# Patient Record
Sex: Female | Born: 1985 | Race: White | Hispanic: No | Marital: Married | Smoking: Never smoker
Health system: Southern US, Community
[De-identification: ages and names within clinical notes are randomized; demographics above are authoritative.]

## PROBLEM LIST (undated history)

## (undated) ENCOUNTER — Inpatient Hospital Stay (HOSPITAL_COMMUNITY): Payer: Self-pay

## (undated) DIAGNOSIS — R569 Unspecified convulsions: Secondary | ICD-10-CM

## (undated) DIAGNOSIS — Z91148 Patient's other noncompliance with medication regimen for other reason: Secondary | ICD-10-CM

## (undated) DIAGNOSIS — Z9114 Patient's other noncompliance with medication regimen: Secondary | ICD-10-CM

## (undated) DIAGNOSIS — I639 Cerebral infarction, unspecified: Secondary | ICD-10-CM

## (undated) DIAGNOSIS — E119 Type 2 diabetes mellitus without complications: Secondary | ICD-10-CM

## (undated) NOTE — *Deleted (*Deleted)
.   Code stroke called at 0925 by OB Anyanwu. Pt was last know well at 0900. She had witnessed change (slurred speech and rt sided weakness) at that time. She is one day post op C section.She is diabetic. SRN and RRRN to bedside at [phone]. Pt drowsy, but following commands. She is weak in the right side and speech is slightly slurred. Total NIHSS 6 see flowsheet for details. Pt taken to CT #1 at 0933 CBG obtained(118) Non contrast CT negative for acute hemorrhage per Dr Derry Lory.Pt is not a candidate for TPA due to surgery yesterday. At [phone] obtaining additional IV access and obtaining BP (145/90). MRI stat ordered instead of CTA due to pt's shellfish allergy. To MRI at 0950. Taken immediately into open scanner. At 1005 MRI was read by Dr Derry Lory to be negative for a stroke, and code stroke was cancelled.

---
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
## 2006-05-15 ENCOUNTER — Ambulatory Visit: Payer: Self-pay | Admitting: Family Medicine

## 2006-05-16 ENCOUNTER — Encounter (INDEPENDENT_AMBULATORY_CARE_PROVIDER_SITE_OTHER): Payer: Self-pay | Admitting: Family Medicine

## 2006-06-05 ENCOUNTER — Other Ambulatory Visit: Admission: RE | Admit: 2006-06-05 | Discharge: 2006-06-05 | Payer: Self-pay | Admitting: Family Medicine

## 2006-06-05 ENCOUNTER — Ambulatory Visit: Payer: Self-pay | Admitting: Family Medicine

## 2006-06-05 ENCOUNTER — Encounter (INDEPENDENT_AMBULATORY_CARE_PROVIDER_SITE_OTHER): Payer: Self-pay | Admitting: Family Medicine

## 2006-06-07 ENCOUNTER — Ambulatory Visit (HOSPITAL_COMMUNITY): Admission: RE | Admit: 2006-06-07 | Discharge: 2006-06-07 | Payer: Self-pay | Admitting: Family Medicine

## 2006-06-26 ENCOUNTER — Ambulatory Visit: Payer: Self-pay | Admitting: Family Medicine

## 2006-10-24 ENCOUNTER — Ambulatory Visit: Payer: Self-pay | Admitting: Family Medicine

## 2007-07-12 ENCOUNTER — Encounter (INDEPENDENT_AMBULATORY_CARE_PROVIDER_SITE_OTHER): Payer: Self-pay | Admitting: Family Medicine

## 2008-11-05 ENCOUNTER — Emergency Department (HOSPITAL_COMMUNITY): Admission: EM | Admit: 2008-11-05 | Discharge: 2008-11-05 | Payer: Self-pay | Admitting: Emergency Medicine

## 2009-03-13 ENCOUNTER — Emergency Department (HOSPITAL_COMMUNITY): Admission: EM | Admit: 2009-03-13 | Discharge: 2009-03-13 | Payer: Self-pay | Admitting: Emergency Medicine

## 2009-04-04 ENCOUNTER — Emergency Department (HOSPITAL_COMMUNITY): Admission: EM | Admit: 2009-04-04 | Discharge: 2009-04-05 | Payer: Self-pay | Admitting: Emergency Medicine

## 2009-04-20 ENCOUNTER — Emergency Department (HOSPITAL_COMMUNITY): Admission: EM | Admit: 2009-04-20 | Discharge: 2009-04-20 | Payer: Self-pay | Admitting: Family Medicine

## 2009-12-09 ENCOUNTER — Emergency Department (HOSPITAL_COMMUNITY): Admission: EM | Admit: 2009-12-09 | Discharge: 2009-12-09 | Payer: Self-pay | Admitting: Emergency Medicine

## 2010-05-30 ENCOUNTER — Emergency Department (HOSPITAL_COMMUNITY): Admission: EM | Admit: 2010-05-30 | Discharge: 2010-05-30 | Payer: Self-pay | Admitting: Emergency Medicine

## 2010-08-28 ENCOUNTER — Emergency Department (HOSPITAL_COMMUNITY): Admission: EM | Admit: 2010-08-28 | Discharge: 2010-08-28 | Payer: Self-pay | Admitting: Emergency Medicine

## 2010-09-05 ENCOUNTER — Emergency Department (HOSPITAL_COMMUNITY): Admission: EM | Admit: 2010-09-05 | Discharge: 2010-09-05 | Payer: Self-pay

## 2010-11-18 ENCOUNTER — Emergency Department (HOSPITAL_COMMUNITY)
Admission: EM | Admit: 2010-11-18 | Discharge: 2010-11-18 | Disposition: A | Discharge status: Home / Self Care | Payer: Medicaid Other | Attending: Emergency Medicine | Admitting: Emergency Medicine

## 2010-11-18 DIAGNOSIS — N898 Other specified noninflammatory disorders of vagina: Secondary | ICD-10-CM | POA: Insufficient documentation

## 2010-11-18 DIAGNOSIS — R109 Unspecified abdominal pain: Secondary | ICD-10-CM | POA: Insufficient documentation

## 2010-11-18 DIAGNOSIS — R300 Dysuria: Secondary | ICD-10-CM | POA: Insufficient documentation

## 2010-11-18 DIAGNOSIS — B373 Candidiasis of vulva and vagina: Secondary | ICD-10-CM | POA: Insufficient documentation

## 2010-11-18 DIAGNOSIS — R350 Frequency of micturition: Secondary | ICD-10-CM | POA: Insufficient documentation

## 2010-11-18 DIAGNOSIS — E119 Type 2 diabetes mellitus without complications: Secondary | ICD-10-CM | POA: Insufficient documentation

## 2010-11-18 DIAGNOSIS — B3731 Acute candidiasis of vulva and vagina: Secondary | ICD-10-CM | POA: Insufficient documentation

## 2010-11-18 LAB — POCT I-STAT, CHEM 8
Creatinine, Ser: 0.6 mg/dL (ref 0.4–1.2)
Glucose, Bld: 264 mg/dL — ABNORMAL HIGH (ref 70–99)
Hemoglobin: 10.9 g/dL — ABNORMAL LOW (ref 12.0–15.0)
TCO2: 25 mmol/L (ref 0–100)

## 2010-11-18 LAB — WET PREP, GENITAL
Trich, Wet Prep: NONE SEEN
Yeast Wet Prep HPF POC: NONE SEEN

## 2010-11-18 LAB — URINE MICROSCOPIC-ADD ON

## 2010-11-18 LAB — URINALYSIS, ROUTINE W REFLEX MICROSCOPIC
Bilirubin Urine: NEGATIVE
Ketones, ur: NEGATIVE mg/dL
Nitrite: NEGATIVE
Specific Gravity, Urine: 1.024 (ref 1.005–1.030)
pH: 5.5 (ref 5.0–8.0)

## 2010-11-19 LAB — GC/CHLAMYDIA PROBE AMP, GENITAL
Chlamydia, DNA Probe: NEGATIVE
GC Probe Amp, Genital: NEGATIVE

## 2010-12-27 LAB — URINE MICROSCOPIC-ADD ON

## 2010-12-27 LAB — DIFFERENTIAL
Basophils Absolute: 0 10*3/uL (ref 0.0–0.1)
Lymphs Abs: 1 10*3/uL (ref 0.7–4.0)
Monocytes Absolute: 0.6 10*3/uL (ref 0.1–1.0)
Monocytes Relative: 8 % (ref 3–12)
Neutrophils Relative %: 78 % — ABNORMAL HIGH (ref 43–77)

## 2010-12-27 LAB — CBC
Hemoglobin: 9.6 g/dL — ABNORMAL LOW (ref 12.0–15.0)
MCH: 26.7 pg (ref 26.0–34.0)
WBC: 8.5 10*3/uL (ref 4.0–10.5)

## 2010-12-27 LAB — URINALYSIS, ROUTINE W REFLEX MICROSCOPIC
Bilirubin Urine: NEGATIVE
Protein, ur: NEGATIVE mg/dL

## 2010-12-27 LAB — POCT I-STAT, CHEM 8
BUN: 4 mg/dL — ABNORMAL LOW (ref 6–23)
Calcium, Ion: 1.16 mmol/L (ref 1.12–1.32)
HCT: 29 % — ABNORMAL LOW (ref 36.0–46.0)
Sodium: 135 mEq/L (ref 135–145)

## 2010-12-27 LAB — POCT PREGNANCY, URINE: Preg Test, Ur: NEGATIVE

## 2010-12-27 LAB — RAPID STREP SCREEN (MED CTR MEBANE ONLY): Streptococcus, Group A Screen (Direct): NEGATIVE

## 2010-12-29 LAB — CBC
HCT: 33.7 % — ABNORMAL LOW (ref 36.0–46.0)
Hemoglobin: 11.6 g/dL — ABNORMAL LOW (ref 12.0–15.0)
MCV: 78.2 fL (ref 78.0–100.0)
Platelets: 198 10*3/uL (ref 150–400)
RBC: 4.31 MIL/uL (ref 3.87–5.11)
RDW: 15.4 % (ref 11.5–15.5)
WBC: 5.1 10*3/uL (ref 4.0–10.5)

## 2010-12-29 LAB — DIFFERENTIAL
Basophils Absolute: 0.1 10*3/uL (ref 0.0–0.1)
Eosinophils Relative: 6 % — ABNORMAL HIGH (ref 0–5)
Lymphs Abs: 1.4 10*3/uL (ref 0.7–4.0)
Neutro Abs: 2.8 10*3/uL (ref 1.7–7.7)
Neutrophils Relative %: 55 % (ref 43–77)

## 2010-12-29 LAB — COMPREHENSIVE METABOLIC PANEL
ALT: 32 U/L (ref 0–35)
Alkaline Phosphatase: 77 U/L (ref 39–117)
Chloride: 104 mEq/L (ref 96–112)
GFR calc Af Amer: >60 mL/min (ref >60)
Glucose, Bld: 235 mg/dL — ABNORMAL HIGH (ref 70–99)
Potassium: 3.8 mEq/L (ref 3.5–5.1)
Sodium: 135 mEq/L (ref 135–145)
Total Protein: 7.4 g/dL (ref 6.0–8.3)

## 2010-12-29 LAB — URINALYSIS, ROUTINE W REFLEX MICROSCOPIC
Hgb urine dipstick: NEGATIVE
Ketones, ur: NEGATIVE mg/dL
Specific Gravity, Urine: 1.029 (ref 1.005–1.030)

## 2010-12-29 LAB — URINE MICROSCOPIC-ADD ON

## 2011-01-04 LAB — URINALYSIS, ROUTINE W REFLEX MICROSCOPIC
Bilirubin Urine: NEGATIVE
Ketones, ur: NEGATIVE mg/dL
Nitrite: NEGATIVE
pH: 6 (ref 5.0–8.0)

## 2011-01-04 LAB — GLUCOSE, CAPILLARY: Glucose-Capillary: 259 mg/dL — ABNORMAL HIGH (ref 70–99)

## 2011-01-24 ENCOUNTER — Emergency Department (HOSPITAL_COMMUNITY)
Admission: EM | Admit: 2011-01-24 | Discharge: 2011-01-25 | Disposition: A | Discharge status: Home / Self Care | Payer: Medicaid Other | Attending: Emergency Medicine | Admitting: Emergency Medicine

## 2011-01-24 ENCOUNTER — Emergency Department (HOSPITAL_COMMUNITY): Payer: Medicaid Other

## 2011-01-24 DIAGNOSIS — E1169 Type 2 diabetes mellitus with other specified complication: Secondary | ICD-10-CM | POA: Insufficient documentation

## 2011-01-24 DIAGNOSIS — R1011 Right upper quadrant pain: Secondary | ICD-10-CM | POA: Insufficient documentation

## 2011-01-24 DIAGNOSIS — E669 Obesity, unspecified: Secondary | ICD-10-CM | POA: Insufficient documentation

## 2011-01-24 DIAGNOSIS — R110 Nausea: Secondary | ICD-10-CM | POA: Insufficient documentation

## 2011-01-24 DIAGNOSIS — R509 Fever, unspecified: Secondary | ICD-10-CM | POA: Insufficient documentation

## 2011-01-24 DIAGNOSIS — R161 Splenomegaly, not elsewhere classified: Secondary | ICD-10-CM | POA: Insufficient documentation

## 2011-01-24 LAB — URINE MICROSCOPIC-ADD ON

## 2011-01-24 LAB — CBC
HCT: 31.6 % — ABNORMAL LOW (ref 36.0–46.0)
Hemoglobin: 11.3 g/dL — ABNORMAL LOW (ref 12.0–15.0)
MCHC: 33.6 g/dL (ref 30.0–36.0)
MCV: 79.3 fL (ref 78.0–100.0)
Platelets: 195 10*3/uL (ref 150–400)
Platelets: 208 10*3/uL (ref 150–400)
RBC: 3.98 MIL/uL (ref 3.87–5.11)
RBC: 4.35 MIL/uL (ref 3.87–5.11)
WBC: 8.9 10*3/uL (ref 4.0–10.5)

## 2011-01-24 LAB — URINALYSIS, ROUTINE W REFLEX MICROSCOPIC
Bilirubin Urine: NEGATIVE
Bilirubin Urine: NEGATIVE
Glucose, UA: NEGATIVE mg/dL
Glucose, UA: >1000 mg/dL — AB
Hgb urine dipstick: NEGATIVE
Hgb urine dipstick: NEGATIVE
Ketones, ur: NEGATIVE mg/dL
Specific Gravity, Urine: 1.03 (ref 1.005–1.030)
pH: 5.5 (ref 5.0–8.0)
pH: 5.5 (ref 5.0–8.0)

## 2011-01-24 LAB — COMPREHENSIVE METABOLIC PANEL
ALT: 21 U/L (ref 0–35)
AST: 25 U/L (ref 0–37)
Albumin: 4.1 g/dL (ref 3.5–5.2)
CO2: 27 mEq/L (ref 19–32)
Calcium: 9.1 mg/dL (ref 8.4–10.5)
Chloride: 101 mEq/L (ref 96–112)
GFR calc Af Amer: >60 mL/min (ref >60)
Sodium: 136 mEq/L (ref 135–145)

## 2011-01-24 LAB — DIFFERENTIAL
Basophils Absolute: 0.1 10*3/uL (ref 0.0–0.1)
Basophils Relative: 1 % (ref 0–1)
Neutro Abs: 4.8 10*3/uL (ref 1.7–7.7)
Neutrophils Relative %: 67 % (ref 43–77)

## 2011-01-24 LAB — LIPASE, BLOOD: Lipase: 20 U/L (ref 11–59)

## 2011-01-24 LAB — BASIC METABOLIC PANEL
BUN: 6 mg/dL (ref 6–23)
Chloride: 106 mEq/L (ref 96–112)
Potassium: 3.7 mEq/L (ref 3.5–5.1)

## 2011-01-24 LAB — GLUCOSE, CAPILLARY: Glucose-Capillary: 202 mg/dL — ABNORMAL HIGH (ref 70–99)

## 2011-01-25 ENCOUNTER — Emergency Department (HOSPITAL_COMMUNITY): Payer: Medicaid Other

## 2011-01-25 LAB — POCT I-STAT 3, VENOUS BLOOD GAS (G3P V): pH, Ven: 7.394 — ABNORMAL HIGH (ref 7.250–7.300)

## 2011-01-25 LAB — GLUCOSE, CAPILLARY
Glucose-Capillary: 154 mg/dL — ABNORMAL HIGH (ref 70–99)
Glucose-Capillary: 159 mg/dL — ABNORMAL HIGH (ref 70–99)

## 2011-01-25 MED ORDER — IOHEXOL 300 MG/ML  SOLN
100.0000 mL | Freq: Once | INTRAMUSCULAR | Status: AC | PRN
  Administered 2011-01-25: 100 mL via INTRAVENOUS

## 2011-01-25 MED ORDER — TECHNETIUM TC 99M MEBROFENIN IV KIT
5.0000 | PACK | Freq: Once | INTRAVENOUS | Status: AC | PRN
Start: 2011-01-25 — End: 2011-01-25
  Administered 2011-01-25: 5.2 via INTRAVENOUS

## 2011-04-10 ENCOUNTER — Inpatient Hospital Stay (INDEPENDENT_AMBULATORY_CARE_PROVIDER_SITE_OTHER)
Admission: RE | Admit: 2011-04-10 | Discharge: 2011-04-10 | Disposition: A | Discharge status: Home / Self Care | Payer: Medicaid Other | Source: Ambulatory Visit | Attending: Family Medicine | Admitting: Family Medicine

## 2011-04-10 DIAGNOSIS — K219 Gastro-esophageal reflux disease without esophagitis: Secondary | ICD-10-CM

## 2011-04-28 ENCOUNTER — Emergency Department (HOSPITAL_COMMUNITY)
Admission: EM | Admit: 2011-04-28 | Discharge: 2011-04-29 | Disposition: A | Discharge status: Home / Self Care | Payer: Medicaid Other | Attending: Emergency Medicine | Admitting: Emergency Medicine

## 2011-04-28 DIAGNOSIS — E119 Type 2 diabetes mellitus without complications: Secondary | ICD-10-CM | POA: Insufficient documentation

## 2011-04-28 DIAGNOSIS — M79609 Pain in unspecified limb: Secondary | ICD-10-CM | POA: Insufficient documentation

## 2011-04-28 DIAGNOSIS — G8929 Other chronic pain: Secondary | ICD-10-CM | POA: Insufficient documentation

## 2011-04-28 DIAGNOSIS — R072 Precordial pain: Secondary | ICD-10-CM | POA: Insufficient documentation

## 2011-04-28 DIAGNOSIS — R10816 Epigastric abdominal tenderness: Secondary | ICD-10-CM | POA: Insufficient documentation

## 2011-04-28 DIAGNOSIS — R112 Nausea with vomiting, unspecified: Secondary | ICD-10-CM | POA: Insufficient documentation

## 2011-04-28 DIAGNOSIS — R1013 Epigastric pain: Secondary | ICD-10-CM | POA: Insufficient documentation

## 2011-04-28 LAB — URINE MICROSCOPIC-ADD ON

## 2011-04-28 LAB — POCT PREGNANCY, URINE: Preg Test, Ur: NEGATIVE

## 2011-04-28 LAB — URINALYSIS, ROUTINE W REFLEX MICROSCOPIC
Bilirubin Urine: NEGATIVE
Glucose, UA: >1000 mg/dL — AB
Leukocytes, UA: NEGATIVE
Nitrite: NEGATIVE
Specific Gravity, Urine: 1.04 — ABNORMAL HIGH (ref 1.005–1.030)
pH: 5.5 (ref 5.0–8.0)

## 2011-04-29 LAB — COMPREHENSIVE METABOLIC PANEL
ALT: 20 U/L (ref 0–35)
AST: 22 U/L (ref 0–37)
Albumin: 4.3 g/dL (ref 3.5–5.2)
Alkaline Phosphatase: 75 U/L (ref 39–117)
Chloride: 98 mEq/L (ref 96–112)
Potassium: 3.6 mEq/L (ref 3.5–5.1)
Total Bilirubin: 0.8 mg/dL (ref 0.3–1.2)

## 2011-04-29 LAB — DIFFERENTIAL
Basophils Absolute: 0.1 10*3/uL (ref 0.0–0.1)
Eosinophils Absolute: 0.3 10*3/uL (ref 0.0–0.7)
Eosinophils Relative: 4 % (ref 0–5)
Lymphocytes Relative: 26 % (ref 12–46)
Neutrophils Relative %: 58 % (ref 43–77)

## 2011-04-29 LAB — CBC
Platelets: 194 10*3/uL (ref 150–400)
RDW: 15 % (ref 11.5–15.5)
WBC: 6 10*3/uL (ref 4.0–10.5)

## 2012-09-03 ENCOUNTER — Emergency Department (INDEPENDENT_AMBULATORY_CARE_PROVIDER_SITE_OTHER)
Admission: EM | Admit: 2012-09-03 | Discharge: 2012-09-03 | Disposition: A | Discharge status: Home / Self Care | Payer: Self-pay | Source: Home / Self Care | Attending: Family Medicine | Admitting: Family Medicine

## 2012-09-03 ENCOUNTER — Encounter (HOSPITAL_COMMUNITY): Payer: Self-pay | Admitting: *Deleted

## 2012-09-03 DIAGNOSIS — K219 Gastro-esophageal reflux disease without esophagitis: Secondary | ICD-10-CM

## 2012-09-03 MED ORDER — GI COCKTAIL ~~LOC~~
30.0000 mL | Freq: Once | ORAL | Status: AC
  Administered 2012-09-03: 30 mL via ORAL

## 2012-09-03 MED ORDER — GI COCKTAIL ~~LOC~~
ORAL | Status: AC
  Filled 2012-09-03: qty 30

## 2012-09-03 MED ORDER — PANTOPRAZOLE SODIUM 20 MG PO TBEC: 20.0000 mg | DELAYED_RELEASE_TABLET | Freq: Every day | ORAL | Status: DC

## 2012-09-03 NOTE — ED Notes (Signed)
C/o headache x 2 mos. all the time.  She went to the ED and was told to take Tylenol and Ibuprofen.  C/o substernal chest pain onset today.  No SOB, or sweating but did have nausea today and vomited 3-4 times bright red blood just flecks of blood.

## 2012-09-03 NOTE — ED Provider Notes (Signed)
History     CSN: [id number]  Arrival date & time 09/03/12  1508   First MD Initiated Contact with Patient 09/03/12 1603      Chief Complaint  Patient presents with  . Headache    (Consider location/radiation/quality/duration/timing/severity/associated sxs/prior treatment) Patient is a 26 y.o. female presenting with vomiting. The history is provided by the patient.  Emesis  This is a new problem. The current episode started 6 to 12 hours ago. The problem occurs 2 to 4 times per day. The problem has been gradually improving. The emesis has an appearance of bright red blood. There has been no fever. Associated symptoms include abdominal pain and headaches. Pertinent negatives include no chills, no diarrhea and no fever.    History reviewed. No pertinent past medical history.  History reviewed. No pertinent past surgical history.  Family History  Problem Relation Age of Onset  . Diabetes Father   . Asthma Father   . COPD Father     History  Substance Use Topics  . Smoking status: Never Smoker   . Smokeless tobacco: Not on file  . Alcohol Use: No    OB History    Grav Para Term Preterm Abortions TAB SAB Ect Mult Living                  Review of Systems  Constitutional: Negative for fever and chills.  Gastrointestinal: Positive for vomiting and abdominal pain. Negative for diarrhea.  Neurological: Positive for headaches.    Allergies  Review of patient's allergies indicates no known allergies.  Home Medications   Current Outpatient Rx  Name  Route  Sig  Dispense  Refill  . ACETAMINOPHEN 325 MG PO TABS   Oral   Take 325 mg by mouth as needed.         . IBUPROFEN 200 MG PO TABS   Oral   Take 400 mg by mouth every 6 (six) hours as needed.         Marland Kitchen PANTOPRAZOLE SODIUM 20 MG PO TBEC   Oral   Take 1 tablet (20 mg total) by mouth daily.   30 tablet   1     BP 124/89  Pulse 79  Temp 98.1 F (36.7 C) (Oral)  Resp 20  SpO2 100%  LMP  08/29/2012  Physical Exam  Nursing note and vitals reviewed. Constitutional: She is oriented to person, place, and time. She appears well-developed and well-nourished.  HENT:  Head: Normocephalic.  Mouth/Throat: Oropharynx is clear and moist.  Neck: Normal range of motion. Neck supple.  Cardiovascular: Normal rate, regular rhythm, normal heart sounds and intact distal pulses.   Pulmonary/Chest: Breath sounds normal.  Abdominal: Soft. Bowel sounds are normal. She exhibits no mass. There is tenderness in the epigastric area. There is no rigidity, no rebound, no guarding and no CVA tenderness.    Lymphadenopathy:    She has no cervical adenopathy.  Neurological: She is alert and oriented to person, place, and time.  Skin: Skin is warm and dry.    ED Course  Procedures (including critical care time)  Labs Reviewed - No data to display No results found.   1. GERD (gastroesophageal reflux disease)       MDM          Linna Hoff, MD 09/03/12 1744

## 2013-01-03 ENCOUNTER — Emergency Department (HOSPITAL_COMMUNITY)
Admission: EM | Admit: 2013-01-03 | Discharge: 2013-01-03 | Disposition: A | Discharge status: Home / Self Care | Payer: Self-pay | Attending: Emergency Medicine | Admitting: Emergency Medicine

## 2013-01-03 ENCOUNTER — Emergency Department (HOSPITAL_COMMUNITY): Payer: Self-pay

## 2013-01-03 ENCOUNTER — Encounter (HOSPITAL_COMMUNITY): Payer: Self-pay | Admitting: Emergency Medicine

## 2013-01-03 ENCOUNTER — Emergency Department (INDEPENDENT_AMBULATORY_CARE_PROVIDER_SITE_OTHER)
Admission: EM | Admit: 2013-01-03 | Discharge: 2013-01-03 | Disposition: A | Discharge status: Home / Self Care | Payer: Self-pay | Source: Home / Self Care | Attending: Emergency Medicine | Admitting: Emergency Medicine

## 2013-01-03 DIAGNOSIS — R109 Unspecified abdominal pain: Secondary | ICD-10-CM

## 2013-01-03 DIAGNOSIS — R1031 Right lower quadrant pain: Secondary | ICD-10-CM | POA: Insufficient documentation

## 2013-01-03 DIAGNOSIS — R509 Fever, unspecified: Secondary | ICD-10-CM | POA: Insufficient documentation

## 2013-01-03 LAB — CBC WITH DIFFERENTIAL/PLATELET
Basophils Relative: 1 % (ref 0–1)
Eosinophils Absolute: 0.2 10*3/uL (ref 0.0–0.7)
Hemoglobin: 11.1 g/dL — ABNORMAL LOW (ref 12.0–15.0)
Lymphs Abs: 1.2 10*3/uL (ref 0.7–4.0)
MCH: 26.2 pg (ref 26.0–34.0)
MCHC: 34 g/dL (ref 30.0–36.0)
Monocytes Relative: 8 % (ref 3–12)
Neutro Abs: 3 10*3/uL (ref 1.7–7.7)
Neutrophils Relative %: 64 % (ref 43–77)
Platelets: 166 10*3/uL (ref 150–400)
RBC: 4.23 MIL/uL (ref 3.87–5.11)

## 2013-01-03 LAB — BASIC METABOLIC PANEL
Chloride: 101 mEq/L (ref 96–112)
GFR calc Af Amer: >90 mL/min (ref >90)
GFR calc non Af Amer: >90 mL/min (ref >90)
Potassium: 3.6 mEq/L (ref 3.5–5.1)
Sodium: 138 mEq/L (ref 135–145)

## 2013-01-03 LAB — POCT URINALYSIS DIP (DEVICE)
Glucose, UA: NEGATIVE mg/dL
Leukocytes, UA: NEGATIVE
Nitrite: NEGATIVE
pH: 5.5 (ref 5.0–8.0)

## 2013-01-03 LAB — POCT PREGNANCY, URINE: Preg Test, Ur: NEGATIVE

## 2013-01-03 MED ORDER — IOHEXOL 300 MG/ML  SOLN
100.0000 mL | Freq: Once | INTRAMUSCULAR | Status: AC | PRN
  Administered 2013-01-03: 100 mL via INTRAVENOUS

## 2013-01-03 MED ORDER — OXYCODONE-ACETAMINOPHEN 5-325 MG PO TABS: 2.0000 | ORAL_TABLET | ORAL | Status: DC | PRN

## 2013-01-03 MED ORDER — SODIUM CHLORIDE 0.9 % IV BOLUS (SEPSIS)
1000.0000 mL | Freq: Once | INTRAVENOUS | Status: AC
  Administered 2013-01-03: 1000 mL via INTRAVENOUS

## 2013-01-03 MED ORDER — IOHEXOL 300 MG/ML  SOLN
50.0000 mL | Freq: Once | INTRAMUSCULAR | Status: AC | PRN
  Administered 2013-01-03: 50 mL via ORAL

## 2013-01-03 MED ORDER — MORPHINE SULFATE 4 MG/ML IJ SOLN
4.0000 mg | Freq: Once | INTRAMUSCULAR | Status: AC
  Administered 2013-01-03: 4 mg via INTRAVENOUS
  Filled 2013-01-03: qty 1

## 2013-01-03 NOTE — ED Provider Notes (Signed)
History     CSN: [id number]  Arrival date & time 01/03/13  1644   First MD Initiated Contact with Patient 01/03/13 1852      Chief Complaint  Patient presents with  . Abdominal Pain    (Consider location/radiation/quality/duration/timing/severity/associated sxs/prior treatment) The history is provided by the patient.  Joann Meyers is a 27 y.o. female otherwise healthy here with ab pain. Lower quadrant pain for the last month that intermittent. It is occasionally sharp and stabbing pain that got worse today. Not worse with food denies any vomiting or diarrhea or urinary symptoms. She does have a fever 101 this morning and went to urgent care. At the urgent care she had a negative pregnancy test a normal UA was sent in for rule out appendicitis. She is not currently sexually active and no vaginal complaints. No history of ovarian cysts.    History reviewed. No pertinent past medical history.  History reviewed. No pertinent past surgical history.  Family History  Problem Relation Age of Onset  . Diabetes Father   . Asthma Father   . COPD Father     History  Substance Use Topics  . Smoking status: Never Smoker   . Smokeless tobacco: Not on file  . Alcohol Use: No    OB History   Grav Para Term Preterm Abortions TAB SAB Ect Mult Living                  Review of Systems  Gastrointestinal: Positive for abdominal pain.  All other systems reviewed and are negative.    Allergies  Review of patient's allergies indicates no known allergies.  Home Medications   Current Outpatient Rx  Name  Route  Sig  Dispense  Refill  . acetaminophen (TYLENOL) 325 MG tablet   Oral   Take 325 mg by mouth every 6 (six) hours as needed for pain.          . Naproxen Sodium (ALEVE PO)   Oral   Take 2 tablets by mouth 2 (two) times daily as needed (for pain).            BP 109/63  Pulse 78  Temp(Src) 98.1 F (36.7 C) (Oral)  Resp 14  Ht 5\' 1"  (1.549 m)  Wt 200 lb (90.719  kg)  BMI 37.81 kg/m2  SpO2 99%  LMP 12/09/2012  Physical Exam  Nursing note and vitals reviewed. Constitutional: She is oriented to person, place, and time. She appears well-developed and well-nourished.  Obese, uncomfortable   HENT:  Head: Normocephalic.  Mouth/Throat: Oropharynx is clear and moist.  Eyes: Conjunctivae are normal. Pupils are equal, round, and reactive to light.  Neck: Normal range of motion. Neck supple.  Cardiovascular: Normal rate, regular rhythm and normal heart sounds.   Pulmonary/Chest: Effort normal and breath sounds normal. No respiratory distress. She has no wheezes. She has no rales.  Abdominal: Soft. Bowel sounds are normal.  + RLQ tenderness. No rebound. No CVAT   Genitourinary:  She deferred since she is not sexually active   Musculoskeletal: Normal range of motion.  Neurological: She is alert and oriented to person, place, and time.  Skin: Skin is warm and dry.  Psychiatric: She has a normal mood and affect. Her behavior is normal. Judgment and thought content normal.    ED Course  Procedures (including critical care time)  Labs Reviewed  CBC WITH DIFFERENTIAL - Abnormal; Notable for the following:    Hemoglobin 11.1 (*)    HCT  32.6 (*)    MCV 77.1 (*)    All other components within normal limits  BASIC METABOLIC PANEL - Abnormal; Notable for the following:    Glucose, Bld 153 (*)    Creatinine, Ser 0.41 (*)    All other components within normal limits   Ct Abdomen Pelvis W Contrast  01/03/2013  *RADIOLOGY REPORT*  Clinical Data: Right-sided abdominal pain  CT ABDOMEN AND PELVIS WITH CONTRAST  Technique:  Multidetector CT imaging of the abdomen and pelvis was performed following the standard protocol during bolus administration of intravenous contrast.  Contrast: OMNIPAQUE IOHEXOL 300 MG/ML  SOLN  Comparison: 01/25/2011  Findings: Lung bases are predominately clear.  Heart size within normal limits.  Low attenuation of the liver suggests  fatty infiltration.  Focal hypoattenuation adjacent to the falciform ligament is favored to reflect focal fat or variant perfusion.  Otherwise, no focal hepatic lesion.  No biliary ductal dilatation.  No radiodense gallstones.  Splenomegaly with heterogeneous attenuation/hypodense lesions.  Unremarkable pancreas, adrenal glands, and kidneys.  No hydronephrosis or hydroureter.  No CT evidence for colitis.  Normal appendix.  Small bowel loops are normal course and caliber.  No free intraperitoneal air or fluid.  No lymphadenopathy.  Normal caliber aorta and branch vessels. Patent splenic vein.  Partially decompressed bladder.  Nonspecific appearance to the uterus.  No adnexal mass.  No acute osseous finding.  IMPRESSION: Splenomegaly and nonspecific ill-defined heterogeneous/hypodense foci within the spleen. The spleen does measure slightly larger than 2012.  The imaging appearance is nonspecific.  Recommend an MRI with contrast to attempt to further characterize.  Hepatic steatosis.   Original Report Authenticated By: Jearld Lesch, M.D.      No diagnosis found.    MDM  Joann Meyers is a 27 y.o. female here with RLQ pain and fever. Will need to r/o appendicitis. Will get CT ab/pel. Will get labs and reassess.     10:44 PM CT ab/pel showed no appendicitis. Some ill defined density in splen that needs outpatient workup. Not concerned for ovarian pathology. Will d/c home with pain meds and outpatient f/u.        Richardean Canal, MD 01/03/13 2245

## 2013-01-03 NOTE — ED Notes (Signed)
Patient instructed to put on gown for physician examination

## 2013-01-03 NOTE — ED Notes (Signed)
Requested urine specimen, patient reports recently urinating, unable to go now.  Stressed importance of the urine specimen.  Not offering po's due to right lower quadrant pain-will defer to physician.

## 2013-01-03 NOTE — ED Notes (Signed)
Low back pain for 3 days.  Fever started today, reports 102 at home.  Denies urinary symptoms.  , denies vaginal discharge.  Stabbing pain in right back, and pain in low right abdomen, this is stabbing pain , too

## 2013-01-03 NOTE — ED Notes (Signed)
Onset one month ago RLQ abdominal pain intermittent stabbing pain and recently pain worsening currently 10/10 sharp. Denies nausea emesis diarrhea or urinary complaints.

## 2013-01-03 NOTE — ED Provider Notes (Signed)
History     CSN: [id number]  Arrival date & time 01/03/13  1406   First MD Initiated Contact with Patient 01/03/13 1431      Chief Complaint  Patient presents with  . Back Pain    (Consider location/radiation/quality/duration/timing/severity/associated sxs/prior treatment) HPI Comments: Shouldn't presents urgent care this afternoon complaining that for the last 2-3 days she's been expressing lower back pain somewhat towards the right side of her back. And she reports that she had a temperature of 102 today. Patient denies any urinary symptoms such as increased frequency or pressure, den isies vaginal discharge. Patient is also being experiencing right lower quadrant pain since yesterday. No nausea vomiting or diarrhea as.   Patient is a 27 y.o. female presenting with back pain. The history is provided by the patient and the spouse.  Back Pain Location:  Lumbar spine Quality:  Stabbing Radiates to:  Does not radiate Pain severity:  Moderate Onset quality:  Gradual Timing:  Constant Progression:  Partially resolved Chronicity:  New Context: not jumping from heights and not occupational injury   Relieved by:  Nothing Worsened by:  Ambulation and bending Ineffective treatments:  None tried Associated symptoms: abdominal pain and fever   Associated symptoms: no headaches and no pelvic pain     History reviewed. No pertinent past medical history.  History reviewed. No pertinent past surgical history.  Family History  Problem Relation Age of Onset  . Diabetes Father   . Asthma Father   . COPD Father     History  Substance Use Topics  . Smoking status: Never Smoker   . Smokeless tobacco: Not on file  . Alcohol Use: No    OB History   Grav Para Term Preterm Abortions TAB SAB Ect Mult Living                  Review of Systems  Constitutional: Positive for fever, chills and activity change.  Gastrointestinal: Positive for abdominal pain. Negative for constipation,  blood in stool, abdominal distention and anal bleeding.  Genitourinary: Negative for frequency, flank pain and pelvic pain.  Musculoskeletal: Positive for back pain.  Skin: Negative for color change, pallor and wound.  Neurological: Negative for headaches.    Allergies  Review of patient's allergies indicates no known allergies.  Home Medications   Current Outpatient Rx  Name  Route  Sig  Dispense  Refill  . Naproxen Sodium (ALEVE PO)   Oral   Take by mouth.         Marland Kitchen acetaminophen (TYLENOL) 325 MG tablet   Oral   Take 325 mg by mouth as needed.         Marland Kitchen ibuprofen (ADVIL,MOTRIN) 200 MG tablet   Oral   Take 400 mg by mouth every 6 (six) hours as needed.         . pantoprazole (PROTONIX) 20 MG tablet   Oral   Take 1 tablet (20 mg total) by mouth daily.   30 tablet   1     BP 131/86  Pulse 85  Temp(Src) 98.1 F (36.7 C) (Oral)  Resp 16  SpO2 100%  LMP 12/09/2012  Physical Exam  Nursing note and vitals reviewed. Constitutional: Vital signs are normal. She appears well-developed.  Non-toxic appearance. She does not have a sickly appearance. She does not appear ill. She appears distressed.  HENT:  Mouth/Throat: No oropharyngeal exudate.  Neck: No JVD present.  Abdominal: Soft. She exhibits no distension and no mass. There  is no hepatosplenomegaly, splenomegaly or hepatomegaly. There is tenderness in the right lower quadrant. There is tenderness at McBurney's point. There is no rigidity, no rebound, no guarding and no CVA tenderness. Hernia confirmed negative in the left inguinal area.    Musculoskeletal: She exhibits no tenderness.  Lymphadenopathy:    She has no cervical adenopathy.  Neurological: She is alert.  Skin: No rash noted. No erythema.    ED Course  Procedures (including critical care time)  Labs Reviewed  POCT URINALYSIS DIP (DEVICE) - Abnormal; Notable for the following:    Bilirubin Urine SMALL (*)    Ketones, ur TRACE (*)    Protein, ur  30 (*)    All other components within normal limits  POCT PREGNANCY, URINE   No results found.   1. Abdominal pain       MDM  27 year old female presents urgent care with concerning abdominal pain- reproducible focal right lower quadrant. Patient reported fevers of 102 at home, urinalysis somewhat unremarkable and inconsistent with a urinary tract infection or ureteral lithiasis. We'll transfer to the emergency department (NPO)to be considered for further evaluation. Differential diagnosis includes- acute appendicitis or ovarian pathology        Jimmie Molly, MD 01/03/13 [phone]

## 2013-09-23 ENCOUNTER — Emergency Department (HOSPITAL_COMMUNITY): Payer: No Typology Code available for payment source

## 2013-09-23 ENCOUNTER — Emergency Department (HOSPITAL_COMMUNITY): Payer: Self-pay

## 2013-09-23 ENCOUNTER — Emergency Department (HOSPITAL_COMMUNITY)
Admission: EM | Admit: 2013-09-23 | Discharge: 2013-09-23 | Disposition: A | Discharge status: Home / Self Care | Payer: No Typology Code available for payment source | Attending: Emergency Medicine | Admitting: Emergency Medicine

## 2013-09-23 DIAGNOSIS — S161XXA Strain of muscle, fascia and tendon at neck level, initial encounter: Secondary | ICD-10-CM

## 2013-09-23 DIAGNOSIS — R4182 Altered mental status, unspecified: Secondary | ICD-10-CM | POA: Insufficient documentation

## 2013-09-23 DIAGNOSIS — S46909A Unspecified injury of unspecified muscle, fascia and tendon at shoulder and upper arm level, unspecified arm, initial encounter: Secondary | ICD-10-CM | POA: Insufficient documentation

## 2013-09-23 DIAGNOSIS — Y92410 Unspecified street and highway as the place of occurrence of the external cause: Secondary | ICD-10-CM | POA: Insufficient documentation

## 2013-09-23 DIAGNOSIS — IMO0002 Reserved for concepts with insufficient information to code with codable children: Secondary | ICD-10-CM | POA: Insufficient documentation

## 2013-09-23 DIAGNOSIS — M5412 Radiculopathy, cervical region: Secondary | ICD-10-CM | POA: Insufficient documentation

## 2013-09-23 DIAGNOSIS — S139XXA Sprain of joints and ligaments of unspecified parts of neck, initial encounter: Secondary | ICD-10-CM | POA: Insufficient documentation

## 2013-09-23 DIAGNOSIS — S4980XA Other specified injuries of shoulder and upper arm, unspecified arm, initial encounter: Secondary | ICD-10-CM | POA: Insufficient documentation

## 2013-09-23 DIAGNOSIS — Y9389 Activity, other specified: Secondary | ICD-10-CM | POA: Insufficient documentation

## 2013-09-23 MED ORDER — HYDROCODONE-ACETAMINOPHEN 5-325 MG PO TABS: 2.0000 | ORAL_TABLET | Freq: Four times a day (QID) | ORAL | Status: DC | PRN

## 2013-09-23 MED ORDER — MORPHINE SULFATE 4 MG/ML IJ SOLN
4.0000 mg | Freq: Once | INTRAMUSCULAR | Status: AC
  Administered 2013-09-23: 4 mg via INTRAMUSCULAR
  Filled 2013-09-23: qty 1

## 2013-09-23 MED ORDER — ONDANSETRON 4 MG PO TBDP
4.0000 mg | ORAL_TABLET | Freq: Once | ORAL | Status: AC
  Administered 2013-09-23: 4 mg via ORAL
  Filled 2013-09-23: qty 1

## 2013-09-23 MED ORDER — HYDROCODONE-ACETAMINOPHEN 5-325 MG PO TABS
2.0000 | ORAL_TABLET | Freq: Once | ORAL | Status: AC
  Administered 2013-09-23: 2 via ORAL
  Filled 2013-09-23: qty 2

## 2013-09-23 NOTE — ED Notes (Signed)
Transported to CT 
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
## 2013-09-23 NOTE — ED Notes (Signed)
MD at bedside. 
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
## 2013-09-23 NOTE — ED Notes (Signed)
Patient transported to CT 

## 2013-09-23 NOTE — ED Notes (Signed)
Patient transported to X-ray 
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
## 2014-02-23 ENCOUNTER — Emergency Department (HOSPITAL_COMMUNITY)
Admission: EM | Admit: 2014-02-23 | Discharge: 2014-02-23 | Disposition: A | Discharge status: Home / Self Care | Payer: No Typology Code available for payment source | Attending: Emergency Medicine | Admitting: Emergency Medicine

## 2014-02-23 ENCOUNTER — Encounter (HOSPITAL_COMMUNITY): Payer: Self-pay | Admitting: Emergency Medicine

## 2014-02-23 DIAGNOSIS — R110 Nausea: Secondary | ICD-10-CM

## 2014-02-23 DIAGNOSIS — R42 Dizziness and giddiness: Secondary | ICD-10-CM | POA: Insufficient documentation

## 2014-02-23 DIAGNOSIS — Z3202 Encounter for pregnancy test, result negative: Secondary | ICD-10-CM | POA: Insufficient documentation

## 2014-02-23 DIAGNOSIS — R1110 Vomiting, unspecified: Secondary | ICD-10-CM

## 2014-02-23 DIAGNOSIS — R112 Nausea with vomiting, unspecified: Secondary | ICD-10-CM | POA: Insufficient documentation

## 2014-02-23 LAB — URINALYSIS, ROUTINE W REFLEX MICROSCOPIC
Bilirubin Urine: NEGATIVE
Glucose, UA: NEGATIVE mg/dL
Hgb urine dipstick: NEGATIVE
Ketones, ur: NEGATIVE mg/dL
Nitrite: NEGATIVE
Protein, ur: NEGATIVE mg/dL
Specific Gravity, Urine: 1.018 (ref 1.005–1.030)
UROBILINOGEN UA: 1 mg/dL (ref 0.0–1.0)
pH: 6 (ref 5.0–8.0)

## 2014-02-23 LAB — COMPREHENSIVE METABOLIC PANEL
ALT: 15 U/L (ref 0–35)
AST: 18 U/L (ref 0–37)
Albumin: 4.3 g/dL (ref 3.5–5.2)
Alkaline Phosphatase: 75 U/L (ref 39–117)
BUN: 9 mg/dL (ref 6–23)
CALCIUM: 9.8 mg/dL (ref 8.4–10.5)
CO2: 27 mEq/L (ref 19–32)
Chloride: 99 mEq/L (ref 96–112)
Creatinine, Ser: 0.46 mg/dL — ABNORMAL LOW (ref 0.50–1.10)
GFR calc non Af Amer: >90 mL/min (ref >90)
GLUCOSE: 258 mg/dL — AB (ref 70–99)
Potassium: 3.7 mEq/L (ref 3.7–5.3)
Sodium: 138 mEq/L (ref 137–147)
Total Bilirubin: 0.7 mg/dL (ref 0.3–1.2)
Total Protein: 7.6 g/dL (ref 6.0–8.3)

## 2014-02-23 LAB — CBC WITH DIFFERENTIAL/PLATELET
Basophils Absolute: 0 10*3/uL (ref 0.0–0.1)
Basophils Relative: 0 % (ref 0–1)
EOS ABS: 0.1 10*3/uL (ref 0.0–0.7)
EOS PCT: 2 % (ref 0–5)
HCT: 32.3 % — ABNORMAL LOW (ref 36.0–46.0)
Hemoglobin: 10.7 g/dL — ABNORMAL LOW (ref 12.0–15.0)
LYMPHS ABS: 1 10*3/uL (ref 0.7–4.0)
Lymphocytes Relative: 16 % (ref 12–46)
MCH: 26.3 pg (ref 26.0–34.0)
MCHC: 33.1 g/dL (ref 30.0–36.0)
MCV: 79.4 fL (ref 78.0–100.0)
Monocytes Absolute: 0.4 10*3/uL (ref 0.1–1.0)
Monocytes Relative: 7 % (ref 3–12)
Neutro Abs: 4.4 10*3/uL (ref 1.7–7.7)
Neutrophils Relative %: 75 % (ref 43–77)
PLATELETS: 186 10*3/uL (ref 150–400)
RBC: 4.07 MIL/uL (ref 3.87–5.11)
RDW: 15 % (ref 11.5–15.5)
WBC: 5.9 10*3/uL (ref 4.0–10.5)

## 2014-02-23 LAB — RAPID URINE DRUG SCREEN, HOSP PERFORMED
Amphetamines: NOT DETECTED
Barbiturates: NOT DETECTED
Benzodiazepines: NOT DETECTED
COCAINE: NOT DETECTED
OPIATES: NOT DETECTED
Tetrahydrocannabinol: NOT DETECTED

## 2014-02-23 LAB — ACETAMINOPHEN LEVEL: Acetaminophen (Tylenol), Serum: <15 ug/mL (ref 10–30)

## 2014-02-23 LAB — POC URINE PREG, ED: Preg Test, Ur: NEGATIVE

## 2014-02-23 LAB — SALICYLATE LEVEL: Salicylate Lvl: <2 mg/dL — ABNORMAL LOW (ref 2.8–20.0)

## 2014-02-23 LAB — URINE MICROSCOPIC-ADD ON

## 2014-02-23 MED ORDER — PROMETHAZINE HCL 25 MG PO TABS: 25.0000 mg | ORAL_TABLET | Freq: Three times a day (TID) | ORAL | Status: DC | PRN

## 2014-02-23 MED ORDER — ONDANSETRON HCL 4 MG/2ML IJ SOLN
4.0000 mg | Freq: Once | INTRAMUSCULAR | Status: AC
  Administered 2014-02-23: 4 mg via INTRAVENOUS
  Filled 2014-02-23: qty 2

## 2014-02-23 MED ORDER — SODIUM CHLORIDE 0.9 % IV BOLUS (SEPSIS)
2000.0000 mL | Freq: Once | INTRAVENOUS | Status: AC
  Administered 2014-02-23: 2000 mL via INTRAVENOUS

## 2014-02-23 NOTE — Discharge Instructions (Signed)
Return here as needed.  Slowly increase your fluid intake, rest as much possible.

## 2014-02-23 NOTE — ED Provider Notes (Signed)
CSN: [id number]     Arrival date & time 02/23/14  1236 History   First MD Initiated Contact with Patient 02/23/14 1704     Chief Complaint  Patient presents with  . Nausea  . Ingestion     (Consider location/radiation/quality/duration/timing/severity/associated sxs/prior Treatment) HPI Patient presents to the emergency department with nausea and dizziness that started at work today.  The patient, states, that she started to feel bad and her friend gave her some medicine, but she was unsure of what this was the patient, states she started feeling worse after that the patient, states, that she's not had any chest pain, shortness of breath, vomiting, weakness, headache, back pain, dysuria, abdominal pain, fever or rash.  The patient, states, that she did not take any other medication.  Arrival.  Patient, states nothing seems make her condition, better or worse   History reviewed. No pertinent past medical history. History reviewed. No pertinent past surgical history. Family History  Problem Relation Age of Onset  . Diabetes Father   . Asthma Father   . COPD Father    History  Substance Use Topics  . Smoking status: Never Smoker   . Smokeless tobacco: Not on file  . Alcohol Use: No   OB History   Grav Para Term Preterm Abortions TAB SAB Ect Mult Living                 Review of Systems All other systems negative except as documented in the HPI. All pertinent positives and negatives as reviewed in the HPI.   Allergies  Review of patient's allergies indicates no known allergies.  Home Medications   Prior to Admission medications   Medication Sig Start Date End Date Taking? Authorizing Provider  acetaminophen (TYLENOL) 325 MG tablet Take 650 mg by mouth every 6 (six) hours as needed for moderate pain (stomach).    Yes Historical Provider, MD  HYDROcodone-acetaminophen (NORCO/VICODIN) 5-325 MG per tablet Take 2 tablets by mouth every 6 (six) hours as needed. 09/23/13  Yes Montine Circle, PA-C   BP 117/69  Pulse 67  Temp(Src) 98.2 F (36.8 C)  Resp 18  SpO2 100% Physical Exam  Nursing note and vitals reviewed. Constitutional: She is oriented to person, place, and time. She appears well-developed and well-nourished.  HENT:  Head: Normocephalic and atraumatic.  Mouth/Throat: Oropharynx is clear and moist.  Eyes: Pupils are equal, round, and reactive to light.  Neck: Normal range of motion. Neck supple.  Cardiovascular: Normal rate, regular rhythm and normal heart sounds.  Exam reveals no gallop and no friction rub.   No murmur heard. Pulmonary/Chest: Effort normal and breath sounds normal. No respiratory distress.  Abdominal: Soft. Bowel sounds are normal. She exhibits no distension. There is no tenderness. There is no rebound.  Neurological: She is alert and oriented to person, place, and time. She exhibits normal muscle tone. Coordination normal.  Skin: Skin is warm and dry.    ED Course  Procedures (including critical care time) Labs Review Labs Reviewed  CBC WITH DIFFERENTIAL - Abnormal; Notable for the following:    Hemoglobin 10.7 (*)    HCT 32.3 (*)    All other components within normal limits  COMPREHENSIVE METABOLIC PANEL - Abnormal; Notable for the following:    Glucose, Bld 258 (*)    Creatinine, Ser 0.46 (*)    All other components within normal limits  URINALYSIS, ROUTINE W REFLEX MICROSCOPIC - Abnormal; Notable for the following:    APPearance HAZY (*)  Leukocytes, UA SMALL (*)    All other components within normal limits  SALICYLATE LEVEL - Abnormal; Notable for the following:    Salicylate Lvl <2.2 (*)    All other components within normal limits  URINE MICROSCOPIC-ADD ON - Abnormal; Notable for the following:    Squamous Epithelial / LPF MANY (*)    Bacteria, UA FEW (*)    All other components within normal limits  URINE RAPID DRUG SCREEN (HOSP PERFORMED)  ACETAMINOPHEN LEVEL  POC URINE PREG, ED      Patient is feeling  improved following IV fluids and antiemetics.  Patient be discharged home with medications for nausea.  Told to return here as needed.  I feel the patient has most likely a mild GI illness: The followup with her primary care Dr.  Brent General, PA-C 02/23/14 2039

## 2014-02-23 NOTE — ED Notes (Signed)
States was at work t this and started to feel bad and a friend gave her some meds  She did not know what it wwas and now she feels worse

## 2014-02-23 NOTE — ED Notes (Signed)
Apologized to pt for delay. Pt made aware of triage process. Pt reminded of need of urine sample.

## 2014-02-23 NOTE — ED Notes (Signed)
Pt given sprite per Gerald Stabs, [place] approval. Will monitor to see how pt tolerates.

## 2014-02-24 NOTE — ED Provider Notes (Signed)
  Medical screening examination/treatment/procedure(s) were performed by non-physician practitioner and as supervising physician I was immediately available for consultation/collaboration.   EKG Interpretation None         Carmin Muskrat, MD 02/24/14 [phone]

## 2014-04-29 ENCOUNTER — Emergency Department (HOSPITAL_COMMUNITY)
Admission: EM | Admit: 2014-04-29 | Discharge: 2014-04-29 | Disposition: A | Discharge status: Home / Self Care | Payer: Self-pay | Attending: Emergency Medicine | Admitting: Emergency Medicine

## 2014-04-29 ENCOUNTER — Emergency Department (HOSPITAL_COMMUNITY): Payer: Self-pay

## 2014-04-29 ENCOUNTER — Encounter (HOSPITAL_COMMUNITY): Payer: Self-pay | Admitting: Emergency Medicine

## 2014-04-29 DIAGNOSIS — R109 Unspecified abdominal pain: Secondary | ICD-10-CM

## 2014-04-29 DIAGNOSIS — Z3202 Encounter for pregnancy test, result negative: Secondary | ICD-10-CM | POA: Insufficient documentation

## 2014-04-29 DIAGNOSIS — R1012 Left upper quadrant pain: Secondary | ICD-10-CM | POA: Insufficient documentation

## 2014-04-29 DIAGNOSIS — N898 Other specified noninflammatory disorders of vagina: Secondary | ICD-10-CM | POA: Insufficient documentation

## 2014-04-29 DIAGNOSIS — L259 Unspecified contact dermatitis, unspecified cause: Secondary | ICD-10-CM | POA: Insufficient documentation

## 2014-04-29 DIAGNOSIS — R1032 Left lower quadrant pain: Secondary | ICD-10-CM | POA: Insufficient documentation

## 2014-04-29 DIAGNOSIS — R1031 Right lower quadrant pain: Secondary | ICD-10-CM | POA: Insufficient documentation

## 2014-04-29 DIAGNOSIS — R112 Nausea with vomiting, unspecified: Secondary | ICD-10-CM | POA: Insufficient documentation

## 2014-04-29 DIAGNOSIS — Z791 Long term (current) use of non-steroidal anti-inflammatories (NSAID): Secondary | ICD-10-CM | POA: Insufficient documentation

## 2014-04-29 LAB — URINE MICROSCOPIC-ADD ON

## 2014-04-29 LAB — URINALYSIS, ROUTINE W REFLEX MICROSCOPIC
Bilirubin Urine: NEGATIVE
Glucose, UA: >1000 mg/dL — AB
KETONES UR: 15 mg/dL — AB
Leukocytes, UA: NEGATIVE
Nitrite: NEGATIVE
PROTEIN: 30 mg/dL — AB
Specific Gravity, Urine: 1.031 — ABNORMAL HIGH (ref 1.005–1.030)
Urobilinogen, UA: 0.2 mg/dL (ref 0.0–1.0)
pH: 5 (ref 5.0–8.0)

## 2014-04-29 LAB — CBC WITH DIFFERENTIAL/PLATELET
BASOS ABS: 0 10*3/uL (ref 0.0–0.1)
BASOS PCT: 0 % (ref 0–1)
EOS ABS: 0 10*3/uL (ref 0.0–0.7)
Eosinophils Relative: 1 % (ref 0–5)
HCT: 30.8 % — ABNORMAL LOW (ref 36.0–46.0)
Hemoglobin: 10.3 g/dL — ABNORMAL LOW (ref 12.0–15.0)
Lymphocytes Relative: 6 % — ABNORMAL LOW (ref 12–46)
Lymphs Abs: 0.5 10*3/uL — ABNORMAL LOW (ref 0.7–4.0)
MCH: 26.1 pg (ref 26.0–34.0)
MCHC: 33.4 g/dL (ref 30.0–36.0)
MCV: 78.2 fL (ref 78.0–100.0)
MONO ABS: 0.3 10*3/uL (ref 0.1–1.0)
Monocytes Relative: 4 % (ref 3–12)
NEUTROS ABS: 7.3 10*3/uL (ref 1.7–7.7)
Neutrophils Relative %: 89 % — ABNORMAL HIGH (ref 43–77)
Platelets: 184 10*3/uL (ref 150–400)
RBC: 3.94 MIL/uL (ref 3.87–5.11)
RDW: 14.9 % (ref 11.5–15.5)
WBC: 8.2 10*3/uL (ref 4.0–10.5)

## 2014-04-29 LAB — COMPREHENSIVE METABOLIC PANEL
ALK PHOS: 69 U/L (ref 39–117)
ALT: 14 U/L (ref 0–35)
ANION GAP: 16 — AB (ref 5–15)
AST: 15 U/L (ref 0–37)
Albumin: 4.2 g/dL (ref 3.5–5.2)
BUN: 11 mg/dL (ref 6–23)
CALCIUM: 9.4 mg/dL (ref 8.4–10.5)
CHLORIDE: 101 meq/L (ref 96–112)
CO2: 24 mEq/L (ref 19–32)
CREATININE: 0.43 mg/dL — AB (ref 0.50–1.10)
Glucose, Bld: 316 mg/dL — ABNORMAL HIGH (ref 70–99)
Potassium: 4.1 mEq/L (ref 3.7–5.3)
Sodium: 141 mEq/L (ref 137–147)
Total Bilirubin: 0.7 mg/dL (ref 0.3–1.2)
Total Protein: 7.3 g/dL (ref 6.0–8.3)

## 2014-04-29 LAB — POC URINE PREG, ED: Preg Test, Ur: NEGATIVE

## 2014-04-29 LAB — LIPASE, BLOOD: Lipase: 23 U/L (ref 11–59)

## 2014-04-29 MED ORDER — HYDROMORPHONE HCL PF 1 MG/ML IJ SOLN
1.0000 mg | Freq: Once | INTRAMUSCULAR | Status: AC
  Administered 2014-04-29: 1 mg via INTRAVENOUS
  Filled 2014-04-29: qty 1

## 2014-04-29 MED ORDER — PROMETHAZINE HCL 25 MG PO TABS: 25.0000 mg | ORAL_TABLET | Freq: Four times a day (QID) | ORAL | Status: DC | PRN

## 2014-04-29 MED ORDER — NAPROXEN 500 MG PO TABS: 500.0000 mg | ORAL_TABLET | Freq: Two times a day (BID) | ORAL | Status: DC

## 2014-04-29 MED ORDER — HYDROCODONE-ACETAMINOPHEN 5-325 MG PO TABS: 1.0000 | ORAL_TABLET | Freq: Four times a day (QID) | ORAL | Status: DC | PRN

## 2014-04-29 MED ORDER — SODIUM CHLORIDE 0.9 % IV SOLN
INTRAVENOUS | Status: DC
  Administered 2014-04-29: 09:00:00 via INTRAVENOUS

## 2014-04-29 MED ORDER — SODIUM CHLORIDE 0.9 % IV BOLUS (SEPSIS)
250.0000 mL | Freq: Once | INTRAVENOUS | Status: AC
  Administered 2014-04-29: 250 mL via INTRAVENOUS

## 2014-04-29 MED ORDER — ONDANSETRON HCL 4 MG/2ML IJ SOLN
4.0000 mg | Freq: Once | INTRAMUSCULAR | Status: AC
  Administered 2014-04-29: 4 mg via INTRAVENOUS
  Filled 2014-04-29: qty 2

## 2014-04-29 MED ORDER — IOHEXOL 300 MG/ML  SOLN
25.0000 mL | Freq: Once | INTRAMUSCULAR | Status: AC | PRN
  Administered 2014-04-29: 25 mL via ORAL

## 2014-04-29 MED ORDER — IOHEXOL 300 MG/ML  SOLN
80.0000 mL | Freq: Once | INTRAMUSCULAR | Status: AC | PRN
  Administered 2014-04-29: 80 mL via INTRAVENOUS

## 2014-04-29 MED ORDER — KETOROLAC TROMETHAMINE 30 MG/ML IJ SOLN
30.0000 mg | Freq: Once | INTRAMUSCULAR | Status: AC
  Administered 2014-04-29: 30 mg via INTRAVENOUS
  Filled 2014-04-29: qty 1

## 2014-04-29 NOTE — ED Notes (Signed)
Pt off unit to CT

## 2014-04-29 NOTE — Discharge Instructions (Signed)
Abdominal Pain, Women °Abdominal (stomach, pelvic, or belly) pain can be caused by many things. It is important to tell your doctor: °· The location of the pain. °· Does it come and go or is it present all the time? °· Are there things that start the pain (eating certain foods, exercise)? °· Are there other symptoms associated with the pain (fever, nausea, vomiting, diarrhea)? °All of this is helpful to know when trying to find the cause of the pain. °CAUSES  °· Stomach: virus or bacteria infection, or ulcer. °· Intestine: appendicitis (inflamed appendix), regional ileitis (Crohn's disease), ulcerative colitis (inflamed colon), irritable bowel syndrome, diverticulitis (inflamed diverticulum of the colon), or cancer of the stomach or intestine. °· Gallbladder disease or stones in the gallbladder. °· Kidney disease, kidney stones, or infection. °· Pancreas infection or cancer. °· Fibromyalgia (pain disorder). °· Diseases of the female organs: °¨ Uterus: fibroid (non-cancerous) tumors or infection. °¨ Fallopian tubes: infection or tubal pregnancy. °¨ Ovary: cysts or tumors. °¨ Pelvic adhesions (scar tissue). °¨ Endometriosis (uterus lining tissue growing in the pelvis and on the pelvic organs). °¨ Pelvic congestion syndrome (female organs filling up with blood just before the menstrual period). °¨ Pain with the menstrual period. °¨ Pain with ovulation (producing an egg). °¨ Pain with an IUD (intrauterine device, birth control) in the uterus. °¨ Cancer of the female organs. °· Functional pain (pain not caused by a disease, may improve without treatment). °· Psychological pain. °· Depression. °DIAGNOSIS  °Your doctor will decide the seriousness of your pain by doing an examination. °· Blood tests. °· X-rays. °· Ultrasound. °· CT scan (computed tomography, special type of X-ray). °· MRI (magnetic resonance imaging). °· Cultures, for infection. °· Barium enema (dye inserted in the large intestine, to better view it with  X-rays). °· Colonoscopy (looking in intestine with a lighted tube). °· Laparoscopy (minor surgery, looking in abdomen with a lighted tube). °· Major abdominal exploratory surgery (looking in abdomen with a large incision). °TREATMENT  °The treatment will depend on the cause of the pain.  °· Many cases can be observed and treated at home. °· Over-the-counter medicines recommended by your caregiver. °· Prescription medicine. °· Antibiotics, for infection. °· Birth control pills, for painful periods or for ovulation pain. °· Hormone treatment, for endometriosis. °· Nerve blocking injections. °· Physical therapy. °· Antidepressants. °· Counseling with a psychologist or psychiatrist. °· Minor or major surgery. °HOME CARE INSTRUCTIONS  °· Do not take laxatives, unless directed by your caregiver. °· Take over-the-counter pain medicine only if ordered by your caregiver. Do not take aspirin because it can cause an upset stomach or bleeding. °· Try a clear liquid diet (broth or water) as ordered by your caregiver. Slowly move to a bland diet, as tolerated, if the pain is related to the stomach or intestine. °· Have a thermometer and take your temperature several times a day, and record it. °· Bed rest and sleep, if it helps the pain. °· Avoid sexual intercourse, if it causes pain. °· Avoid stressful situations. °· Keep your follow-up appointments and tests, as your caregiver orders. °· If the pain does not go away with medicine or surgery, you may try: °¨ Acupuncture. °¨ Relaxation exercises (yoga, meditation). °¨ Group therapy. °¨ Counseling. °SEEK MEDICAL CARE IF:  °· You notice certain foods cause stomach pain. °· Your home care treatment is not helping your pain. °· You need stronger pain medicine. °· You want your IUD removed. °· You feel faint or   lightheaded.  You develop nausea and vomiting.  You develop a rash.  You are having side effects or an allergy to your medicine. SEEK IMMEDIATE MEDICAL CARE IF:   Your  pain does not go away or gets worse.  You have a fever.  Your pain is felt only in portions of the abdomen. The right side could possibly be appendicitis. The left lower portion of the abdomen could be colitis or diverticulitis.  You are passing blood in your stools (bright red or black tarry stools, with or without vomiting).  You have blood in your urine.  You develop chills, with or without a fever.  You pass out. MAKE SURE YOU:   Understand these instructions.  Will watch your condition.  Will get help right away if you are not doing well or get worse. Document Released: 07/30/2007 Document Revised: 12/25/2011 Document Reviewed: 08/19/2009 [facility] Patient Information 2015 [place], [place]. This information is not intended to replace advice given to you by your health care provider. Make sure you discuss any questions you have with your health care provider.  Take medications as directed. Return for any newer worse symptoms. Work note provided.

## 2014-04-29 NOTE — ED Notes (Signed)
Patient here from work with complaint of abdominal pain which began last night with nausea and vomiting. Patient took aleve last night and it relieved the pain slightly. This morning patient was working and the pain became worse and she vomited multiple times. Pain is right lower quadrant. Per EMS patient was complaining of blood being in emesis, but visualization revealed that it was pink tinged emesis rather than streaks or clots.

## 2014-04-29 NOTE — ED Notes (Signed)
Pt instructed to drink contrast

## 2014-04-29 NOTE — ED Notes (Signed)
Pt finished contrast, CT called.

## 2014-04-29 NOTE — ED Provider Notes (Signed)
CSN: [id number]     Arrival date & time 04/29/14  0703 History   First MD Initiated Contact with Patient 04/29/14 [phone]     Chief Complaint  Patient presents with  . Abdominal Pain  . Emesis     (Consider location/radiation/quality/duration/timing/severity/associated sxs/prior Treatment) Patient is a 28 y.o. female presenting with abdominal pain and vomiting. The history is provided by the patient.  Abdominal Pain Pain location:  LLQ, LUQ and RLQ Pain quality: sharp and stabbing   Pain radiates to:  Does not radiate Pain severity:  Severe Timing:  Intermittent Progression:  Worsening Relieved by:  Nothing Associated symptoms: nausea, vaginal bleeding and vomiting   Associated symptoms: no chest pain, no diarrhea, no dysuria, no fever and no shortness of breath   Emesis Associated symptoms: abdominal pain   Associated symptoms: no diarrhea and no headaches    patient brought in by EMS. Complaining of left lower quadrant abdominal pain right lower quadrant, pain left upper corner abdominal pain. Pain started last night started with vomiting this morning around 6. Mentions some red tinged but no vomiting a pool of blood. Patient has no diarrhea. Pain is 10 out of 10 and is intermittent in its stabbing and sharp in nature.  History reviewed. No pertinent past medical history. History reviewed. No pertinent past surgical history. Family History  Problem Relation Age of Onset  . Diabetes Father   . Asthma Father   . COPD Father    History  Substance Use Topics  . Smoking status: Never Smoker   . Smokeless tobacco: Not on file  . Alcohol Use: No   OB History   Grav Para Term Preterm Abortions TAB SAB Ect Mult Living                 Review of Systems  Constitutional: Negative for fever and appetite change.  HENT: Negative for congestion.   Eyes: Negative for visual disturbance.  Respiratory: Negative for shortness of breath.   Cardiovascular: Negative for chest pain.   Gastrointestinal: Positive for nausea, vomiting and abdominal pain. Negative for diarrhea.  Genitourinary: Positive for vaginal bleeding. Negative for dysuria.  Musculoskeletal: Negative for back pain.  Skin: Positive for rash.  Neurological: Negative for headaches.  Hematological: Does not bruise/bleed easily.  Psychiatric/Behavioral: Negative for confusion.      Allergies  Review of patient's allergies indicates no known allergies.  Home Medications   Prior to Admission medications   Medication Sig Start Date End Date Taking? Authorizing Provider  Ibuprofen (ADVIL PO) Take 4 tablets by mouth daily as needed (pain).    Yes Historical Provider, MD  HYDROcodone-acetaminophen (NORCO/VICODIN) 5-325 MG per tablet Take 1-2 tablets by mouth every 6 (six) hours as needed for moderate pain. 04/29/14   Fredia Sorrow, MD  naproxen (NAPROSYN) 500 MG tablet Take 1 tablet (500 mg total) by mouth 2 (two) times daily. 04/29/14   Fredia Sorrow, MD  promethazine (PHENERGAN) 25 MG tablet Take 1 tablet (25 mg total) by mouth every 6 (six) hours as needed for nausea or vomiting. 04/29/14   Fredia Sorrow, MD   BP 91/58  Pulse 52  Temp(Src) 98 F (36.7 C) (Oral)  Resp 20  Ht 5\' 1"  (1.549 m)  Wt 190 lb (86.183 kg)  BMI 35.92 kg/m2  SpO2 99%  LMP 04/28/2014 Physical Exam  Nursing note and vitals reviewed. Constitutional: She is oriented to person, place, and time. She appears well-developed and well-nourished. No distress.  HENT:  Head: Normocephalic and  atraumatic.  Mouth/Throat: Oropharynx is clear and moist.  Eyes: Conjunctivae and EOM are normal. Pupils are equal, round, and reactive to light.  Neck: Normal range of motion.  Cardiovascular: Normal rate, regular rhythm and normal heart sounds.   No murmur heard. Abdominal: Soft. Bowel sounds are normal. There is tenderness. There is no guarding.  Nontender to palpation left lower quadrant right lower quadrant and left upper quadrant. No  guarding.  Musculoskeletal: Normal range of motion. She exhibits no tenderness.  Neurological: She is alert and oriented to person, place, and time. No cranial nerve deficit. She exhibits normal muscle tone. Coordination normal.  Skin: Skin is warm. No rash noted.  Contact type dermatitis to both hands left hand mostly index finger. Right hand around the back of the from an index finger on the dorsum of the hand. All suggestive of a contact type dermatitis patient uses gloves at work probably latex related. Patient will change the type of clothes at work and discuss with her supervisor.    ED Course  Procedures (including critical care time) Labs Review Labs Reviewed  CBC WITH DIFFERENTIAL - Abnormal; Notable for the following:    Hemoglobin 10.3 (*)    HCT 30.8 (*)    Neutrophils Relative % 89 (*)    Lymphocytes Relative 6 (*)    Lymphs Abs 0.5 (*)    All other components within normal limits  COMPREHENSIVE METABOLIC PANEL - Abnormal; Notable for the following:    Glucose, Bld 316 (*)    Creatinine, Ser 0.43 (*)    Anion gap 16 (*)    All other components within normal limits  URINALYSIS, ROUTINE W REFLEX MICROSCOPIC - Abnormal; Notable for the following:    Color, Urine AMBER (*)    APPearance CLOUDY (*)    Specific Gravity, Urine 1.031 (*)    Glucose, UA >1000 (*)    Hgb urine dipstick LARGE (*)    Ketones, ur 15 (*)    Protein, ur 30 (*)    All other components within normal limits  URINE MICROSCOPIC-ADD ON - Abnormal; Notable for the following:    Squamous Epithelial / LPF FEW (*)    All other components within normal limits  URINE CULTURE  LIPASE, BLOOD  POC URINE PREG, ED   Results for orders placed during the hospital encounter of 04/29/14  CBC WITH DIFFERENTIAL      Result Value Ref Range   WBC 8.2  4.0 - 10.5 K/uL   RBC 3.94  3.87 - 5.11 MIL/uL   Hemoglobin 10.3 (*) 12.0 - 15.0 g/dL   HCT 30.8 (*) 36.0 - 46.0 %   MCV 78.2  78.0 - 100.0 fL   MCH 26.1  26.0 -  34.0 pg   MCHC 33.4  30.0 - 36.0 g/dL   RDW 14.9  11.5 - 15.5 %   Platelets 184  150 - 400 K/uL   Neutrophils Relative % 89 (*) 43 - 77 %   Neutro Abs 7.3  1.7 - 7.7 K/uL   Lymphocytes Relative 6 (*) 12 - 46 %   Lymphs Abs 0.5 (*) 0.7 - 4.0 K/uL   Monocytes Relative 4  3 - 12 %   Monocytes Absolute 0.3  0.1 - 1.0 K/uL   Eosinophils Relative 1  0 - 5 %   Eosinophils Absolute 0.0  0.0 - 0.7 K/uL   Basophils Relative 0  0 - 1 %   Basophils Absolute 0.0  0.0 - 0.1 K/uL  COMPREHENSIVE METABOLIC PANEL      Result Value Ref Range   Sodium 141  137 - 147 mEq/L   Potassium 4.1  3.7 - 5.3 mEq/L   Chloride 101  96 - 112 mEq/L   CO2 24  19 - 32 mEq/L   Glucose, Bld 316 (*) 70 - 99 mg/dL   BUN 11  6 - 23 mg/dL   Creatinine, Ser 0.43 (*) 0.50 - 1.10 mg/dL   Calcium 9.4  8.4 - 10.5 mg/dL   Total Protein 7.3  6.0 - 8.3 g/dL   Albumin 4.2  3.5 - 5.2 g/dL   AST 15  0 - 37 U/L   ALT 14  0 - 35 U/L   Alkaline Phosphatase 69  39 - 117 U/L   Total Bilirubin 0.7  0.3 - 1.2 mg/dL   GFR calc non Af Amer >90  >90 mL/min   GFR calc Af Amer >90  >90 mL/min   Anion gap 16 (*) 5 - 15  URINALYSIS, ROUTINE W REFLEX MICROSCOPIC      Result Value Ref Range   Color, Urine AMBER (*) YELLOW   APPearance CLOUDY (*) CLEAR   Specific Gravity, Urine 1.031 (*) 1.005 - 1.030   pH 5.0  5.0 - 8.0   Glucose, UA >1000 (*) NEGATIVE mg/dL   Hgb urine dipstick LARGE (*) NEGATIVE   Bilirubin Urine NEGATIVE  NEGATIVE   Ketones, ur 15 (*) NEGATIVE mg/dL   Protein, ur 30 (*) NEGATIVE mg/dL   Urobilinogen, UA 0.2  0.0 - 1.0 mg/dL   Nitrite NEGATIVE  NEGATIVE   Leukocytes, UA NEGATIVE  NEGATIVE  LIPASE, BLOOD      Result Value Ref Range   Lipase 23  11 - 59 U/L  URINE MICROSCOPIC-ADD ON      Result Value Ref Range   Squamous Epithelial / LPF FEW (*) RARE   WBC, UA 3-6  <3 WBC/hpf   RBC / HPF TOO NUMEROUS TO COUNT  <3 RBC/hpf   Bacteria, UA RARE  RARE   Urine-Other MUCOUS PRESENT    POC URINE PREG, ED      Result  Value Ref Range   Preg Test, Ur NEGATIVE  NEGATIVE   Results for orders placed during the hospital encounter of 04/29/14  CBC WITH DIFFERENTIAL      Result Value Ref Range   WBC 8.2  4.0 - 10.5 K/uL   RBC 3.94  3.87 - 5.11 MIL/uL   Hemoglobin 10.3 (*) 12.0 - 15.0 g/dL   HCT 30.8 (*) 36.0 - 46.0 %   MCV 78.2  78.0 - 100.0 fL   MCH 26.1  26.0 - 34.0 pg   MCHC 33.4  30.0 - 36.0 g/dL   RDW 14.9  11.5 - 15.5 %   Platelets 184  150 - 400 K/uL   Neutrophils Relative % 89 (*) 43 - 77 %   Neutro Abs 7.3  1.7 - 7.7 K/uL   Lymphocytes Relative 6 (*) 12 - 46 %   Lymphs Abs 0.5 (*) 0.7 - 4.0 K/uL   Monocytes Relative 4  3 - 12 %   Monocytes Absolute 0.3  0.1 - 1.0 K/uL   Eosinophils Relative 1  0 - 5 %   Eosinophils Absolute 0.0  0.0 - 0.7 K/uL   Basophils Relative 0  0 - 1 %   Basophils Absolute 0.0  0.0 - 0.1 K/uL  COMPREHENSIVE METABOLIC PANEL      Result Value Ref  Range   Sodium 141  137 - 147 mEq/L   Potassium 4.1  3.7 - 5.3 mEq/L   Chloride 101  96 - 112 mEq/L   CO2 24  19 - 32 mEq/L   Glucose, Bld 316 (*) 70 - 99 mg/dL   BUN 11  6 - 23 mg/dL   Creatinine, Ser 0.43 (*) 0.50 - 1.10 mg/dL   Calcium 9.4  8.4 - 10.5 mg/dL   Total Protein 7.3  6.0 - 8.3 g/dL   Albumin 4.2  3.5 - 5.2 g/dL   AST 15  0 - 37 U/L   ALT 14  0 - 35 U/L   Alkaline Phosphatase 69  39 - 117 U/L   Total Bilirubin 0.7  0.3 - 1.2 mg/dL   GFR calc non Af Amer >90  >90 mL/min   GFR calc Af Amer >90  >90 mL/min   Anion gap 16 (*) 5 - 15  URINALYSIS, ROUTINE W REFLEX MICROSCOPIC      Result Value Ref Range   Color, Urine AMBER (*) YELLOW   APPearance CLOUDY (*) CLEAR   Specific Gravity, Urine 1.031 (*) 1.005 - 1.030   pH 5.0  5.0 - 8.0   Glucose, UA >1000 (*) NEGATIVE mg/dL   Hgb urine dipstick LARGE (*) NEGATIVE   Bilirubin Urine NEGATIVE  NEGATIVE   Ketones, ur 15 (*) NEGATIVE mg/dL   Protein, ur 30 (*) NEGATIVE mg/dL   Urobilinogen, UA 0.2  0.0 - 1.0 mg/dL   Nitrite NEGATIVE  NEGATIVE   Leukocytes, UA  NEGATIVE  NEGATIVE  LIPASE, BLOOD      Result Value Ref Range   Lipase 23  11 - 59 U/L  URINE MICROSCOPIC-ADD ON      Result Value Ref Range   Squamous Epithelial / LPF FEW (*) RARE   WBC, UA 3-6  <3 WBC/hpf   RBC / HPF TOO NUMEROUS TO COUNT  <3 RBC/hpf   Bacteria, UA RARE  RARE   Urine-Other MUCOUS PRESENT    POC URINE PREG, ED      Result Value Ref Range   Preg Test, Ur NEGATIVE  NEGATIVE   Ct Abdomen Pelvis W Contrast  04/29/2014   CLINICAL DATA:  Abdominal pain with nausea and vomiting since last night. Pain in the right lower quadrant.  EXAM: CT ABDOMEN AND PELVIS WITH CONTRAST  TECHNIQUE: Multidetector CT imaging of the abdomen and pelvis was performed using the standard protocol following bolus administration of intravenous contrast.  CONTRAST:  10mL OMNIPAQUE IOHEXOL 300 MG/ML  SOLN  COMPARISON:  Abdominal pelvic CT 01/03/2013.  FINDINGS: Lung bases: There is new patchy atelectasis at both lung bases. There is chronic elevation of the right hemidiaphragm. No significant pleural or pericardial effusion is present.  Liver: There is stable focal fat superior the gallbladder and adjacent to the falciform ligament. No acute hepatic abnormalities are identified.  Gallbladder/Biliary System: Unremarkable. There is no gallbladder wall thickening or biliary dilatation.  Pancreas: Normal in appearance. There is no surrounding inflammatory change.  Spleen: The spleen is now normal in size. There is no residual focal splenic or perisplenic abnormality.  Adrenal glands:  Normal.  Kidneys/Ureters/Bladder: There is stable mild renal cortical lobularity. No focal renal lesion or hydronephrosis identified. The bladder appears normal.  Bowel: The stomach, small bowel and appendix appear normal. There are diverticular changes throughout the colon without significant wall thickening or surrounding inflammation.  Peritoneum:  No ascites or peritoneal nodularity identified.  Vascular structures:  No significant  vascular findings.  Lymph nodes: Small retroperitoneal, portacaval and pelvic lymph nodes are stable, not pathologically enlarged.  Reproductive organs: There is no adnexal mass. The uterus has a stable appearance with a configuration of the endometrium suggesting a septate uterus.  Abdominal wall:  No evidence of abdominal wall hernia or mass.  Musculoskeletal: No significant osseous findings. Scattered bone islands are stable.  IMPRESSION: 1. No acute abdominal pelvic findings.  No evidence of appendicitis. 2. Interval resolution of previously demonstrated splenomegaly and splenic heterogeneity. 3. 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   Electronically Signed   By: Camie Patience M.D.   On: 04/29/2014 11:07      Imaging Review Ct Abdomen Pelvis W Contrast  04/29/2014   CLINICAL DATA:  Abdominal pain with nausea and vomiting since last night. Pain in the right lower quadrant.  EXAM: CT ABDOMEN AND PELVIS WITH CONTRAST  TECHNIQUE: Multidetector CT imaging of the abdomen and pelvis was performed using the standard protocol following bolus administration of intravenous contrast.  CONTRAST:  80mL OMNIPAQUE IOHEXOL 300 MG/ML  SOLN  COMPARISON:  Abdominal pelvic CT 01/03/2013.  FINDINGS: Lung bases: There is new patchy atelectasis at both lung bases. There is chronic elevation of the right hemidiaphragm. No significant pleural or pericardial effusion is present.  Liver: There is stable focal fat superior the gallbladder and adjacent to the falciform ligament. No acute hepatic abnormalities are identified.  Gallbladder/Biliary System: Unremarkable. There is no gallbladder wall thickening or biliary dilatation.  Pancreas: Normal in appearance. There is no surrounding inflammatory change.  Spleen: The spleen is now normal in size. There is no residual focal splenic or perisplenic abnormality.  Adrenal glands:  Normal.  Kidneys/Ureters/Bladder: There is stable mild renal cortical lobularity. No focal renal lesion or hydronephrosis  identified. The bladder appears normal.  Bowel: The stomach, small bowel and appendix appear normal. There are diverticular changes throughout the colon without significant wall thickening or surrounding inflammation.  Peritoneum:  No ascites or peritoneal nodularity identified.  Vascular structures: No significant vascular findings.  Lymph nodes: Small retroperitoneal, portacaval and pelvic lymph nodes are stable, not pathologically enlarged.  Reproductive organs: There is no adnexal mass. The uterus has a stable appearance with a configuration of the endometrium suggesting a septate uterus.  Abdominal wall:  No evidence of abdominal wall hernia or mass.  Musculoskeletal: No significant osseous findings. Scattered bone islands are stable.  IMPRESSION: 1. No acute abdominal pelvic findings.  No evidence of appendicitis. 2. Interval resolution of previously demonstrated splenomegaly and splenic heterogeneity. 3. 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   Electronically Signed   By: Camie Patience M.D.   On: 04/29/2014 11:07     EKG Interpretation None      MDM   Final diagnoses:  Abdominal pain, unspecified abdominal location    The patient presented with abdominal pain predominantly lower cartilage but also left upper quadrant. Workup without any significant findings. Lots of hematuria the patient's on her menses. CT scan without any specific findings. Labs without any specific abnormalities. No evidence of urinary tract infection. No evidence of any abdominal inflammation. Suspect symptoms may be related to cramping from abdominal pain. Patient also does have a contact type dermatitis rash to both hands related to the club she is to wear at work we had a discussion about that. Patient's going to change gloves may have a latex allergy.    Fredia Sorrow, MD 04/29/14 [phone]

## 2014-04-29 NOTE — ED Notes (Signed)
  Pt states she did not like the pain medication. Pt states she is still in pain but is resting in bed. Notified MD

## 2014-04-30 LAB — URINE CULTURE: Colony Count: >=100000

## 2015-07-21 ENCOUNTER — Emergency Department (HOSPITAL_COMMUNITY)
Admission: EM | Admit: 2015-07-21 | Discharge: 2015-07-21 | Disposition: A | Discharge status: Home / Self Care | Payer: Self-pay | Attending: Emergency Medicine | Admitting: Emergency Medicine

## 2015-07-21 ENCOUNTER — Encounter (HOSPITAL_COMMUNITY): Payer: Self-pay | Admitting: *Deleted

## 2015-07-21 DIAGNOSIS — R739 Hyperglycemia, unspecified: Secondary | ICD-10-CM | POA: Insufficient documentation

## 2015-07-21 DIAGNOSIS — R200 Anesthesia of skin: Secondary | ICD-10-CM | POA: Insufficient documentation

## 2015-07-21 DIAGNOSIS — B49 Unspecified mycosis: Secondary | ICD-10-CM | POA: Insufficient documentation

## 2015-07-21 DIAGNOSIS — L03032 Cellulitis of left toe: Secondary | ICD-10-CM | POA: Insufficient documentation

## 2015-07-21 DIAGNOSIS — L03031 Cellulitis of right toe: Secondary | ICD-10-CM | POA: Insufficient documentation

## 2015-07-21 DIAGNOSIS — Z3202 Encounter for pregnancy test, result negative: Secondary | ICD-10-CM | POA: Insufficient documentation

## 2015-07-21 DIAGNOSIS — E669 Obesity, unspecified: Secondary | ICD-10-CM | POA: Insufficient documentation

## 2015-07-21 LAB — I-STAT CHEM 8, ED
BUN: 7 mg/dL (ref 6–20)
CHLORIDE: 99 mmol/L — AB (ref 101–111)
CREATININE: 0.4 mg/dL — AB (ref 0.44–1.00)
Calcium, Ion: 1.2 mmol/L (ref 1.12–1.23)
Glucose, Bld: 390 mg/dL — ABNORMAL HIGH (ref 65–99)
HEMATOCRIT: 35 % — AB (ref 36.0–46.0)
Hemoglobin: 11.9 g/dL — ABNORMAL LOW (ref 12.0–15.0)
POTASSIUM: 3.9 mmol/L (ref 3.5–5.1)
Sodium: 137 mmol/L (ref 135–145)
TCO2: 23 mmol/L (ref 0–100)

## 2015-07-21 LAB — CBG MONITORING, ED: Glucose-Capillary: 357 mg/dL — ABNORMAL HIGH (ref 65–99)

## 2015-07-21 LAB — POC URINE PREG, ED: Preg Test, Ur: NEGATIVE

## 2015-07-21 MED ORDER — CLOTRIMAZOLE 1 % EX CREA: TOPICAL_CREAM | CUTANEOUS | Status: DC

## 2015-07-21 MED ORDER — CEPHALEXIN 500 MG PO CAPS: 500.0000 mg | ORAL_CAPSULE | Freq: Four times a day (QID) | ORAL | Status: DC

## 2015-07-21 MED ORDER — METFORMIN HCL 500 MG PO TABS: 500.0000 mg | ORAL_TABLET | Freq: Two times a day (BID) | ORAL | Status: DC

## 2015-07-21 NOTE — ED Notes (Signed)
PT has an open wound to RT small toe.

## 2015-07-21 NOTE — Care Management Note (Signed)
Case Management Note  Patient Details  Name: Joann Meyers MRN: [id number] Date of Birth: [birth year]  Subjective/Objective:                  29 y.o. female who presents to the Emergency Department complaining of blisters to the right toes onset this morning. She reports associated numbness to the toes, bloody drainage from the blisters as well as similar blistering and erythema to the hands. She states she has been seen for the blisters to the hands and was told it was caused by an allergen.//Home with family.  Action/Plan: Follow for disposition needs.   Expected Discharge Date:      07/21/15            Expected Discharge Plan:  Home/Self Care  In-House Referral:  PCP / [facility]  Discharge planning Services  Follow-up appt scheduled, CM Consult  Post Acute Care Choice:  NA Choice offered to:  NA  DME Arranged:  N/A DME Agency:  NA  HH Arranged:  NA HH Agency:  NA  Status of Service:  Completed, signed off  Medicare Important Message Given:    Date Medicare IM Given:    Medicare IM give by:    Date Additional Medicare IM Given:    Additional Medicare Important Message give by:     If discussed at [place] of Stay Meetings, dates discussed:    Additional Comments: Ilisa Hayworth J. Clydene Laming, RN, BSN, [place] [phone] ED CM consulted regarding PCP establishment and insurance enrollment. Pt presented to [facility] ED today with toe pain. NCM met with pt at bedside; pt confirms not having access to f/u care with PCP or insurance coverage. Discussed with patient importance and benefits of establishing PCP, and not utilizing the ED for primary care needs. Pt verbalized understanding and is in agreement. Discussed other options, provided list of local  affordable PCPs.  Pt voiced interest in the [facility] and [facility].  NCM advised that [facility]  Internal Medicine providers are seeing pts at [facility]. Pt verbalized understanding. NCM set up appointment with Cammie Sickle, NP at 3:15.   Fuller Mandril, RN 07/21/2015, 3:44 PM

## 2015-07-21 NOTE — ED Provider Notes (Signed)
CSN: [id number]     Arrival date & time 07/21/15  1337 History  By signing my name below, I, Helane Gunther, attest that this documentation has been prepared under the direction and in the presence of [employer], PA-C. Electronically Signed: Helane Gunther, ED Scribe. 07/21/2015. 2:34 PM.     Chief Complaint  Patient presents with  . Toe Injury   The history is provided by the patient. No language interpreter was used.   HPI Comments: Joann Meyers is a 29 y.o. female who presents to the Emergency Department complaining of blisters to the right toes onset this morning. She reports associated numbness to the toes, bloody drainage from the blisters as well as similar blistering and erythema to the hands. She states she has been seen for the blisters to the hands and was told it was caused by an allergen. She notes her mother applied onion to the blisters with no relief. She denies any recent injury or wearing new shoes. 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  History reviewed. No pertinent past medical history. History reviewed. No pertinent past surgical history. Family History  Problem Relation Age of Onset  . Diabetes Father   . Asthma Father   . COPD Father    Social History  Substance Use Topics  . Smoking status: Never Smoker   . Smokeless tobacco: None  . Alcohol Use: No   OB History    No data available     Review of Systems  Constitutional: Negative for fever.  Skin: Positive for color change and rash.  Neurological: Positive for numbness.    Allergies  Review of patient's allergies indicates no known allergies.  Home Medications   Prior to Admission medications   Medication Sig Start Date End Date Taking? Authorizing Provider  cephALEXin (KEFLEX) 500 MG capsule Take 1 capsule (500 mg total) by mouth 4 (four) times  daily. 07/21/15   Taniesha Glanz, PA-C  clotrimazole (LOTRIMIN) 1 % cream Apply to affected area 2 times daily 07/21/15   Joycelyn Liska, PA-C  HYDROcodone-acetaminophen (NORCO/VICODIN) 5-325 MG per tablet Take 1-2 tablets by mouth every 6 (six) hours as needed for moderate pain. 04/29/14   Fredia Sorrow, MD  Ibuprofen (ADVIL PO) Take 4 tablets by mouth daily as needed (pain).     Historical Provider, MD  naproxen (NAPROSYN) 500 MG tablet Take 1 tablet (500 mg total) by mouth 2 (two) times daily. 04/29/14   Fredia Sorrow, MD  promethazine (PHENERGAN) 25 MG tablet Take 1 tablet (25 mg total) by mouth every 6 (six) hours as needed for nausea or vomiting. 04/29/14   Fredia Sorrow, MD   BP 142/99 mmHg  Pulse 91  Temp(Src) 97.8 F (36.6 C) (Oral)  Resp 18  SpO2 98%  LMP 07/12/2015 Physical Exam  Constitutional: She is oriented to person, place, and time. She appears well-developed and well-nourished.  Obese  HENT:  Head: Normocephalic.  Eyes: EOM are normal.  Neck: Normal range of motion.  Pulmonary/Chest: Effort normal.  Abdominal: She exhibits no distension.  Musculoskeletal: Normal range of motion.  Left medial great toe with erythema, scaly rash, purulent blister present. Tender to palpation. The rest of the toes nonerythematous. Full range of motion. Right fifth toe significantly swollen, erythematous, scaly, tender to palpation. pain with any range of motion  Neurological: She is  alert and oriented to person, place, and time.  Skin: Skin is warm and dry.  Several scaly erythematous plaques to bilateral hands with excoriations. No tenderness or drainage.  Psychiatric: She has a normal mood and affect.  Nursing note and vitals reviewed.   ED Course  Procedures  DIAGNOSTIC STUDIES: Oxygen Saturation is 98% on RA, normal by my interpretation.    COORDINATION OF CARE: 2:24 PM - Discussed plans to consult with attending physician and to order antibiotics. Pt advised of plan  for treatment and pt agrees.  Labs Review Labs Reviewed  CBG MONITORING, ED - Abnormal; Notable for the following:    Glucose-Capillary 357 (*)    All other components within normal limits  I-STAT CHEM 8, ED - Abnormal; Notable for the following:    Chloride 99 (*)    Creatinine, Ser 0.40 (*)    Glucose, Bld 390 (*)    Hemoglobin 11.9 (*)    HCT 35.0 (*)    All other components within normal limits  POC URINE PREG, ED    Imaging Review No results found. I have personally reviewed and evaluated these images and lab results as part of my medical decision-making.   EKG Interpretation None      MDM   Final diagnoses:  Cellulitis of fifth toe, right  Cellulitis of great toe, left  Fungal infection    Patient with infection to the right fifth toe, left first toe, several patchy rash areas to the hands. Discussed with Dr. Venora Maples who has seen patient as well, question bacterial versus fungal infection of the toes. Patient does work in a place where her feet are constantly wet. Advised to wear waterproof shoes. Will start on Keflex as well as into fungal topical cream. Will check blood sugar.  3:49 PM Patient's blood sugar is 390. Anion gap is 15. Patient denies any other complaints, except for some nausea and vomiting. Pregnancy test is negative. Will start on metformin. I spoke with a case manager who will come by and try to set up a follow-up appointment for patient.  4:15 PM Pt was given an apt for the 18th of the month. i discussed on her treatment plan. We discussed diet, exercise, compliance with medications.   Filed Vitals:   07/21/15 1414 07/21/15 1610  BP: 142/99 140/87  Pulse: 91 89  Temp: 97.8 F (36.6 C) 97.7 F (36.5 C)  TempSrc: Oral Oral  Resp: 18 18  SpO2: 98% 99%     I personally performed the services described in this documentation, which was scribed in my presence. The recorded information has been reviewed and is accurate.   Jeannett Senior,  PA-C 07/21/15 [place], MD 07/21/15 1714

## 2015-07-21 NOTE — Discharge Instructions (Signed)
Take keflex as prescribed until all gone for infection. Lotrimin topically twice a day. Metformin as prescribed. Watch your diet. Exercise. Follow up with primary care doctor as scheduled.    Cellulitis Cellulitis is an infection of the skin and the tissue beneath it. The infected area is usually red and tender. Cellulitis occurs most often in the arms and lower legs.  CAUSES  Cellulitis is caused by bacteria that enter the skin through cracks or cuts in the skin. The most common types of bacteria that cause cellulitis are staphylococci and streptococci. SIGNS AND SYMPTOMS   Redness and warmth.  Swelling.  Tenderness or pain.  Fever. DIAGNOSIS  Your health care provider can usually determine what is wrong based on a physical exam. Blood tests may also be done. TREATMENT  Treatment usually involves taking an antibiotic medicine. HOME CARE INSTRUCTIONS   Take your antibiotic medicine as directed by your health care provider. Finish the antibiotic even if you start to feel better.  Keep the infected arm or leg elevated to reduce swelling.  Apply a warm cloth to the affected area up to 4 times per day to relieve pain.  Take medicines only as directed by your health care provider.  Keep all follow-up visits as directed by your health care provider. SEEK MEDICAL CARE IF:   You notice red streaks coming from the infected area.  Your red area gets larger or turns dark in color.  Your bone or joint underneath the infected area becomes painful after the skin has healed.  Your infection returns in the same area or another area.  You notice a swollen bump in the infected area.  You develop new symptoms.  You have a fever. SEEK IMMEDIATE MEDICAL CARE IF:   You feel very sleepy.  You develop vomiting or diarrhea.  You have a general ill feeling (malaise) with muscle aches and pains.   This information is not intended to replace advice given to you by your health care provider.  Make sure you discuss any questions you have with your health care provider.   Diabetes Mellitus and Food It is important for you to manage your blood sugar (glucose) level. Your blood glucose level can be greatly affected by what you eat. Eating healthier foods in the appropriate amounts throughout the day at about the same time each day will help you control your blood glucose level. It can also help slow or prevent worsening of your diabetes mellitus. Healthy eating may even help you improve the level of your blood pressure and reach or maintain a healthy weight.  General recommendations for healthful eating and cooking habits include:  Eating meals and snacks regularly. Avoid going long periods of time without eating to lose weight.  Eating a diet that consists mainly of plant-based foods, such as fruits, vegetables, nuts, legumes, and whole grains.  Using low-heat cooking methods, such as baking, instead of high-heat cooking methods, such as deep frying. Work with your dietitian to make sure you understand how to use the Nutrition Facts information on food labels. HOW CAN FOOD AFFECT ME? Carbohydrates Carbohydrates affect your blood glucose level more than any other type of food. Your dietitian will help you determine how many carbohydrates to eat at each meal and teach you how to count carbohydrates. Counting carbohydrates is important to keep your blood glucose at a healthy level, especially if you are using insulin or taking certain medicines for diabetes mellitus. Alcohol Alcohol can cause sudden decreases in blood glucose (  hypoglycemia), especially if you use insulin or take certain medicines for diabetes mellitus. Hypoglycemia can be a life-threatening condition. Symptoms of hypoglycemia (sleepiness, dizziness, and disorientation) are similar to symptoms of having too much alcohol.  If your health care provider has given you approval to drink alcohol, do so in moderation and use the  following guidelines:  Women should not have more than one drink per day, and men should not have more than two drinks per day. One drink is equal to:  12 oz of beer.  5 oz of wine.  1 oz of hard liquor.  Do not drink on an empty stomach.  Keep yourself hydrated. Have water, diet soda, or unsweetened iced tea.  Regular soda, juice, and other mixers might contain a lot of carbohydrates and should be counted. WHAT FOODS ARE NOT RECOMMENDED? As you make food choices, it is important to remember that all foods are not the same. Some foods have fewer nutrients per serving than other foods, even though they might have the same number of calories or carbohydrates. It is difficult to get your body what it needs when you eat foods with fewer nutrients. Examples of foods that you should avoid that are high in calories and carbohydrates but low in nutrients include:  Trans fats (most processed foods list trans fats on the Nutrition Facts label).  Regular soda.  Juice.  Candy.  Sweets, such as cake, pie, doughnuts, and cookies.  Fried foods. WHAT FOODS CAN I EAT? Eat nutrient-rich foods, which will nourish your body and keep you healthy. The food you should eat also will depend on several factors, including:  The calories you need.  The medicines you take.  Your weight.  Your blood glucose level.  Your blood pressure level.  Your cholesterol level. You should eat a variety of foods, including:  Protein.  Lean cuts of meat.  Proteins low in saturated fats, such as fish, egg whites, and beans. Avoid processed meats.  Fruits and vegetables.  Fruits and vegetables that may help control blood glucose levels, such as apples, mangoes, and yams.  Dairy products.  Choose fat-free or low-fat dairy products, such as milk, yogurt, and cheese.  Grains, bread, pasta, and rice.  Choose whole grain products, such as multigrain bread, whole oats, and brown rice. These foods may help  control blood pressure.  Fats.  Foods containing healthful fats, such as nuts, avocado, olive oil, canola oil, and fish. DOES EVERYONE WITH DIABETES MELLITUS HAVE THE SAME MEAL PLAN? Because every person with diabetes mellitus is different, there is not one meal plan that works for everyone. It is very important that you meet with a dietitian who will help you create a meal plan that is just right for you.   This information is not intended to replace advice given to you by your health care provider. Make sure you discuss any questions you have with your health care provider.   Document Released: 06/29/2005 Document Revised: 10/23/2014 Document Reviewed: 08/29/2013 Elsevier Interactive Patient Education 2016 [employer] Released: 07/12/2005 Document Revised: 06/23/2015 Document Reviewed: 12/18/2011 Elsevier Interactive Patient Education 2016 [place]. Hyperglycemia Hyperglycemia occurs when the glucose (sugar) in your blood is too high. Hyperglycemia can happen for many reasons, but it most often happens to people who do not know they have diabetes or are not managing their diabetes properly.  CAUSES  Whether you have diabetes or not, there are other causes of hyperglycemia. Hyperglycemia can occur when you have diabetes,  but it can also occur in other situations that you might not be as aware of, such as: Diabetes  If you have diabetes and are having problems controlling your blood glucose, hyperglycemia could occur because of some of the following reasons:  Not following your meal plan.  Not taking your diabetes medications or not taking it properly.  Exercising less or doing less activity than you normally do.  Being sick. Pre-diabetes  This cannot be ignored. Before people develop Type 2 diabetes, they almost always have "pre-diabetes." This is when your blood glucose levels are higher than normal, but not yet high enough to be diagnosed as diabetes. Research has  shown that some long-term damage to the body, especially the heart and circulatory system, may already be occurring during pre-diabetes. If you take action to manage your blood glucose when you have pre-diabetes, you may delay or prevent Type 2 diabetes from developing. Stress  If you have diabetes, you may be "diet" controlled or on oral medications or insulin to control your diabetes. However, you may find that your blood glucose is higher than usual in the hospital whether you have diabetes or not. This is often referred to as "stress hyperglycemia." Stress can elevate your blood glucose. This happens because of hormones put out by the body during times of stress. If stress has been the cause of your high blood glucose, it can be followed regularly by your caregiver. That way he/she can make sure your hyperglycemia does not continue to get worse or progress to diabetes. Steroids  Steroids are medications that act on the infection fighting system (immune system) to block inflammation or infection. One side effect can be a rise in blood glucose. Most people can produce enough extra insulin to allow for this rise, but for those who cannot, steroids make blood glucose levels go even higher. It is not unusual for steroid treatments to "uncover" diabetes that is developing. It is not always possible to determine if the hyperglycemia will go away after the steroids are stopped. A special blood test called an A1c is sometimes done to determine if your blood glucose was elevated before the steroids were started. SYMPTOMS  Thirsty.  Frequent urination.  Dry mouth.  Blurred vision.  Tired or fatigue.  Weakness.  Sleepy.  Tingling in feet or leg. DIAGNOSIS  Diagnosis is made by monitoring blood glucose in one or all of the following ways:  A1c test. This is a chemical found in your blood.  Fingerstick blood glucose monitoring.  Laboratory results. TREATMENT  First, knowing the cause of the  hyperglycemia is important before the hyperglycemia can be treated. Treatment may include, but is not be limited to:  Education.  Change or adjustment in medications.  Change or adjustment in meal plan.  Treatment for an illness, infection, etc.  More frequent blood glucose monitoring.  Change in exercise plan.  Decreasing or stopping steroids.  Lifestyle changes. HOME CARE INSTRUCTIONS   Test your blood glucose as directed.  Exercise regularly. Your caregiver will give you instructions about exercise. Pre-diabetes or diabetes which comes on with stress is helped by exercising.  Eat wholesome, balanced meals. Eat often and at regular, fixed times. Your caregiver or nutritionist will give you a meal plan to guide your sugar intake.  Being at an ideal weight is important. If needed, losing as little as 10 to 15 pounds may help improve blood glucose levels. SEEK MEDICAL CARE IF:   You have questions about medicine, activity, or  diet.  You continue to have symptoms (problems such as increased thirst, urination, or weight gain). SEEK IMMEDIATE MEDICAL CARE IF:   You are vomiting or have diarrhea.  Your breath smells fruity.  You are breathing faster or slower.  You are very sleepy or incoherent.  You have numbness, tingling, or pain in your feet or hands.  You have chest pain.  Your symptoms get worse even though you have been following your caregiver's orders.  If you have any other questions or concerns.   This information is not intended to replace advice given to you by your health care provider. Make sure you discuss any questions you have with your health care provider.   Document Released: 03/28/2001 Document Revised: 12/25/2011 Document Reviewed: 06/08/2015 Elsevier Interactive Patient Education [employer].

## 2015-08-03 ENCOUNTER — Ambulatory Visit: Payer: Self-pay | Admitting: Family Medicine

## 2015-11-13 ENCOUNTER — Emergency Department (HOSPITAL_COMMUNITY): Payer: Self-pay

## 2015-11-13 ENCOUNTER — Emergency Department (HOSPITAL_COMMUNITY)
Admission: EM | Admit: 2015-11-13 | Discharge: 2015-11-13 | Disposition: A | Discharge status: Home / Self Care | Payer: Self-pay | Attending: Emergency Medicine | Admitting: Emergency Medicine

## 2015-11-13 ENCOUNTER — Encounter (HOSPITAL_COMMUNITY): Payer: Self-pay | Admitting: *Deleted

## 2015-11-13 DIAGNOSIS — E118 Type 2 diabetes mellitus with unspecified complications: Secondary | ICD-10-CM

## 2015-11-13 DIAGNOSIS — L03011 Cellulitis of right finger: Secondary | ICD-10-CM | POA: Insufficient documentation

## 2015-11-13 DIAGNOSIS — E1169 Type 2 diabetes mellitus with other specified complication: Secondary | ICD-10-CM | POA: Insufficient documentation

## 2015-11-13 LAB — BASIC METABOLIC PANEL
ANION GAP: 11 (ref 5–15)
BUN: 6 mg/dL (ref 6–20)
CO2: 25 mmol/L (ref 22–32)
Calcium: 9.2 mg/dL (ref 8.9–10.3)
Chloride: 104 mmol/L (ref 101–111)
Creatinine, Ser: 0.6 mg/dL (ref 0.44–1.00)
GFR calc Af Amer: >60 mL/min (ref >60)
GFR calc non Af Amer: >60 mL/min (ref >60)
Glucose, Bld: 288 mg/dL — ABNORMAL HIGH (ref 65–99)
POTASSIUM: 3.8 mmol/L (ref 3.5–5.1)
Sodium: 140 mmol/L (ref 135–145)

## 2015-11-13 LAB — CBC WITH DIFFERENTIAL/PLATELET
Basophils Absolute: 0 10*3/uL (ref 0.0–0.1)
Basophils Relative: 0 %
Eosinophils Absolute: 0.2 10*3/uL (ref 0.0–0.7)
Eosinophils Relative: 3 %
HEMATOCRIT: 29.8 % — AB (ref 36.0–46.0)
Hemoglobin: 9.8 g/dL — ABNORMAL LOW (ref 12.0–15.0)
LYMPHS ABS: 1.2 10*3/uL (ref 0.7–4.0)
LYMPHS PCT: 19 %
MCH: 26 pg (ref 26.0–34.0)
MCHC: 32.9 g/dL (ref 30.0–36.0)
MCV: 79 fL (ref 78.0–100.0)
Monocytes Absolute: 0.4 10*3/uL (ref 0.1–1.0)
Monocytes Relative: 6 %
Neutro Abs: 4.3 10*3/uL (ref 1.7–7.7)
Neutrophils Relative %: 72 %
Platelets: 180 10*3/uL (ref 150–400)
RBC: 3.77 MIL/uL — AB (ref 3.87–5.11)
RDW: 15.6 % — ABNORMAL HIGH (ref 11.5–15.5)
WBC: 6 10*3/uL (ref 4.0–10.5)

## 2015-11-13 LAB — CBG MONITORING, ED: Glucose-Capillary: 249 mg/dL — ABNORMAL HIGH (ref 65–99)

## 2015-11-13 MED ORDER — METFORMIN HCL 500 MG PO TABS: 500.0000 mg | ORAL_TABLET | Freq: Two times a day (BID) | ORAL | Status: DC

## 2015-11-13 MED ORDER — CLINDAMYCIN HCL 150 MG PO CAPS
300.0000 mg | ORAL_CAPSULE | Freq: Once | ORAL | Status: AC
  Administered 2015-11-13: 300 mg via ORAL
  Filled 2015-11-13: qty 2

## 2015-11-13 MED ORDER — CLINDAMYCIN HCL 150 MG PO CAPS: 300.0000 mg | ORAL_CAPSULE | Freq: Three times a day (TID) | ORAL | Status: DC

## 2015-11-13 MED ORDER — LIDOCAINE HCL (PF) 1 % IJ SOLN
10.0000 mL | Freq: Once | INTRAMUSCULAR | Status: AC
  Administered 2015-11-13: 10 mL via INTRADERMAL
  Filled 2015-11-13: qty 10

## 2015-11-13 NOTE — ED Notes (Signed)
Pt stable, ambulatory, states understanding of discharge instructions 

## 2015-11-13 NOTE — ED Provider Notes (Signed)
CSN: [id number]     Arrival date & time 11/13/15  1938 History   First MD Initiated Contact with Patient 11/13/15 2011     Chief Complaint  Patient presents with  . Hand Pain     (Consider location/radiation/quality/duration/timing/severity/associated sxs/prior Treatment) HPI Comments: 30 year old female with right finger pain. The patient reports 1 week of progressively worsening pain to her distal right [place]ie finger. No trauma. She has noticed a blister on the side of her finger and reports swelling of the entire finger. She reports a temperature of 103 yesterday. She has chronic problems with her skin on her hands. She denies any new soaps or products. She denies any skin changes anywhere else other than her hands. When asked about metformin, she states that she never refilled the prescription and is not currently on any treatment for diabetes.  Patient is a 30 y.o. female presenting with hand pain. The history is provided by the patient.  Hand Pain    History reviewed. No pertinent past medical history. History reviewed. No pertinent past surgical history. Family History  Problem Relation Age of Onset  . Diabetes Father   . Asthma Father   . COPD Father    Social History  Substance Use Topics  . Smoking status: Never Smoker   . Smokeless tobacco: None  . Alcohol Use: No   OB History    No data available     Review of Systems  10 Systems reviewed and are negative for acute change except as noted in the HPI.   Allergies  Review of patient's allergies indicates no known allergies.  Home Medications   Prior to Admission medications   Medication Sig Start Date End Date Taking? Authorizing Provider  Ibuprofen (MIDOL PO) Take 1 tablet by mouth daily as needed (menstrual cramps).   Yes Historical Provider, MD  naproxen sodium (ALEVE) 220 MG tablet Take 220 mg by mouth daily as needed (menstrual cramps).   Yes Historical Provider, MD  clindamycin (CLEOCIN) 150 MG capsule  Take 2 capsules (300 mg total) by mouth 3 (three) times daily. 11/13/15   Sharlett Iles, MD  clotrimazole (LOTRIMIN) 1 % cream Apply to affected area 2 times daily Patient not taking: Reported on 11/13/2015 07/21/15   Lahoma Rocker Kirichenko, PA-C  metFORMIN (GLUCOPHAGE) 500 MG tablet Take 1 tablet (500 mg total) by mouth 2 (two) times daily with a meal. 11/13/15   Sharlett Iles, MD   BP 123/79 mmHg  Pulse 89  Temp(Src) 98.5 F (36.9 C) (Oral)  Resp 18  SpO2 99%  LMP 11/08/2015 Physical Exam  Constitutional: She is oriented to person, place, and time. She appears well-developed and well-nourished. No distress.  HENT:  Head: Normocephalic and atraumatic.  Nose: Nose normal.  Eyes: Conjunctivae are normal.  Cardiovascular: Normal rate, regular rhythm and normal heart sounds.   No murmur heard. Pulmonary/Chest: Effort normal and breath sounds normal. No respiratory distress.  Abdominal: Soft. Bowel sounds are normal. She exhibits no distension. There is no tenderness.  Musculoskeletal:  Swelling of R 5th finger; erythema, peeling skin, and small amount of drainage on radial side of fingertip w/ swelling near edge of nail, TTP  Neurological: She is alert and oriented to person, place, and time.  Skin: Skin is warm.  Scattered areas of peeling skin and mild erythema on b/l hands  Psychiatric: She has a normal mood and affect. Judgment normal.  Nursing note and vitals reviewed.   ED Course  .Marland KitchenIncision and Drainage Date/Time:  11/13/2015 10:00 PM Performed by: Sharlett Iles Authorized by: Sharlett Iles Consent: Verbal consent obtained. Consent given by: patient Patient identity confirmed: verbally with patient Type: abscess Body area: upper extremity Location details: right small finger Anesthesia: local infiltration Local anesthetic: lidocaine 1% without epinephrine Anesthetic total: 7 ml Scalpel size: 15 Incision type: single straight Incision depth:  dermal Complexity: simple Drainage: bloody Drainage amount: scant Wound treatment: wound left open Packing material: 1/2 in gauze Patient tolerance: Patient tolerated the procedure well with no immediate complications   (including critical care time) Labs Review Labs Reviewed  CBC WITH DIFFERENTIAL/PLATELET - Abnormal; Notable for the following:    RBC 3.77 (*)    Hemoglobin 9.8 (*)    HCT 29.8 (*)    RDW 15.6 (*)    All other components within normal limits  BASIC METABOLIC PANEL - Abnormal; Notable for the following:    Glucose, Bld 288 (*)    All other components within normal limits  CBG MONITORING, ED - Abnormal; Notable for the following:    Glucose-Capillary 249 (*)    All other components within normal limits    Imaging Review Dg Finger Anika Shore Right  11/13/2015  CLINICAL DATA:  Initial evaluation for distal right Dung Salinger finger infection. EXAM: RIGHT Aubriauna Riner FINGER 2+V COMPARISON:  None. FINDINGS: There is focal soft tissue swelling at the distal aspect of the right fifth digit at the medial aspect of the right distal phalangeal tuft. Swelling extends around the base of the fingernail. No soft tissue emphysema. No radiopaque foreign body. No osseous erosion at the site of swelling to suggest osteomyelitis. Visualized joints are normal in appearance without evidence for erosive or degenerative arthropathy. No fracture or dislocation. Osseous mineralization normal. IMPRESSION: 1. Focal soft tissue swelling at the medial aspect of the right distal phalangeal tuft with extension into the base of the finger nail bed, suspicious for infection/cellulitis. No radiopaque foreign body or soft tissue emphysema. No osseous erosion to suggest acute osteomyelitis. 2. No acute osseous abnormality about the right fifth digit. Electronically Signed   By: Jeannine Boga M.D.   On: 11/13/2015 21:01   I have personally reviewed and evaluated these lab results as part of my medical  decision-making.   EKG Interpretation None     Medications  lidocaine (PF) (XYLOCAINE) 1 % injection 10 mL (10 mLs Intradermal Given 11/13/15 2028)  clindamycin (CLEOCIN) capsule 300 mg (300 mg Oral Given 11/13/15 2215)    MDM   Final diagnoses:  Cellulitis of right Vola Beneke finger  Type 2 diabetes mellitus with complication, without long-term current use of insulin (HCC)   Pt w/ 1 week of pain and swelling on distal right Massie Cogliano fingertip. On exam, she has an area of blistering on the radial side of her fingertip as well as an area of swelling adjacent to the fingernail, consistent with paronychia. Obtained plain film to rule out osteomyelitis given the patient's untreated diabetes. Plain films show soft tissue swelling without any evidence of osteomyelitis. Blood glucose 250 consistent with diabetes. Performed incision and drainage at bedside after digital nerve block; see procedure note for details. Scant amount of drainage noted, mostly bloody. I cleaned the wound and irrigated copiously. The patient has no proximal swelling or erythema to suggest flexor tenosynovitis. Gave a dose of clindamycin and provided with prescription for clindamycin as well as metformin. I instructed patient to return here in 2 days for wound reevaluation and I extensively reviewed return precautions including any signs of  worsening infection. Emphasized importance of establishing care with PCP for follow-up with diabetes. Patient voiced understanding of plan and was discharged in satisfactory condition.  Sharlett Iles, MD 11/14/15 [phone]

## 2015-11-13 NOTE — Discharge Instructions (Signed)
IT IS VERY IMPORTANT FOR YOU TO TAKE ANTIBIOTICS UNTIL FINISHED. IT IS VERY IMPORTANT TO START TAKING DIABETES MEDICATION (METFORMIN). PLEASE ESTABLISH CARE WITH A PRIMARY CARE DOCTOR AS SOON AS POSSIBLE.  PLEASE RETURN TO EMERGENCY DEPARTMENT IN 2 DAYS FOR WOUND RECHECK.  Cellulitis Cellulitis is an infection of the skin and the tissue beneath it. The infected area is usually red and tender. Cellulitis occurs most often in the arms and lower legs.  CAUSES  Cellulitis is caused by bacteria that enter the skin through cracks or cuts in the skin. The most common types of bacteria that cause cellulitis are staphylococci and streptococci. SIGNS AND SYMPTOMS   Redness and warmth.  Swelling.  Tenderness or pain.  Fever. DIAGNOSIS  Your health care provider can usually determine what is wrong based on a physical exam. Blood tests may also be done. TREATMENT  Treatment usually involves taking an antibiotic medicine. HOME CARE INSTRUCTIONS   Take your antibiotic medicine as directed by your health care provider. Finish the antibiotic even if you start to feel better.  Keep the infected arm or leg elevated to reduce swelling.  Apply a warm cloth to the affected area up to 4 times per day to relieve pain.  Take medicines only as directed by your health care provider.  Keep all follow-up visits as directed by your health care provider. SEEK MEDICAL CARE IF:   You notice red streaks coming from the infected area.  Your red area gets larger or turns dark in color.  Your bone or joint underneath the infected area becomes painful after the skin has healed.  Your infection returns in the same area or another area.  You notice a swollen bump in the infected area.  You develop new symptoms.  You have a fever. SEEK IMMEDIATE MEDICAL CARE IF:   You feel very sleepy.  You develop vomiting or diarrhea.  You have a general ill feeling (malaise) with muscle aches and pains.   This  information is not intended to replace advice given to you by your health care provider. Make sure you discuss any questions you have with your health care provider.    Emergency Department Resource Guide 1) Find a Doctor and Pay Out of Pocket Although you won't have to find out who is covered by your insurance plan, it is a good idea to ask around and get recommendations. You will then need to call the office and see if the doctor you have chosen will accept you as a new patient and what types of options they offer for patients who are self-pay. Some doctors offer discounts or will set up payment plans for their patients who do not have insurance, but you will need to ask so you aren't surprised when you get to your appointment.  2) Contact Your Local Health Department Not all health departments have doctors that can see patients for sick visits, but many do, so it is worth a call to see if yours does. If you don't know where your local health department is, you can check in your phone book. The CDC also has a tool to help you locate your state's health department, and many state websites also have listings of all of their local health departments.  3) Find a [facility] If your illness is not likely to be very severe or complicated, you may want to try a walk in clinic. These are popping up all over the country in pharmacies, drugstores, and shopping centers.  They're usually staffed by nurse practitioners or physician assistants that have been trained to treat common illnesses and complaints. They're usually fairly quick and inexpensive. However, if you have serious medical issues or chronic medical problems, these are probably not your best option.  No Primary Care Doctor: - Call Health Connect at  [phone] - they can help you locate a primary care doctor that  accepts your insurance, provides certain services, etc. - Physician Referral Service- [phone]  Chronic Pain  Problems: Organization         Address  Phone   Notes  [facility]  [phone] Patients need to be referred by their primary care doctor.   Medication Assistance: Organization         Address  Phone   Notes  [facility] [address] [place], [place] [address] [phone] --Must be a resident of [facility] -- Must have NO insurance coverage whatsoever (no Medicaid/ Medicare, etc.) -- The pt. MUST have a primary care doctor that directs their care regularly and follows them in the community   [facility]  [phone]   [employer]  [phone]    Agencies that provide inexpensive medical care: Organization         Address  Phone   Notes  [place]  [phone]   [facility]    [phone]   [facility] [place], [place] [address] [phone]   [place] [address] [place] [phone]   Planned Parenthood    [phone]   [facility]    [phone]   [place] and [place] [address], [place] Phone:  [phone], Fax:  [phone] Hours of Operation:  9 am - 6 pm, M-F.  Also accepts Medicaid/Medicare and self-pay.  [facility] for [place], [address], [place] Phone: [phone], Fax: [phone]. Hours of Operation:  8:30 am - 5:30 pm, M-F.  Also accepts Medicaid and self-pay.  [facility] [place] [address] [place] Phone: [phone]   [place], [place], [place] [phone], Ext. [phone] Mondays & Thursdays: 7-9 AM.  First 15 patients are seen on a first come, first serve basis.    [place] Providers:  Organization         Address  Phone   Notes  [facility] [address], [place] [phone] Also  accepts self-pay patients.  [facility] [id number] [place], [place]  [phone]   [place], [address], [place] [phone]   [facility] [address] [place] [phone]   Lucianne Lei [address], [place]   [phone] Only accepts [place] Access [place] patients after they have their name applied to their card.   Self-Pay (no insurance) in [place]:  Organization         Address  Phone   Notes  Sickle Cell Patients, [facility] [place] [phone]   [facility] [place] [phone]   [facility] [place]  [place] [address], [place], [place] [phone]  [facility]/Dr. Osei-Bonsu  [address] [place] or [address], [place] [phone] Phone number for both [place] and [place] locations is the same.  Urgent Medical and [facility] [address] [place] [phone]   [facility] [address] [place] or [address] [phone]   [facility] [address], [place] [phone], phone; [phone], fax Sees patients 1st and 3rd Saturday of every month.  Must not qualify for public or private insurance (i.e. Medicaid, Medicare, [place] Health Choice, Veterans' Benefits)  Household income should be no more than 200% of the poverty level The clinic cannot treat you if you are pregnant or think you are pregnant  Sexually transmitted diseases are not treated at the clinic.    Dental Care: Organization         Address  Phone  Notes  [facility] Department [facility] [place] [phone] Accepts children up to age 63 who are enrolled in [place] or [place]; pregnant  women with a Medicaid card; and children who have applied for Medicaid or [place] Health Choice, but were declined, whose parents can pay a reduced fee at time of service.  [facility] Department of [facility]  [address], [place] [phone] Accepts children up to age 22 who are enrolled in [place] or [place]; pregnant women with a Medicaid card; and children who have applied for Medicaid or [place] Health Choice, but were declined, whose parents can pay a reduced fee at time of service.  [place] [facility]  [place] [phone] Patients are seen by appointment only. Walk-ins are not accepted. [place] will see patients 74 years of age and older. Monday - Tuesday (8am-5pm) Most Wednesdays (8:30-5pm) $30 per visit, cash only  [facility] Adult Dental Access PRO[facility]  [address], [facility] [phone] Patients are seen by appointment only. Walk-ins are not accepted. [place] will see patients 69 years of age and older. One Wednesday Evening (Monthly: Volunteer Based).  $30 per visit, cash only  [place]  [phone] for adults; Children under age 73, call Graduate Pediatric Dentistry at [phone]. Children aged 62-14, please call [phone] to request a pediatric application.  Dental services are provided in all areas of dental care including fillings, crowns and bridges, complete and partial dentures, implants, gum treatment, root canals, and extractions. Preventive care is also provided. Treatment is provided to both adults and children. Patients are selected via a lottery and there is often a waiting list.   [facility] [address] [place]  [phone] [email or web address]   [facility] [address] [place], [place] [phone], Ext. [phone] Second and Fourth Thursday of each month, opens at 6:30 AM; Clinic ends at 9 AM.  Patients are  seen on a first-come first-served basis, and a limited number are seen during each clinic.   [facility]  [address] [place], [place] [phone]   Eligibility Requirements You must have lived in [place], [place], or [place] counties for at least the last three months.   You cannot be eligible for state or federal sponsored [employer], including [employer], [place], or [employer].   You generally cannot be eligible for healthcare insurance  through your employer.    How to apply: Eligibility screenings are held every Tuesday and Wednesday afternoon from 1:00 pm until 4:00 pm. You do not need an appointment for the interview!  [facility] [address], [place], [place]   [place]  [place]  [place]  [phone]

## 2015-11-13 NOTE — ED Notes (Signed)
The pt has had a painful swollen rt little finger for one week.  She does not know what caused the finger to hurt.  She has a blister on the lateral side of the finger.  Her entire hand is swollen  And the pain is increasing    lmp jan 23rd  She reports a temp of 103 yesterday.

## 2015-12-18 ENCOUNTER — Encounter (HOSPITAL_COMMUNITY): Payer: Self-pay | Admitting: Emergency Medicine

## 2015-12-18 ENCOUNTER — Emergency Department (HOSPITAL_COMMUNITY)
Admission: EM | Admit: 2015-12-18 | Discharge: 2015-12-18 | Disposition: A | Discharge status: Home / Self Care | Payer: No Typology Code available for payment source | Attending: Emergency Medicine | Admitting: Emergency Medicine

## 2015-12-18 DIAGNOSIS — R040 Epistaxis: Secondary | ICD-10-CM

## 2015-12-18 DIAGNOSIS — Z3202 Encounter for pregnancy test, result negative: Secondary | ICD-10-CM | POA: Insufficient documentation

## 2015-12-18 DIAGNOSIS — R42 Dizziness and giddiness: Secondary | ICD-10-CM | POA: Insufficient documentation

## 2015-12-18 DIAGNOSIS — D649 Anemia, unspecified: Secondary | ICD-10-CM | POA: Insufficient documentation

## 2015-12-18 DIAGNOSIS — E119 Type 2 diabetes mellitus without complications: Secondary | ICD-10-CM | POA: Insufficient documentation

## 2015-12-18 DIAGNOSIS — Z792 Long term (current) use of antibiotics: Secondary | ICD-10-CM | POA: Insufficient documentation

## 2015-12-18 DIAGNOSIS — Z7984 Long term (current) use of oral hypoglycemic drugs: Secondary | ICD-10-CM | POA: Insufficient documentation

## 2015-12-18 LAB — BASIC METABOLIC PANEL
Anion gap: 10 (ref 5–15)
BUN: 13 mg/dL (ref 6–20)
CHLORIDE: 106 mmol/L (ref 101–111)
CO2: 23 mmol/L (ref 22–32)
Calcium: 9.3 mg/dL (ref 8.9–10.3)
Creatinine, Ser: 0.62 mg/dL (ref 0.44–1.00)
GFR calc non Af Amer: >60 mL/min (ref >60)
Glucose, Bld: 413 mg/dL — ABNORMAL HIGH (ref 65–99)
Potassium: 4.1 mmol/L (ref 3.5–5.1)
SODIUM: 139 mmol/L (ref 135–145)

## 2015-12-18 LAB — URINE MICROSCOPIC-ADD ON: RBC / HPF: NONE SEEN RBC/hpf (ref 0–5)

## 2015-12-18 LAB — CBC
HCT: 33.5 % — ABNORMAL LOW (ref 36.0–46.0)
HEMOGLOBIN: 11.3 g/dL — AB (ref 12.0–15.0)
MCH: 26.3 pg (ref 26.0–34.0)
MCHC: 33.7 g/dL (ref 30.0–36.0)
MCV: 78.1 fL (ref 78.0–100.0)
Platelets: 193 10*3/uL (ref 150–400)
RBC: 4.29 MIL/uL (ref 3.87–5.11)
RDW: 15.2 % (ref 11.5–15.5)
WBC: 5.9 10*3/uL (ref 4.0–10.5)

## 2015-12-18 LAB — URINALYSIS, ROUTINE W REFLEX MICROSCOPIC
Bilirubin Urine: NEGATIVE
HGB URINE DIPSTICK: NEGATIVE
KETONES UR: NEGATIVE mg/dL
LEUKOCYTES UA: NEGATIVE
NITRITE: NEGATIVE
PH: 5.5 (ref 5.0–8.0)
PROTEIN: NEGATIVE mg/dL
Specific Gravity, Urine: 1.028 (ref 1.005–1.030)

## 2015-12-18 LAB — I-STAT BETA HCG BLOOD, ED (MC, WL, AP ONLY): I-stat hCG, quantitative: <5 m[IU]/mL (ref <5)

## 2015-12-18 MED ORDER — OXYMETAZOLINE HCL 0.05 % NA SOLN
1.0000 | Freq: Once | NASAL | Status: AC
  Administered 2015-12-18: 1 via NASAL
  Filled 2015-12-18: qty 15

## 2015-12-18 NOTE — ED Notes (Signed)
Nosebleed for four hours today. Has been occuring intermittently for 2-3 weeks, but usually does not last long. Denies use of blood thinners. Nose not bleeding at this time, stopped at 1600. Feeling weak and dizzy.

## 2015-12-18 NOTE — ED Provider Notes (Signed)
CSN: [id number]     Arrival date & time 12/18/15  1646 History   First MD Initiated Contact with Patient 12/18/15 1749     Chief Complaint  Patient presents with  . Epistaxis  . Dizziness     (Consider location/radiation/quality/duration/timing/severity/associated sxs/prior Treatment) HPI Comments: Patient is a 30 year old female with history of type 2 diabetes. She presents for evaluation of intermittent nosebleeds for the past several weeks. He states that it was much worse today, however has since gotten it to stop with direct pressure. She does report a history of chronic nosebleeds. She been seen by a specialist at one point was given a nasal spray which seemed to help. She has not been using this for quite some time. She denies any new injury or trauma. She denies any fevers or chills.  Patient is a 30 y.o. female presenting with nosebleeds. The history is provided by the patient.  Epistaxis Location:  R nare Severity:  Moderate Duration:  3 weeks Timing:  Intermittent Progression:  Worsening Chronicity:  Recurrent Context: not anticoagulants, not aspirin use and not thrombocytopenia  Drug use: 3.   Relieved by:  Nothing Worsened by:  Nothing tried Ineffective treatments:  None tried Associated symptoms: dizziness     History reviewed. No pertinent past medical history. History reviewed. No pertinent past surgical history. Family History  Problem Relation Age of Onset  . Diabetes Father   . Asthma Father   . COPD Father    Social History  Substance Use Topics  . Smoking status: Never Smoker   . Smokeless tobacco: None  . Alcohol Use: No   OB History    No data available     Review of Systems  HENT: Positive for nosebleeds.   Neurological: Positive for dizziness.  All other systems reviewed and are negative.     Allergies  Shellfish allergy  Home Medications   Prior to Admission medications   Medication Sig Start Date End Date Taking? Authorizing  Provider  clindamycin (CLEOCIN) 150 MG capsule Take 2 capsules (300 mg total) by mouth 3 (three) times daily. 11/13/15   Sharlett Iles, MD  clotrimazole (LOTRIMIN) 1 % cream Apply to affected area 2 times daily Patient not taking: Reported on 11/13/2015 07/21/15   Tatyana Kirichenko, PA-C  Ibuprofen (MIDOL PO) Take 1 tablet by mouth daily as needed (menstrual cramps).    Historical Provider, MD  metFORMIN (GLUCOPHAGE) 500 MG tablet Take 1 tablet (500 mg total) by mouth 2 (two) times daily with a meal. 11/13/15   Sharlett Iles, MD  naproxen sodium (ALEVE) 220 MG tablet Take 220 mg by mouth daily as needed (menstrual cramps).    Historical Provider, MD   BP 130/99 mmHg  Pulse 89  Temp(Src) 98.2 F (36.8 C) (Oral)  Resp 16  Ht 4\' 11"  (1.499 m)  Wt 198 lb (89.812 kg)  BMI 39.97 kg/m2  SpO2 100%  LMP 12/04/2015 Physical Exam  Constitutional: She is oriented to person, place, and time. She appears well-developed and well-nourished. No distress.  HENT:  Head: Normocephalic and atraumatic.  Mouth/Throat: Oropharynx is clear and moist.  The left nares is clear. The right has old blood, but no active bleeding. There is no obvious source for the bleeding noted.  Neck: Normal range of motion. Neck supple.  Musculoskeletal: Normal range of motion.  Neurological: She is alert and oriented to person, place, and time.  Skin: Skin is warm and dry. She is not diaphoretic.  Nursing note  and vitals reviewed.   ED Course  Procedures (including critical care time) Labs Review Labs Reviewed  CBC - Abnormal; Notable for the following:    Hemoglobin 11.3 (*)    HCT 33.5 (*)    All other components within normal limits  BASIC METABOLIC PANEL  URINALYSIS, ROUTINE W REFLEX MICROSCOPIC (NOT AT [facility])  I-STAT BETA HCG BLOOD, ED ([facility], [facility], [facility] ONLY)    Imaging Review No results found. I have personally reviewed and evaluated these images and lab results as part of my medical decision-making.    EKG Interpretation None      MDM   Final diagnoses:  None    Patient presents with intermittent nosebleeds worsening over the past several weeks. It is now stopped. Her laboratory studies are unremarkable. She is slightly anemic, however to a lesser degree than her previous laboratory studies. I see no indication for packing or other intervention. My recommendations will be for her to use Neo-Synephrine nasal spray and return as needed if symptoms worsen.    Veryl Speak, MD 12/18/15 [phone]

## 2015-12-18 NOTE — Discharge Instructions (Signed)
Neo-Synephrine nasal spray: 1 sprain each nostril 3 times daily.  If your bleeding resumes, pinch your nostrils and to your head forward shut for 15 minutes. If bleeding persists, repeat. If this does not improve, then return to the emergency department.  Follow-up with your primary Dr. if not improving.   Nosebleed Nosebleeds are common. They are due to a crack in the inside lining of your nose (mucous membrane) or from a small blood vessel that starts to bleed. Nosebleeds can be caused by many conditions, such as injury, infections, dry mucous membranes or dry climate, medicines, nose picking, and home heating and cooling systems. Most nosebleeds come from blood vessels in the front of your nose. HOME CARE INSTRUCTIONS   Try controlling your nosebleed by pinching your nostrils gently and continuously for at least 10 minutes.  Avoid blowing or sniffing your nose for a number of hours after having a nosebleed.  Do not put gauze inside your nose yourself. If your nose was packed by your health care provider, try to maintain the pack inside of your nose until your health care provider removes it.  If a gauze pack was used and it starts to fall out, gently replace it or cut off the end of it.  If a balloon catheter was used to pack your nose, do not cut or remove it unless your health care provider has instructed you to do that.  Avoid lying down while you are having a nosebleed. Sit up and lean forward.  Use a nasal spray decongestant to help with a nosebleed as directed by your health care provider.  Do not use petroleum jelly or mineral oil in your nose. These can drip into your lungs.  Maintain humidity in your home by using less air conditioning or by using a humidifier.  Aspirinand blood thinners make bleeding more likely. If you are prescribed these medicines and you suffer from nosebleeds, ask your health care provider if you should stop taking the medicines or adjust the dose. Do  not stop medicines unless directed by your health care provider  Resume your normal activities as you are able, but avoid straining, lifting, or bending at the waist for several days.  If your nosebleed was caused by dry mucous membranes, use over-the-counter saline nasal spray or gel. This will keep the mucous membranes moist and allow them to heal. If you must use a lubricant, choose the water-soluble variety. Use it only sparingly, and do not use it within several hours of lying down.  Keep all follow-up visits as directed by your health care provider. This is important. SEEK MEDICAL CARE IF:  You have a fever.  You get frequent nosebleeds.  You are getting nosebleeds more often. SEEK IMMEDIATE MEDICAL CARE IF:  Your nosebleed lasts longer than 20 minutes.  Your nosebleed occurs after an injury to your face, and your nose looks crooked or broken.  You have unusual bleeding from other parts of your body.  You have unusual bruising on other parts of your body.  You feel light-headed or you faint.  You become sweaty.  You vomit blood.  Your nosebleed occurs after a head injury.   This information is not intended to replace advice given to you by your health care provider. Make sure you discuss any questions you have with your health care provider.   Document Released: 07/12/2005 Document Revised: 10/23/2014 Document Reviewed: 05/18/2014 Elsevier Interactive Patient Education [employer].

## 2016-10-16 DIAGNOSIS — I7771 Dissection of carotid artery: Secondary | ICD-10-CM

## 2016-10-16 HISTORY — DX: Dissection of carotid artery: I77.71

## 2017-03-02 DIAGNOSIS — I7771 Dissection of carotid artery: Secondary | ICD-10-CM | POA: Diagnosis present

## 2017-03-02 DIAGNOSIS — R29810 Facial weakness: Secondary | ICD-10-CM | POA: Diagnosis present

## 2017-03-02 DIAGNOSIS — Z7984 Long term (current) use of oral hypoglycemic drugs: Secondary | ICD-10-CM

## 2017-03-02 DIAGNOSIS — I6521 Occlusion and stenosis of right carotid artery: Secondary | ICD-10-CM | POA: Diagnosis present

## 2017-03-02 DIAGNOSIS — E1165 Type 2 diabetes mellitus with hyperglycemia: Secondary | ICD-10-CM | POA: Diagnosis present

## 2017-03-02 DIAGNOSIS — Z6836 Body mass index (BMI) 36.0-36.9, adult: Secondary | ICD-10-CM

## 2017-03-02 DIAGNOSIS — L309 Dermatitis, unspecified: Secondary | ICD-10-CM | POA: Diagnosis present

## 2017-03-02 DIAGNOSIS — E669 Obesity, unspecified: Secondary | ICD-10-CM | POA: Diagnosis present

## 2017-03-02 DIAGNOSIS — D649 Anemia, unspecified: Secondary | ICD-10-CM | POA: Diagnosis present

## 2017-03-02 DIAGNOSIS — R402412 Glasgow coma scale score 13-15, at arrival to emergency department: Secondary | ICD-10-CM | POA: Diagnosis present

## 2017-03-02 DIAGNOSIS — I63511 Cerebral infarction due to unspecified occlusion or stenosis of right middle cerebral artery: Principal | ICD-10-CM | POA: Diagnosis present

## 2017-03-02 DIAGNOSIS — I10 Essential (primary) hypertension: Secondary | ICD-10-CM | POA: Diagnosis present

## 2017-03-03 ENCOUNTER — Emergency Department (HOSPITAL_COMMUNITY): Payer: Self-pay

## 2017-03-03 ENCOUNTER — Inpatient Hospital Stay (HOSPITAL_COMMUNITY)
Admission: EM | Admit: 2017-03-03 | Discharge: 2017-03-05 | DRG: 064 | Disposition: A | Discharge status: Home / Self Care | Payer: Self-pay | Attending: Internal Medicine | Admitting: Internal Medicine

## 2017-03-03 ENCOUNTER — Inpatient Hospital Stay (HOSPITAL_COMMUNITY): Payer: Self-pay

## 2017-03-03 ENCOUNTER — Encounter (HOSPITAL_COMMUNITY): Payer: Self-pay | Admitting: Emergency Medicine

## 2017-03-03 DIAGNOSIS — Z825 Family history of asthma and other chronic lower respiratory diseases: Secondary | ICD-10-CM

## 2017-03-03 DIAGNOSIS — Z91013 Allergy to seafood: Secondary | ICD-10-CM

## 2017-03-03 DIAGNOSIS — Z8249 Family history of ischemic heart disease and other diseases of the circulatory system: Secondary | ICD-10-CM

## 2017-03-03 DIAGNOSIS — E119 Type 2 diabetes mellitus without complications: Secondary | ICD-10-CM

## 2017-03-03 DIAGNOSIS — Z8673 Personal history of transient ischemic attack (TIA), and cerebral infarction without residual deficits: Secondary | ICD-10-CM | POA: Diagnosis present

## 2017-03-03 DIAGNOSIS — I63031 Cerebral infarction due to thrombosis of right carotid artery: Secondary | ICD-10-CM

## 2017-03-03 DIAGNOSIS — R200 Anesthesia of skin: Secondary | ICD-10-CM

## 2017-03-03 DIAGNOSIS — I6521 Occlusion and stenosis of right carotid artery: Secondary | ICD-10-CM

## 2017-03-03 DIAGNOSIS — J811 Chronic pulmonary edema: Secondary | ICD-10-CM

## 2017-03-03 DIAGNOSIS — I638 Other cerebral infarction: Secondary | ICD-10-CM

## 2017-03-03 DIAGNOSIS — G514 Facial myokymia: Secondary | ICD-10-CM

## 2017-03-03 DIAGNOSIS — I639 Cerebral infarction, unspecified: Secondary | ICD-10-CM

## 2017-03-03 DIAGNOSIS — L309 Dermatitis, unspecified: Secondary | ICD-10-CM

## 2017-03-03 LAB — URINALYSIS, ROUTINE W REFLEX MICROSCOPIC
Bacteria, UA: NONE SEEN
Bilirubin Urine: NEGATIVE
Glucose, UA: >=500 mg/dL — AB
Hgb urine dipstick: NEGATIVE
Ketones, ur: NEGATIVE mg/dL
Nitrite: NEGATIVE
Protein, ur: NEGATIVE mg/dL
SPECIFIC GRAVITY, URINE: 1.031 — AB (ref 1.005–1.030)
pH: 5 (ref 5.0–8.0)

## 2017-03-03 LAB — BASIC METABOLIC PANEL WITH GFR
Anion gap: 10 (ref 5–15)
BUN: 9 mg/dL (ref 6–20)
CO2: 20 mmol/L — ABNORMAL LOW (ref 22–32)
Calcium: 9.2 mg/dL (ref 8.9–10.3)
Chloride: 104 mmol/L (ref 101–111)
Creatinine, Ser: 0.56 mg/dL (ref 0.44–1.00)
GFR calc Af Amer: >60 mL/min
GFR calc non Af Amer: >60 mL/min
Glucose, Bld: 464 mg/dL — ABNORMAL HIGH (ref 65–99)
Potassium: 3.6 mmol/L (ref 3.5–5.1)
Sodium: 134 mmol/L — ABNORMAL LOW (ref 135–145)

## 2017-03-03 LAB — CBC
HCT: 32.9 % — ABNORMAL LOW (ref 36.0–46.0)
Hemoglobin: 11 g/dL — ABNORMAL LOW (ref 12.0–15.0)
MCH: 26.4 pg (ref 26.0–34.0)
MCHC: 33.4 g/dL (ref 30.0–36.0)
MCV: 79.1 fL (ref 78.0–100.0)
Platelets: 177 K/uL (ref 150–400)
RBC: 4.16 MIL/uL (ref 3.87–5.11)
RDW: 15.6 % — ABNORMAL HIGH (ref 11.5–15.5)
WBC: 7.2 K/uL (ref 4.0–10.5)

## 2017-03-03 LAB — APTT: aPTT: 25 seconds (ref 24–36)

## 2017-03-03 LAB — RAPID URINE DRUG SCREEN, HOSP PERFORMED
AMPHETAMINES: NOT DETECTED
BARBITURATES: NOT DETECTED
Benzodiazepines: NOT DETECTED
Cocaine: NOT DETECTED
Opiates: NOT DETECTED
TETRAHYDROCANNABINOL: NOT DETECTED

## 2017-03-03 LAB — DIFFERENTIAL
BLASTS: 0 %
Band Neutrophils: 4 %
Basophils Absolute: 0 10*3/uL (ref 0.0–0.1)
Basophils Relative: 3 %
EOS PCT: 3 %
Eosinophils Absolute: 0.2 10*3/uL (ref 0.0–0.7)
LYMPHS PCT: 18 %
Lymphs Abs: 1.2 10*3/uL (ref 0.7–4.0)
MONOS PCT: 4 %
MYELOCYTES: 0 %
Metamyelocytes Relative: 1 %
Monocytes Absolute: 0.3 10*3/uL (ref 0.1–1.0)
NEUTROS ABS: 3.3 10*3/uL (ref 1.7–7.7)
NEUTROS PCT: 67 %
NRBC: 0 /100{WBCs}
Other: 0 %
PROMYELOCYTES ABS: 0 %

## 2017-03-03 LAB — I-STAT BETA HCG BLOOD, ED (MC, WL, AP ONLY): I-stat hCG, quantitative: <5 m[IU]/mL (ref <5)

## 2017-03-03 LAB — I-STAT TROPONIN, ED: TROPONIN I, POC: 0 ng/mL (ref 0.00–0.08)

## 2017-03-03 LAB — CBG MONITORING, ED: Glucose-Capillary: 353 mg/dL — ABNORMAL HIGH (ref 65–99)

## 2017-03-03 LAB — ETHANOL: ALCOHOL ETHYL (B): 11 mg/dL — AB (ref <5)

## 2017-03-03 LAB — PROTIME-INR
INR: 1.04
PROTHROMBIN TIME: 13.6 s (ref 11.4–15.2)

## 2017-03-03 LAB — ANTITHROMBIN III: ANTITHROMB III FUNC: 109 % (ref 75–120)

## 2017-03-03 LAB — GLUCOSE, CAPILLARY
GLUCOSE-CAPILLARY: 279 mg/dL — AB (ref 65–99)
GLUCOSE-CAPILLARY: 268 mg/dL — AB (ref 65–99)
Glucose-Capillary: 252 mg/dL — ABNORMAL HIGH (ref 65–99)

## 2017-03-03 MED ORDER — ASPIRIN EC 325 MG PO TBEC
325.0000 mg | DELAYED_RELEASE_TABLET | Freq: Every day | ORAL | Status: DC
  Administered 2017-03-04 – 2017-03-05 (×2): 325 mg via ORAL
  Filled 2017-03-03 (×2): qty 1

## 2017-03-03 MED ORDER — DIPHENHYDRAMINE HCL 50 MG/ML IJ SOLN
25.0000 mg | Freq: Once | INTRAMUSCULAR | Status: AC
  Administered 2017-03-03: 25 mg via INTRAVENOUS
  Filled 2017-03-03: qty 1

## 2017-03-03 MED ORDER — ATORVASTATIN CALCIUM 80 MG PO TABS
80.0000 mg | ORAL_TABLET | Freq: Every day | ORAL | Status: DC
  Administered 2017-03-03 – 2017-03-05 (×3): 80 mg via ORAL
  Filled 2017-03-03 (×3): qty 1

## 2017-03-03 MED ORDER — SODIUM CHLORIDE 0.9 % IV BOLUS (SEPSIS)
1000.0000 mL | Freq: Once | INTRAVENOUS | Status: AC
  Administered 2017-03-03: 1000 mL via INTRAVENOUS

## 2017-03-03 MED ORDER — ACETAMINOPHEN 325 MG PO TABS
650.0000 mg | ORAL_TABLET | ORAL | Status: DC | PRN
  Administered 2017-03-03 – 2017-03-05 (×3): 650 mg via ORAL
  Filled 2017-03-03 (×4): qty 2

## 2017-03-03 MED ORDER — ACETAMINOPHEN 650 MG RE SUPP: 650.0000 mg | RECTAL | Status: DC | PRN

## 2017-03-03 MED ORDER — ACETAMINOPHEN 160 MG/5ML PO SOLN: 650.0000 mg | ORAL | Status: DC | PRN

## 2017-03-03 MED ORDER — SENNOSIDES-DOCUSATE SODIUM 8.6-50 MG PO TABS: 1.0000 | ORAL_TABLET | Freq: Every evening | ORAL | Status: DC | PRN

## 2017-03-03 MED ORDER — SODIUM CHLORIDE 0.9 % IV SOLN
INTRAVENOUS | Status: DC
  Administered 2017-03-03: 11:00:00 via INTRAVENOUS

## 2017-03-03 MED ORDER — IOPAMIDOL (ISOVUE-370) INJECTION 76%
INTRAVENOUS | Status: AC
  Administered 2017-03-03: 50 mL
  Filled 2017-03-03: qty 50

## 2017-03-03 MED ORDER — INSULIN ASPART 100 UNIT/ML ~~LOC~~ SOLN
0.0000 [IU] | Freq: Three times a day (TID) | SUBCUTANEOUS | Status: DC
  Administered 2017-03-03 (×2): 5 [IU] via SUBCUTANEOUS

## 2017-03-03 MED ORDER — KETOROLAC TROMETHAMINE 30 MG/ML IJ SOLN
30.0000 mg | Freq: Once | INTRAMUSCULAR | Status: AC
  Administered 2017-03-03: 30 mg via INTRAVENOUS
  Filled 2017-03-03: qty 1

## 2017-03-03 MED ORDER — METOCLOPRAMIDE HCL 5 MG/ML IJ SOLN
10.0000 mg | Freq: Once | INTRAMUSCULAR | Status: AC
  Administered 2017-03-03: 10 mg via INTRAVENOUS
  Filled 2017-03-03: qty 2

## 2017-03-03 MED ORDER — GADOBENATE DIMEGLUMINE 529 MG/ML IV SOLN
18.0000 mL | Freq: Once | INTRAVENOUS | Status: AC | PRN
  Administered 2017-03-03: 18 mL via INTRAVENOUS

## 2017-03-03 MED ORDER — ASPIRIN 81 MG PO CHEW
324.0000 mg | CHEWABLE_TABLET | Freq: Once | ORAL | Status: AC
  Administered 2017-03-03: 324 mg via ORAL
  Filled 2017-03-03: qty 4

## 2017-03-03 MED ORDER — ASPIRIN 81 MG PO CHEW: 81.0000 mg | CHEWABLE_TABLET | Freq: Every day | ORAL | Status: DC

## 2017-03-03 MED ORDER — INSULIN ASPART 100 UNIT/ML ~~LOC~~ SOLN
6.0000 [IU] | Freq: Once | SUBCUTANEOUS | Status: AC
  Administered 2017-03-03: 6 [IU] via INTRAVENOUS
  Filled 2017-03-03: qty 1

## 2017-03-03 MED ORDER — STROKE: EARLY STAGES OF RECOVERY BOOK
Freq: Once | Status: AC
  Administered 2017-03-03: 10:00:00
  Filled 2017-03-03: qty 1

## 2017-03-03 MED ORDER — ENOXAPARIN SODIUM 40 MG/0.4ML ~~LOC~~ SOLN
40.0000 mg | Freq: Every day | SUBCUTANEOUS | Status: DC
  Administered 2017-03-03 – 2017-03-04 (×2): 40 mg via SUBCUTANEOUS
  Filled 2017-03-03 (×2): qty 0.4

## 2017-03-03 NOTE — ED Provider Notes (Addendum)
Patient signed out to me by Dr. Regenia Skeeter. I examine her and interviewed her immediately upon return from MRI. Complains of diffuse headache and intermittent facial twitching for the past 2 weeks. She is asymptomatic since treatment here and as I examine her. On exam alert Glasgow Coma Score 15 appears in no distress HEENT exam no facial asymmetry neurologic cranial nerves II through XII grossly intact. Moves all extremities well or strength 5 over 5 overall ED ECG REPORT   Date: 03/03/2017  Rate: 80  Rhythm: normal sinus rhythm  QRS Axis: normal  Intervals: normal  ST/T Wave abnormalities: nonspecific T wave changes  Conduction Disutrbances:none  Narrative Interpretation:   Old EKG Reviewed: unchanged No significant change since yesterday I have personally reviewed the EKG tracing and agree with the computerized printout as noted. Results for orders placed or performed during the hospital encounter of 03/03/17  CBC  Result Value Ref Range   WBC 7.2 4.0 - 10.5 K/uL   RBC 4.16 3.87 - 5.11 MIL/uL   Hemoglobin 11.0 (L) 12.0 - 15.0 g/dL   HCT 32.9 (L) 36.0 - 46.0 %   MCV 79.1 78.0 - 100.0 fL   MCH 26.4 26.0 - 34.0 pg   MCHC 33.4 30.0 - 36.0 g/dL   RDW 15.6 (H) 11.5 - 15.5 %   Platelets 177 150 - 400 K/uL  Basic metabolic panel  Result Value Ref Range   Sodium 134 (L) 135 - 145 mmol/L   Potassium 3.6 3.5 - 5.1 mmol/L   Chloride 104 101 - 111 mmol/L   CO2 20 (L) 22 - 32 mmol/L   Glucose, Bld 464 (H) 65 - 99 mg/dL   BUN 9 6 - 20 mg/dL   Creatinine, Ser 0.56 0.44 - 1.00 mg/dL   Calcium 9.2 8.9 - 10.3 mg/dL   GFR calc non Af Amer >60 >60 mL/min   GFR calc Af Amer >60 >60 mL/min   Anion gap 10 5 - 15  Rapid urine drug screen (hospital performed)  Result Value Ref Range   Opiates NONE DETECTED NONE DETECTED   Cocaine NONE DETECTED NONE DETECTED   Benzodiazepines NONE DETECTED NONE DETECTED   Amphetamines NONE DETECTED NONE DETECTED   Tetrahydrocannabinol NONE DETECTED NONE DETECTED    Barbiturates NONE DETECTED NONE DETECTED  I-Stat Beta hCG blood, ED ([facility], [facility], [facility] only)  Result Value Ref Range   I-stat hCG, quantitative <5.0 <5 mIU/mL   Comment 3          CBG monitoring, ED  Result Value Ref Range   Glucose-Capillary 353 (H) 65 - 99 mg/dL   Ct Head Wo Contrast  Result Date: 03/03/2017 CLINICAL DATA:  Seizure like symptoms 2 days ago. Left-sided facial twitching and numbness. EXAM: CT HEAD WITHOUT CONTRAST TECHNIQUE: Contiguous axial images were obtained from the base of the skull through the vertex without intravenous contrast. COMPARISON:  09/23/2013 FINDINGS: Brain: No evidence of acute infarction, hemorrhage, hydrocephalus, extra-axial collection or mass lesion/mass effect. Vascular: No hyperdense vessel or unexpected calcification. Skull: Normal. Negative for fracture or focal lesion. Sinuses/Orbits: No acute finding. Other: Small cutaneous right scalp nodule involving the anterior right parietal scalp. IMPRESSION: No acute intracranial abnormality. Tiny cutaneous parietal scalp nodule on the right. Electronically Signed   By: Ashley Royalty M.D.   On: 03/03/2017 03:58   Mr Brain Wo Contrast  Result Date: 03/03/2017 CLINICAL DATA:  New onset of left facial twitching and numbness over the last 2 days. Intermittent episodes lasting up to  15 seconds. Blurred vision on the left eye. Abnormal speech. EXAM: MRI HEAD WITHOUT CONTRAST TECHNIQUE: Multiplanar, multiecho pulse sequences of the brain and surrounding structures were obtained without intravenous contrast. COMPARISON:  CT head without contrast from the same day. FINDINGS: Brain: The diffusion-weighted images demonstrate AA focally restricted diffusion in the right frontal operculum. A focal area of cortical signal abnormality is present in the anterior right frontal lobe with decreased subcortical T2 signal abnormality. Asymmetric patchy T2 hyperintensities are noted subjacent to this area. More mild subcortical T2  hyperintensities are present on the right. No focal abnormalities are present on the left. The brainstem and cerebellum are normal. The internal auditory canals are within normal limits. Vascular: Abnormal signal is present within the right internal carotid artery. There is flow within the right MCA vessels. The left internal carotid artery is patent. The vertebrobasilar system is patent. Skull and upper cervical spine: The skullbase is within normal limits. The craniocervical junction is normal. Marrow signal is somewhat depressed. Sinuses/Orbits: The paranasal sinuses and mastoid air cells are clear. The globes and orbits are within normal limits. IMPRESSION: 1. Acute/subacute ischemia within an area of more chronic ischemic disease involving the right frontal operculum. There is some involvement of the precentral gyrus more laterally. 2. Asymmetric prominent patchy white matter disease adjacent to this area on the right, but also present on the left. 3. Occlusion of the right internal carotid artery. 4. The combination suggests an underlying vasculitis or coagulopathy. These results were called by telephone at the time of interpretation on 03/03/2017 at 8:37 am to Dr. Winfred Leeds , who verbally acknowledged these results. Electronically Signed   By: San Morelle M.D.   On: 03/03/2017 08:39   Internal medicine resident physician consulted to evaluate patient and arrange for admission. He saw patient in the emergency department. I've also consulted neurology Dr.Oster to evaluate patient. He will see patient while in hospital Subcutaneous insulin ordered by me Patient passed swallowing screen. 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

## 2017-03-03 NOTE — H&P (Signed)
Date: 03/03/2017               Patient Name:  Joann Meyers MRN: [id number]  DOB: 05-20-1986 Age / Sex: 31 y.o., female   PCP: Patient, No Pcp Per         Medical Service: Internal Medicine Teaching Service         Attending Physician: Dr. Annia Belt, MD    First Contact: Dr. Einar Gip Pager: [phone]  Second Contact: Dr. Ignacia Marvel Pager: [phone]       After Hours (After 5p/  First Contact Pager: [phone]  weekends / holidays): Second Contact Pager: [phone]   Chief Complaint: twitching, vision loss.   History of Present Illness: 31 y/o F from [place] without known medical history here with 2 week history of "burning" headache and 2-3 day history of left-sided facial twitching and weakness as well as intermittent left-sided vision loss. She reports the left side of her face intermittently starts twitching, pulling to the side and tingles however resolves spontaneously in about 15 seconds. During these episodes she notes left-sided blurred vision and also has difficulty getting words out. She has noted about 6 episodes since 5pm the night PTA. She also endorses a 2 week history of diffuse headache described as burning however otherwise has been feeling well. Denies any prior history of the same, blood clots or bleeding disorders. 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 The patient denies any fevers, chills, fatigue, chest pain, shortness of breath, leg swelling, dysuria or abdominal pain. She has no children and has never been pregnant.   In the ED VSS (temp 99*, BP 109/64, pulse 78, and she was saturating 100% on RA). BMET with glucose of 464 and CBC with Hb 11.0. UDS and Ethanol level negative. CT head was without acute abnormality. MRI showed acute/subacute ischemia within an area of more chronic-appearing ischemic disease of the right frontal  operculum. Asymmetric patchy white matter disease R>L and occlusion of right internal carotid artery. She was subsequently admitted to IMTS for further evaluation.  Meds:  No outpatient prescriptions have been marked as taking for the 03/03/17 encounter [facility] Encounter).   Allergies: Allergies as of 03/02/2017 - Review Complete 12/18/2015  Allergen Reaction Noted  . Shellfish allergy Anaphylaxis 12/18/2015   History reviewed. No pertinent past medical history.  Family History: Mother: Aloha Gell, age 58. Healthy other than DVTs for 20 years for which she takes coumadin. Father deceased at 64 due to complications of asthma. She has several brothers and sisters who are healthy. She is from [place] and has [place] in [place]. She is a Scientist, water quality at [employer].   Social History: She has never smoked, does not drink and does not use recreational drugs. She is engaged. Lives at home with her mother.   Review of Systems: A complete ROS was negative except as per HPI.   Physical Exam: Blood pressure 110/78, pulse 77, temperature 99 F (37.2 C), temperature source Oral, resp. rate 18, height 5\' 2"  (1.575 m), weight 198 lb (89.8 kg), last menstrual period 02/13/2017, SpO2 98 %. General: Overweight caucasian woman resting comfortably in bed. No acute distress. Speech fluent and without dysarthria.  HENT: PERRL. EOMI. No conjunctival injection, icterus or ptosis. Tongue with white adherent plaques. No tongue deviation. No fasciculations.   Cardiovascular: Regular rate and rhythm. No murmur  or rub appreciated. Pulmonary: CTA BL, no wheezing, crackles or rhonchi appreciated. Unlabored breathing.  Abdomen: Soft, non-tender. No guarding or rigidity. +bowel sounds.  Extremities: No peripheral edema noted BL. Intact distal pulses (DP, PT, radial, brachial). Skin: Warm, dry. 2 areas of dermatitis on dorsal aspects of left foot and hand.  Neuro: Motor strength 5/5 throughout without sensory  loss. No dysmetria. Normal rapid alternating movements.  Psych: Mood normal and affect was mood congruent. Responds to questions appropriately.   EKG: NSR with rate of 96. No ST segment changes.   Ct Head Wo Contrast: Result Date: 03/03/2017 IMPRESSION: No acute intracranial abnormality. Tiny cutaneous parietal scalp nodule on the right  Mr Brain Wo Contrast: Result Date: 03/03/2017  IMPRESSION: 1. Acute/subacute ischemia within an area of more chronic ischemic disease involving the right frontal operculum. There is some involvement of the precentral gyrus more laterally. 2. Asymmetric prominent patchy white matter disease adjacent to this area on the right, but also present on the left. 3. Occlusion of the right internal carotid artery. 4. The combination suggests an underlying vasculitis or coagulopathy.   Assessment & Plan by Problem: This is an otherwise healthy 31 y/o F who presents with a acute/subacute ischemic stroke involving the right frontal lobe/Right MCA territory with occlusion of the R ICA. Symptoms have resolved presently. Neurology on board.  Active Problems:   Stroke [facility])   Type 2 diabetes mellitus ([place])  Cryptogenic Stroke: In this young overweight woman with undiagnosed type 2 diabetes and mother with 25 year history of BL DVTs. She will require complete neurologic evaluation including a hypercoagulable work-up, ECHO, CTA neck, HbA1c, lipid panel, etc.  Neurology on board and we appreciate their recommendations. -ASA for secondary prevention -Permissive HTN to <220/110 -Telemetry -CTA head/neck -TTE, consider TEE -Lipid panel -HbA1c -She will require aggressive RF control -Hypercoagulability workup with: Antiphospholipid, factor 5 leiden, antithrombin III, ANA, Protein C&S activity, lupus anticoagulant panel, rheumatoid factor and homocysteine. -Remaining w/u per results of above  Type 2 Diabetes: Previously undiagnosed per patient however prior CBG in system >400 as  well. Sliding scale insulin while here + carb mod diet.  -FU HbA1c -Sliding scale as above  Diet: Carb mod IVF: Received 3L IVF in ED DVT PPx: Lovenox Code status: FULL Consults: Neurology  Dispo: Admit patient to Inpatient with expected length of stay greater than 2 midnights.  SignedEinar Gip, DO 03/03/2017, 9:24 AM  Pager: [phone]

## 2017-03-03 NOTE — Evaluation (Signed)
Occupational Therapy Evaluation and Discharge Patient Details Name: Joann Meyers MRN: [id number] DOB: 01/13/1986 Today's Date: 03/03/2017    History of Present Illness Pt is a 31 y/o female admitted secondary to facial twitching and numbness. MRI revealed acute/subacute ischemia and occlusion of the R ICA. No pertinent PMH.   Clinical Impression   PTA Pt independent in ADL and mobility. Pt works full time at [employer] and drives. Pt currently back at baseline. Pt did have decreased smoothness with a beat when crossing midline, but functionally the Pt was able to read and function within the room without problems. Pt was able to write a sentence and read from menu. Please see additional performance details below. Pt with no questions or concerns at the end of session, OT to sign off at this time. Should symptoms return, please feel free to re-order OT at that time.     Follow Up Recommendations  No OT follow up;Supervision - Intermittent    Equipment Recommendations  None recommended by OT    Recommendations for Other Services       Precautions / Restrictions Precautions Precautions: None Restrictions Weight Bearing Restrictions: No      Mobility Bed Mobility Overal bed mobility: Independent                Transfers Overall transfer level: Modified independent Equipment used: None                  Balance Overall balance assessment: Needs assistance Sitting-balance support: Feet supported Sitting balance-Leahy Scale: Normal     Standing balance support: During functional activity;No upper extremity supported Standing balance-Leahy Scale: Good                             ADL either performed or assessed with clinical judgement   ADL Overall ADL's : Independent                                       General ADL Comments: Able to don/doff socks, perform ambulation to bathroom and peri care, sink level grooming     Vision  Baseline Vision/History: No visual deficits Patient Visual Report: No change from baseline Vision Assessment?: Yes Eye Alignment: Within Functional Limits Ocular Range of Motion: Within Functional Limits Alignment/Gaze Preference: Within Defined Limits Tracking/Visual Pursuits: Decreased smoothness of horizontal tracking (beat at midline) Saccades: Within functional limits Convergence: Within functional limits Visual Fields: No apparent deficits Additional Comments: Pt able to read from menu and find items around the room, Pt able to find grooming items in big tub     Perception     Praxis      Pertinent Vitals/Pain Pain Assessment: No/denies pain     Hand Dominance Right   Extremity/Trunk Assessment Upper Extremity Assessment Upper Extremity Assessment: Overall WFL for tasks assessed   Lower Extremity Assessment Lower Extremity Assessment: Overall WFL for tasks assessed   Cervical / Trunk Assessment Cervical / Trunk Assessment: Normal   Communication Communication Communication: No difficulties   Cognition Arousal/Alertness: Awake/alert Behavior During Therapy: WFL for tasks assessed/performed Overall Cognitive Status: Within Functional Limits for tasks assessed                                     General Comments  Exercises     Shoulder Instructions      Home Living Family/patient expects to be discharged to:: Private residence Living Arrangements: Children;Parent Available Help at Discharge: Family;Available 24 hours/day Type of Home: House Home Access: Stairs to enter [employer] of Steps: 4 Entrance Stairs-Rails: Left Home Layout: One level     Bathroom Shower/Tub: Occupational psychologist: Standard     Home Equipment: None          Prior Functioning/Environment Level of Independence: Independent        Comments: drives, 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 Problem List:        OT  Treatment/Interventions:      OT Goals(Current goals can be found in the care plan section) Acute Rehab OT Goals Patient Stated Goal: return home OT Goal Formulation: With patient Time For Goal Achievement: 03/15/17 Potential to Achieve Goals: Good  OT Frequency:     Barriers to D/C:            Co-evaluation              AM-PAC PT "6 Clicks" Daily Activity     Outcome Measure Help from another person eating meals?: None Help from another person taking care of personal grooming?: None Help from another person toileting, which includes using toliet, bedpan, or urinal?: None Help from another person bathing (including washing, rinsing, drying)?: None Help from another person to put on and taking off regular upper body clothing?: None Help from another person to put on and taking off regular lower body clothing?: None 6 Click Score: 24   End of Session Equipment Utilized During Treatment: Gait belt Nurse Communication: Mobility status  Activity Tolerance: Patient tolerated treatment well Patient left: in bed;with call bell/phone within reach  OT Visit Diagnosis: Other symptoms and signs involving the nervous system (R29.898)                Time: 1497-0263 OT Time Calculation (min): 22 min Charges:  OT General Charges $OT Visit: 1 Procedure OT Evaluation $OT Eval Low Complexity: 1 Procedure G-Codes: OT G-codes **NOT FOR INPATIENT CLASS** Functional Assessment Tool Used: AM-PAC 6 Clicks Daily Activity Functional Limitation: Self care Self Care Current Status (Z8588): 0 percent impaired, limited or restricted Self Care Goal Status (F0277): 0 percent impaired, limited or restricted Self Care Discharge Status (A1287): 0 percent impaired, limited or restricted   Hulda Humphrey OTR/L [place] 03/03/2017, 4:11 PM

## 2017-03-03 NOTE — ED Triage Notes (Signed)
Patient reports from 5pm till now she had episodes of numbness and tingling in left side of body which starts from toe to face and reports drooling and feeling like tongue is swollen.  Episode lasts about 10-20 seconds and then goes away.  Also reporting a headache in back of the head for 2 days.

## 2017-03-03 NOTE — ED Notes (Signed)
Pt ambulated to restroom from room, tolerated well. 

## 2017-03-03 NOTE — H&P (Signed)
Medicine attending admission note: I personally interviewed and examined this patient and reviewed all pertinent clinical, laboratory, and imaging data, and I attest to the accuracy of the history and physical examination and management plan that will be detailed by resident physician Dr. Einar Gip which we discussed together in detail.  Clinical summary: 31 year old woman initially from [place] who is been in excellent health without any prior medical or surgical illness.  About 2 weeks ago she began to notice a burning sensation on her scalp which has persisted.  2-3 days ago she began to develop intermittent blurred vision, left facial and left upper extremity "twitching".  No dysarthria but she has had some intermittent expressive aphasia.  No diplopia.  No focal weakness.  Concomitant with the left arm symptoms she experienced chest discomfort.  On presentation to the emergency department blood pressure 109/64, pulse 78 and regular, no focal neurologic deficits. CT brain unremarkable.  MRI shows right internal carotid artery occlusion which appears to be subacute with an area of acute versus subacute ischemia in an area of more chronic ischemia involving the right frontal operculum with some involvement of the pre-central gyrus more laterally.  Asymmetric prominent patchy white matter disease adjacent to this area bilaterally. She has no signs or symptoms of a collagen vascular disorder.  She has noted an atypical dermatitis on the dorsum of her left hand and left foot intermittently over the last 3 years worse in the summer months.  No lupus-like rash, hair loss, polyarthralgia or polymyalgia.  No known thyroid disease.  No history of hepatitis or yellow jaundice.  Menstrual cycles have been regular.  She is a never smoker.  No alcohol.  No drugs.  No allergies. Her father died at age 42 of complications of asthma.  Mother alive and well at age 50.  She has 4 brothers and a sister who are  healthy.  Current exam: Blood pressure 119/80, pulse 72, temperature 97.7 F (36.5 C), temperature source Oral, resp. rate 18, height 5\' 2"  (1.575 m), weight 198 lb (89.8 kg), last menstrual period 02/13/2017, SpO2 97 %. Overweight Caucasian woman in no distress.  She is alert, oriented 3, fluent speech. Pupils equal round reactive to light.  Full extraocular movements.  No facial asymmetry.  Tongue midline.  Palate elevates symmetrically.  Motor strength 5/5 all extremities.  Upper body coordination rapid alternating movements normal.  Gait normal.  Reflexes 1+ symmetric. Regular cardiac rhythm.  No murmur gallop or click.  Carotids 1+ symmetric.  No bruits.  Radial pulses 2+ symmetric.  Ulnar pulses 1+ symmetric.  Dorsalis pedis pulses 2+ symmetric, posterior tibial pulses 1+ and symmetric.  Pertinent lab: Random glucose 464.  Repeat 353; 252 BUN 9, creatinine 0.6 Hemoglobin 11, hematocrit 32, MCV 79, white count 7200 with 67% neutrophils, 18% lymphocytes, 4 monocytes, 3 eosinophils, platelets 177,000 Troponin 0 Urine analysis: Positive glucose, 6-30 white cells, negative protein, negative blood  EKG: Sinus rhythm.  Rate 92.  Q-wave and T-wave inversion in lead III.  RSR prime and T-wave inversion in lead V1.  Impression: 1.  Cryptogenic stroke in a young woman  She will need a complete neurologic evaluation to include echocardiogram, both transthoracic and transesophageal, to rule out PFO or ASD and any structural or valvular abnormalities of the heart. CT angiogram of the neck and brain to look for vascular anomalies/vasculitis.  Currently notes systemic signs or symptoms outside of the neurologic system to suggest vasculitis. Evaluation to exclude coagulopathy to primarily include evaluation  for the presence or absence of antiphospholipid antibodies and a lupus type anticoagulant. Chest x-ray Follow-up EKG.  Lipid profile.  Exclude premature atherosclerosis.  2.  Previously  undiagnosed type 2 diabetes Check fasting blood glucose and hemoglobin A1c.  Treatment as indicated.  3.  Atypical dermatitis dorsa left hand and left foot.  Appears eczematous.  Likely unrelated to her neurologic symptoms.

## 2017-03-03 NOTE — ED Notes (Signed)
Attempted report 

## 2017-03-03 NOTE — ED Notes (Signed)
Patient transported to MRI 

## 2017-03-03 NOTE — Progress Notes (Signed)
Patient has had two episodes of wiping blood onto toilet paper; patient does not know if its vaginal or rectal; urine in the commode is clear; denies having BM. No abd. Pain,reports recent mences; MD aware; continue to monitor for any bleeding.
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
## 2017-03-03 NOTE — Evaluation (Signed)
Physical Therapy Evaluation Patient Details Name: Joann Meyers MRN: [id number] DOB: 06-Feb-1986 Today's Date: 03/03/2017   History of Present Illness  Pt is a 31 y/o female admitted secondary to facial twitching and numbness. MRI revealed acute/subacute ischemia and occlusion of the R ICA. No pertinent PMH.  Clinical Impression  Pt presented supine in bed with HOB elevated, awake and willing to participate in therapy session. Prior to admission, pt reported she was independent with all functional mobility and ADLs. Pt works full-time. She was able to ambulate in hallways with supervision and participate in a higher level balance assessment (DGI - see below). No further acute PT needs identified at this time. PT signing off.    Follow Up Recommendations No PT follow up    Equipment Recommendations  None recommended by PT    Recommendations for Other Services       Precautions / Restrictions Precautions Precautions: None Restrictions Weight Bearing Restrictions: No      Mobility  Bed Mobility Overal bed mobility: Independent                Transfers Overall transfer level: Modified independent Equipment used: None                Ambulation/Gait Ambulation/Gait assistance: Supervision Ambulation Distance (Feet): 400 Feet Assistive device: None Gait Pattern/deviations: Step-through pattern;WFL(Within Functional Limits) Gait velocity: WNL Gait velocity interpretation: at or above normal speed for age/gender General Gait Details: no instability or LOB  Stairs            Wheelchair Mobility    Modified Rankin (Stroke Patients Only) Modified Rankin (Stroke Patients Only) Pre-Morbid Rankin Score: No symptoms Modified Rankin: No symptoms     Balance Overall balance assessment: Needs assistance Sitting-balance support: Feet supported Sitting balance-Leahy Scale: Normal     Standing balance support: During functional activity;No upper extremity  supported Standing balance-Leahy Scale: Good                   Standardized Balance Assessment Standardized Balance Assessment : Dynamic Gait Index   Dynamic Gait Index Level Surface: Normal Change in Gait Speed: Normal Gait with Horizontal Head Turns: Normal Gait with Vertical Head Turns: Normal Gait and Pivot Turn: Normal Step Over Obstacle: Normal Step Around Obstacles: Normal Steps: Mild Impairment Total Score: 23       Pertinent Vitals/Pain Pain Assessment: No/denies pain    Home Living Family/patient expects to be discharged to:: Private residence Living Arrangements: Children;Parent Available Help at Discharge: Family;Available 24 hours/day Type of Home: House Home Access: Stairs to enter Entrance Stairs-Rails: Left Entrance Stairs-Number of Steps: 4 Home Layout: One level Home Equipment: None      Prior Function Level of Independence: Independent               Hand Dominance   Dominant Hand: Right    Extremity/Trunk Assessment   Upper Extremity Assessment Upper Extremity Assessment: Overall WFL for tasks assessed    Lower Extremity Assessment Lower Extremity Assessment: Overall WFL for tasks assessed    Cervical / Trunk Assessment Cervical / Trunk Assessment: Normal  Communication   Communication: No difficulties  Cognition Arousal/Alertness: Awake/alert Behavior During Therapy: WFL for tasks assessed/performed Overall Cognitive Status: Within Functional Limits for tasks assessed                                        General Comments  Exercises     Assessment/Plan    PT Assessment Patent does not need any further PT services  PT Problem List         PT Treatment Interventions      PT Goals (Current goals can be found in the Care Plan section)  Acute Rehab PT Goals Patient Stated Goal: return home    Frequency     Barriers to discharge        Co-evaluation               AM-PAC PT  "6 Clicks" Daily Activity  Outcome Measure Difficulty turning over in bed (including adjusting bedclothes, sheets and blankets)?: None Difficulty moving from lying on back to sitting on the side of the bed? : None Difficulty sitting down on and standing up from a chair with arms (e.g., wheelchair, bedside commode, etc,.)?: None Help needed moving to and from a bed to chair (including a wheelchair)?: None Help needed walking in hospital room?: None Help needed climbing 3-5 steps with a railing? : None 6 Click Score: 24    End of Session   Activity Tolerance: Patient tolerated treatment well Patient left: in bed;with call bell/phone within reach;Other (comment) (medical team present) Nurse Communication: Mobility status PT Visit Diagnosis: Other symptoms and signs involving the nervous system (L97.471)    Time: 8550-1586 PT Time Calculation (min) (ACUTE ONLY): 10 min   Charges:   PT Evaluation $PT Eval Moderate Complexity: 1 Procedure     PT G Codes:        Sherie Don, PT, DPT [place] 03/03/2017, 12:52 PM

## 2017-03-03 NOTE — ED Provider Notes (Signed)
[facility] DEPT Provider Note   CSN: [id number] Arrival date & time: 03/02/17  2347     History   Chief Complaint Chief Complaint  Patient presents with  . Shortness of Breath    HPI Joann Meyers is a 31 y.o. female.  HPI  31 year old female presents with a chief complaint of facial twitching and numbness. She states this has been ongoing for last 2 days. It is intermittent. Since 5 PM last night his occurred about 6 times. Typically last about 15 seconds at a time. She feels diffuse left facial twitching and numbness in her left eye becomes blurry. During this time she is unable to speak and has garbled speech. It feels like her tongue is swollen although when she looks in the marriage is not swollen. She also has trouble breathing during these 15 seconds. Otherwise no shortness of breath. She's been having a headache that comes and goes and is diffuse for about 2 weeks. Headache is not particularly worse during this time. She has a history of migraines whenever she has her menstrual cycle but this is different. During these episodes she will also get diffuse left-sided body numbness including her arm, leg, and flank. There is no associated weakness. No known history of seizures. Tylenol has not helped the headache.  History reviewed. No pertinent past medical history.  Patient Active Problem List   Diagnosis Date Noted  . INFECTION, URINARY TRACT NOS 07/12/2007    History reviewed. No pertinent surgical history.  OB History    No data available       Home Medications    Prior to Admission medications   Medication Sig Start Date End Date Taking? Authorizing Provider  clindamycin (CLEOCIN) 150 MG capsule Take 2 capsules (300 mg total) by mouth 3 (three) times daily. Patient not taking: Reported on 03/03/2017 11/13/15   Little, Wenda Overland, MD  clotrimazole (LOTRIMIN) 1 % cream Apply to affected area 2 times daily Patient not taking: Reported on 11/13/2015 07/21/15    Jeannett Senior, PA-C  metFORMIN (GLUCOPHAGE) 500 MG tablet Take 1 tablet (500 mg total) by mouth 2 (two) times daily with a meal. Patient not taking: Reported on 03/03/2017 11/13/15   Little, Wenda Overland, MD    Family History Family History  Problem Relation Age of Onset  . Diabetes Father   . Asthma Father   . COPD Father     Social History Social History  Substance Use Topics  . Smoking status: Never Smoker  . Smokeless tobacco: Never Used  . Alcohol use No     Allergies   Shellfish allergy   Review of Systems Review of Systems  Eyes: Negative for photophobia.  Respiratory: Positive for shortness of breath.   Gastrointestinal: Negative for vomiting.  Musculoskeletal: Negative for neck pain and neck stiffness.  Neurological: Positive for speech difficulty, numbness and headaches. Negative for weakness.  All other systems reviewed and are negative.    Physical Exam Updated Vital Signs BP 109/64   Pulse 78   Temp 99 F (37.2 C) (Oral)   Resp (!) 23   Ht 5\' 2"  (1.575 m)   Wt 198 lb (89.8 kg)   LMP 02/13/2017   SpO2 99%   BMI 36.21 kg/m   Physical Exam  Constitutional: She is oriented to person, place, and time. She appears well-developed and well-nourished. No distress.  obese  HENT:  Head: Normocephalic and atraumatic.  Right Ear: External ear normal.  Left Ear: External ear normal.  Nose:  Nose normal.  Eyes: EOM are normal. Pupils are equal, round, and reactive to light. Right eye exhibits no discharge. Left eye exhibits no discharge.  Neck: Normal range of motion. Neck supple.  Cardiovascular: Normal rate, regular rhythm and normal heart sounds.   Pulmonary/Chest: Effort normal and breath sounds normal.  Abdominal: Soft. There is no tenderness.  Neurological: She is alert and oriented to person, place, and time.  CN 3-12 grossly intact. 5/5 strength in all 4 extremities. Grossly normal sensation. Normal finger to nose.   Skin: Skin is warm and  dry. She is not diaphoretic.  Nursing note and vitals reviewed.    ED Treatments / Results  Labs (all labs ordered are listed, but only abnormal results are displayed) Labs Reviewed  CBC - Abnormal; Notable for the following:       Result Value   Hemoglobin 11.0 (*)    HCT 32.9 (*)    RDW 15.6 (*)    All other components within normal limits  BASIC METABOLIC PANEL - Abnormal; Notable for the following:    Sodium 134 (*)    CO2 20 (*)    Glucose, Bld 464 (*)    All other components within normal limits  RAPID URINE DRUG SCREEN, HOSP PERFORMED  I-STAT BETA HCG BLOOD, ED ([facility], [facility], [facility] ONLY)    EKG  EKG Interpretation  Date/Time:  Friday Mar 02 2017 23:52:32 EDT Ventricular Rate:  96 PR Interval:  140 QRS Duration: 96 QT Interval:  358 QTC Calculation: 452 R Axis:   24 Text Interpretation:  Normal sinus rhythm Cannot rule out Anterior infarct , age undetermined Abnormal ECG nonspecific T waves. No old tracing to compare Confirmed by Sherwood Gambler [phone]) on 03/03/2017 3:24:58 AM       Radiology Ct Head Wo Contrast  Result Date: 03/03/2017 CLINICAL DATA:  Seizure like symptoms 2 days ago. Left-sided facial twitching and numbness. EXAM: CT HEAD WITHOUT CONTRAST TECHNIQUE: Contiguous axial images were obtained from the base of the skull through the vertex without intravenous contrast. COMPARISON:  09/23/2013 FINDINGS: Brain: No evidence of acute infarction, hemorrhage, hydrocephalus, extra-axial collection or mass lesion/mass effect. Vascular: No hyperdense vessel or unexpected calcification. Skull: Normal. Negative for fracture or focal lesion. Sinuses/Orbits: No acute finding. Other: Small cutaneous right scalp nodule involving the anterior right parietal scalp. IMPRESSION: No acute intracranial abnormality. Tiny cutaneous parietal scalp nodule on the right. Electronically Signed   By: Ashley Royalty M.D.   On: 03/03/2017 03:58    Procedures Procedures (including critical care  time)  Medications Ordered in ED Medications  ketorolac (TORADOL) 30 MG/ML injection 30 mg (30 mg Intravenous Given 03/03/17 0455)  sodium chloride 0.9 % bolus 1,000 mL (1,000 mLs Intravenous New Bag/Given 03/03/17 8756)  sodium chloride 0.9 % bolus 1,000 mL (0 mLs Intravenous Stopped 03/03/17 0614)  metoCLOPramide (REGLAN) injection 10 mg (10 mg Intravenous Given 03/03/17 0455)  diphenhydrAMINE (BENADRYL) injection 25 mg (25 mg Intravenous Given 03/03/17 0456)     Initial Impression / Assessment and Plan / ED Course  I have reviewed the triage vital signs and the nursing notes.  Pertinent labs & imaging results that were available during my care of the patient were reviewed by me and considered in my medical decision making (see chart for details).     Differential at this time includes partial intermittent seizures, complex migraine, multiple sclerosis. CT is negative. Current neurologic exam is normal and she is not actively having symptoms. MRI will be obtained.  If MRI is negative, plan is to consult neurology for possible seizures. Discussed with Dr. Cathleen Fears, will follow up on MRI results and discuss with neurology.  Final Clinical Impressions(s) / ED Diagnoses   Final diagnoses:  None    New Prescriptions New Prescriptions   No medications on file     Sherwood Gambler, MD 03/03/17 [phone]

## 2017-03-04 ENCOUNTER — Inpatient Hospital Stay (HOSPITAL_COMMUNITY): Payer: Self-pay

## 2017-03-04 DIAGNOSIS — I7771 Dissection of carotid artery: Secondary | ICD-10-CM

## 2017-03-04 DIAGNOSIS — I63031 Cerebral infarction due to thrombosis of right carotid artery: Secondary | ICD-10-CM

## 2017-03-04 DIAGNOSIS — E119 Type 2 diabetes mellitus without complications: Secondary | ICD-10-CM

## 2017-03-04 DIAGNOSIS — I360 Nonrheumatic tricuspid (valve) stenosis: Secondary | ICD-10-CM

## 2017-03-04 DIAGNOSIS — R58 Hemorrhage, not elsewhere classified: Secondary | ICD-10-CM

## 2017-03-04 DIAGNOSIS — G514 Facial myokymia: Secondary | ICD-10-CM

## 2017-03-04 LAB — BASIC METABOLIC PANEL
Anion gap: 7 (ref 5–15)
BUN: 7 mg/dL (ref 6–20)
CO2: 20 mmol/L — AB (ref 22–32)
CREATININE: 0.46 mg/dL (ref 0.44–1.00)
Calcium: 8.4 mg/dL — ABNORMAL LOW (ref 8.9–10.3)
Chloride: 108 mmol/L (ref 101–111)
GFR calc Af Amer: >60 mL/min (ref >60)
GFR calc non Af Amer: >60 mL/min (ref >60)
GLUCOSE: 266 mg/dL — AB (ref 65–99)
Potassium: 3.8 mmol/L (ref 3.5–5.1)
Sodium: 135 mmol/L (ref 135–145)

## 2017-03-04 LAB — GLUCOSE, CAPILLARY
GLUCOSE-CAPILLARY: 241 mg/dL — AB (ref 65–99)
Glucose-Capillary: 260 mg/dL — ABNORMAL HIGH (ref 65–99)
Glucose-Capillary: 220 mg/dL — ABNORMAL HIGH (ref 65–99)
Glucose-Capillary: 311 mg/dL — ABNORMAL HIGH (ref 65–99)

## 2017-03-04 LAB — HIV ANTIBODY (ROUTINE TESTING W REFLEX): HIV SCREEN 4TH GENERATION: NONREACTIVE

## 2017-03-04 LAB — LIPID PANEL
CHOL/HDL RATIO: 4.8 ratio
Cholesterol: 111 mg/dL (ref 0–200)
HDL: 23 mg/dL — AB (ref >40)
LDL Cholesterol: 45 mg/dL (ref 0–99)
Triglycerides: 217 mg/dL — ABNORMAL HIGH (ref <150)
VLDL: 43 mg/dL — AB (ref 0–40)

## 2017-03-04 LAB — SEDIMENTATION RATE: SED RATE: 18 mm/h (ref 0–22)

## 2017-03-04 LAB — HCG, SERUM, QUALITATIVE: Preg, Serum: NEGATIVE

## 2017-03-04 LAB — ECHOCARDIOGRAM COMPLETE
HEIGHTINCHES: 62 in
WEIGHTICAEL: 3168 [oz_av]

## 2017-03-04 LAB — RHEUMATOID FACTOR

## 2017-03-04 LAB — C-REACTIVE PROTEIN: CRP: <0.8 mg/dL (ref <1.0)

## 2017-03-04 MED ORDER — SODIUM CHLORIDE 0.9 % IV SOLN
INTRAVENOUS | Status: DC
  Administered 2017-03-05: via INTRAVENOUS

## 2017-03-04 MED ORDER — PERFLUTREN LIPID MICROSPHERE
1.0000 mL | INTRAVENOUS | Status: AC | PRN
  Administered 2017-03-04: 2 mL via INTRAVENOUS
  Filled 2017-03-04: qty 10

## 2017-03-04 MED ORDER — INSULIN ASPART 100 UNIT/ML ~~LOC~~ SOLN
0.0000 [IU] | Freq: Every day | SUBCUTANEOUS | Status: DC
  Administered 2017-03-04: 4 [IU] via SUBCUTANEOUS

## 2017-03-04 MED ORDER — INSULIN ASPART 100 UNIT/ML ~~LOC~~ SOLN
0.0000 [IU] | Freq: Three times a day (TID) | SUBCUTANEOUS | Status: DC
  Administered 2017-03-05: 15 [IU] via SUBCUTANEOUS

## 2017-03-04 MED ORDER — CLOPIDOGREL BISULFATE 75 MG PO TABS
75.0000 mg | ORAL_TABLET | Freq: Every day | ORAL | Status: DC
  Administered 2017-03-04 – 2017-03-05 (×2): 75 mg via ORAL
  Filled 2017-03-04 (×2): qty 1

## 2017-03-04 MED ORDER — LIVING WELL WITH DIABETES BOOK
Freq: Once | Status: DC
  Filled 2017-03-04: qty 1

## 2017-03-04 MED ORDER — MELOXICAM 7.5 MG PO TABS
15.0000 mg | ORAL_TABLET | Freq: Every day | ORAL | Status: DC | PRN
  Administered 2017-03-04: 15 mg via ORAL
  Filled 2017-03-04: qty 2

## 2017-03-04 MED ORDER — INSULIN STARTER KIT- PEN NEEDLES (ENGLISH)
1.0000 | Freq: Once | Status: DC
  Filled 2017-03-04: qty 1

## 2017-03-04 MED ORDER — SODIUM CHLORIDE 0.9 % IV SOLN
INTRAVENOUS | Status: AC
  Administered 2017-03-04: 07:00:00 via INTRAVENOUS

## 2017-03-04 MED ORDER — FAMOTIDINE 20 MG PO TABS
20.0000 mg | ORAL_TABLET | Freq: Two times a day (BID) | ORAL | Status: DC
  Administered 2017-03-04 – 2017-03-05 (×2): 20 mg via ORAL
  Filled 2017-03-04 (×3): qty 1

## 2017-03-04 MED ORDER — INSULIN ASPART 100 UNIT/ML ~~LOC~~ SOLN
0.0000 [IU] | Freq: Three times a day (TID) | SUBCUTANEOUS | Status: DC
  Administered 2017-03-04: 5 [IU] via SUBCUTANEOUS
  Administered 2017-03-04: 8 [IU] via SUBCUTANEOUS
  Administered 2017-03-04: 5 [IU] via SUBCUTANEOUS
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
## 2017-03-04 NOTE — Procedures (Signed)
Electroencephalogram (EEG) Report  Date of study: 03/04/17  Requesting clinician: Rosalin Hawking, MD  Reason for study: Evaluate for seizure  Brief clinical history: This is a 31 year old woman who reports recurrent transient episodes of left sided tingling lasting 15 seconds with resolution. Imaging has revealed acute ischemic infarction. EEG is now being performed for further evaluation.  Medications:  Current Facility-Administered Medications:  .  [START ON 03/05/2017] 0.9 %  sodium chloride infusion, , Intravenous, Continuous, Rosalin Hawking, MD .  acetaminophen (TYLENOL) tablet 650 mg, 650 mg, Oral, Q4H PRN, 650 mg at 03/03/17 2010 **OR** acetaminophen (TYLENOL) solution 650 mg, 650 mg, Per Tube, Q4H PRN **OR** acetaminophen (TYLENOL) suppository 650 mg, 650 mg, Rectal, Q4H PRN, Rice, Resa Miner, MD .  aspirin EC tablet 325 mg, 325 mg, Oral, Daily, Rosalin Hawking, MD, 325 mg at 03/04/17 1105 .  atorvastatin (LIPITOR) tablet 80 mg, 80 mg, Oral, q1800, Rosalin Hawking, MD, 80 mg at 03/03/17 2147 .  clopidogrel (PLAVIX) tablet 75 mg, 75 mg, Oral, Daily, Rosalin Hawking, MD, 75 mg at 03/04/17 1523 .  enoxaparin (LOVENOX) injection 40 mg, 40 mg, Subcutaneous, Daily, Rice, Resa Miner, MD, 40 mg at 03/04/17 1105 .  famotidine (PEPCID) tablet 20 mg, 20 mg, Oral, BID, Amin, Sumayya, MD .  insulin aspart (novoLOG) injection 0-15 Units, 0-15 Units, Subcutaneous, TID WC, Molt, Bethany, DO, 8 Units at 03/04/17 1200 .  meloxicam (MOBIC) tablet 15 mg, 15 mg, Oral, Daily PRN, Molt, Bethany, DO, 15 mg at 03/04/17 1229 .  senna-docusate (Senokot-S) tablet 1 tablet, 1 tablet, Oral, QHS PRN, Rice, Resa Miner, MD  Description: This is a routine EEG performed using standard international 10-20 electrode placement. A total of 18 channels are recorded, including one for the EKG. The patient is awake and drowsy during the recording. Sleep is not attained on the study.   Activating Maneuvers:  None  Findings:  The EKG  channel demonstrates a regular rhythm with a rate of 80 beats per minute.   The background consists of well-formed alpha activity. The best dominant posterior rhythm is 11-12 hertz. This is symmetric and reacts as expected with eye opening. Voltages are mildly reduced throughout.  There is a mild degree of intermixed theta activity in the right central and temporal regions. No epileptiform discharges are present. No seizures are recorded.   Drowsiness is recorded and is normal in appearance.   Impression:  This is a slightly abnormal EEG due to the presence of mild intermixed slowing in the right central and temporal regions. No epileptiform abnormalities.  Clinical correlation: This EEG suggests focal cerebral dysfunction in the right cerebral hemisphere, specifically the central and temporal regions. This could be due to the known occlusion of the right internal carotid artery with relatively reduced blood flow in the right MCA territory. Clinical correlation is recommended.   Melba Coon, MD [facility]
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
## 2017-03-04 NOTE — Progress Notes (Signed)
  Echocardiogram 2D Echocardiogram with definity has been performed.  Joann Meyers L Androw 03/04/2017, 9:59 AM

## 2017-03-04 NOTE — Progress Notes (Signed)
Patient reporting bleeding in her underwear; will notify MD; she states she just had a cycle on 02-14-17

## 2017-03-04 NOTE — Progress Notes (Signed)
EEG Completed; Results Pending  

## 2017-03-05 ENCOUNTER — Inpatient Hospital Stay (HOSPITAL_COMMUNITY): Payer: Self-pay

## 2017-03-05 ENCOUNTER — Telehealth: Payer: Self-pay

## 2017-03-05 DIAGNOSIS — I63231 Cerebral infarction due to unspecified occlusion or stenosis of right carotid arteries: Secondary | ICD-10-CM

## 2017-03-05 HISTORY — PX: IR ANGIOGRAM EXTREMITY LEFT: IMG651

## 2017-03-05 HISTORY — PX: IR ANGIO INTRA EXTRACRAN SEL COM CAROTID INNOMINATE BILAT MOD SED: IMG5360

## 2017-03-05 HISTORY — PX: IR ANGIO VERTEBRAL SEL VERTEBRAL BILAT MOD SED: IMG5369

## 2017-03-05 LAB — PROTEIN C ACTIVITY: PROTEIN C ACTIVITY: 108 % (ref 73–180)

## 2017-03-05 LAB — MPO/PR-3 (ANCA) ANTIBODIES
ANCA Proteinase 3: <3.5 U/mL (ref 0.0–3.5)
Myeloperoxidase Abs: <9 U/mL (ref 0.0–9.0)

## 2017-03-05 LAB — GLUCOSE, CAPILLARY
GLUCOSE-CAPILLARY: 263 mg/dL — AB (ref 65–99)
GLUCOSE-CAPILLARY: 379 mg/dL — AB (ref 65–99)

## 2017-03-05 LAB — SJOGRENS SYNDROME-A EXTRACTABLE NUCLEAR ANTIBODY

## 2017-03-05 LAB — ANA W/REFLEX IF POSITIVE: Anti Nuclear Antibody(ANA): NEGATIVE

## 2017-03-05 LAB — HEMOGLOBIN A1C
HEMOGLOBIN A1C: 9.7 % — AB (ref 4.8–5.6)
MEAN PLASMA GLUCOSE: 232 mg/dL

## 2017-03-05 LAB — SJOGRENS SYNDROME-B EXTRACTABLE NUCLEAR ANTIBODY: SSB (La) (ENA) Antibody, IgG: <0.2 AI (ref 0.0–0.9)

## 2017-03-05 LAB — VON WILLEBRAND PANEL
COAGULATION FACTOR VIII: 167 % — AB (ref 57–163)
Ristocetin Co-factor, Plasma: 112 % (ref 50–200)
VON WILLEBRAND ANTIGEN, PLASMA: 164 % (ref 50–200)

## 2017-03-05 LAB — HOMOCYSTEINE: HOMOCYSTEINE-NORM: 6.3 umol/L (ref 0.0–15.0)

## 2017-03-05 LAB — PROTEIN S ACTIVITY: PROTEIN S ACTIVITY: 92 % (ref 63–140)

## 2017-03-05 LAB — PROTEIN C, TOTAL: PROTEIN C, TOTAL: 89 % (ref 60–150)

## 2017-03-05 LAB — COAG STUDIES INTERP REPORT

## 2017-03-05 MED ORDER — FENTANYL CITRATE (PF) 100 MCG/2ML IJ SOLN
INTRAMUSCULAR | Status: AC
  Filled 2017-03-05: qty 2

## 2017-03-05 MED ORDER — DIPHENHYDRAMINE HCL 50 MG/ML IJ SOLN
INTRAMUSCULAR | Status: AC
  Filled 2017-03-05: qty 1

## 2017-03-05 MED ORDER — FENTANYL CITRATE (PF) 100 MCG/2ML IJ SOLN
INTRAMUSCULAR | Status: AC | PRN
  Administered 2017-03-05 (×2): 25 ug via INTRAVENOUS

## 2017-03-05 MED ORDER — DIPHENHYDRAMINE HCL 50 MG/ML IJ SOLN
INTRAMUSCULAR | Status: AC
  Administered 2017-03-05: 50 mg via INTRAVENOUS
  Filled 2017-03-05: qty 1

## 2017-03-05 MED ORDER — METFORMIN HCL 500 MG PO TABS: 500.0000 mg | ORAL_TABLET | Freq: Every day | ORAL | 0 refills | Status: DC

## 2017-03-05 MED ORDER — IOPAMIDOL (ISOVUE-300) INJECTION 61%
96.0000 mL | Freq: Once | INTRAVENOUS | Status: AC | PRN
  Administered 2017-03-05: 96 mL via INTRA_ARTERIAL

## 2017-03-05 MED ORDER — LIVING WELL WITH DIABETES BOOK: 1.0000 | Freq: Once | 0 refills | Status: AC

## 2017-03-05 MED ORDER — CLOPIDOGREL BISULFATE 75 MG PO TABS: 75.0000 mg | ORAL_TABLET | Freq: Every day | ORAL | 0 refills | Status: DC

## 2017-03-05 MED ORDER — MIDAZOLAM HCL 2 MG/2ML IJ SOLN
INTRAMUSCULAR | Status: AC | PRN
  Administered 2017-03-05: 1 mg via INTRAVENOUS

## 2017-03-05 MED ORDER — METHYLPREDNISOLONE SODIUM SUCC 125 MG IJ SOLR
INTRAMUSCULAR | Status: AC
  Administered 2017-03-05: 125 mg via INTRAVENOUS
  Filled 2017-03-05: qty 2

## 2017-03-05 MED ORDER — LIDOCAINE HCL 1 % IJ SOLN
INTRAMUSCULAR | Status: AC
  Filled 2017-03-05: qty 20

## 2017-03-05 MED ORDER — ASPIRIN 325 MG PO TBEC: 325.0000 mg | DELAYED_RELEASE_TABLET | Freq: Every day | ORAL | 0 refills | Status: DC

## 2017-03-05 MED ORDER — INSULIN ASPART 100 UNIT/ML ~~LOC~~ SOLN
6.0000 [IU] | Freq: Once | SUBCUTANEOUS | Status: AC
  Administered 2017-03-05: 6 [IU] via SUBCUTANEOUS

## 2017-03-05 MED ORDER — HEPARIN SODIUM (PORCINE) 1000 UNIT/ML IJ SOLN
INTRAMUSCULAR | Status: AC
  Filled 2017-03-05: qty 1

## 2017-03-05 MED ORDER — MIDAZOLAM HCL 2 MG/2ML IJ SOLN
INTRAMUSCULAR | Status: AC
  Filled 2017-03-05: qty 2

## 2017-03-05 MED ORDER — IOPAMIDOL (ISOVUE-300) INJECTION 61%
INTRAVENOUS | Status: AC
  Filled 2017-03-05: qty 50

## 2017-03-05 MED ORDER — DIPHENHYDRAMINE HCL 50 MG/ML IJ SOLN
50.0000 mg | Freq: Once | INTRAMUSCULAR | Status: AC
Start: 2017-03-05 — End: 2017-03-05
  Administered 2017-03-05: 50 mg via INTRAVENOUS

## 2017-03-05 MED ORDER — ENOXAPARIN SODIUM 40 MG/0.4ML ~~LOC~~ SOLN: 40.0000 mg | Freq: Every day | SUBCUTANEOUS | Status: DC

## 2017-03-05 MED ORDER — ATORVASTATIN CALCIUM 80 MG PO TABS: 80.0000 mg | ORAL_TABLET | Freq: Every day | ORAL | 0 refills | Status: DC

## 2017-03-05 MED ORDER — METHYLPREDNISOLONE SODIUM SUCC 125 MG IJ SOLR
125.0000 mg | Freq: Once | INTRAMUSCULAR | Status: AC
  Administered 2017-03-05: 125 mg via INTRAVENOUS

## 2017-03-05 MED ORDER — DIPHENHYDRAMINE HCL 50 MG/ML IJ SOLN: 50.0000 mg | Freq: Once | INTRAMUSCULAR | Status: DC

## 2017-03-05 MED ORDER — HEPARIN SODIUM (PORCINE) 1000 UNIT/ML IJ SOLN
INTRAMUSCULAR | Status: AC | PRN
  Administered 2017-03-05: 1000 [IU] via INTRAVENOUS

## 2017-03-05 MED ORDER — IOPAMIDOL (ISOVUE-300) INJECTION 61%
INTRAVENOUS | Status: AC
  Administered 2017-03-05: 96 mL via INTRA_ARTERIAL
  Filled 2017-03-05: qty 150

## 2017-03-05 MED ORDER — LIDOCAINE HCL 1 % IJ SOLN
INTRAMUSCULAR | Status: AC | PRN
  Administered 2017-03-05: 5 mL

## 2017-03-05 MED ORDER — GLIPIZIDE 5 MG PO TABS
2.5000 mg | ORAL_TABLET | Freq: Every day | ORAL | 0 refills | Status: DC
Start: 2017-03-05 — End: 2017-04-10

## 2017-03-05 MED ORDER — METFORMIN HCL 500 MG PO TABS: 500.0000 mg | ORAL_TABLET | Freq: Two times a day (BID) | ORAL | 0 refills | Status: DC

## 2017-03-05 NOTE — Progress Notes (Signed)
*  PRELIMINARY RESULTS* Vascular Ultrasound Transcranial Doppler with Monitoring- Vasomotor Reactivity has been completed with Dr. Erlinda Hong. Study was technically difficult on the right due to small MCA being difficult to insonate, therefore breath holding index is unreliable.  03/05/2017 4:25 PM Maudry Mayhew, BS, RVT, RDCS, RDMS

## 2017-03-05 NOTE — Progress Notes (Addendum)
Inpatient Diabetes Program Recommendations  AACE/ADA: New Consensus Statement on Inpatient Glycemic Control (2015)  Target Ranges:  Prepandial:   less than 140 mg/dL      Peak postprandial:   less than 180 mg/dL (1-2 hours)      Critically ill patients:  140 - 180 mg/dL   Lab Results  Component Value Date   GLUCAP 263 (H) 03/05/2017   HGBA1C 9.7 (H) 03/04/2017    Review of Glycemic Control Results for Joann Meyers, Joann Meyers (MRN [id number]) as of 03/05/2017 14:47  Ref. Range 03/04/2017 06:27 03/04/2017 11:51 03/04/2017 16:38 03/04/2017 21:58 03/05/2017 06:46  Glucose-Capillary Latest Ref Range: 65 - 99 mg/dL 241 (H) 260 (H) 220 (H) 311 (H) 263 (H)   Inpatient Diabetes Program Recommendations:   Spoke with pt about new diagnosis. Discussed A1C results 9.7 with them and explained what an A1C is, basic pathophysiology of DM Type 2, basic home care, basic diabetes diet nutrition principles, importance of checking CBGs and maintaining good CBG control to prevent long-term and short-term complications. Reviewed signs and symptoms of hyperglycemia and hypoglycemia and how to treat hypoglycemia at home. Also reviewed blood sugar goals at home.  RNs to provide ongoing basic DM education at bedside with this patient. Have ordered educational booklet, and DM videos. Have also placed RD consult for DM diet education for this patient.  Reviewed with patient meter and strips available @ [facility] ([employer]) for approx $25 for meter and 100 strips due to pt. Has no insurance listed. Patient said she has been drinking a lot of drinks with sugar and reviewed need to limit sugary drinks and change to water or sugar free. Please consider Metformin on discharge. If patient goes home on insulin, may order 70/30 Novolin insulin from [facility] for approx. $25. RNs, please start insulin administration teaching to prepare pt if discharged on insulin.  Thank you, Nani Gasser. Alyanah Elliott, RN, MSN, CDE  Diabetes Coordinator Inpatient Glycemic  Control Team Team Pager [phone] (8am-5pm) 03/05/2017 2:53 PM

## 2017-03-05 NOTE — Progress Notes (Signed)
Subjective: Patient seen and evaluated today at bedside. Feels well and is without complaint. No episodes of left sided facial twitching, weakness or aphasia since the night before last. Feels ready for discharge.   Objective:  Vital signs in last 24 hours: Vitals:   03/04/17 1904 03/04/17 2132 03/05/17 0212 03/05/17 0600  BP: 138/90 131/77 128/64 129/80  Pulse: 78 74 80 73  Resp: _0 Temp: 99 F (37.2 C) 98.1 F (36.7 C) 98.1 F (36.7 C) 97.9 F (36.6 C)  TempSrc: Oral Oral Oral Oral  SpO2: 99% 100% 97% 99%  Weight:      Height:       General: Overweight female resting comfortably in bed. NAD. Brother at bedside.  HENT: PERRL. EOMI. No conjunctival injection, icterus or ptosis. Speech without dysarthria.  Cardiovascular: Regular rate and rhythm. No murmur or rub appreciated. Pulmonary: CTA BL, no wheezing, crackles or rhonchi appreciated. Unlabored breathing.  Abdomen: Soft, non-tender. +bowel sounds.  Neuro: Motor strength 5/5 throughout. Sensation intact grossly.  Psych: Mood normal and affect was mood congruent. Responds to questions appropriately.   Ct Angio Head W Or Wo Contrast - Result Date: 03/03/2017  IMPRESSION: CTA NECK: Diminutive RIGHT cervical internal carotid artery most compatible with old dissection. Otherwise negative CTA NECK. CTA HEAD: Occluded RIGHT internal carotid artery at cavernous segment, reconstituted at carotid terminus via A1 segment and tiny posterior communicating artery. Patent though smaller RIGHT middle cerebral arteries compatible with hypoperfusion. No emergent large vessel occlusion.   Dg Chest 2 View - Result Date: 03/03/2017  IMPRESSION: Low lung volumes. No acute abnormality. No evidence of pulmonary edema.   Ct Angio Neck W Or Wo Contrast - Result Date: 03/03/2017  IMPRESSION: CTA NECK: Diminutive RIGHT cervical internal carotid artery most compatible with old dissection. Otherwise negative CTA NECK. Suspected pulmonary edema and  cardiomegaly, recommend chest radiograph. CTA HEAD: Occluded RIGHT internal carotid artery at cavernous segment, reconstituted at carotid terminus via A1 segment and tiny posterior communicating artery. Patent though smaller RIGHT middle cerebral arteries compatible with hypoperfusion. No emergent large vessel occlusion.  Mr Brain Wo Contrast - Result Date: 03/03/2017 IMPRESSION: 1. Acute/subacute ischemia within an area of more chronic ischemic disease involving the right frontal operculum. There is some involvement of the precentral gyrus more laterally. 2. Asymmetric prominent patchy white matter disease adjacent to this area on the right, but also present on the left. 3. Occlusion of the right internal carotid artery. 4. The combination suggests an underlying vasculitis or coagulopathy.  Mr Brain W Contrast - Result Date: 03/03/2017  IMPRESSION: Multifocal enhancement RIGHT cerebrum corresponding to subacute infarcts (subacute infarcts may enhance for up to 10 weeks). RIGHT internal carotid artery occlusion again noted.  ECHOCARDIOGRAM 03/04/17: LVEF 65-70%. No evidence for cardiac etiology.   Assessment/Plan:  Active Problems:   Stroke ([place])   Type 2 diabetes mellitus (HCC)   Facial twitching  Cryptogenic Stroke, Occlusion of R ICA, Suspicion for Chronic R ICA Dissection: Neurology on board, appreciate assistance. IR to perform angiogram today. 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 Many labs still pending which will help answer this question.  -HbA1c 9.7% -Negative: ECHO, ESR, CRP, HIV, Antithrombin, LDL, RhF -Negative: ANA, Protein C+S function, Anti-Ro, Anti-La -Continue Atorvastatin 80 mg and ASA. Neuro recs DAPT -FU arteriogram  Type 2 Diabetes: HbA1c 9.7%. CBGs 200-300 with M-SSI. Will consider starting oral agent at  discharge.  -CM diet -SSI-Moderate  Dispo: Anticipated discharge  today or tomorrow, pending results of arteriogram and perhaps possible intervention.   Nyree Applegate, DO 03/05/2017, 8:35 AM Pager: [phone]

## 2017-03-05 NOTE — Sedation Documentation (Signed)
Patient is resting comfortably. 

## 2017-03-05 NOTE — Consult Note (Signed)
Chief Complaint: Patient was seen in consultation today for cerebral arteriogram Chief Complaint  Patient presents with  . Shortness of Breath   at the request of Dr Lavera Guise  Referring Physician(s): Dr Lavera Guise  Supervising Physician: Luanne Bras  Patient Status: [facility] - In-pt  History of Present Illness: Joann Meyers is a 31 y.o. female   Pt has had recurrent left sided tingling over whole left side Started Fri night---several times 2 times on Sat and Sun. Lasts 15 seconds and goes away. No change in vision or hearing No weakness; now loss of abilities.  5/19 MR: IMPRESSION: Multifocal enhancement RIGHT cerebrum corresponding to subacute infarcts (subacute infarcts may enhance for up to 10 weeks). RIGHT internal carotid artery occlusion again noted.  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 Otherwise negative CTA NECK. Suspected pulmonary edema and cardiomegaly, recommend chest radiograph. CTA HEAD: Occluded RIGHT internal carotid artery at cavernous segment, reconstituted at carotid terminus via A1 segment and tiny posterior communicating artery. Patent though smaller RIGHT middle cerebral arteries compatible with hypoperfusion. 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 No pertinent past medical history.  History reviewed. No pertinent surgical history.  Allergies: Shellfish allergy  Medications: Prior to Admission medications   Medication Sig Start Date End Date Taking? Authorizing Provider  clindamycin (CLEOCIN) 150 MG capsule Take 2 capsules (300 mg total) by mouth 3 (three) times daily. Patient not taking: Reported on 03/03/2017 11/13/15   Little, Wenda Overland, MD  clotrimazole (LOTRIMIN) 1 % cream  Apply to affected area 2 times daily Patient not taking: Reported on 11/13/2015 07/21/15   Jeannett Senior, PA-C  metFORMIN (GLUCOPHAGE) 500 MG tablet Take 1 tablet (500 mg total) by mouth 2 (two) times daily with a meal. Patient not taking: Reported on 03/03/2017 11/13/15   Little, Wenda Overland, MD     Family History  Problem Relation Age of Onset  . Deep vein thrombosis Mother   . Diabetes Father   . Asthma Father   . COPD Father     Social History   Social History  . Marital status: Single    Spouse name: N/A  . Number of children: N/A  . Years of education: N/A   Social History Main Topics  . Smoking status: Never Smoker  . Smokeless tobacco: Never Used  . Alcohol use No  . Drug use: No  . Sexual activity: No   Other Topics Concern  . None   Social History Narrative  . None    Review of Systems: A 12 point ROS discussed and pertinent positives are indicated in the HPI above.  All other systems are negative.  Review of Systems  Constitutional: Positive for activity change. Negative for appetite change, fatigue and fever.  HENT: Negative for tinnitus and trouble swallowing.   Eyes: Negative for visual disturbance.  Respiratory: Negative for shortness of breath.   Cardiovascular: Negative for chest pain.  Gastrointestinal: Negative for abdominal pain.  Musculoskeletal: Negative for back pain and gait problem.  Neurological: Positive for seizures and headaches. Negative for dizziness, tremors, syncope, facial asymmetry, speech difficulty, weakness, light-headedness and numbness.  Psychiatric/Behavioral: Negative for behavioral problems, confusion and decreased concentration.    Vital Signs: BP 129/80 (BP Location: Left Arm)   Pulse 73   Temp 97.9 F (  36.6 C) (Oral)   Resp 18   Ht 5\' 2"  (1.575 m)   Wt 198 lb (89.8 kg)   LMP 02/13/2017   SpO2 99%   BMI 36.21 kg/m   Physical Exam  Constitutional: She is oriented to person, place, and time. She appears  well-nourished.  HENT:  Head: Atraumatic.  Eyes: EOM are normal.  Neck: Neck supple.  Cardiovascular: Normal rate, regular rhythm and normal heart sounds.   Pulmonary/Chest: Effort normal and breath sounds normal.  Abdominal: Soft. Bowel sounds are normal.  Musculoskeletal: Normal range of motion.  Neurological: She is alert and oriented to person, place, and time. She displays normal reflexes. She exhibits normal muscle tone. Coordination normal.  Skin: Skin is warm and dry.  Psychiatric: She has a normal mood and affect. Her behavior is normal. Judgment and thought content normal.  Nursing note and vitals reviewed.   Mallampati Score:  MD Evaluation Airway: WNL Heart: WNL Abdomen: WNL Chest/ Lungs: WNL ASA  Classification: 2 Mallampati/Airway Score: Two  Imaging: Ct Angio Head W Or Wo Contrast  Result Date: 03/03/2017 CLINICAL DATA:  LEFT facial numbness and twitching, word finding difficulties. Follow-up stroke. Evaluate RIGHT internal carotid artery occlusion. EXAM: CT ANGIOGRAPHY HEAD AND NECK TECHNIQUE: Multidetector CT imaging of the head and neck was performed using the standard protocol during bolus administration of intravenous contrast. Multiplanar CT image reconstructions and MIPs were obtained to evaluate the vascular anatomy. Carotid stenosis measurements (when applicable) are obtained utilizing NASCET criteria, using the distal internal carotid diameter as the denominator. CONTRAST:  50 cc Isovue 370 COMPARISON:  MRI of the head Mar 03, 2017 at 0800 hours FINDINGS: CTA NECK AORTIC ARCH: Normal appearance of the thoracic arch, 2 vessel arch is a normal variant. The LEFT vertebral artery arises directly from the aortic arch. The origins of the innominate, left Common carotid artery and subclavian artery are widely patent. RIGHT CAROTID SYSTEM: Common carotid artery is widely patent. Normal appearance of the carotid bifurcation without hemodynamically significant stenosis by  NASCET criteria. Gradual tapering and diminutive patent RIGHT cervical internal carotid artery. LEFT CAROTID SYSTEM: Common carotid artery is widely patent, and mildly asymmetrically larger than the RIGHT. Normal appearance of the carotid bifurcation without hemodynamically significant stenosis by NASCET criteria. Normal appearance of the included internal carotid artery. VERTEBRAL ARTERIES:Codominant vertebral artery's. Normal appearance of the vertebral arteries, which appear widely patent. SKELETON: No acute osseous process though bone windows have not been submitted. OTHER NECK: Soft tissues of the neck are non-acute though, not tailored for evaluation. Heterogeneous lung attenuation with suspected cardiomegaly. Subcentimeter RIGHT thyroid nodule below size followup recommendation. CTA HEAD ANTERIOR CIRCULATION: RIGHT internal carotid artery occluded within cavernous segment reconstituted via RIGHT A1 segment at the carotid terminus. Normal LEFT internal carotid artery. Patent though smaller RIGHT middle cerebral artery is. Normal appearance of the anterior cerebral arteries and LEFT middle cerebral artery. No contrast extravasation or aneurysm. POSTERIOR CIRCULATION: Patent vertebral arteries, vertebrobasilar junction and basilar artery, as well as main branch vessels. Patent posterior cerebral arteries. Tiny RIGHT, robust LEFT posterior communicating artery is present. No large vessel occlusion, hemodynamically significant stenosis, dissection, luminal irregularity, contrast extravasation or aneurysm. VENOUS SINUSES: Major dural venous sinuses are patent though not tailored for evaluation on this angiographic examination. ANATOMIC VARIANTS: None. DELAYED PHASE: Very faint enhancement RIGHT frontal cortex most compatible with subacute infarcts/luxury perfusion. MIP images reviewed. 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 Otherwise negative CTA  NECK. Suspected pulmonary edema and cardiomegaly, recommend chest radiograph. CTA HEAD: Occluded RIGHT internal carotid artery at cavernous segment, reconstituted at carotid terminus via A1 segment and tiny posterior communicating artery. Patent though smaller RIGHT middle cerebral arteries compatible with hypoperfusion. No emergent large vessel occlusion. These results will be called to the ordering clinician or representative by the Radiologist Assistant, and communication documented in the [facility] Dashboard. Electronically Signed   By: Elon Alas M.D.   On: 03/03/2017 18:38   Dg Chest 2 View  Result Date: 03/03/2017 CLINICAL DATA:  Pulmonary edema. EXAM: CHEST  2 VIEW COMPARISON:  Radiographs 09/23/2013 FINDINGS: Low lung volumes. The cardiomediastinal contours are normal. Pulmonary vasculature is normal. No consolidation, pleural effusion, or pneumothorax. No acute osseous abnormalities are seen. IMPRESSION: Low lung volumes. No acute abnormality. No evidence of pulmonary edema. Electronically Signed   By: Jeb Levering M.D.   On: 03/03/2017 21:47   Ct Head Wo Contrast  Result Date: 03/03/2017 CLINICAL DATA:  Seizure like symptoms 2 days ago. Left-sided facial twitching and numbness. EXAM: CT HEAD WITHOUT CONTRAST TECHNIQUE: Contiguous axial images were obtained from the base of the skull through the vertex without intravenous contrast. COMPARISON:  09/23/2013 FINDINGS: Brain: No evidence of acute infarction, hemorrhage, hydrocephalus, extra-axial collection or mass lesion/mass effect. Vascular: No hyperdense vessel or unexpected calcification. Skull: Normal. Negative for fracture or focal lesion. Sinuses/Orbits: No acute finding. Other: Small cutaneous right scalp nodule involving the anterior right parietal scalp. IMPRESSION: No acute intracranial abnormality. Tiny cutaneous parietal scalp nodule on the right. Electronically Signed   By: Ashley Royalty M.D.   On: 03/03/2017 03:58   Ct Angio  Neck W Or Wo Contrast  Result Date: 03/03/2017 CLINICAL DATA:  LEFT facial numbness and twitching, word finding difficulties. Follow-up stroke. Evaluate RIGHT internal carotid artery occlusion. EXAM: CT ANGIOGRAPHY HEAD AND NECK TECHNIQUE: Multidetector CT imaging of the head and neck was performed using the standard protocol during bolus administration of intravenous contrast. Multiplanar CT image reconstructions and MIPs were obtained to evaluate the vascular anatomy. Carotid stenosis measurements (when applicable) are obtained utilizing NASCET criteria, using the distal internal carotid diameter as the denominator. CONTRAST:  50 cc Isovue 370 COMPARISON:  MRI of the head Mar 03, 2017 at 0800 hours FINDINGS: CTA NECK AORTIC ARCH: Normal appearance of the thoracic arch, 2 vessel arch is a normal variant. The LEFT vertebral artery arises directly from the aortic arch. The origins of the innominate, left Common carotid artery and subclavian artery are widely patent. RIGHT CAROTID SYSTEM: Common carotid artery is widely patent. Normal appearance of the carotid bifurcation without hemodynamically significant stenosis by NASCET criteria. Gradual tapering and diminutive patent RIGHT cervical internal carotid artery. LEFT CAROTID SYSTEM: Common carotid artery is widely patent, and mildly asymmetrically larger than the RIGHT. Normal appearance of the carotid bifurcation without hemodynamically significant stenosis by NASCET criteria. Normal appearance of the included internal carotid artery. VERTEBRAL ARTERIES:Codominant vertebral artery's. Normal appearance of the vertebral arteries, which appear widely patent. SKELETON: No acute osseous process though bone windows have not been submitted. OTHER NECK: Soft tissues of the neck are non-acute though, not tailored for evaluation. Heterogeneous lung attenuation with suspected cardiomegaly. Subcentimeter RIGHT thyroid nodule below size followup recommendation. CTA HEAD  ANTERIOR CIRCULATION: RIGHT internal carotid artery occluded within cavernous segment reconstituted via RIGHT A1 segment at the carotid terminus. Normal LEFT internal carotid artery. Patent though smaller RIGHT middle cerebral artery is. Normal appearance of the anterior cerebral arteries and LEFT  middle cerebral artery. No contrast extravasation or aneurysm. POSTERIOR CIRCULATION: Patent vertebral arteries, vertebrobasilar junction and basilar artery, as well as main branch vessels. Patent posterior cerebral arteries. Tiny RIGHT, robust LEFT posterior communicating artery is present. No large vessel occlusion, hemodynamically significant stenosis, dissection, luminal irregularity, contrast extravasation or aneurysm. VENOUS SINUSES: Major dural venous sinuses are patent though not tailored for evaluation on this angiographic examination. ANATOMIC VARIANTS: None. DELAYED PHASE: Very faint enhancement RIGHT frontal cortex most compatible with subacute infarcts/luxury perfusion. MIP images reviewed. 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 Otherwise negative CTA NECK. Suspected pulmonary edema and cardiomegaly, recommend chest radiograph. CTA HEAD: Occluded RIGHT internal carotid artery at cavernous segment, reconstituted at carotid terminus via A1 segment and tiny posterior communicating artery. Patent though smaller RIGHT middle cerebral arteries compatible with hypoperfusion. No emergent large vessel occlusion. These results will be called to the ordering clinician or representative by the Radiologist Assistant, and communication documented in the [facility] Dashboard. Electronically Signed   By: Elon Alas M.D.   On: 03/03/2017 18:38   Mr Brain Wo Contrast  Result Date: 03/03/2017 CLINICAL DATA:  New onset of left facial twitching and numbness over the last 2 days. Intermittent episodes lasting up to 15 seconds. Blurred vision on the left  eye. Abnormal speech. EXAM: MRI HEAD WITHOUT CONTRAST TECHNIQUE: Multiplanar, multiecho pulse sequences of the brain and surrounding structures were obtained without intravenous contrast. COMPARISON:  CT head without contrast from the same day. FINDINGS: Brain: The diffusion-weighted images demonstrate AA focally restricted diffusion in the right frontal operculum. A focal area of cortical signal abnormality is present in the anterior right frontal lobe with decreased subcortical T2 signal abnormality. Asymmetric patchy T2 hyperintensities are noted subjacent to this area. More mild subcortical T2 hyperintensities are present on the right. No focal abnormalities are present on the left. The brainstem and cerebellum are normal. The internal auditory canals are within normal limits. Vascular: Abnormal signal is present within the right internal carotid artery. There is flow within the right MCA vessels. The left internal carotid artery is patent. The vertebrobasilar system is patent. Skull and upper cervical spine: The skullbase is within normal limits. The craniocervical junction is normal. Marrow signal is somewhat depressed. Sinuses/Orbits: The paranasal sinuses and mastoid air cells are clear. The globes and orbits are within normal limits. IMPRESSION: 1. Acute/subacute ischemia within an area of more chronic ischemic disease involving the right frontal operculum. There is some involvement of the precentral gyrus more laterally. 2. Asymmetric prominent patchy white matter disease adjacent to this area on the right, but also present on the left. 3. Occlusion of the right internal carotid artery. 4. The combination suggests an underlying vasculitis or coagulopathy. These results were called by telephone at the time of interpretation on 03/03/2017 at 8:37 am to Dr. Winfred Leeds , who verbally acknowledged these results. Electronically Signed   By: San Morelle M.D.   On: 03/03/2017 08:39   Mr Brain W  Contrast  Result Date: 03/03/2017 CLINICAL DATA:  Followup strokes. Headache and facial twitching. Known RIGHT internal carotid artery occlusion. EXAM: MRI HEAD WITH CONTRAST TECHNIQUE: Multiplanar, multiecho pulse sequences of the brain and surrounding structures were obtained with intravenous contrast. CONTRAST:  91mL MULTIHANCE GADOBENATE DIMEGLUMINE 529 MG/ML IV SOLN COMPARISON:  Noncontrast MRI head Mar 03, 2017 at 0807 hours FINDINGS: BRAIN: Fuzzy to patchy enhancement RIGHT frontal lobe, RIGHT periatrial/parietal white matter and RIGHT temporal lobe corresponding to infarcts on  recent MRI. Overlying mild dural enhancement most compatible with venous congestion. No masslike enhancement. No abnormal extra-axial enhancement. VASCULAR: Enhancing RIGHT internal carotid artery corresponding to segments of known occlusion. SKULL AND UPPER CERVICAL SPINE: No abnormal sellar or enhancement though not tailored for evaluation. SINUSES/ORBITS: Not tailored for evaluation. OTHER: None. IMPRESSION: Multifocal enhancement RIGHT cerebrum corresponding to subacute infarcts (subacute infarcts may enhance for up to 10 weeks). RIGHT internal carotid artery occlusion again noted. Electronically Signed   By: Elon Alas M.D.   On: 03/03/2017 21:39    Labs:  CBC:  Recent Labs  03/03/17 0022  WBC 7.2  HGB 11.0*  HCT 32.9*  PLT 177    COAGS:  Recent Labs  03/03/17 0903  INR 1.04  APTT 25    BMP:  Recent Labs  03/03/17 0022 03/04/17 0557  NA 134* 135  K 3.6 3.8  CL 104 108  CO2 20* 20*  GLUCOSE 464* 266*  BUN 9 7  CALCIUM 9.2 8.4*  CREATININE 0.56 0.46  GFRNONAA >60 >60  GFRAA >60 >60    LIVER FUNCTION TESTS: No results for input(s): BILITOT, AST, ALT, ALKPHOS, PROT, ALBUMIN in the last 8760 hours.  TUMOR MARKERS: No results for input(s): AFPTM, CEA, CA199, CHROMGRNA in the last 8760 hours.  Assessment and Plan:  Left sided tingling x 15 seconds each time---many times over  last few days. + CVA on MR CTA abnormal; R ICA stenosis/occlusion Scheduled now for cerebral arteriogram for further evaluation (pre medicated with Solumedrol 125 mg and Benadryl 50 mg IV prior to procedure) Risks and Benefits discussed with the patient including, but not limited to bleeding, infection, vascular injury, contrast induced renal failure, stroke or even death. All of the patient's questions were answered, patient is agreeable to proceed. Consent signed and in chart.  Thank you for this interesting consult.  I greatly enjoyed meeting Reynolds Ismaili and look forward to participating in their care.  A copy of this report was sent to the requesting provider on this date.  Electronically Signed: Lavonia Drafts, PA-C 03/05/2017, 10:02 AM   I spent a total of 40 Minutes    in face to face in clinical consultation, greater than 50% of which was counseling/coordinating care for cerebral arteriogram

## 2017-03-05 NOTE — Discharge Summary (Signed)
Name: Joann Meyers MRN: [id number] DOB: February 06, 1986 31 y.o. PCP: Patient, No Pcp Per  Date of Admission: 03/03/2017  3:18 AM Date of Discharge: 03/05/2017 Attending Physician: Aldine Contes, MD  Discharge Diagnosis: 1. Stroke 2. Dissection of Right Internal Carotid Artery 3. Type 2 Diabetes  Active Problems:   Stroke ([place])   Type 2 diabetes mellitus (HCC)   Facial twitching   Discharge Medications: Allergies as of 03/05/2017      Reactions   Shellfish Allergy Anaphylaxis      Medication List    STOP taking these medications   clindamycin 150 MG capsule Commonly known as:  CLEOCIN   clotrimazole 1 % cream Commonly known as:  LOTRIMIN     TAKE these medications   aspirin 325 MG EC tablet Take 1 tablet (325 mg total) by mouth daily.   atorvastatin 80 MG tablet Commonly known as:  LIPITOR Take 1 tablet (80 mg total) by mouth daily at 6 PM.   clopidogrel 75 MG tablet Commonly known as:  PLAVIX Take 1 tablet (75 mg total) by mouth daily.   glipiZIDE 5 MG tablet Commonly known as:  GLUCOTROL Take 0.5 tablets (2.5 mg total) by mouth daily before breakfast.   metFORMIN 500 MG tablet Commonly known as:  GLUCOPHAGE Take 1 tablet (500 mg total) by mouth daily with breakfast. What changed:  when to take this     ASK your doctor about these medications   living well with diabetes book Misc 1 each by Does not apply route once. Ask about: Should I take this medication?       Disposition and follow-up:   JoannBetta Meyers was discharged from [facility] in stable condition.  At the hospital follow up visit please address:  1.  Stroke: Inquire about if she has had any further episodes of left facial twitching and expressive aphasia? Also please ensure she is compliant with DAPT, statin and encourage follow-up with neurology.  Type 2 diabetes: She was discharged home with Glipizide. Please start METFORMIN. DId not start on d/c due to contrast  load and fear of AKI.   2.  Labs / imaging needed at time of follow-up: None  3.  Pending labs/ test needing follow-up: Von Willebrand panel, ANCA titers, Alpha galactosidase, factor V Leiden, antiphospholipid syndrome panel.  Follow-up Appointments: Follow-up Information    [place]. Go on 03/13/2017.   Why:  Please go to your appointment May 29th at 10:15 AM for hospital follow-up appointment and to establish with primary care. You will need to follow-up on your lab results.  Contact information: [address] [address] [facility] Follow up.   Contact information: [address]     [place] [address] [place] [facility] Course by problem list: Active Problems:   Stroke [facility])   Type 2 diabetes mellitus (HCC)   Facial twitching   1. Acute, subacute cerebrovascular accident. 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 These episodes last approximately 15 seconds and were not followed by any confusion. CT head was initially negative however follow-up MRI showed acute/subacute ischemia within  an area of more chronic ischemic disease in the right frontal lobe. There also appeared to be occlusion of the right internal carotid artery. 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 MRI brain with contrast showed multifocal enhancement in the right cerebrum corresponding to multiple subacute infarcts. Full hypercoagulability workup was ordered. She was seen by neurology and  interventional radiology and no intervention was indicated. She was discharged home with DAPT, statin and instructions for close follow-up.   2.Type 2 diabetes Hemoglobin A1c 9.7% and CBGs were in the 400s on admission. She was treated with insulin during hospitalization however was discharged home with Glipizide. She will need to be started on Metformin. This was not started due to concerns of AKI in setting of high contrast loads.   Discharge Vitals:   BP 126/69 (BP Location: Left Arm)   Pulse 83   Temp 99.6 F (37.6 C) (Oral)   Resp 20   Ht 5' 2" (1.575 m)   Wt 198 lb (89.8 kg)   LMP 02/13/2017   SpO2 96%   BMI 36.21 kg/m   Pertinent Labs, Studies, and Procedures:  CT head without contrast 03/03/17: No acute intracranial abnormality. MRI brain without contrast 03/03/17: Acute/subacute ischemia within an area of more chronic ischemia involving the right frontal lobe. Asymmetric patchy white matter disease adjacent to this area on the right but is also appreciated on the left. There was occlusion of the right internal carotid artery CT angiogram head and neck 03/03/17: Confirmed occlusion of the right internal carotid artery most compatible with an old dissection. Appeared to be patent through a smaller bright middle cerebral artery. Echocardiogram: Normal ejection fraction, no apparent cardiac etiology Angiogram: Occlusion of ICA at level of ophthalmic artery Hemoglobin A1c: 9.7% Lipid profile: Total cholesterol 111. Triglycerides 217. LDL 45. HDL 23 Antinuclear antibody negative Anca proteinase 3 negative Myeloperoxidase antibodies negative Anti-Ro antibody negative Anti-La antibody negative HIV nonreactive Homocystine normal Protein C&S function normal ESR and CRP normal Anti-thrombin activity normal  Discharge Instructions: You were admitted for evaluation of acute stroke. You had extensive imaging and laboratory studies which showed occlusion of your right internal carotid  artery which appeared to be most consistent with a chronic dissection. Many laboratory tests are still pending and will require follow-up in the outpatient setting. It is important that you establish with primary care physician and appointment has been made for you at the [facility] health internal medicine center. Is also vital you follow-up with neurology as well.  SignedEinar Gip, DO 03/05/2017, 2:43 PM   Pager: [phone]

## 2017-03-05 NOTE — Procedures (Signed)
S/P  4 vessel cerebral arteriogram. Rt CFA approach. Findings. 1.Occluded RT ICA at the ophthalmic artery level.

## 2017-03-05 NOTE — Sedation Documentation (Signed)
Exoseal placed R groin- IR holding pressure

## 2017-03-05 NOTE — Discharge Instructions (Signed)
[name] were admitted after having a stroke. You will require aggressive risk factor modification and this involves taking several new medications. Please see your after visit summary for the complete list of medicines. You will also need very close follow-up with neurology and a primary care physician. I have already scheduled you an appointment in the [facility] for next week. If you are unable to make this appointment, or choose to establish with a different primary care physician, please call the clinic to reschedule.   Below you will find information on a blood sugar medicine called Glipizide that you will be started on at discharge. Please DO NOT take this medicine if you do not eat!  Glipizide tablets What is this medicine? GLIPIZIDE (GLIP i zide) helps to treat type 2 diabetes. Treatment is combined with diet and exercise. The medicine helps your body to use insulin better. This medicine may be used for other purposes; ask your health care provider or pharmacist if you have questions. COMMON BRAND NAME(S): Glucotrol What should I tell my health care provider before I take this medicine? They need to know if you have any of these conditions: -diabetic ketoacidosis -glucose-6-phosphate dehydrogenase deficiency -heart disease -kidney disease -liver disease -porphyria -severe infection or injury -thyroid disease -an unusual or allergic reaction to glipizide, sulfa drugs, other medicines, foods, dyes, or preservatives -pregnant or trying to get pregnant -breast-feeding How should I use this medicine? Take this medicine by mouth. Swallow with a drink of water. Do not take with food. Take it 30 minutes before a meal. Follow the directions on the prescription label. If you take this medicine once a day, take it 30 minutes before breakfast. Take your doses at the same time each day. Do not take more often than directed. Talk to your pediatrician regarding the use of  this medicine in children. Special care may be needed. Elderly patients over 72 years old may have a stronger reaction and need a smaller dose. Overdosage: If you think you have taken too much of this medicine contact a poison control center or emergency room at once. NOTE: This medicine is only for you. Do not share this medicine with others. What if I miss a dose? If you miss a dose, take it as soon as you can. If it is almost time for your next dose, take only that dose. Do not take double or extra doses. What may interact with this medicine? -bosentan -chloramphenicol -cisapride -clarithromycin -medicines for fungal or yeast infections -metoclopramide -probenecid -warfarin Many medications may cause an increase or decrease in blood sugar, these include: -alcohol containing beverages -aspirin and aspirin-like drugs -chloramphenicol -chromium -diuretics -female hormones, like estrogens or progestins and birth control pills -heart medicines -isoniazid -female hormones or anabolic steroids -medicines for weight loss -medicines for allergies, asthma, cold, or cough -medicines for mental problems -medicines called MAO Inhibitors like Nardil, Parnate, Marplan, Eldepryl -niacin -NSAIDs, medicines for pain and inflammation, like ibuprofen or naproxen -pentamidine -phenytoin -probenecid -quinolone antibiotics like ciprofloxacin, levofloxacin, ofloxacin -some herbal dietary supplements -steroid medicines like prednisone or cortisone -thyroid medicine This list may not describe all possible interactions. Give your health care provider a list of all the medicines, herbs, non-prescription drugs, or dietary supplements you use. Also tell them if you smoke, drink alcohol, or use illegal drugs. Some items may interact with your medicine. What should I watch for while using this medicine? Visit your doctor or health care professional for regular checks on your progress.  A test called the  HbA1C (A1C) will be monitored. This is a simple blood test. It measures your blood sugar control over the last 2 to 3 months. You will receive this test every 3 to 6 months. Learn how to check your blood sugar. Learn the symptoms of low and high blood sugar and how to manage them. Always carry a quick-source of sugar with you in case you have symptoms of low blood sugar. Examples include hard sugar candy or glucose tablets. Make sure others know that you can choke if you eat or drink when you develop serious symptoms of low blood sugar, such as seizures or unconsciousness. They must get medical help at once. Tell your doctor or health care professional if you have high blood sugar. You might need to change the dose of your medicine. If you are sick or exercising more than usual, you might need to change the dose of your medicine. Do not skip meals. Ask your doctor or health care professional if you should avoid alcohol. Many nonprescription cough and cold products contain sugar or alcohol. These can affect blood sugar. This medicine can make you more sensitive to the sun. Keep out of the sun. If you cannot avoid being in the sun, wear protective clothing and use sunscreen. Do not use sun lamps or tanning beds/booths. Wear a medical ID bracelet or chain, and carry a card that describes your disease and details of your medicine and dosage times. What side effects may I notice from receiving this medicine? Side effects that you should report to your doctor or health care professional as soon as possible: -allergic reactions like skin rash, itching or hives, swelling of the face, lips, or tongue -breathing problems -dark urine -fever, chills, sore throat -signs and symptoms of low blood sugar such as feeling anxious, confusion, dizziness, increased hunger, unusually weak or tired, sweating, shakiness, cold, irritable, headache, blurred vision, fast heartbeat, loss of consciousness -unusual bleeding or  bruising -yellowing of the eyes or skin Side effects that usually do not require medical attention (report to your doctor or health care professional if they continue or are bothersome): -diarrhea -dizziness -headache -heartburn -nausea -stomach gas This list may not describe all possible side effects. Call your doctor for medical advice about side effects. You may report side effects to FDA at 1-800-FDA-1088. Where should I keep my medicine? Keep out of the reach of children. Store at room temperature below 30 degrees C (86 degrees F). Throw away any unused medicine after the expiration date. NOTE: This sheet is a summary. It may not cover all possible information. If you have questions about this medicine, talk to your doctor, pharmacist, or health care provider.  2018 Elsevier/Gold Standard (2013-01-15 14:42:46)

## 2017-03-05 NOTE — Sedation Documentation (Signed)
IR tech placing exoseal to R groin 
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
## 2017-03-05 NOTE — Care Management Note (Signed)
Case Management Note  Patient Details  Name: Jazyah Butsch MRN: [id number] Date of Birth: 01-15-1986  Subjective/Objective:                    Action/Plan: Pt discharging home with self care. Pt without insurance and no PCP. Pt is going to be f/u in the [facility].  Pt given [place] letter to assist with cost of d/c medications. CM also informed her of seeing which pharmacy will provide the best price for her medications. We also discussed trying to have MD use $4 list at [facility].   Expected Discharge Date:  03/05/17               Expected Discharge Plan:  Home/Self Care  In-House Referral:     Discharge planning Services  CM Consult, [facility]  Post Acute Care Choice:    Choice offered to:  Patient  DME Arranged:    DME Agency:     HH Arranged:    [place] Agency:     Status of Service:  Completed, signed off  If discussed at [employer], dates discussed:    Additional Comments:  Pollie Friar, RN 03/05/2017, 7:04 PM

## 2017-03-05 NOTE — Telephone Encounter (Signed)
Hospital TOC per Dr Danford Bad, discharge 03/05/2017 appt 03/13/2017.

## 2017-03-05 NOTE — Sedation Documentation (Signed)
Extra Fentanyl given for pain at groin site from pressure hold per Dr Estanislado Pandy

## 2017-03-05 NOTE — Sedation Documentation (Signed)
IR team continues pressure hold to R groin

## 2017-03-05 NOTE — Progress Notes (Signed)
Patient ready for discharge to home; note from MD given to patient regarding return to work; discharge instructions given and reviewed; Rx's sent electronically by MD; patient groin unremarkable without hematoma; no drainage; clean and dry; patient dressed for discharge; accompanied home by her mother; education reviewed several times; patient verbalizes understanding. Patient discharged out via wheelchair.

## 2017-03-06 LAB — ANTIPHOSPHOLIPID SYNDROME EVAL, BLD
Anticardiolipin IgG: <9 GPL U/mL (ref 0–14)
Anticardiolipin IgM: <9 MPL U/mL (ref 0–12)
DRVVT: 34.1 s (ref 0.0–47.0)
PHOSPHATYDALSERINE, IGA: 1 {APS'U} (ref 0–20)
PHOSPHATYDALSERINE, IGG: 3 {GPS'U} (ref 0–11)
PHOSPHATYDALSERINE, IGM: 4 {MPS'U} (ref 0–25)
PTT Lupus Anticoagulant: 26.8 s (ref 0.0–51.9)

## 2017-03-06 LAB — ANCA TITERS
Atypical P-ANCA titer: <1:20 {titer}
P-ANCA: <1:20 {titer}

## 2017-03-07 ENCOUNTER — Encounter (HOSPITAL_COMMUNITY): Payer: Self-pay | Admitting: Interventional Radiology

## 2017-03-08 LAB — FACTOR 5 LEIDEN

## 2017-03-09 LAB — ALPHA GALACTOSIDASE: ALPHA GALACTOSIDASE, SERUM: 38.1 nmol/h/mg{prot} (ref 28.0–80.0)

## 2017-03-13 ENCOUNTER — Ambulatory Visit (INDEPENDENT_AMBULATORY_CARE_PROVIDER_SITE_OTHER): Payer: Self-pay | Admitting: Internal Medicine

## 2017-03-13 VITALS — BP 122/84 | HR 93 | Temp 98.3°F | Ht 62.0 in | Wt 203.7 lb

## 2017-03-13 DIAGNOSIS — S7011XA Contusion of right thigh, initial encounter: Secondary | ICD-10-CM

## 2017-03-13 DIAGNOSIS — D509 Iron deficiency anemia, unspecified: Secondary | ICD-10-CM

## 2017-03-13 DIAGNOSIS — Z7984 Long term (current) use of oral hypoglycemic drugs: Secondary | ICD-10-CM

## 2017-03-13 DIAGNOSIS — Z833 Family history of diabetes mellitus: Secondary | ICD-10-CM

## 2017-03-13 DIAGNOSIS — S8011XA Contusion of right lower leg, initial encounter: Secondary | ICD-10-CM | POA: Insufficient documentation

## 2017-03-13 DIAGNOSIS — Z825 Family history of asthma and other chronic lower respiratory diseases: Secondary | ICD-10-CM

## 2017-03-13 DIAGNOSIS — O99019 Anemia complicating pregnancy, unspecified trimester: Secondary | ICD-10-CM | POA: Insufficient documentation

## 2017-03-13 DIAGNOSIS — Y838 Other surgical procedures as the cause of abnormal reaction of the patient, or of later complication, without mention of misadventure at the time of the procedure: Secondary | ICD-10-CM

## 2017-03-13 DIAGNOSIS — E119 Type 2 diabetes mellitus without complications: Secondary | ICD-10-CM

## 2017-03-13 DIAGNOSIS — Z8673 Personal history of transient ischemic attack (TIA), and cerebral infarction without residual deficits: Secondary | ICD-10-CM

## 2017-03-13 DIAGNOSIS — Z8249 Family history of ischemic heart disease and other diseases of the circulatory system: Secondary | ICD-10-CM

## 2017-03-13 LAB — CBC
HCT: 34.7 % — ABNORMAL LOW (ref 36.0–46.0)
Hemoglobin: 11.4 g/dL — ABNORMAL LOW (ref 12.0–15.0)
MCH: 26.2 pg (ref 26.0–34.0)
MCHC: 32.9 g/dL (ref 30.0–36.0)
MCV: 79.8 fL (ref 78.0–100.0)
PLATELETS: 222 10*3/uL (ref 150–400)
RBC: 4.35 MIL/uL (ref 3.87–5.11)
RDW: 15.3 % (ref 11.5–15.5)
WBC: 7.6 10*3/uL (ref 4.0–10.5)

## 2017-03-13 LAB — APTT: aPTT: 25 seconds (ref 24–36)

## 2017-03-13 LAB — PROTIME-INR
INR: 0.94
PROTHROMBIN TIME: 12.6 s (ref 11.4–15.2)

## 2017-03-13 MED ORDER — ATORVASTATIN CALCIUM 80 MG PO TABS: 40.0000 mg | ORAL_TABLET | Freq: Every day | ORAL | Status: DC

## 2017-03-13 MED ORDER — CLOPIDOGREL BISULFATE 75 MG PO TABS: 75.0000 mg | ORAL_TABLET | Freq: Every day | ORAL | 0 refills | Status: DC

## 2017-03-13 MED ORDER — METFORMIN HCL 1000 MG PO TABS: 1000.0000 mg | ORAL_TABLET | Freq: Every day | ORAL | 1 refills | Status: DC

## 2017-03-13 MED ORDER — ASPIRIN 325 MG PO TBEC: 325.0000 mg | DELAYED_RELEASE_TABLET | Freq: Every day | ORAL | 0 refills | Status: DC

## 2017-03-13 NOTE — Assessment & Plan Note (Addendum)
ADDENDUM 03/13/2017  12:00 PM:  PT/PTT/INR reassuring for no coagulopathy. CBC with stable microcytic anemia: Hb 11.4, MCV 79.8. She may benefit from iron therapy so will add on a ferritin.   ADDENDUM 03/14/2017  8:44 AM:  Ferritin 73 which is inconsistent with iron deficiency anemia. Recommend smear and possibly Hb electrophoresis at follow-up to account for her microcytic anemia.

## 2017-03-13 NOTE — Patient Instructions (Addendum)
Welcome to our clinic.  For the diabetes, take TWO tablets of metformin until you run out as well as 1/2 tablet of glipizide.  For the cholesterol, take 1/2 tablet of atorvastatin instead of 1.  For the stroke, please be sure to pick up the aspirin and clopidogrel.  Let's see each other back in 1 month.

## 2017-03-13 NOTE — Assessment & Plan Note (Addendum)
Assessment At her last hospitalization, she underwent angiography during which the right femoral artery was accessed. Since discharge, she developed right anterior thigh bruising associated with numbness which has been worsening. Ice and rest and mildly improved it but does not limit her activity.  While her physical exam is reassuring for no compromised circulation, we need to rule out an aneurysm or pseudoaneurysm with US imaging.  Plan -Order STAT US imaging. Spoke with Dr. Kathlene Cote who recommended LE arterial dopplers but to specify rule out for pseudoaneurysm. -Check CBC, PT, PTT, INR  ADDENDUM 03/13/2017  12:00 PM:  PT/PTT/INR reassuring for no coagulopathy. CBC with stable microcytic anemia: Hb 11.4, MCV 79.8.

## 2017-03-13 NOTE — Progress Notes (Signed)
Internal Medicine Clinic Attending  I saw and evaluated the patient.  I personally confirmed the key portions of the history and exam documented by Dr. Patel,Rushil and I reviewed pertinent patient test results.  The assessment, diagnosis, and plan were formulated together and I agree with the documentation in the resident's note. 

## 2017-03-13 NOTE — Assessment & Plan Note (Signed)
Assessment At her last hospitalization, she was diagnosed with Type 2 diabetes with A1c 9.7, 03/04/17. She takes metformin 500 mg and glipizide 2.5 mg with breakfast and has not had any diarrhea, nausea or other symptoms suggestive of medication intolerance.   Given overall functional status and other co-morbidities, glycemic target of A1c <7 is appropriate.   Plan -Increase metformin to 1000 mg daily. Recommended she double her current dose until she runs out at which point she can pick up the new prescription -Continue glipizide 2.5 mg with breakfast

## 2017-03-13 NOTE — Assessment & Plan Note (Signed)
Assessment At her last hospitalization, she was found to have subacute infarcts of the right cerebrum though to be thromboembolic from a chronic occlusion of the right internal carotid artery   Since discharge, she has had one episode of facial twitching we she attributes to her not taking medications that day. She never picked up her aspirin and clopidogrel though experiences nausea when she takes atorvastatin 80 mg which is double the high-intensity statin dosing.  Review of her labwork for various coagulopathies resulted negative, including von Willebrand disease, ANCA titers, alpha galactosidase, factor V Leiden, and antiphospholipid syndrome.  Plan -Try taking atorvastatin 40 mg [1/2 tablet] to see if she experiences less symptoms.  -Resent prescriptions for aspirin 325 mg daily and clopidogrel 75 mg daily. I emphasized to her the importance of taking these medications to reduce her risk of recurrent strokes.

## 2017-03-13 NOTE — Progress Notes (Signed)
   CC: right leg brusing  HPI:  [name] is a 31 y.o. who presents today for establishing care with our clinic. Please see assessment & plan for status of chronic medical problems.  History reviewed. No pertinent past medical history.   Past Surgical History:  Procedure Laterality Date  . IR ANGIO INTRA EXTRACRAN SEL COM CAROTID INNOMINATE BILAT MOD SED  03/05/2017  . IR ANGIO VERTEBRAL SEL VERTEBRAL BILAT MOD SED  03/05/2017  . IR ANGIOGRAM EXTREMITY LEFT  03/05/2017   Family History  Problem Relation Age of Onset  . Deep vein thrombosis Mother        Late 22s, unprovoked. Treated with warfarin indefinitely  . Diabetes Father   . Asthma Father   . COPD Father    Social History   Social History  . Marital status: Single    Spouse name: N/A  . Number of children: N/A  . Years of education: N/A   Occupational History  . Not on file.   Social History Main Topics  . Smoking status: Never Smoker  . Smokeless tobacco: Never Used  . Alcohol use No  . Drug use: No  . Sexual activity: No   Other Topics Concern  . Not on file   Social History Narrative   Lived in the [place] since 1999, originally born in [place]. Enjoys spending time with family.     Review of Systems:  Please see each problem below for a pertinent review of systems.   Physical Exam:  Vitals:   03/13/17 1021  BP: 122/84  Pulse: 93  Temp: 98.3 F (36.8 C)  TempSrc: Oral  SpO2: 100%  Weight: 203 lb 11.2 oz (92.4 kg)  Height: 5\' 2"  (1.575 m)   Physical Exam  Constitutional: No distress.  HENT:  Head: Normocephalic and atraumatic.  Eyes: Conjunctivae are normal. No scleral icterus.  Cardiovascular:  2+ right dorsalis pedis  Pulmonary/Chest: Effort normal. No respiratory distress.  Skin: She is not diaphoretic.  Right anterior thigh with significant bruising that spans the medial aspect just inferior to the groin.   Assessment & Plan:   See Encounters Tab for problem based  charting.  Patient seen with Dr. Evette Doffing

## 2017-03-14 LAB — FERRITIN: Ferritin: 73 ng/mL (ref 15–150)

## 2017-03-14 NOTE — Telephone Encounter (Signed)
Pt seen 03/13/2017 in [facility].Joann Hidden Cassady5/30/201810:48 AM

## 2017-03-16 ENCOUNTER — Ambulatory Visit (HOSPITAL_COMMUNITY)
Admission: RE | Admit: 2017-03-16 | Discharge: 2017-03-16 | Disposition: A | Discharge status: Home / Self Care | Payer: Self-pay | Source: Ambulatory Visit | Attending: Vascular Surgery | Admitting: Vascular Surgery

## 2017-03-16 DIAGNOSIS — S8011XA Contusion of right lower leg, initial encounter: Secondary | ICD-10-CM

## 2017-03-16 DIAGNOSIS — Y849 Medical procedure, unspecified as the cause of abnormal reaction of the patient, or of later complication, without mention of misadventure at the time of the procedure: Secondary | ICD-10-CM | POA: Insufficient documentation

## 2017-03-16 DIAGNOSIS — L7632 Postprocedural hematoma of skin and subcutaneous tissue following other procedure: Secondary | ICD-10-CM | POA: Insufficient documentation

## 2017-03-21 ENCOUNTER — Ambulatory Visit: Payer: Self-pay

## 2017-03-22 ENCOUNTER — Encounter: Payer: Self-pay | Admitting: Internal Medicine

## 2017-03-22 ENCOUNTER — Emergency Department (HOSPITAL_COMMUNITY)
Admission: EM | Admit: 2017-03-22 | Discharge: 2017-03-22 | Disposition: A | Discharge status: Home / Self Care | Payer: Self-pay | Attending: Emergency Medicine | Admitting: Emergency Medicine

## 2017-03-22 ENCOUNTER — Emergency Department (HOSPITAL_COMMUNITY): Payer: Self-pay

## 2017-03-22 ENCOUNTER — Encounter (HOSPITAL_COMMUNITY): Payer: Self-pay | Admitting: Vascular Surgery

## 2017-03-22 DIAGNOSIS — Z8679 Personal history of other diseases of the circulatory system: Secondary | ICD-10-CM | POA: Insufficient documentation

## 2017-03-22 DIAGNOSIS — R519 Headache, unspecified: Secondary | ICD-10-CM

## 2017-03-22 DIAGNOSIS — R208 Other disturbances of skin sensation: Secondary | ICD-10-CM

## 2017-03-22 DIAGNOSIS — Z8673 Personal history of transient ischemic attack (TIA), and cerebral infarction without residual deficits: Secondary | ICD-10-CM | POA: Insufficient documentation

## 2017-03-22 DIAGNOSIS — I6521 Occlusion and stenosis of right carotid artery: Secondary | ICD-10-CM

## 2017-03-22 DIAGNOSIS — Z7984 Long term (current) use of oral hypoglycemic drugs: Secondary | ICD-10-CM | POA: Insufficient documentation

## 2017-03-22 DIAGNOSIS — G44209 Tension-type headache, unspecified, not intractable: Secondary | ICD-10-CM

## 2017-03-22 DIAGNOSIS — R51 Headache: Secondary | ICD-10-CM | POA: Insufficient documentation

## 2017-03-22 DIAGNOSIS — E119 Type 2 diabetes mellitus without complications: Secondary | ICD-10-CM | POA: Insufficient documentation

## 2017-03-22 HISTORY — DX: Type 2 diabetes mellitus without complications: E11.9

## 2017-03-22 LAB — DIFFERENTIAL
BASOS ABS: 0 10*3/uL (ref 0.0–0.1)
BASOS PCT: 0 %
EOS ABS: 0.2 10*3/uL (ref 0.0–0.7)
Eosinophils Relative: 4 %
LYMPHS ABS: 1 10*3/uL (ref 0.7–4.0)
Lymphocytes Relative: 20 %
Monocytes Absolute: 0.3 10*3/uL (ref 0.1–1.0)
Monocytes Relative: 7 %
NEUTROS PCT: 69 %
Neutro Abs: 3.3 10*3/uL (ref 1.7–7.7)

## 2017-03-22 LAB — CBC
HCT: 30.5 % — ABNORMAL LOW (ref 36.0–46.0)
HEMOGLOBIN: 10 g/dL — AB (ref 12.0–15.0)
MCH: 26 pg (ref 26.0–34.0)
MCHC: 32.8 g/dL (ref 30.0–36.0)
MCV: 79.4 fL (ref 78.0–100.0)
PLATELETS: 152 10*3/uL (ref 150–400)
RBC: 3.84 MIL/uL — ABNORMAL LOW (ref 3.87–5.11)
RDW: 15.9 % — AB (ref 11.5–15.5)
WBC: 4.8 10*3/uL (ref 4.0–10.5)

## 2017-03-22 LAB — COMPREHENSIVE METABOLIC PANEL
ALBUMIN: 3.9 g/dL (ref 3.5–5.0)
ALT: 18 U/L (ref 14–54)
AST: 22 U/L (ref 15–41)
Alkaline Phosphatase: 87 U/L (ref 38–126)
Anion gap: 10 (ref 5–15)
BUN: 7 mg/dL (ref 6–20)
CHLORIDE: 104 mmol/L (ref 101–111)
CO2: 22 mmol/L (ref 22–32)
Calcium: 9.1 mg/dL (ref 8.9–10.3)
Creatinine, Ser: 0.41 mg/dL — ABNORMAL LOW (ref 0.44–1.00)
GFR calc Af Amer: >60 mL/min (ref >60)
GFR calc non Af Amer: >60 mL/min (ref >60)
GLUCOSE: 367 mg/dL — AB (ref 65–99)
Potassium: 3.9 mmol/L (ref 3.5–5.1)
SODIUM: 136 mmol/L (ref 135–145)
Total Bilirubin: 1.1 mg/dL (ref 0.3–1.2)
Total Protein: 6.3 g/dL — ABNORMAL LOW (ref 6.5–8.1)

## 2017-03-22 LAB — I-STAT CHEM 8, ED
BUN: 7 mg/dL (ref 6–20)
CREATININE: 0.3 mg/dL — AB (ref 0.44–1.00)
Calcium, Ion: 1.21 mmol/L (ref 1.15–1.40)
Chloride: 102 mmol/L (ref 101–111)
Glucose, Bld: 385 mg/dL — ABNORMAL HIGH (ref 65–99)
HEMATOCRIT: 29 % — AB (ref 36.0–46.0)
HEMOGLOBIN: 9.9 g/dL — AB (ref 12.0–15.0)
POTASSIUM: 3.9 mmol/L (ref 3.5–5.1)
Sodium: 137 mmol/L (ref 135–145)
TCO2: 22 mmol/L (ref 0–100)

## 2017-03-22 LAB — APTT: APTT: 27 s (ref 24–36)

## 2017-03-22 LAB — PROTIME-INR
INR: 1.02
Prothrombin Time: 13.4 seconds (ref 11.4–15.2)

## 2017-03-22 LAB — I-STAT TROPONIN, ED: Troponin i, poc: 0 ng/mL (ref 0.00–0.08)

## 2017-03-22 LAB — ETHANOL: Alcohol, Ethyl (B): <5 mg/dL (ref <5)

## 2017-03-22 MED ORDER — HYDROCODONE-ACETAMINOPHEN 5-325 MG PO TABS: 1.0000 | ORAL_TABLET | ORAL | 0 refills | Status: DC | PRN

## 2017-03-22 MED ORDER — IOPAMIDOL (ISOVUE-370) INJECTION 76%
INTRAVENOUS | Status: AC
  Administered 2017-03-22: 50 mL
  Filled 2017-03-22: qty 50

## 2017-03-22 MED ORDER — ONDANSETRON HCL 4 MG/2ML IJ SOLN
4.0000 mg | Freq: Once | INTRAMUSCULAR | Status: AC
  Administered 2017-03-22: 4 mg via INTRAVENOUS
  Filled 2017-03-22: qty 2

## 2017-03-22 MED ORDER — MORPHINE SULFATE (PF) 4 MG/ML IV SOLN
4.0000 mg | Freq: Once | INTRAVENOUS | Status: AC
  Administered 2017-03-22: 4 mg via INTRAVENOUS
  Filled 2017-03-22: qty 1

## 2017-03-22 MED ORDER — SODIUM CHLORIDE 0.9 % IV SOLN
Freq: Once | INTRAVENOUS | Status: AC
  Administered 2017-03-22: 16:00:00 via INTRAVENOUS

## 2017-03-22 MED ORDER — ONDANSETRON 4 MG PO TBDP: 4.0000 mg | ORAL_TABLET | ORAL | 0 refills | Status: DC | PRN

## 2017-03-22 NOTE — ED Notes (Signed)
Discussed pt with Dr. Johnney Killian who states at this time to perform stroke order set but not to call a code stroke.

## 2017-03-22 NOTE — ED Triage Notes (Signed)
Pt reports to the ED for eval left sided facial numbness, dizziness, and HA. Onset was sudden and occurred approx 1 hour ago (estimates about 10:50). She denies any vision changes, unilateral weakness, aphasia, ataxia, or dysphagia. She was recent admitted for a stroke. She is on blood thinners but cannot remember the name. She has been compliant with all of her medications.

## 2017-03-22 NOTE — ED Provider Notes (Signed)
[place] DEPT Provider Note   CSN: [id number] Arrival date & time: 03/22/17  1146     History   Chief Complaint Chief Complaint  Patient presents with  . Numbness    HPI Joann Meyers is a 31 y.o. female.  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 She was discharged on aspirin and Plavix. She does have history of diabetes. Patient reports that she felt well for about 2 weeks after her discharge. She reports yesterday she developed some headache that was not too severe yesterday evening. She reports she took her medications and thought she was doing fine. She went to sleep. She got up this morning and denies having awakened with a headache. She reports about 1040 she got a severe headache that was on the left side of her head in the back and top. She reports it made the left side of her face towards her back of her neck feel kind of numb. Patient for she felt somewhat dizzy. She reports she felt there might of been some blurring in the left side. No double vision or loss of vision. No focal weakness numbness or tingling. No slurred speech. Past Medical History:  Diagnosis Date  . Diabetes mellitus without complication [facility])     Patient Active Problem List   Diagnosis Date Noted  . Leg hematoma, right, initial encounter 03/13/2017  . Microcytic anemia 03/13/2017  . History of stroke 03/03/2017  . Type 2 diabetes mellitus ([place]) 03/03/2017    Past Surgical History:  Procedure Laterality Date  . IR ANGIO INTRA EXTRACRAN SEL COM CAROTID INNOMINATE BILAT MOD SED  03/05/2017  . IR ANGIO VERTEBRAL SEL VERTEBRAL BILAT MOD SED  03/05/2017  . IR ANGIOGRAM EXTREMITY LEFT  03/05/2017    OB History    Gravida Para Term Preterm AB Living   0 0 0 0 0 0   SAB TAB Ectopic Multiple Live Births   0 0 0 0 0       Home Medications    Prior to Admission medications   Medication Sig Start Date End Date  Taking? Authorizing Provider  atorvastatin (LIPITOR) 80 MG tablet Take 0.5 tablets (40 mg total) by mouth daily at 6 PM. Patient taking differently: Take 80 mg by mouth daily at 6 PM.  03/13/17  Yes Riccardo Dubin, MD  clopidogrel (PLAVIX) 75 MG tablet Take 1 tablet (75 mg total) by mouth daily. 03/13/17  Yes Riccardo Dubin, MD  glipiZIDE (GLUCOTROL) 5 MG tablet Take 0.5 tablets (2.5 mg total) by mouth daily before breakfast. 03/05/17 03/05/18 Yes Molt, [place], DO  HYDROcodone-acetaminophen (NORCO/VICODIN) 5-325 MG tablet Take 1 tablet by mouth every 6 (six) hours as needed for moderate pain.   Yes [provider]  metFORMIN (GLUCOPHAGE) 1000 MG tablet Take 1 tablet (1,000 mg total) by mouth daily with breakfast. 03/13/17 03/13/18 Yes Riccardo Dubin, MD  aspirin 325 MG EC tablet Take 1 tablet (325 mg total) by mouth daily. Patient not taking: Reported on 03/22/2017 03/13/17   Riccardo Dubin, MD  HYDROcodone-acetaminophen (NORCO/VICODIN) 5-325 MG tablet Take 1-2 tablets by mouth every 4 (four) hours as needed for moderate pain or severe pain. 03/22/17   Charlesetta Shanks, MD  ondansetron (ZOFRAN ODT) 4 MG disintegrating tablet Take 1 tablet (4 mg total) by mouth every 4 (four) hours as needed for nausea or vomiting. 03/22/17   Charlesetta Shanks, MD    Family History Family  History  Problem Relation Age of Onset  . Deep vein thrombosis Mother        Late 76s, unprovoked. Treated with warfarin indefinitely  . Diabetes Father   . Asthma Father   . COPD Father     Social History Social History  Substance Use Topics  . Smoking status: Never Smoker  . Smokeless tobacco: Never Used  . Alcohol use No     Allergies   Shellfish allergy   Review of Systems Review of Systems 10 Systems reviewed and are negative for acute change except as noted in the HPI.   Physical Exam Updated Vital Signs BP 123/84   Pulse 83   Temp 97.6 F (36.4 C) (Oral)   Resp (!) 25   LMP 03/10/2017   SpO2  100%   Physical Exam  Constitutional: She is oriented to person, place, and time.  Patient does appear to be in pain and uncomfortable. He is alert and nontoxic. No respiratory distress. Color is good.  HENT:  Right Ear: External ear normal.  Left Ear: External ear normal.  Nose: Nose normal.  Mouth/Throat: Oropharynx is clear and moist.  Patient does endorse reproducible pain to palpation along the left side of her brow in [place].  Eyes: EOM are normal. Pupils are equal, round, and reactive to light.  Neck: Neck supple.  Cardiovascular: Normal rate, regular rhythm, normal heart sounds and intact distal pulses.   Pulmonary/Chest: Effort normal and breath sounds normal.  Abdominal: Soft. She exhibits no distension. There is no tenderness. There is no guarding.  Musculoskeletal: Normal range of motion. She exhibits no edema, tenderness or deformity.  Neurological: She is alert and oriented to person, place, and time. No cranial nerve deficit or sensory deficit. She exhibits normal muscle tone. Coordination normal.  Skin: Skin is warm and dry.  Psychiatric:  Patient presents uncomfortable and in pain. Affect is situationally appropriate.     ED Treatments / Results  Labs (all labs ordered are listed, but only abnormal results are displayed) Labs Reviewed  CBC - Abnormal; Notable for the following:       Result Value   RBC 3.84 (*)    Hemoglobin 10.0 (*)    HCT 30.5 (*)    RDW 15.9 (*)    All other components within normal limits  COMPREHENSIVE METABOLIC PANEL - Abnormal; Notable for the following:    Glucose, Bld 367 (*)    Creatinine, Ser 0.41 (*)    Total Protein 6.3 (*)    All other components within normal limits  I-STAT CHEM 8, ED - Abnormal; Notable for the following:    Creatinine, Ser 0.30 (*)    Glucose, Bld 385 (*)    Hemoglobin 9.9 (*)    HCT 29.0 (*)    All other components within normal limits  ETHANOL  PROTIME-INR  APTT  DIFFERENTIAL  RAPID URINE DRUG  SCREEN, HOSP PERFORMED  URINALYSIS, ROUTINE W REFLEX MICROSCOPIC  I-STAT TROPOININ, ED    EKG  EKG Interpretation None       Radiology Ct Angio Head W/cm &/or Wo Cm  Result Date: 03/22/2017 CLINICAL DATA:  31 year old female with severe left posterior headache, left eye blurred vision, and dizziness beginning today. Abnormal right ICA, found to be occluded just distal to the ophthalmic artery origin last month. Subacute and chronic right hemisphere ischemia at that time. EXAM: CT ANGIOGRAPHY HEAD AND NECK TECHNIQUE: Multidetector CT imaging of the head and neck was performed using the standard protocol  during bolus administration of intravenous contrast. Multiplanar CT image reconstructions and MIPs were obtained to evaluate the vascular anatomy. Carotid stenosis measurements (when applicable) are obtained utilizing NASCET criteria, using the distal internal carotid diameter as the denominator. CONTRAST:  50 mL Isovue 370 COMPARISON:  Cerebral angiogram 03/05/2017. CTA head and neck 03/03/2017.  Brain MRI 03/03/2017 FINDINGS: CTA NECK Skeleton: No acute osseous abnormality identified. Carious right mandible wisdom tooth. Upper chest: Negative visualized superior mediastinum. Negative visible lung parenchyma. Other neck: Subcentimeter right upper pole thyroid nodule is hypodense and does not meet size criteria for ultrasound follow-up. Otherwise negative neck soft tissues. Bilateral cervical lymph nodes are stable and within normal limits for age. Aortic arch: Mildly bovine type arch configuration but also the left vertebral artery arises directly from the arch, normal anatomic variants. No arch atherosclerosis or great vessel origin stenosis. Right carotid system: No right CCA stenosis. The right CCA is smaller than the left. The right carotid bifurcation is patent. There is rapid tapering of the proximal right ICA to a diminutive vessel which does remain patent to the skullbase. The appearance is  unchanged from 03/03/2017. Left carotid system: Negative.  No atherosclerosis or stenosis. Vertebral arteries: No proximal right subclavian artery stenosis. The right vertebral artery has a proximal origin which is normal. The right vertebral artery is normal to the skullbase. The left vertebral artery arises directly from the arch with no origin stenosis. The vertebral arteries are fairly codominant. The left vertebral artery remains normal to the skullbase. CTA HEAD Posterior circulation: Codominant distal vertebral arteries are normal to the vertebrobasilar junction. Normal left PICA origin. Dominant appearing right AICA origin is patent. No basilar stenosis. SCA and PCA origins are normal. Normal left posterior communicating artery Re demonstrated. The right Pcomm is diminutive or absent. Bilateral PCA branches are normal. Anterior circulation: The left ICA siphon is patent and normal. Normal left ICA terminus, left MCA and ACA origins. Thread-like enhancement of the right ICA siphon which appears to occlude in the region of the right anterior genu. Stable appearance from the prior CTA. Reconstituted enhancement at the right ICA terminus appears stable compared to 03/03/2017. A diminutive right A1 segment is again noted. The anterior communicating artery appears normal. Bilateral ACA A2 and distal enhancement remains normal. The right MCA M1 segment is stable. Right MCA bifurcation remains patent. Right MCA branches appear stable since 03/03/2017. Venous sinuses: Patent. Anatomic variants: Mild anatomic variation of the aortic arch. Delayed phase: No intracranial mass effect. No ventriculomegaly. No evidence of acute intracranial hemorrhage. encephalomalacia along the anterior right operculum is subtle by CT. Stable gray-white matter differentiation throughout the brain. No abnormal enhancement identified. Review of the MIP images confirms the above findings IMPRESSION: 1. Unchanged CTA findings since  03/03/2017. 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 2. No atherosclerosis identified. 3.  Stable CT appearance of the brain. 4. No acute findings identified in the neck or upper chest. Electronically Signed   By: Genevie Ann M.D.   On: 03/22/2017 15:28   Ct Head Wo Contrast  Result Date: 03/22/2017 CLINICAL DATA:  Left facial numbness, dizziness and headache beginning last night. EXAM: CT HEAD WITHOUT CONTRAST TECHNIQUE: Contiguous axial images were obtained from the base of the skull through the vertex without intravenous contrast. COMPARISON:  Brain MRI and head CT scan 03/03/2017. CT angiogram 03/03/2017. FINDINGS: Brain: No evidence of acute intracranial abnormality including hemorrhage, infarct, mass lesion, mass effect, midline  shift or abnormal extra-axial fluid collection. No hydrocephalus or pneumocephalus. There is scattered white matter disease. Vascular: Negative. Skull: Intact. Sinuses/Orbits: Negative. Other: None. IMPRESSION: No acute abnormality. Area of acute on chronic ischemia in the right frontal lobe seen on prior MRI is not well demonstrated on this study. Electronically Signed   By: Inge Rise M.D.   On: 03/22/2017 12:55   Ct Angio Neck W Or Wo Contrast  Result Date: 03/22/2017 CLINICAL DATA:  32 year old female with severe left posterior headache, left eye blurred vision, and dizziness beginning today. Abnormal right ICA, found to be occluded just distal to the ophthalmic artery origin last month. Subacute and chronic right hemisphere ischemia at that time. EXAM: CT ANGIOGRAPHY HEAD AND NECK TECHNIQUE: Multidetector CT imaging of the head and neck was performed using the standard protocol during bolus administration of intravenous contrast. Multiplanar CT image reconstructions and MIPs were obtained to evaluate the vascular anatomy. Carotid stenosis measurements (when applicable) are obtained utilizing NASCET criteria, using the  distal internal carotid diameter as the denominator. CONTRAST:  50 mL Isovue 370 COMPARISON:  Cerebral angiogram 03/05/2017. CTA head and neck 03/03/2017.  Brain MRI 03/03/2017 FINDINGS: CTA NECK Skeleton: No acute osseous abnormality identified. Carious right mandible wisdom tooth. Upper chest: Negative visualized superior mediastinum. Negative visible lung parenchyma. Other neck: Subcentimeter right upper pole thyroid nodule is hypodense and does not meet size criteria for ultrasound follow-up. Otherwise negative neck soft tissues. Bilateral cervical lymph nodes are stable and within normal limits for age. Aortic arch: Mildly bovine type arch configuration but also the left vertebral artery arises directly from the arch, normal anatomic variants. No arch atherosclerosis or great vessel origin stenosis. Right carotid system: No right CCA stenosis. The right CCA is smaller than the left. The right carotid bifurcation is patent. There is rapid tapering of the proximal right ICA to a diminutive vessel which does remain patent to the skullbase. The appearance is unchanged from 03/03/2017. Left carotid system: Negative.  No atherosclerosis or stenosis. Vertebral arteries: No proximal right subclavian artery stenosis. The right vertebral artery has a proximal origin which is normal. The right vertebral artery is normal to the skullbase. The left vertebral artery arises directly from the arch with no origin stenosis. The vertebral arteries are fairly codominant. The left vertebral artery remains normal to the skullbase. CTA HEAD Posterior circulation: Codominant distal vertebral arteries are normal to the vertebrobasilar junction. Normal left PICA origin. Dominant appearing right AICA origin is patent. No basilar stenosis. SCA and PCA origins are normal. Normal left posterior communicating artery Re demonstrated. The right Pcomm is diminutive or absent. Bilateral PCA branches are normal. Anterior circulation: The left ICA  siphon is patent and normal. Normal left ICA terminus, left MCA and ACA origins. Thread-like enhancement of the right ICA siphon which appears to occlude in the region of the right anterior genu. Stable appearance from the prior CTA. Reconstituted enhancement at the right ICA terminus appears stable compared to 03/03/2017. A diminutive right A1 segment is again noted. The anterior communicating artery appears normal. Bilateral ACA A2 and distal enhancement remains normal. The right MCA M1 segment is stable. Right MCA bifurcation remains patent. Right MCA branches appear stable since 03/03/2017. Venous sinuses: Patent. Anatomic variants: Mild anatomic variation of the aortic arch. Delayed phase: No intracranial mass effect. No ventriculomegaly. No evidence of acute intracranial hemorrhage. encephalomalacia along the anterior right operculum is subtle by CT. Stable gray-white matter differentiation throughout the brain. No abnormal enhancement identified. Review  of the MIP images confirms the above findings IMPRESSION: 1. Unchanged CTA findings since 03/03/2017. 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 2. No atherosclerosis identified. 3.  Stable CT appearance of the brain. 4. No acute findings identified in the neck or upper chest. Electronically Signed   By: Genevie Ann M.D.   On: 03/22/2017 15:28    Procedures Procedures (including critical care time)  Medications Ordered in ED Medications  morphine 4 MG/ML injection 4 mg (4 mg Intravenous Given 03/22/17 1559)  ondansetron (ZOFRAN) injection 4 mg (4 mg Intravenous Given 03/22/17 1559)  0.9 %  sodium chloride infusion ( Intravenous New Bag/Given 03/22/17 1558)  iopamidol (ISOVUE-370) 76 % injection (50 mLs  Contrast Given 03/22/17 1447)     Initial Impression / Assessment and Plan / ED Course  I have reviewed the triage vital signs and the nursing notes.  Pertinent labs & imaging results that were available  during my care of the patient were reviewed by me and considered in my medical decision making (see chart for details).    Consult: (13:12) Dr. Shon Hale neurology. We will proceed with repeat CTA to determine if there has been another dissection. Treat for pain. Patient has been evaluated in the emergency department by neurology. At this time no positive findings are identified on CT angiogram or physical exam. Recommendation is for a management for headache. Final Clinical Impressions(s) / ED Diagnoses   Final diagnoses:  History of carotid artery dissection  Acute nonintractable headache, unspecified headache type  History of CVA (cerebrovascular accident)   Patient presents lying above. Primary concern was for dissection given patient has history of similar. CTA has ruled out dissection. Patient is neurologically intact. No signs of infectious etiology. Headache was of acute onset. At this time she is treated for pain. She is counseled on following up with neurology for further evaluation and treatment of headache syndrome. Consideration is for migraine headache versus tension type headache with other etiology ruled out by CTA. New Prescriptions New Prescriptions   HYDROCODONE-ACETAMINOPHEN (NORCO/VICODIN) 5-325 MG TABLET    Take 1-2 tablets by mouth every 4 (four) hours as needed for moderate pain or severe pain.   ONDANSETRON (ZOFRAN ODT) 4 MG DISINTEGRATING TABLET    Take 1 tablet (4 mg total) by mouth every 4 (four) hours as needed for nausea or vomiting.     Charlesetta Shanks, MD 03/22/17 [phone]

## 2017-03-22 NOTE — ED Notes (Signed)
Pt informed of need for urine specimen. Pt states unable to void at this time. Will attempt to collect urine after fluid bolus.

## 2017-03-22 NOTE — ED Notes (Signed)
Patient transported to CT 

## 2017-03-22 NOTE — Consult Note (Signed)
Neurology Consult Note  Reason for Consultation: Headache, ? Vertebrobasilar dissection  Requesting provider: Charlesetta Shanks, MD  CC: Headache  HPI: This is a 31 year old right-handed woman who presents to the emergency department for evaluation of headache. History is obtained directly from the patient who is a good historian.  She reports that she has a severe left-sided headache that started earlier this morning. She says the headache is most intense in the back of her head, indicating the bioccipital region. She says pain radiates down into her neck as well as up into her head. The headache wraps around the head involving both temples and the forehead bilaterally, worse on the left side than the right. She has some sensitivity to light. She denies any sensitivity to sound, nausea, or vomiting. She does have some tingling in the left for head and left cheek but otherwise denies any neurologic deficits. She says she has never had a headache like this before. She presented to ED for further evaluation. Neurology was consulted as the patient has a prior history of right internal carotid artery occlusion and a stroke for which she was recently admitted a couple of weeks ago.  I reviewed her medical record. She was admitted to [facility] from 5/19-5/21/18. At that time, she presented with a complaint of episodic twitching, weakness, and numbness involving the left side of her face. MRI scan of the brain showed acute right frontal operculum infarction superimposed in the background or chronic ischemic change. CT angiogram of the head and neck showed an occluded right internal carotid artery at the cavernous segment of the vessel with distal filling of the right MCA and ACA from the left carotid circulation. Digital subtraction cerebral angiography was performed and again confirmed occlusion of the right internal carotid artery at the level of the ophthalmic artery. 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 She had hypercoagulable workup during that admission which was negative. She was placed on dual antiplatelet therapy with aspirin and Plavix and discharged home. She states that she had no residual deficits after this episode.  PMH:  1. Diabetes 2. Stroke 3. 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 IR ANGIO INTRA EXTRACRAN SEL COM CAROTID INNOMINATE BILAT MOD SED  03/05/2017  . IR ANGIO VERTEBRAL SEL VERTEBRAL BILAT MOD SED  03/05/2017  . IR ANGIOGRAM EXTREMITY LEFT  03/05/2017    Family history: Family History  Problem Relation Age of Onset  . Deep vein thrombosis Mother        Late 52s, unprovoked. Treated with warfarin indefinitely  . Diabetes Father   . Asthma Father   . COPD Father     Social history:  Social History   Social History  . Marital status: Single    Spouse name: N/A  . Number of children: N/A  . Years of education: N/A   Occupational History  . Not on file.   Social History Main Topics  . Smoking status: Never Smoker  . Smokeless tobacco: Never Used  . Alcohol use No  . Drug use: No  . Sexual activity: No   Other Topics Concern  . Not on file   Social History Narrative   Lived in the [place] since 1999, originally born in [place]. Enjoys spending time with family.     Current outpatient meds: Medications reviewed and reconciled.  Current Meds  Medication Sig  . atorvastatin (LIPITOR) 80 MG tablet Take 0.5 tablets (40 mg total) by mouth daily at 6 PM. (Patient taking differently: Take 80 mg by mouth daily at 6 PM. )  . clopidogrel (PLAVIX) 75 MG tablet Take 1 tablet (75 mg total) by mouth daily.  Marland Kitchen glipiZIDE (GLUCOTROL) 5 MG tablet Take 0.5 tablets (2.5 mg total) by mouth daily before breakfast.  . HYDROcodone-acetaminophen (NORCO/VICODIN) 5-325 MG tablet Take 1 tablet by  mouth every 6 (six) hours as needed for moderate pain.  . metFORMIN (GLUCOPHAGE) 1000 MG tablet Take 1 tablet (1,000 mg total) by mouth daily with breakfast.    Current inpatient meds: Medications reviewed and reconciled.  No current facility-administered medications for this encounter.    Current Outpatient Prescriptions  Medication Sig Dispense Refill  . atorvastatin (LIPITOR) 80 MG tablet Take 0.5 tablets (40 mg total) by mouth daily at 6 PM. (Patient taking differently: Take 80 mg by mouth daily at 6 PM. )    . clopidogrel (PLAVIX) 75 MG tablet Take 1 tablet (75 mg total) by mouth daily. 30 tablet 0  . glipiZIDE (GLUCOTROL) 5 MG tablet Take 0.5 tablets (2.5 mg total) by mouth daily before breakfast. 30 tablet 0  . HYDROcodone-acetaminophen (NORCO/VICODIN) 5-325 MG tablet Take 1 tablet by mouth every 6 (six) hours as needed for moderate pain.    . metFORMIN (GLUCOPHAGE) 1000 MG tablet Take 1 tablet (1,000 mg total) by mouth daily with breakfast. 30 tablet 1  . aspirin 325 MG EC tablet Take 1 tablet (325 mg total) by mouth daily. (Patient not taking: Reported on 03/22/2017) 30 tablet 0    Allergies: Allergies  Allergen Reactions  . Shellfish Allergy Anaphylaxis    ROS: As per HPI. A full 14-point review of systems was performed and is otherwise unremarkable.   PE:  BP 123/84   Pulse 83   Temp 97.6 F (36.4 C) (Oral)   Resp (!) 25   LMP 03/10/2017   SpO2 100%   General: WDWN, lying on ED gurney. She is complaining of headache and appears uncomfortable. Overhead lights are off. AAO x4. Speech clear, no dysarthria. No aphasia. Follows commands briskly.  HEENT: Normocephalic. Neck supple without LAD. She has tenderness to palpation over the occipital region. There is no point tenderness over the origins of either of the greater occipital nerves and she has no radiating pain with palpation of the area. MMM, OP clear. Dentition good. Sclerae anicteric. No conjunctival injection.  CV:  Regular, no murmur. Carotid pulses full and symmetric, no bruits. Distal pulses 2+ and symmetric.  Lungs: CTAB.  Abdomen: Soft, non-distended, non-tender. Bowel sounds present x4.  Extremities: No C/C/E. Neuro:  CN: Pupils are equal and round. They are symmetrically reactive from 3-->2 mm. EOMI without nystagmus. No reported diplopia. Facial sensation is intact to light touch and pinprick. She reports subjective dysesthesias over the left forehead and left cheek. Face is symmetric at rest with normal strength and mobility. Hearing is intact to conversational voice. Palate elevates symmetrically and uvula is midline. Voice is normal in tone, pitch and quality. Bilateral SCM and trapezii are 5/5. Tongue is midline with normal bulk and mobility.  Motor: Normal bulk, tone, and strength. No tremor or other abnormal movements. No drift.  Sensation: Intact to light touch and pinprick.  DTRs: 2+, symmetric. Toes downgoing bilaterally. No pathologic reflexes.  Coordination: Finger-to-nose and heel-to-shin are without dysmetria. Finger taps are normal in amplitude and speed, no decrement.  Labs:  Lab Results  Component Value Date   WBC 4.8 03/22/2017   HGB 9.9 (L) 03/22/2017   HCT 29.0 (L) 03/22/2017   PLT 152 03/22/2017   GLUCOSE 385 (H) 03/22/2017   CHOL 111 03/04/2017   TRIG 217 (H) 03/04/2017   HDL 23 (L) 03/04/2017   LDLCALC 45 03/04/2017   ALT 18 03/22/2017   AST 22 03/22/2017   NA 137 03/22/2017   K 3.9 03/22/2017   CL 102 03/22/2017   CREATININE 0.30 (L) 03/22/2017   BUN 7 03/22/2017   CO2 22 03/22/2017   INR 1.02 03/22/2017   HGBA1C 9.7 (H) 03/04/2017   From 5/19-5/20/18: Homocysteine 6.3 Protein S activity normal Rheumatoid factor negative Total protein C normal Protein C activity normal ANA negative SS-A and SS-B antibodies negative Antithrombin III normal Factor V Leiden negative Antiphospholipid antibodies negative Alpha galactosidase normal HIV nonreactive ANCA  panel negative Mpo/pr-3 antibodies negative CRP less than 0.8 ESR 18 Von Willebrand's panel notable for mild elevation in factor VIII which is nonspecific.   Imaging:  I have personally and independently reviewed CT angiogram of the head and neck from today. This shows occlusion of the distal right internal carotid artery with reconstitution of the right MCA and right ACA via the anterior communicating artery and left carotid circulation. This is unchanged when compared to a prior CT angiogram from 03/03/17.  I have personally and independently reviewed CT head without contrast from today. No acute abnormalities are seen. Minor degree of patchy white matter changes are seen. There is focal area of hypodensity in the right frontal operculum consistent with prior infarct.   Assessment and Plan:  1. Headache: Headache as described is most suggestive of a tension-type headache. She has muscular tenderness in the occipital/superior cervical region which intensifies her pain. Does not have any associated nausea or phonophobia though does have mild photophobia. Headache is not suggestive of migraine. CT angiography does not show any evidence of an acute dissection or any other new pathology and is unchanged from her prior study several weeks ago. At this point, recommend symptomatic management for headache.  2. Dysesthesias: She has some tingling involving the left side of her face. This is patchy and subjective. On examination, she has intact light touch and pinprick over the left side of her face. This may represent recrudescence of prior deficits. It could also be related to her headache. No specific intervention necessary apart from treatment of her headache.  3. Recent stroke: She recently had a right frontal lobe infarct in the setting of right carotid occlusion. Continue aspirin and Plavix. Continue risk factor modification. No acute abnormalities on imaging and nothing on examination suggest that  she has had additional stroke. No further workup necessary.  4. Right internal carotid artery occlusion: This is chronic, likely due to prior arterial dissection. Continue aspirin and Plavix.  This was discussed with the patient. Education was provided on the diagnosis and expected evaluation and treatment. She is in agreement with the plan as noted. She was given the opportunity to ask any questions and these were addressed to her satisfaction.   I discussed the above impression and recommendations with the ED attending, Dr. Johnney Killian. I have no further recommendations and will sign off. Please call if any new issues should arise.

## 2017-03-22 NOTE — ED Notes (Signed)
Pt returned from CT °

## 2017-03-22 NOTE — ED Notes (Signed)
IV placed per primary nurse's request.

## 2017-03-27 ENCOUNTER — Telehealth: Payer: Self-pay | Admitting: Internal Medicine

## 2017-03-27 NOTE — Telephone Encounter (Signed)
I don't think I will get to them until 3pm at the earliest but can try.

## 2017-03-27 NOTE — Telephone Encounter (Signed)
Patient has called about her FMLA FORMS.  Patient sates her due date for them to be completed was yesterday with [employer].  Patient needs forms to be completed today if possible.  They have been placed in your box to be completed.  Please Advise if you can complete them by today per her job's urgent request.

## 2017-04-10 ENCOUNTER — Ambulatory Visit (INDEPENDENT_AMBULATORY_CARE_PROVIDER_SITE_OTHER): Payer: Self-pay | Admitting: Internal Medicine

## 2017-04-10 ENCOUNTER — Encounter: Payer: Self-pay | Admitting: Internal Medicine

## 2017-04-10 VITALS — BP 134/86 | HR 90 | Temp 98.2°F | Wt 212.6 lb

## 2017-04-10 DIAGNOSIS — S8011XD Contusion of right lower leg, subsequent encounter: Secondary | ICD-10-CM

## 2017-04-10 DIAGNOSIS — S8011XA Contusion of right lower leg, initial encounter: Secondary | ICD-10-CM

## 2017-04-10 DIAGNOSIS — E669 Obesity, unspecified: Secondary | ICD-10-CM

## 2017-04-10 DIAGNOSIS — Z9114 Patient's other noncompliance with medication regimen: Secondary | ICD-10-CM

## 2017-04-10 DIAGNOSIS — Z8673 Personal history of transient ischemic attack (TIA), and cerebral infarction without residual deficits: Secondary | ICD-10-CM

## 2017-04-10 DIAGNOSIS — E119 Type 2 diabetes mellitus without complications: Secondary | ICD-10-CM

## 2017-04-10 DIAGNOSIS — X58XXXD Exposure to other specified factors, subsequent encounter: Secondary | ICD-10-CM

## 2017-04-10 LAB — GLUCOSE, CAPILLARY: Glucose-Capillary: 396 mg/dL — ABNORMAL HIGH (ref 65–99)

## 2017-04-10 MED ORDER — GLUCOSE BLOOD VI STRP: ORAL_STRIP | 12 refills | Status: DC

## 2017-04-10 MED ORDER — TRUE METRIX METER DEVI: 1.0000 [IU] | Freq: Three times a day (TID) | 0 refills | Status: DC

## 2017-04-10 MED ORDER — LANCETS MISC: 2 refills | Status: DC

## 2017-04-10 MED ORDER — GLIPIZIDE 5 MG PO TABS: 2.5000 mg | ORAL_TABLET | Freq: Every day | ORAL | 2 refills | Status: DC

## 2017-04-10 MED ORDER — ATORVASTATIN CALCIUM 40 MG PO TABS: 40.0000 mg | ORAL_TABLET | Freq: Every day | ORAL | 3 refills | Status: DC

## 2017-04-10 MED ORDER — GLIPIZIDE 10 MG PO TABS: 10.0000 mg | ORAL_TABLET | Freq: Every day | ORAL | 2 refills | Status: DC

## 2017-04-10 MED ORDER — METFORMIN HCL 500 MG PO TABS: 500.0000 mg | ORAL_TABLET | Freq: Every day | ORAL | 0 refills | Status: DC

## 2017-04-10 MED ORDER — METFORMIN HCL 1000 MG PO TABS: 1000.0000 mg | ORAL_TABLET | Freq: Two times a day (BID) | ORAL | 3 refills | Status: DC

## 2017-04-10 NOTE — Patient Instructions (Addendum)
Thank you for visiting clinic today. Please check your blood sugar 2-3 times daily before meals and at bedtime and bring your glucometer with your next appointment. As we discussed to restart taking metformin 500 mg daily . I am increasing your glipizide dose to 10 mg daily. Please follow-up with [facility] for diabetes management and weight loss. Please follow-up in 2 weeks. Please follow-up with  neurology according to your appointment on 04/19/2017.

## 2017-04-10 NOTE — Assessment & Plan Note (Signed)
It has been resolved.

## 2017-04-10 NOTE — Assessment & Plan Note (Signed)
She was recently seen in ED on 03/22/2017 with complaint of intractable headache, repeat CTA was negative for any dissection and she was neurologically intact. She was advised to follow-up with neurology after pain management,Appointment was made for 04/19/2017.  She was still having intermittent headaches. Her neurologic exam was within normal limits. Her headache respond to icing.  She was advised to keep her appointment with neurology.

## 2017-04-10 NOTE — Progress Notes (Signed)
   CC: For follow-up of her diabetes.  HPI:  Ms.Anasofia Ismaili is a 31 y.o. with past medical history significant for newly diagnosed diabetes and CVA with right frontal lobe infarct in May 2018 due to occlusion of right internal artery, on CTA done was more consistent with chronic right internal carotid artery dissection. She was recently seen in ED on 03/22/2017 with complaint of intractable headache, repeat CTA was negative for any dissection and she was neurologically intact. She was advised to follow-up with neurology after pain management,Appointment was made for 04/19/2017.  She was diagnosed with diabetes in May 2018 during her admission for CVA, A1c was 9.7% , she was started on metformin 500 mg and glipizide 2.5 mg daily. during office visit on 03/13/2017 her metformin was increased to 1000 mg. patient seems very little insight of her disease, she was still having couple of tablets left in her 500 mg bottle which was given 2 month ago, she do not take her medications regularly stating that it causes diarrhea. She never picked up her prescription for 1000 mg. Her blood sugar during her recent ED visit on 03/22/2017 was above 300. Her blood sugar in our clinic today was 396. She do not have a glucometer meter and do not check her blood sugar at home.  Her right leg hematoma has been Ressolved.  She was complaining of intermittent headaches, according to patient Tylenol or Advil does not help but icing help alleviating the pain. She has an upcoming appointment with neurology on 04/19/2017.  Past Medical History:  Diagnosis Date  . Diabetes mellitus without complication ([place])     Review of Systems:  As per HPI.  Physical Exam:  Vitals:   04/10/17 1049  BP: 134/86  Pulse: 90  Temp: 98.2 F (36.8 C)  TempSrc: Oral  SpO2: 100%  Weight: 212 lb 9.6 oz (96.4 kg)    General: Vital signs reviewed.  Patient is well-developed and obese lady, in no acute distress and cooperative with  exam.  Cardiovascular: RRR, S1 normal, S2 normal, no murmurs, gallops, or rubs. Pulmonary/Chest: Clear to auscultation bilaterally, no wheezes, rales, or rhonchi. Abdominal: Soft, non-tender, non-distended, BS +, no masses, organomegaly, or guarding present.  Extremities: No lower extremity edema bilaterally,  pulses symmetric and intact bilaterally. No cyanosis or clubbing. Neurological: A&O x3, Strength is normal and symmetric bilaterally, cranial nerve II-XII are grossly intact, no focal motor deficit, sensory intact to light touch bilaterally.  Skin: Warm, dry and intact. No rashes or erythema. Psychiatric: Normal mood and affect. speech and behavior is normal. Cognition and memory are normal.  Assessment & Plan:   See Encounters Tab for problem based charting.  Patient discussed with Dr. Lynnae January.

## 2017-04-10 NOTE — Assessment & Plan Note (Signed)
She was diagnosed with diabetes in May 2018 during her admission for CVA, A1c was 9.7% , she was started on metformin 500 mg and glipizide 2.5 mg daily. during office visit on 03/13/2017 her metformin was increased to 1000 mg. patient seems very little insight of her disease, she was still having couple of tablets left in her 500 mg bottle which was given 2 month ago, she do not take her medications regularly stating that it causes diarrhea. She never picked up her prescription for 1000 mg. Her blood sugar during her recent ED visit on 03/22/2017 was above 300. Her blood sugar in our clinic today was 396. She do not have a glucometer meter and do not check her blood sugar at home.  -Continue metformin 500 mg daily, I advised the patient to take it regularly for next 2 weeks, if her diarrhea does not subside or get worse during next follow-up visit we will try getting her Victoza. -Increase glipizide from 2.5 mg to 10 mg daily. -She was also provided with prescription for glucometer meter at [place] with the [facility] program. And advised to check her blood sugar regularly at home and bring glucometer meter during next appointment. -Follow-up in 2 weeks for reassessment.  - She also needs a good diabetic education, it appears that patient has no insight of her disease and being noncompliant.-Referral was placed for [facility].

## 2017-04-10 NOTE — Progress Notes (Signed)
Internal Medicine Clinic Attending  Case discussed with Dr. Amin at the time of the visit.  We reviewed the resident's history and exam and pertinent patient test results.  I agree with the assessment, diagnosis, and plan of care documented in the resident's note.    

## 2017-04-19 ENCOUNTER — Telehealth: Payer: Self-pay

## 2017-04-19 ENCOUNTER — Institutional Professional Consult (permissible substitution): Payer: Self-pay | Admitting: Neurology

## 2017-04-19 NOTE — Telephone Encounter (Signed)
Pt did not show for their appt with Dr. Athar today.  

## 2017-04-20 ENCOUNTER — Encounter (HOSPITAL_COMMUNITY): Payer: Self-pay | Admitting: Emergency Medicine

## 2017-04-20 ENCOUNTER — Emergency Department (HOSPITAL_COMMUNITY): Payer: Self-pay

## 2017-04-20 ENCOUNTER — Emergency Department (HOSPITAL_COMMUNITY)
Admission: EM | Admit: 2017-04-20 | Discharge: 2017-04-20 | Disposition: A | Discharge status: Home / Self Care | Payer: Self-pay | Attending: Emergency Medicine | Admitting: Emergency Medicine

## 2017-04-20 DIAGNOSIS — Z8673 Personal history of transient ischemic attack (TIA), and cerebral infarction without residual deficits: Secondary | ICD-10-CM | POA: Insufficient documentation

## 2017-04-20 DIAGNOSIS — Z7984 Long term (current) use of oral hypoglycemic drugs: Secondary | ICD-10-CM | POA: Insufficient documentation

## 2017-04-20 DIAGNOSIS — R0600 Dyspnea, unspecified: Secondary | ICD-10-CM | POA: Insufficient documentation

## 2017-04-20 DIAGNOSIS — E119 Type 2 diabetes mellitus without complications: Secondary | ICD-10-CM | POA: Insufficient documentation

## 2017-04-20 DIAGNOSIS — Z7982 Long term (current) use of aspirin: Secondary | ICD-10-CM | POA: Insufficient documentation

## 2017-04-20 DIAGNOSIS — Z79899 Other long term (current) drug therapy: Secondary | ICD-10-CM | POA: Insufficient documentation

## 2017-04-20 DIAGNOSIS — Z7902 Long term (current) use of antithrombotics/antiplatelets: Secondary | ICD-10-CM | POA: Insufficient documentation

## 2017-04-20 DIAGNOSIS — R079 Chest pain, unspecified: Secondary | ICD-10-CM | POA: Insufficient documentation

## 2017-04-20 HISTORY — DX: Cerebral infarction, unspecified: I63.9

## 2017-04-20 LAB — BASIC METABOLIC PANEL
ANION GAP: 9 (ref 5–15)
BUN: 11 mg/dL (ref 6–20)
CHLORIDE: 103 mmol/L (ref 101–111)
CO2: 21 mmol/L — AB (ref 22–32)
Calcium: 9.2 mg/dL (ref 8.9–10.3)
Creatinine, Ser: 0.56 mg/dL (ref 0.44–1.00)
GFR calc non Af Amer: >60 mL/min (ref >60)
Glucose, Bld: 426 mg/dL — ABNORMAL HIGH (ref 65–99)
POTASSIUM: 3.7 mmol/L (ref 3.5–5.1)
SODIUM: 133 mmol/L — AB (ref 135–145)

## 2017-04-20 LAB — CBC
HEMATOCRIT: 31.9 % — AB (ref 36.0–46.0)
HEMOGLOBIN: 10.6 g/dL — AB (ref 12.0–15.0)
MCH: 26.3 pg (ref 26.0–34.0)
MCHC: 33.2 g/dL (ref 30.0–36.0)
MCV: 79.2 fL (ref 78.0–100.0)
PLATELETS: 170 10*3/uL (ref 150–400)
RBC: 4.03 MIL/uL (ref 3.87–5.11)
RDW: 15.7 % — ABNORMAL HIGH (ref 11.5–15.5)
WBC: 7.6 10*3/uL (ref 4.0–10.5)

## 2017-04-20 LAB — I-STAT TROPONIN, ED: Troponin i, poc: 0 ng/mL (ref 0.00–0.08)

## 2017-04-20 MED ORDER — PANTOPRAZOLE SODIUM 20 MG PO TBEC: 20.0000 mg | DELAYED_RELEASE_TABLET | Freq: Every day | ORAL | 0 refills | Status: DC

## 2017-04-20 MED ORDER — PANTOPRAZOLE SODIUM 20 MG PO TBEC
20.0000 mg | DELAYED_RELEASE_TABLET | Freq: Once | ORAL | Status: DC
  Filled 2017-04-20: qty 1

## 2017-04-20 MED ORDER — GI COCKTAIL ~~LOC~~
30.0000 mL | Freq: Once | ORAL | Status: DC
  Filled 2017-04-20: qty 30

## 2017-04-20 NOTE — ED Triage Notes (Signed)
Pt sts SOB and left sided facial numbness starting today; pt sts hx of stroke in May; pt sts taking anticoagulants

## 2017-04-20 NOTE — ED Provider Notes (Signed)
[facility] DEPT Provider Note   CSN: [id number] Arrival date & time: 04/20/17  1635     History   Chief Complaint Chief Complaint  Patient presents with  . Shortness of Breath    HPI Joann Meyers is a 31 y.o. female.  HPI Patient reports she was on her way to work when she got a general feeling of burning across her anterior upper chest and she felt short of breath. No radiation. She reports she tried to go on to work but didn't feel well. She denies she has any associated fever. The chest burning discomfort and shortness of breath have now resolved. She has not had any cough. No hemoptysis. No lower extremity swelling or calf pain. Patient denies history of similar discomfort. She does report however sometimes when she takes her medications it makes her central chest and epigastrium burn. Patient does take oral anticoagulants and has history of chronic dissection of the right internal carotid artery. Past Medical History:  Diagnosis Date  . Diabetes mellitus without complication ([place])   . Stroke [facility])     Patient Active Problem List   Diagnosis Date Noted  . Microcytic anemia 03/13/2017  . History of stroke 03/03/2017  . Type 2 diabetes mellitus ([place]) 03/03/2017    Past Surgical History:  Procedure Laterality Date  . IR ANGIO INTRA EXTRACRAN SEL COM CAROTID INNOMINATE BILAT MOD SED  03/05/2017  . IR ANGIO VERTEBRAL SEL VERTEBRAL BILAT MOD SED  03/05/2017  . IR ANGIOGRAM EXTREMITY LEFT  03/05/2017    OB History    Gravida Para Term Preterm AB Living   0 0 0 0 0 0   SAB TAB Ectopic Multiple Live Births   0 0 0 0 0       Home Medications    Prior to Admission medications   Medication Sig Start Date End Date Taking? Authorizing Provider  aspirin 325 MG EC tablet Take 1 tablet (325 mg total) by mouth daily. Patient not taking: Reported on 03/22/2017 03/13/17   Riccardo Dubin, MD  atorvastatin (LIPITOR) 40 MG tablet Take 1 tablet (40 mg total) by mouth daily at 6  PM. 04/10/17   Lorella Nimrod, MD  Blood Glucose Monitoring Suppl (TRUE METRIX METER) DEVI 1 Units by Does not apply route 3 (three) times daily. [place].Check blood sugar 3 times daily before meals. 04/10/17   Lorella Nimrod, MD  clopidogrel (PLAVIX) 75 MG tablet Take 1 tablet (75 mg total) by mouth daily. 03/13/17   Riccardo Dubin, MD  glipiZIDE (GLUCOTROL) 10 MG tablet Take 1 tablet (10 mg total) by mouth daily before breakfast. 04/10/17 04/10/18  Lorella Nimrod, MD  glucose blood (TRUE METRIX BLOOD GLUCOSE TEST) test strip IM [place].Use as instructed 04/10/17   Lorella Nimrod, MD  HYDROcodone-acetaminophen (NORCO/VICODIN) 5-325 MG tablet Take 1 tablet by mouth every 6 (six) hours as needed for moderate pain.    [provider]  HYDROcodone-acetaminophen (NORCO/VICODIN) 5-325 MG tablet Take 1-2 tablets by mouth every 4 (four) hours as needed for moderate pain or severe pain. 03/22/17   Charlesetta Shanks, MD  Lancets MISC Check your blood sugar 3 times daily before meals. 04/10/17   Lorella Nimrod, MD  metFORMIN (GLUCOPHAGE) 500 MG tablet Take 1 tablet (500 mg total) by mouth daily with breakfast. 04/10/17 04/10/18  Lorella Nimrod, MD  ondansetron (ZOFRAN ODT) 4 MG disintegrating tablet Take 1 tablet (4 mg total) by mouth every 4 (four) hours as needed for nausea or vomiting.  03/22/17   Charlesetta Shanks, MD  pantoprazole (PROTONIX) 20 MG tablet Take 1 tablet (20 mg total) by mouth daily. 04/20/17   Charlesetta Shanks, MD    Family History Family History  Problem Relation Age of Onset  . Deep vein thrombosis Mother        Late 47s, unprovoked. Treated with warfarin indefinitely  . Diabetes Father   . Asthma Father   . COPD Father     Social History Social History  Substance Use Topics  . Smoking status: Never Smoker  . Smokeless tobacco: Never Used  . Alcohol use No     Allergies   Shellfish allergy   Review of Systems Review of Systems  10 Systems reviewed and are negative for acute  change except as noted in the HPI.  Physical Exam Updated Vital Signs BP 135/86 (BP Location: Right Arm)   Pulse 97   Temp 98.2 F (36.8 C)   Resp 20   SpO2 97%   Physical Exam  Constitutional: She appears well-developed and well-nourished. No distress.  HENT:  Head: Normocephalic and atraumatic.  Eyes: Conjunctivae are normal.  Neck: Neck supple.  Cardiovascular: Normal rate and regular rhythm.   No murmur heard. Pulmonary/Chest: Effort normal and breath sounds normal. No respiratory distress.  Abdominal: Soft. There is no tenderness.  Musculoskeletal: She exhibits no edema.  Neurological: She is alert.  Skin: Skin is warm and dry.  Psychiatric: She has a normal mood and affect.  Nursing note and vitals reviewed.    ED Treatments / Results  Labs (all labs ordered are listed, but only abnormal results are displayed) Labs Reviewed  BASIC METABOLIC PANEL - Abnormal; Notable for the following:       Result Value   Sodium 133 (*)    CO2 21 (*)    Glucose, Bld 426 (*)    All other components within normal limits  CBC - Abnormal; Notable for the following:    Hemoglobin 10.6 (*)    HCT 31.9 (*)    RDW 15.7 (*)    All other components within normal limits  I-STAT TROPOININ, ED    EKG  EKG Interpretation  Date/Time:  Friday April 20 2017 17:15:38 EDT Ventricular Rate:  96 PR Interval:    QRS Duration: 108 QT Interval:  361 QTC Calculation: 457 R Axis:   31 Text Interpretation:  Sinus rhythm Probable left atrial enlargement Low voltage, precordial leads Nonspecific T abnormalities, anterior leads no change from previous Confirmed by Charlesetta Shanks [phone]) on 04/20/2017 5:18:28 PM       Radiology No results found.  Procedures Procedures (including critical care time)  Medications Ordered in ED Medications  gi cocktail (Maalox,Lidocaine,Donnatal) (not administered)  pantoprazole (PROTONIX) EC tablet 20 mg (not administered)     Initial Impression /  Assessment and Plan / ED Course  I have reviewed the triage vital signs and the nursing notes.  Pertinent labs & imaging results that were available during my care of the patient were reviewed by me and considered in my medical decision making (see chart for details).      Final Clinical Impressions(s) / ED Diagnoses   Final diagnoses:  Chest pain, unspecified type  Dyspnea, unspecified type   Patient has significant history of known carotid dissection. Today's presentation however is most suggestive of GERD or musculoskeletal type chest pain. Symptoms started with a diffuse burning quality that she indicates over her anterior chest. Radiation. Symptoms have now resolved. Patient is not hypertensive.  I have low suspicion for thoracic type dissection. Low suspicion for PE. Will try empiric PPI. Patient is counseled on return precautions and has scheduled within the next 4 days. New Prescriptions New Prescriptions   PANTOPRAZOLE (PROTONIX) 20 MG TABLET    Take 1 tablet (20 mg total) by mouth daily.     Charlesetta Shanks, MD 04/20/17 1745

## 2017-04-20 NOTE — ED Notes (Signed)
Pt states she is no longer having numbness, tingling, or SOB.  She does state that she is having a bad headache in the very back of head.

## 2017-04-20 NOTE — ED Notes (Signed)
Pt states that she fell yesterday and hit head on chair seat.  States she felt dizzy prior to falling.

## 2017-04-20 NOTE — Discharge Instructions (Signed)
See your family doctor as planned in the next 4 days. If your symptoms worsen, change or new symptoms develop return to the emergency department. Start daily Protonix as prescribed.

## 2017-04-24 ENCOUNTER — Ambulatory Visit (INDEPENDENT_AMBULATORY_CARE_PROVIDER_SITE_OTHER): Payer: Self-pay | Admitting: Dietician

## 2017-04-24 ENCOUNTER — Ambulatory Visit: Payer: Self-pay

## 2017-04-24 ENCOUNTER — Ambulatory Visit (INDEPENDENT_AMBULATORY_CARE_PROVIDER_SITE_OTHER): Payer: Self-pay | Admitting: Internal Medicine

## 2017-04-24 ENCOUNTER — Encounter: Payer: Self-pay | Admitting: Internal Medicine

## 2017-04-24 DIAGNOSIS — E119 Type 2 diabetes mellitus without complications: Secondary | ICD-10-CM

## 2017-04-24 DIAGNOSIS — Z7982 Long term (current) use of aspirin: Secondary | ICD-10-CM

## 2017-04-24 DIAGNOSIS — Z8673 Personal history of transient ischemic attack (TIA), and cerebral infarction without residual deficits: Secondary | ICD-10-CM

## 2017-04-24 DIAGNOSIS — Z7984 Long term (current) use of oral hypoglycemic drugs: Secondary | ICD-10-CM

## 2017-04-24 DIAGNOSIS — Z713 Dietary counseling and surveillance: Secondary | ICD-10-CM

## 2017-04-24 MED ORDER — CLOPIDOGREL BISULFATE 75 MG PO TABS: 75.0000 mg | ORAL_TABLET | Freq: Every day | ORAL | 2 refills | Status: DC

## 2017-04-24 MED ORDER — GLIPIZIDE 10 MG PO TABS: 10.0000 mg | ORAL_TABLET | Freq: Every day | ORAL | 2 refills | Status: DC

## 2017-04-24 MED ORDER — ASPIRIN 325 MG PO TBEC: 325.0000 mg | DELAYED_RELEASE_TABLET | Freq: Every day | ORAL | 2 refills | Status: DC

## 2017-04-24 MED ORDER — GLUCOSE BLOOD VI STRP
ORAL_STRIP | 12 refills | Status: DC
Start: 2017-04-24 — End: 2017-06-17

## 2017-04-24 MED ORDER — ATORVASTATIN CALCIUM 40 MG PO TABS: 40.0000 mg | ORAL_TABLET | Freq: Every day | ORAL | 3 refills | Status: DC

## 2017-04-24 MED ORDER — METFORMIN HCL 500 MG PO TABS: 500.0000 mg | ORAL_TABLET | Freq: Every day | ORAL | 2 refills | Status: DC

## 2017-04-24 NOTE — Patient Instructions (Addendum)
Thank you for seeing [place] today.   Please start taking your diabetes medications as soon as possible. These will help control your blood sugar and may help with your headaches, nausea, and increased urination frequency. Please continue to test your blood sugar three times a day. Please bring a log of your blood sugars and your medications with you to your next visit.  Your medications include the following: 1) Metformin, take one 500 mg tablet per day 2) Glipizide, take one 10 mg tablet per day  Please follow up with your neurologist regarding your headaches and previous stroke. You have an appointment in August. Please bring your medications to your appointment so your doctor can see what you take.   Continue taking your medications to prevent stroke, including the following: 1) Plavix, take one 75 mg tablet per day 2) Aspirin, take one 325 mg tablet per day 3) Lipitor, take one 40 mg tablet per day  Follow up in this clinic in 3-4 weeks to see how your blood sugars are doing.

## 2017-04-24 NOTE — Assessment & Plan Note (Signed)
The patient reports intermittent headaches which have been worked up over the past month(s). They are not concerning for recurrent stroke at this time, as the patient reports no neurologic defects. The patient was told to continue supportive care for her headaches. The patient reports that she has run out of medications to prevent her stroke, so refills for her medications were given. Per chart review the patient should be on dual anti-platelet therapy for at least 3 months and a statin as well. She was given electronic and paper prescriptions to make sure she can obtain these medications. She will be called later this week to ensure she had no difficulty obtaining medications.  Continue/Restart the following medications: - Atorvastatin, 40 mg daily - Aspirin, 325 mg daily - Clopidigrel, 75 mg daily  Patient plans to follow up with Neurologist next month regarding her history of stroke and headaches.

## 2017-04-24 NOTE — Assessment & Plan Note (Addendum)
The patient has been unable to fill her medications prescribed at her last visit. The patient was told about the importance of taking all diabetes medications as previously prescribed. She was instructed that this medication may help with her intermittent nausea and headaches, since these symptoms can result from fluctuations in blood sugar. Since she had difficulty obtaining prescriptions after last visit, the patient was sent electronic prescriptions to her preferred pharmacy and given paper prescriptions. She will be called later this week to ensure she had no difficulty obtaining medications.  Restart previously prescribed regimen including: - Metformin 500 mg daily - Glipizide 10 mg daily  She received diabetes education today in Clinic with RD Debera Lat. Her advice is appreciated.   The patient will follow up in 1 month for A1C recheck and reassessment of DM medication regimen.

## 2017-04-24 NOTE — Progress Notes (Signed)
Diabetes Self-Management Education  Visit Type: First/Initial  Appt. Start Time: 1100 Appt. End Time: 3382  04/24/2017  [name], identified by name and date of birth, is a 31 y.o. female with a diagnosis of Diabetes: Type 2.   ASSESSMENT  Last menstrual period 04/04/2017. There is no height or weight on file to calculate BMI.      Diabetes Self-Management Education - 04/24/17 1600      Visit Information   Visit Type First/Initial     Initial Visit   Diabetes Type Type 2   Are you currently following a meal plan? Yes  tries to limit starchy and sugary foods   What type of meal plan do you follow? tries to limit carbs   Are you taking your medications as prescribed? No   Date Diagnosed 2018     Health Coping   How would you rate your overall health? Good     Psychosocial Assessment   Self-care barriers Lack of material resources   Self-management support Doctor's office;Family;CDE visits  her mother is our patient and has diabetes   Patient Concerns Nutrition/Meal planning;Medication;Monitoring   Preferred Learning Style No preference indicated   Learning Readiness Contemplating   How often do you need to have someone help you when you read instructions, pamphlets, or other written materials from your doctor or pharmacy? 1 - Never     Pre-Education Assessment   Patient understands the diabetes disease and treatment process. Needs Instruction   Patient understands incorporating nutritional management into lifestyle. Needs Instruction   Patient undertands incorporating physical activity into lifestyle. Demonstrates understanding / competency   Patient understands using medications safely. Needs Instruction   Patient understands monitoring blood glucose, interpreting and using results Needs Instruction   Patient understands prevention, detection, and treatment of acute complications. Needs Instruction   Patient understands prevention, detection, and treatment of  chronic complications. Needs Instruction   Patient understands how to develop strategies to address psychosocial issues. Needs Instruction   Patient understands how to develop strategies to promote health/change behavior. Needs Instruction     Complications   Last HgB A1C per patient/outside source 9.7 %   How often do you check your blood sugar? 3-4 times / week   Fasting Blood glucose range (mg/dL) 70-129   Number of hypoglycemic episodes per month --  0   Number of hyperglycemic episodes per week 0   Have you had a dilated eye exam in the past 12 months? No   Have you had a dental exam in the past 12 months? No   Are you checking your feet? Yes   How many days per week are you checking your feet? 7     Dietary Intake   Breakfast skips   Lunch eats cheese 11am   Snack (afternoon) sometimes chips   Dinner big salad with fruit, crackers   Beverage(s) water     Exercise   Exercise Type ADL's;Light (walking / raking leaves)     Patient Education   Previous Diabetes Education No   Disease state  Factors that contribute to the development of diabetes   Nutrition management  Role of diet in the treatment of diabetes and the relationship between the three main macronutrients and blood glucose level   Medications Reviewed patients medication for diabetes, action, purpose, timing of dose and side effects.   Monitoring Purpose and frequency of SMBG.;Daily foot exams      Individualized Plan for Diabetes Self-Management Training:   Learning  Objective:  Patient will have a greater understanding of diabetes self-management. Patient education plan is to attend individual and/or group sessions per assessed needs and concerns.  .diabetes Plan:   Patient Instructions  Check blood sugar a few times a week and bring meter to office appointments.   Expected Outcomes:     Education material provided: Living Well with Diabetes  If problems or questions, patient to contact team via:   Phone  Future DSME appointment:   4-6 weeks Carmencita Cusic, Butch Penny, [place] 04/24/2017 4:39 PM.

## 2017-04-24 NOTE — Patient Instructions (Addendum)
Joann Meyers,   It was great meeting you today!   I suggest checking your  blood sugar a few times a week and bring meter to office appointments.  Take the glipizide with your first food of the day- 11 AM with cheese and.....  Here are some websites about diabetes where you may read about or meet others your age with diabetes.:  Recommended Diabetes Websites  (in alphabetical order)  American Diabetes Association (diabetes.org) Proor.no BehavioralDiabetesInstitute.org DiabetesMine.com Diatribe.org PressJungle.tn WetCurls.fr Insulindependence.org JDRF.org Joslin.org EnviroConcern.si Mendosa.com BasicStudents.dk http://www.zhang.org/ Social Communities #DSMA Special educational needs teacher) DiabetesSisters.org (female-specific) DiabeticConnect.com DiabetesDaily.com EstuDiabetes (Spanish) MyGlu.org (Type 1 diabetes-specific) Juvenation.org (Type 1 diabetes-specific) PatientsLikeMe.com (Various disease states) TypeOneNation.org (Type 1 diabetes-specific) TuDiabetes.org   Diabetes Online Communities, Blogs & Forums These two links are full of links to some of the most bustling diabetes communities and the longest list of diabetes blogs I've seen:  [employer] DiabetesMine's Blog Roll : See you in about 4 weeks.  Butch Penny

## 2017-04-24 NOTE — Progress Notes (Signed)
   CC: Diabetes Follow Up  HPI:  Joann Meyers is a 31 y.o. with history of stroke and DM. The patient present for a follow up of her diabetes management, as her medications were changed during her last clinic visit. The patient reports that she had difficulty obtaining medications prescribed to her at last visit because they were sent to the wrong pharmacy. Because of this she has not taken any of her medications for diabetes. She also states she is out of her stroke medications.   She continues to endorse intermittent headaches which originate at the back of her head and travel down the back of her neck. She has been evaluated by multiple providers for these headaches since June and states that they are slowly getting better. She endorses nausea and denies vomiting, photophobia, and phonophobia. The thing that helps most with her headache is icing her head and neck.  Past Medical History:  Diagnosis Date  . Diabetes mellitus without complication ([place])   . Stroke [facility])    Review of Systems:   Patient endorses headaches, fatigue, increased urination.  Patient denies shortness of breath, chest pain, changes in vision, dizziness, focal weakness, and difficulties with speech.  Physical Exam:  Vitals:   04/24/17 1052  BP: 126/88  Pulse: 78  Temp: 98.2 F (36.8 C)  TempSrc: Oral  SpO2: 98%  Weight: 206 lb 14.4 oz (93.8 kg)  Height: 5\' 2"  (1.575 m)   Physical Exam  Constitutional: She is oriented to person, place, and time.  Cardiovascular: Normal rate, regular rhythm, normal heart sounds and intact distal pulses.  Exam reveals no friction rub.   No murmur heard. Pulmonary/Chest: Effort normal and breath sounds normal. No respiratory distress. She has no wheezes.  Abdominal: Soft. Bowel sounds are normal. She exhibits no distension. There is no tenderness.  Neurological: She is alert and oriented to person, place, and time.  EOM intact. Facial strength and sensation grossly  intact bilaterally. Tongue midline. Gross motor and sensation intact bilaterally in upper and lower extremities.   Skin: Skin is warm and dry. Capillary refill takes less than 2 seconds. No rash noted. No erythema.    Assessment & Plan:   See Encounters Tab for problem based charting.  Patient seen with Dr. Daryll Drown.

## 2017-04-25 ENCOUNTER — Other Ambulatory Visit: Payer: Self-pay | Admitting: Internal Medicine

## 2017-04-25 DIAGNOSIS — E119 Type 2 diabetes mellitus without complications: Secondary | ICD-10-CM

## 2017-04-25 NOTE — Progress Notes (Signed)
Internal Medicine Clinic Attending  I saw and evaluated the patient.  I personally confirmed the key portions of the history and exam documented by Dr. Nedrud and I reviewed pertinent patient test results.  The assessment, diagnosis, and plan were formulated together and I agree with the documentation in the resident's note.  

## 2017-05-16 ENCOUNTER — Encounter (HOSPITAL_COMMUNITY): Payer: Self-pay | Admitting: *Deleted

## 2017-05-16 DIAGNOSIS — L02211 Cutaneous abscess of abdominal wall: Secondary | ICD-10-CM | POA: Insufficient documentation

## 2017-05-16 LAB — COMPREHENSIVE METABOLIC PANEL
ALT: 17 U/L (ref 14–54)
ANION GAP: 8 (ref 5–15)
AST: 22 U/L (ref 15–41)
Albumin: 3.9 g/dL (ref 3.5–5.0)
Alkaline Phosphatase: 75 U/L (ref 38–126)
BUN: 8 mg/dL (ref 6–20)
CHLORIDE: 102 mmol/L (ref 101–111)
CO2: 24 mmol/L (ref 22–32)
Calcium: 9.3 mg/dL (ref 8.9–10.3)
Creatinine, Ser: 0.47 mg/dL (ref 0.44–1.00)
GFR calc Af Amer: >60 mL/min (ref >60)
GFR calc non Af Amer: >60 mL/min (ref >60)
GLUCOSE: 344 mg/dL — AB (ref 65–99)
POTASSIUM: 3.6 mmol/L (ref 3.5–5.1)
SODIUM: 134 mmol/L — AB (ref 135–145)
Total Bilirubin: 0.9 mg/dL (ref 0.3–1.2)
Total Protein: 6.5 g/dL (ref 6.5–8.1)

## 2017-05-16 LAB — CBC WITH DIFFERENTIAL/PLATELET
Basophils Absolute: 0 10*3/uL (ref 0.0–0.1)
Basophils Relative: 0 %
EOS PCT: 3 %
Eosinophils Absolute: 0.2 10*3/uL (ref 0.0–0.7)
HCT: 32.3 % — ABNORMAL LOW (ref 36.0–46.0)
Hemoglobin: 10.8 g/dL — ABNORMAL LOW (ref 12.0–15.0)
LYMPHS ABS: 1.3 10*3/uL (ref 0.7–4.0)
LYMPHS PCT: 20 %
MCH: 25.7 pg — AB (ref 26.0–34.0)
MCHC: 33.4 g/dL (ref 30.0–36.0)
MCV: 76.9 fL — AB (ref 78.0–100.0)
MONO ABS: 0.4 10*3/uL (ref 0.1–1.0)
Monocytes Relative: 5 %
Neutro Abs: 4.9 10*3/uL (ref 1.7–7.7)
Neutrophils Relative %: 72 %
PLATELETS: 207 10*3/uL (ref 150–400)
RBC: 4.2 MIL/uL (ref 3.87–5.11)
RDW: 14.6 % (ref 11.5–15.5)
WBC: 6.8 10*3/uL (ref 4.0–10.5)

## 2017-05-16 LAB — I-STAT CG4 LACTIC ACID, ED: LACTIC ACID, VENOUS: 1.87 mmol/L (ref 0.5–1.9)

## 2017-05-16 NOTE — ED Triage Notes (Signed)
Pt c/o abscess to LL abdomen that she noticed today. Large reddened area noted to lower abdomen with foul smelling drainage. Denies fever, reports "feeling cold"

## 2017-05-17 ENCOUNTER — Emergency Department (HOSPITAL_COMMUNITY)
Admission: EM | Admit: 2017-05-17 | Discharge: 2017-05-17 | Discharge status: Against Medical Advice | Payer: Self-pay | Attending: Emergency Medicine | Admitting: Emergency Medicine

## 2017-05-17 ENCOUNTER — Telehealth: Payer: Self-pay

## 2017-05-17 ENCOUNTER — Ambulatory Visit: Payer: Self-pay | Admitting: Neurology

## 2017-05-17 NOTE — ED Notes (Signed)
Pt called for in lobby for vital sign reassessment. No answer no response x3.

## 2017-05-17 NOTE — ED Notes (Signed)
Unable to locate pt  

## 2017-05-17 NOTE — Telephone Encounter (Signed)
PATIENT NO SHOW FOR APPT TODAY FOR HOSPITAL FOLLOW UP.

## 2017-05-17 NOTE — ED Notes (Signed)
Pt called for in waiting area for vital signs on answer.

## 2017-05-18 ENCOUNTER — Encounter: Payer: Self-pay | Admitting: Neurology

## 2017-05-28 ENCOUNTER — Institutional Professional Consult (permissible substitution): Payer: MEDICAID | Admitting: Neurology

## 2017-05-28 NOTE — Telephone Encounter (Signed)
Please follow dismissal protocol as per our No Show Policy (2 x NS for new sleep consult). Dr. Erlinda Hong patient.

## 2017-05-28 NOTE — Telephone Encounter (Signed)
Pt did not show for their appt with Dr. Rexene Alberts today. This is pt's second no-show for a sleep consult with Dr. Rexene Alberts.

## 2017-05-29 ENCOUNTER — Encounter: Payer: Self-pay | Admitting: Neurology

## 2017-06-04 ENCOUNTER — Emergency Department (HOSPITAL_COMMUNITY)
Admission: EM | Admit: 2017-06-04 | Discharge: 2017-06-04 | Disposition: A | Discharge status: Home / Self Care | Payer: Self-pay | Attending: Emergency Medicine | Admitting: Emergency Medicine

## 2017-06-04 ENCOUNTER — Encounter (HOSPITAL_COMMUNITY): Payer: Self-pay | Admitting: Nurse Practitioner

## 2017-06-04 DIAGNOSIS — R739 Hyperglycemia, unspecified: Secondary | ICD-10-CM

## 2017-06-04 DIAGNOSIS — M545 Low back pain: Secondary | ICD-10-CM | POA: Insufficient documentation

## 2017-06-04 DIAGNOSIS — Z79899 Other long term (current) drug therapy: Secondary | ICD-10-CM | POA: Insufficient documentation

## 2017-06-04 DIAGNOSIS — E1165 Type 2 diabetes mellitus with hyperglycemia: Secondary | ICD-10-CM | POA: Insufficient documentation

## 2017-06-04 DIAGNOSIS — Z8673 Personal history of transient ischemic attack (TIA), and cerebral infarction without residual deficits: Secondary | ICD-10-CM | POA: Insufficient documentation

## 2017-06-04 DIAGNOSIS — Z7902 Long term (current) use of antithrombotics/antiplatelets: Secondary | ICD-10-CM | POA: Insufficient documentation

## 2017-06-04 LAB — URINALYSIS, ROUTINE W REFLEX MICROSCOPIC
Bacteria, UA: NONE SEEN
Bilirubin Urine: NEGATIVE
KETONES UR: NEGATIVE mg/dL
LEUKOCYTES UA: NEGATIVE
Nitrite: NEGATIVE
PROTEIN: NEGATIVE mg/dL
Specific Gravity, Urine: 1.025 (ref 1.005–1.030)
pH: 5 (ref 5.0–8.0)

## 2017-06-04 LAB — I-STAT CG4 LACTIC ACID, ED: Lactic Acid, Venous: 1.47 mmol/L (ref 0.5–1.9)

## 2017-06-04 LAB — CBC
HCT: 31.2 % — ABNORMAL LOW (ref 36.0–46.0)
HEMOGLOBIN: 10.2 g/dL — AB (ref 12.0–15.0)
MCH: 25.4 pg — AB (ref 26.0–34.0)
MCHC: 32.7 g/dL (ref 30.0–36.0)
MCV: 77.8 fL — AB (ref 78.0–100.0)
Platelets: 204 10*3/uL (ref 150–400)
RBC: 4.01 MIL/uL (ref 3.87–5.11)
RDW: 15.6 % — ABNORMAL HIGH (ref 11.5–15.5)
WBC: 6.2 10*3/uL (ref 4.0–10.5)

## 2017-06-04 LAB — BASIC METABOLIC PANEL
Anion gap: 9 (ref 5–15)
BUN: 10 mg/dL (ref 6–20)
CO2: 27 mmol/L (ref 22–32)
Calcium: 9.9 mg/dL (ref 8.9–10.3)
Chloride: 98 mmol/L — ABNORMAL LOW (ref 101–111)
Creatinine, Ser: 0.52 mg/dL (ref 0.44–1.00)
GFR calc Af Amer: >60 mL/min (ref >60)
GFR calc non Af Amer: >60 mL/min (ref >60)
GLUCOSE: 511 mg/dL — AB (ref 65–99)
Potassium: 4.1 mmol/L (ref 3.5–5.1)
SODIUM: 134 mmol/L — AB (ref 135–145)

## 2017-06-04 LAB — WET PREP, GENITAL
Clue Cells Wet Prep HPF POC: NONE SEEN
Sperm: NONE SEEN
Trich, Wet Prep: NONE SEEN
YEAST WET PREP: NONE SEEN

## 2017-06-04 LAB — CBG MONITORING, ED: GLUCOSE-CAPILLARY: 253 mg/dL — AB (ref 65–99)

## 2017-06-04 MED ORDER — ACETAMINOPHEN 325 MG PO TABS
650.0000 mg | ORAL_TABLET | Freq: Once | ORAL | Status: AC
  Administered 2017-06-04: 650 mg via ORAL
  Filled 2017-06-04: qty 2

## 2017-06-04 MED ORDER — INSULIN GLARGINE 100 UNIT/ML ~~LOC~~ SOLN
5.0000 [IU] | Freq: Once | SUBCUTANEOUS | Status: AC
  Administered 2017-06-04: 5 [IU] via SUBCUTANEOUS
  Filled 2017-06-04: qty 0.05

## 2017-06-04 MED ORDER — MORPHINE SULFATE (PF) 4 MG/ML IV SOLN
2.0000 mg | Freq: Once | INTRAVENOUS | Status: AC
  Administered 2017-06-04: 2 mg via INTRAVENOUS
  Filled 2017-06-04: qty 1

## 2017-06-04 MED ORDER — SODIUM CHLORIDE 0.9 % IV BOLUS (SEPSIS)
1000.0000 mL | Freq: Once | INTRAVENOUS | Status: AC
  Administered 2017-06-04: 1000 mL via INTRAVENOUS

## 2017-06-04 NOTE — ED Triage Notes (Signed)
Lab called, had to throw out urine. Will need to collect another sample.

## 2017-06-04 NOTE — ED Notes (Signed)
Pt verbalized understanding of d/c instructions and has no further questions. Pt is stable, A&Ox4, VSS.  

## 2017-06-04 NOTE — ED Notes (Signed)
Pt able to ambulate w/ no issues.  

## 2017-06-04 NOTE — Discharge Instructions (Signed)
As we discussed, he needs to take her medications as directed. You need to restart your medications. It is very important that you begin taking them.   Call your primary care doctor and arrange for an appointment in the next 24-48 hours. Call and tell him you were seen in the emergency department and see them in their office. If you cannot get in with her primary care doctor, I have provided a referral to the [facility] clinic that you can use for primary care follow-up.  Return to the emergency department for any difficulty walking, worsening back pain, fever, numbness/weakness of her arms or legs, abdominal pain, chest pain, speech difficulty, numbness in your face, facial droop,  pain with urination, vaginal bleeding.
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
## 2017-06-05 LAB — GC/CHLAMYDIA PROBE AMP (~~LOC~~) NOT AT ARMC
CHLAMYDIA, DNA PROBE: NEGATIVE
NEISSERIA GONORRHEA: NEGATIVE

## 2017-06-08 ENCOUNTER — Encounter: Payer: Self-pay | Admitting: Internal Medicine

## 2017-06-13 ENCOUNTER — Encounter: Payer: Self-pay | Admitting: Internal Medicine

## 2017-06-17 ENCOUNTER — Emergency Department (HOSPITAL_COMMUNITY)
Admission: EM | Admit: 2017-06-17 | Discharge: 2017-06-17 | Disposition: A | Discharge status: Home / Self Care | Payer: Self-pay | Attending: Emergency Medicine | Admitting: Emergency Medicine

## 2017-06-17 ENCOUNTER — Encounter (HOSPITAL_COMMUNITY): Payer: Self-pay | Admitting: Emergency Medicine

## 2017-06-17 DIAGNOSIS — Z7984 Long term (current) use of oral hypoglycemic drugs: Secondary | ICD-10-CM | POA: Insufficient documentation

## 2017-06-17 DIAGNOSIS — E1165 Type 2 diabetes mellitus with hyperglycemia: Secondary | ICD-10-CM | POA: Insufficient documentation

## 2017-06-17 DIAGNOSIS — R51 Headache: Secondary | ICD-10-CM | POA: Insufficient documentation

## 2017-06-17 DIAGNOSIS — R519 Headache, unspecified: Secondary | ICD-10-CM

## 2017-06-17 DIAGNOSIS — Z8673 Personal history of transient ischemic attack (TIA), and cerebral infarction without residual deficits: Secondary | ICD-10-CM | POA: Insufficient documentation

## 2017-06-17 DIAGNOSIS — R739 Hyperglycemia, unspecified: Secondary | ICD-10-CM

## 2017-06-17 LAB — BASIC METABOLIC PANEL
Anion gap: 9 (ref 5–15)
BUN: <5 mg/dL — ABNORMAL LOW (ref 6–20)
CO2: 23 mmol/L (ref 22–32)
Calcium: 9.2 mg/dL (ref 8.9–10.3)
Chloride: 104 mmol/L (ref 101–111)
Creatinine, Ser: 0.38 mg/dL — ABNORMAL LOW (ref 0.44–1.00)
GFR calc Af Amer: >60 mL/min (ref >60)
GFR calc non Af Amer: >60 mL/min (ref >60)
Glucose, Bld: 287 mg/dL — ABNORMAL HIGH (ref 65–99)
Potassium: 3.6 mmol/L (ref 3.5–5.1)
Sodium: 136 mmol/L (ref 135–145)

## 2017-06-17 LAB — CBC WITH DIFFERENTIAL/PLATELET
Basophils Absolute: 0 10*3/uL (ref 0.0–0.1)
Basophils Relative: 1 %
Eosinophils Absolute: 0.2 10*3/uL (ref 0.0–0.7)
Eosinophils Relative: 4 %
HCT: 32.2 % — ABNORMAL LOW (ref 36.0–46.0)
Hemoglobin: 10.8 g/dL — ABNORMAL LOW (ref 12.0–15.0)
Lymphocytes Relative: 18 %
Lymphs Abs: 0.9 10*3/uL (ref 0.7–4.0)
MCH: 26 pg (ref 26.0–34.0)
MCHC: 33.5 g/dL (ref 30.0–36.0)
MCV: 77.4 fL — ABNORMAL LOW (ref 78.0–100.0)
Monocytes Absolute: 0.4 10*3/uL (ref 0.1–1.0)
Monocytes Relative: 9 %
Neutro Abs: 3.3 10*3/uL (ref 1.7–7.7)
Neutrophils Relative %: 68 %
Platelets: 169 10*3/uL (ref 150–400)
RBC: 4.16 MIL/uL (ref 3.87–5.11)
RDW: 15.6 % — ABNORMAL HIGH (ref 11.5–15.5)
WBC: 4.9 10*3/uL (ref 4.0–10.5)

## 2017-06-17 LAB — CBG MONITORING, ED
Glucose-Capillary: 296 mg/dL — ABNORMAL HIGH (ref 65–99)
Glucose-Capillary: 239 mg/dL — ABNORMAL HIGH (ref 65–99)

## 2017-06-17 MED ORDER — KETOROLAC TROMETHAMINE 30 MG/ML IJ SOLN
30.0000 mg | Freq: Once | INTRAMUSCULAR | Status: AC
  Administered 2017-06-17: 30 mg via INTRAVENOUS
  Filled 2017-06-17: qty 1

## 2017-06-17 MED ORDER — SODIUM CHLORIDE 0.9 % IV BOLUS (SEPSIS)
1000.0000 mL | Freq: Once | INTRAVENOUS | Status: AC
  Administered 2017-06-17: 1000 mL via INTRAVENOUS

## 2017-06-17 MED ORDER — METOCLOPRAMIDE HCL 5 MG/ML IJ SOLN
5.0000 mg | Freq: Once | INTRAMUSCULAR | Status: AC
Start: 2017-06-17 — End: 2017-06-17
  Administered 2017-06-17: 5 mg via INTRAVENOUS
  Filled 2017-06-17: qty 2

## 2017-06-17 MED ORDER — DIPHENHYDRAMINE HCL 50 MG/ML IJ SOLN
25.0000 mg | Freq: Once | INTRAMUSCULAR | Status: AC
  Administered 2017-06-17: 25 mg via INTRAVENOUS
  Filled 2017-06-17: qty 1
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
## 2017-06-17 NOTE — Discharge Instructions (Signed)
Please read attached information. If you experience any new or worsening signs or symptoms please return to the emergency room for evaluation. Please follow-up with your primary care provider or specialist as discussed.  °

## 2017-06-17 NOTE — ED Triage Notes (Signed)
Pt arrived via [facility]. C/o increased feeling of weakness and generally feeling sick. HX of DM and CVA. Pt c/o intermittent headache. Reports having increase in urination. CBG 362.  Alert  to person and place. No neural deficits reported. BP reported as 140/100, pulse 80, 97% on room air.

## 2017-06-19 DIAGNOSIS — R079 Chest pain, unspecified: Secondary | ICD-10-CM | POA: Insufficient documentation

## 2017-06-20 ENCOUNTER — Emergency Department (HOSPITAL_COMMUNITY)
Admission: EM | Admit: 2017-06-20 | Discharge: 2017-06-20 | Discharge status: Against Medical Advice | Payer: Self-pay | Attending: Emergency Medicine | Admitting: Emergency Medicine

## 2017-06-25 ENCOUNTER — Ambulatory Visit: Payer: Self-pay

## 2017-06-25 ENCOUNTER — Encounter: Payer: Self-pay | Admitting: Internal Medicine

## 2017-07-22 ENCOUNTER — Observation Stay (HOSPITAL_COMMUNITY)
Admission: EM | Admit: 2017-07-22 | Discharge: 2017-07-22 | Disposition: A | Discharge status: Home / Self Care | Payer: Self-pay | Attending: Oncology | Admitting: Oncology

## 2017-07-22 ENCOUNTER — Emergency Department (HOSPITAL_COMMUNITY): Payer: Self-pay

## 2017-07-22 ENCOUNTER — Encounter (HOSPITAL_COMMUNITY): Payer: Self-pay | Admitting: Emergency Medicine

## 2017-07-22 DIAGNOSIS — I7771 Dissection of carotid artery: Secondary | ICD-10-CM | POA: Insufficient documentation

## 2017-07-22 DIAGNOSIS — Z91199 Patient's noncompliance with other medical treatment and regimen due to unspecified reason: Secondary | ICD-10-CM

## 2017-07-22 DIAGNOSIS — Z8673 Personal history of transient ischemic attack (TIA), and cerebral infarction without residual deficits: Secondary | ICD-10-CM

## 2017-07-22 DIAGNOSIS — D509 Iron deficiency anemia, unspecified: Secondary | ICD-10-CM

## 2017-07-22 DIAGNOSIS — O99019 Anemia complicating pregnancy, unspecified trimester: Secondary | ICD-10-CM | POA: Diagnosis present

## 2017-07-22 DIAGNOSIS — R739 Hyperglycemia, unspecified: Secondary | ICD-10-CM

## 2017-07-22 DIAGNOSIS — R0789 Other chest pain: Principal | ICD-10-CM | POA: Insufficient documentation

## 2017-07-22 DIAGNOSIS — R079 Chest pain, unspecified: Secondary | ICD-10-CM | POA: Diagnosis present

## 2017-07-22 DIAGNOSIS — E669 Obesity, unspecified: Secondary | ICD-10-CM

## 2017-07-22 DIAGNOSIS — R55 Syncope and collapse: Secondary | ICD-10-CM

## 2017-07-22 DIAGNOSIS — Z9119 Patient's noncompliance with other medical treatment and regimen: Secondary | ICD-10-CM

## 2017-07-22 DIAGNOSIS — E119 Type 2 diabetes mellitus without complications: Secondary | ICD-10-CM | POA: Insufficient documentation

## 2017-07-22 DIAGNOSIS — Z9114 Patient's other noncompliance with medication regimen: Secondary | ICD-10-CM | POA: Insufficient documentation

## 2017-07-22 HISTORY — DX: Patient's other noncompliance with medication regimen: Z91.14

## 2017-07-22 HISTORY — DX: Patient's other noncompliance with medication regimen for other reason: Z91.148

## 2017-07-22 LAB — URINALYSIS, ROUTINE W REFLEX MICROSCOPIC
Bilirubin Urine: NEGATIVE
Glucose, UA: >=500 mg/dL — AB
Hgb urine dipstick: NEGATIVE
KETONES UR: NEGATIVE mg/dL
Nitrite: NEGATIVE
PROTEIN: NEGATIVE mg/dL
Specific Gravity, Urine: 1.027 (ref 1.005–1.030)
pH: 5 (ref 5.0–8.0)

## 2017-07-22 LAB — BASIC METABOLIC PANEL
Anion gap: 10 (ref 5–15)
BUN: 9 mg/dL (ref 6–20)
CHLORIDE: 101 mmol/L (ref 101–111)
CO2: 20 mmol/L — AB (ref 22–32)
CREATININE: 0.57 mg/dL (ref 0.44–1.00)
Calcium: 8.9 mg/dL (ref 8.9–10.3)
GFR calc non Af Amer: >60 mL/min (ref >60)
GLUCOSE: 493 mg/dL — AB (ref 65–99)
Potassium: 3.6 mmol/L (ref 3.5–5.1)
Sodium: 131 mmol/L — ABNORMAL LOW (ref 135–145)

## 2017-07-22 LAB — CBC
HCT: 29.9 % — ABNORMAL LOW (ref 36.0–46.0)
Hemoglobin: 10.3 g/dL — ABNORMAL LOW (ref 12.0–15.0)
MCH: 26.8 pg (ref 26.0–34.0)
MCHC: 34.4 g/dL (ref 30.0–36.0)
MCV: 77.7 fL — AB (ref 78.0–100.0)
PLATELETS: 177 10*3/uL (ref 150–400)
RBC: 3.85 MIL/uL — AB (ref 3.87–5.11)
RDW: 16 % — ABNORMAL HIGH (ref 11.5–15.5)
WBC: 6.2 10*3/uL (ref 4.0–10.5)

## 2017-07-22 LAB — DIFFERENTIAL
BASOS PCT: 0 %
Basophils Absolute: 0 10*3/uL (ref 0.0–0.1)
EOS ABS: 0.2 10*3/uL (ref 0.0–0.7)
EOS PCT: 3 %
Lymphocytes Relative: 17 %
Lymphs Abs: 1 10*3/uL (ref 0.7–4.0)
MONO ABS: 0.5 10*3/uL (ref 0.1–1.0)
Monocytes Relative: 8 %
Neutro Abs: 4.6 10*3/uL (ref 1.7–7.7)
Neutrophils Relative %: 72 %

## 2017-07-22 LAB — CBG MONITORING, ED
Glucose-Capillary: 500 mg/dL — ABNORMAL HIGH (ref 65–99)
Glucose-Capillary: 366 mg/dL — ABNORMAL HIGH (ref 65–99)

## 2017-07-22 LAB — GLUCOSE, CAPILLARY
Glucose-Capillary: 373 mg/dL — ABNORMAL HIGH (ref 65–99)
Glucose-Capillary: 366 mg/dL — ABNORMAL HIGH (ref 65–99)

## 2017-07-22 LAB — RAPID URINE DRUG SCREEN, HOSP PERFORMED
Amphetamines: NOT DETECTED
BARBITURATES: NOT DETECTED
BENZODIAZEPINES: NOT DETECTED
COCAINE: NOT DETECTED
Opiates: NOT DETECTED
TETRAHYDROCANNABINOL: NOT DETECTED

## 2017-07-22 LAB — I-STAT BETA HCG BLOOD, ED (MC, WL, AP ONLY): I-stat hCG, quantitative: <5 m[IU]/mL (ref <5)

## 2017-07-22 LAB — I-STAT TROPONIN, ED: TROPONIN I, POC: 0 ng/mL (ref 0.00–0.08)

## 2017-07-22 LAB — HEMOGLOBIN A1C
HEMOGLOBIN A1C: 9 % — AB (ref 4.8–5.6)
Mean Plasma Glucose: 211.6 mg/dL

## 2017-07-22 LAB — D-DIMER, QUANTITATIVE (NOT AT ARMC): D DIMER QUANT: 0.44 ug{FEU}/mL (ref 0.00–0.50)

## 2017-07-22 MED ORDER — KETOROLAC TROMETHAMINE 30 MG/ML IJ SOLN
30.0000 mg | Freq: Once | INTRAMUSCULAR | Status: AC
  Administered 2017-07-22: 30 mg via INTRAVENOUS
  Filled 2017-07-22: qty 1

## 2017-07-22 MED ORDER — PANTOPRAZOLE SODIUM 40 MG PO TBEC: 40.0000 mg | DELAYED_RELEASE_TABLET | Freq: Every day | ORAL | 0 refills | Status: DC

## 2017-07-22 MED ORDER — INFLUENZA VAC SPLIT QUAD 0.5 ML IM SUSY: 0.5000 mL | PREFILLED_SYRINGE | INTRAMUSCULAR | Status: DC

## 2017-07-22 MED ORDER — DICLOFENAC SODIUM 1 % TD GEL: 2.0000 g | Freq: Four times a day (QID) | TRANSDERMAL | 0 refills | Status: DC | PRN

## 2017-07-22 MED ORDER — ONDANSETRON HCL 4 MG/2ML IJ SOLN: 4.0000 mg | Freq: Four times a day (QID) | INTRAMUSCULAR | Status: DC | PRN

## 2017-07-22 MED ORDER — GI COCKTAIL ~~LOC~~: 30.0000 mL | Freq: Four times a day (QID) | ORAL | Status: DC | PRN

## 2017-07-22 MED ORDER — ASPIRIN EC 81 MG PO TBEC
81.0000 mg | DELAYED_RELEASE_TABLET | Freq: Every day | ORAL | Status: DC
  Administered 2017-07-22: 81 mg via ORAL
  Filled 2017-07-22: qty 1

## 2017-07-22 MED ORDER — CLOPIDOGREL BISULFATE 75 MG PO TABS
75.0000 mg | ORAL_TABLET | Freq: Every day | ORAL | Status: DC
  Administered 2017-07-22: 75 mg via ORAL
  Filled 2017-07-22: qty 1

## 2017-07-22 MED ORDER — GI COCKTAIL ~~LOC~~
30.0000 mL | Freq: Once | ORAL | Status: DC
  Filled 2017-07-22: qty 30

## 2017-07-22 MED ORDER — DICLOFENAC SODIUM 1 % TD GEL
2.0000 g | Freq: Four times a day (QID) | TRANSDERMAL | Status: DC | PRN
  Filled 2017-07-22: qty 100

## 2017-07-22 MED ORDER — INSULIN ASPART 100 UNIT/ML ~~LOC~~ SOLN
10.0000 [IU] | Freq: Once | SUBCUTANEOUS | Status: AC
  Administered 2017-07-22: 10 [IU] via INTRAVENOUS
  Filled 2017-07-22: qty 1

## 2017-07-22 MED ORDER — ATORVASTATIN CALCIUM 40 MG PO TABS: 40.0000 mg | ORAL_TABLET | Freq: Every day | ORAL | Status: DC

## 2017-07-22 MED ORDER — INSULIN ASPART 100 UNIT/ML ~~LOC~~ SOLN
0.0000 [IU] | Freq: Three times a day (TID) | SUBCUTANEOUS | Status: DC
  Administered 2017-07-22 (×2): 9 [IU] via SUBCUTANEOUS

## 2017-07-22 MED ORDER — ENOXAPARIN SODIUM 40 MG/0.4ML ~~LOC~~ SOLN
40.0000 mg | SUBCUTANEOUS | Status: DC
  Administered 2017-07-22: 40 mg via SUBCUTANEOUS
  Filled 2017-07-22: qty 0.4

## 2017-07-22 MED ORDER — ACETAMINOPHEN 325 MG PO TABS: 650.0000 mg | ORAL_TABLET | ORAL | Status: DC | PRN

## 2017-07-22 NOTE — ED Notes (Signed)
ED Provider at bedside. 

## 2017-07-22 NOTE — Plan of Care (Signed)
Problem: Pain Managment: Goal: General experience of comfort will improve Outcome: Progressing No active chest pains   Problem: Tissue Perfusion: Goal: Risk factors for ineffective tissue perfusion will decrease Outcome: Progressing Oxygen saturation remains in the high 90's on room air
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
## 2017-07-22 NOTE — Progress Notes (Signed)
   Subjective:  Patient was evaluated this morning on rounds. She states she has a headache.  She was not very interactive during the interview.  She was reevaluated in the afternoon and states her chest pain has resolved and she is feeling back to her baseline health.  Objective:  Vital signs in last 24 hours: Vitals:   07/22/17 0630 07/22/17 0645 07/22/17 0813 07/22/17 1208  BP: (!) 121/59 (!) 108/56 138/84 136/78  Pulse: 81 76 86 88  Resp: (!) 21 14 16 16   Temp:   98.4 F (36.9 C) 98.2 F (36.8 C)  TempSrc:   Oral Oral  SpO2: 99% 98% 100% 100%  Weight:   201 lb 3 oz (91.3 kg)   Height:   5\' 1"  (1.549 m)    Physical Exam  Constitutional: She is well-developed, well-nourished, and in no distress.  Cardiovascular: Normal rate, regular rhythm and normal heart sounds.  Exam reveals no gallop and no friction rub.   No murmur heard. Pulmonary/Chest: Effort normal and breath sounds normal. No respiratory distress. She has no wheezes. She has no rales.  Skin: Skin is warm and dry.     Assessment/Plan:  Principal Problem:   Chest pain Active Problems:   History of stroke   Type 2 diabetes mellitus (HCC)   Microcytic anemia   Non-compliance   Obesity (BMI 30-39.9)  Atypical chest pain.   Patient presented with atypical chest soreness going on for a few months that became worse after eating her dinner. On exam patient had chest pain on palpation.  There maybe a component of costochondritis causing her pain, especially since it was relieved with toradol.  She also stated that she had chest pain with eructation after eating dinner and so GERD may have also led to her chest pain as well.  She feels her symptoms are temporally related to taking her statin medications.  We will stop her statin drug temporarily and give her a trial of a PPI. -PPI -Ibuprofen -stopping statin temporarily, needs follow up with PCP  Uncontrolled Type 2 diabetes  Hemoglobin A1c during this admission and  was 9.0. On metformin 500 mg QD and glipizide 10 mg daily at home. However, patient has not been taking her home meds for the past 3-4 months. 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 MCV 77.7. Unclear etiology, low suspicion for iron deficiency anemias as ferritin normal in 02/2017. Negative pregnancy test. ?Beta thalassemia given Mediterranean origin [place]). Differential diagnosis also includes anemia of chronic disease and sideroblastic anemia.  - Outpatient workup   Dispo: Anticipated discharge pending clinical improvement.   Kalman Shan [place], DO 07/22/2017, 1:30 PM Pager: [phone]

## 2017-07-22 NOTE — Progress Notes (Signed)
Patient admitted to [facility] from emergency room, VSS.  Patient denies active chest pain at this time.

## 2017-07-22 NOTE — ED Notes (Signed)
Patient transported to X-ray 

## 2017-07-22 NOTE — ED Notes (Signed)
Attempted to call report. RN not available at this time. Will try again.

## 2017-07-22 NOTE — ED Notes (Signed)
Pt was able to ambulate to restroom without any assistance.
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
## 2017-07-22 NOTE — Progress Notes (Signed)
Discharge instructions reviewed with patient, questions answered, verbalized understanding.  Patient to be taken home by mother.
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
## 2017-07-25 NOTE — Discharge Summary (Signed)
Medicine attending discharge note: 31 year old woman with poorly controlled type 2 diabetes and atypical cerebrovascular disease status post a right ICA stroke in May 2018 felt to be due to a subacute right internal carotid artery dissection.  She presented with atypical chest soreness been occurring on and off over the last few months.  Pain coincidentally occurs after she takes her evening medications.  At least one episode occurred after an evening meal and was associated with belching.  Not exertional.  Nonradiating.  Relieved with nonsteroidals given in the hospital. She had chest wall tenderness over the sternum.  She was normotensive.  Afebrile.  Oxygen saturation 100% on room air. Cardiogram with sinus rhythm, T wave inversions unchanged from prior tracings.  No acute ischemic change or arrhythmia.  No evidence for pericarditis.  No elevation of cardiac enzymes.  Chest x-ray without infiltrate or effusion. She was not taking her insulin and initial blood sugar was 493.  Bicarbonate 20.  Anion gap 10. She had relief of her symptoms with Toradol.  We also felt she likely had a component of reflux esophagitis.  She was observed over the course of the day and remained stable.  Blood sugars trending down back on home regimen.  Disposition: Condition stable at time of discharge Follow-up in our general medicine clinic There were no complications

## 2017-07-28 NOTE — Discharge Summary (Signed)
Name: Joann Meyers MRN: [id number] DOB: 1985-10-30 31 y.o. PCP: Lorella Nimrod, MD  Date of Admission: 07/22/2017  3:07 AM Date of Discharge: 07/22/2017 Attending Physician: Dr. Beryle Beams  Discharge Diagnosis: 1. Atypical chest pain  Principal Problem:   Chest pain Active Problems:   History of stroke   Type 2 diabetes mellitus (HCC)   Microcytic anemia   Non-compliance   Obesity (BMI 30-39.9)   Discharge Medications: Allergies as of 07/22/2017      Reactions   Shellfish Allergy Anaphylaxis   Tomato Hives      Medication List    TAKE these medications   aspirin 325 MG EC tablet Take 1 tablet (325 mg total) by mouth daily.   atorvastatin 40 MG tablet Commonly known as:  LIPITOR Take 1 tablet (40 mg total) by mouth daily at 6 PM.   clopidogrel 75 MG tablet Commonly known as:  PLAVIX Take 1 tablet (75 mg total) by mouth daily.   diclofenac sodium 1 % Gel Commonly known as:  VOLTAREN Apply 2 g topically 4 (four) times daily as needed (pain).   glipiZIDE 10 MG tablet Commonly known as:  GLUCOTROL Take 1 tablet (10 mg total) by mouth daily before breakfast.   metFORMIN 500 MG tablet Commonly known as:  GLUCOPHAGE Take 1 tablet (500 mg total) by mouth daily with breakfast.   pantoprazole 40 MG tablet Commonly known as:  PROTONIX Take 1 tablet (40 mg total) by mouth daily. What changed:  medication strength  how much to take       Disposition and follow-up:   Joann Meyers was discharged from [facility] in good condition.  At the hospital follow up visit please address:  1.  Uncontrolled type II diabetes, patient non-compliant with medications.  Assess barriers 2.  Microcytic anemia: consider additional workup for Beta thalassemia, anemia of chronic disease and sideroblastic anemia.  Low suspicion for iron deficiency anemia since ferritin was normal 02/2017.  2.  Labs / imaging needed at time of follow-up: none  3.  Pending  labs/ test needing follow-up: none  Follow-up Appointments:   Hospital Course by problem list: Principal Problem:   Chest pain Active Problems:   History of stroke   Type 2 diabetes mellitus (HCC)   Microcytic anemia   Non-compliance   Obesity (BMI 30-39.9)   Atypical chest pain.   Patient presented with atypical chest soreness going on for a few months that became worse after eating her dinner. On exam patient had chest pain on palpation.  There maybe a component of costochondritis causing her pain, especially since it was relieved with toradol.  She also stated that she had chest pain with eructation after eating dinner and so GERD may have also led to her chest pain as well.  She also feels her symptoms are temporally related to taking her statin medications.   On day of discharge patient states that her chest pain had improved. She was discharged with a PPI and Voltaren gel was told to follow-up with her PCP.  Uncontrolled Type 2 diabetes Hemoglobin A1c during this admission and was 9.0. On metformin 500 mg QD and glipizide 10 mg daily at home. However, patient has not been taking her home meds for the past 3-4 months.  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  MCV 77.7. Unclear etiology, low suspicion for iron deficiency anemias as ferritin normal in 02/2017. Negative pregnancy test. ?Beta thalassemia given Mediterranean origin [place]). Differential diagnosis also includes anemia of chronic disease and sideroblastic anemia.    Discharge Vitals:   BP 136/78 (BP Location: Right Arm)   Pulse 88   Temp 98.2 F (36.8 C) (Oral)   Resp 16   Ht 5\' 1"  (1.549 m)   Wt 201 lb 3 oz (91.3 kg)   LMP 07/03/2017   SpO2 100%   BMI 38.01 kg/m   Pertinent Labs, Studies, and Procedures:  EKG  Discharge  Instructions: Discharge Instructions    Diet - low sodium heart healthy    Complete by:  As directed    Discharge instructions    Complete by:  As directed    Joann Meyers please start taking protonix 40mg  daily to help with acid reflux.   You may hold your atorvastatin (cholesterol medication) to see if this helps relieve your chest pain.  You may also place voltaren gel on your chest to see if this provides you relief as well  Please resume all your other medications and follow up in the Internal medicine clinic this week   Increase activity slowly    Complete by:  As directed       Signed: Valinda Party, DO 07/28/2017, 2:50 PM   Pager: [phone]

## 2017-09-24 ENCOUNTER — Other Ambulatory Visit: Payer: Self-pay

## 2017-09-24 ENCOUNTER — Emergency Department (HOSPITAL_COMMUNITY): Payer: Self-pay

## 2017-09-24 ENCOUNTER — Encounter (HOSPITAL_COMMUNITY): Payer: Self-pay | Admitting: Emergency Medicine

## 2017-09-24 ENCOUNTER — Emergency Department (HOSPITAL_COMMUNITY)
Admission: EM | Admit: 2017-09-24 | Discharge: 2017-09-24 | Disposition: A | Discharge status: Home / Self Care | Payer: Self-pay | Attending: Emergency Medicine | Admitting: Emergency Medicine

## 2017-09-24 DIAGNOSIS — Z8673 Personal history of transient ischemic attack (TIA), and cerebral infarction without residual deficits: Secondary | ICD-10-CM | POA: Insufficient documentation

## 2017-09-24 DIAGNOSIS — Z91013 Allergy to seafood: Secondary | ICD-10-CM | POA: Insufficient documentation

## 2017-09-24 DIAGNOSIS — Z7984 Long term (current) use of oral hypoglycemic drugs: Secondary | ICD-10-CM | POA: Insufficient documentation

## 2017-09-24 DIAGNOSIS — N3000 Acute cystitis without hematuria: Secondary | ICD-10-CM | POA: Insufficient documentation

## 2017-09-24 DIAGNOSIS — E119 Type 2 diabetes mellitus without complications: Secondary | ICD-10-CM | POA: Insufficient documentation

## 2017-09-24 DIAGNOSIS — Z7902 Long term (current) use of antithrombotics/antiplatelets: Secondary | ICD-10-CM | POA: Insufficient documentation

## 2017-09-24 DIAGNOSIS — R112 Nausea with vomiting, unspecified: Secondary | ICD-10-CM | POA: Insufficient documentation

## 2017-09-24 DIAGNOSIS — R531 Weakness: Secondary | ICD-10-CM | POA: Insufficient documentation

## 2017-09-24 DIAGNOSIS — R1012 Left upper quadrant pain: Secondary | ICD-10-CM | POA: Insufficient documentation

## 2017-09-24 LAB — CBC WITH DIFFERENTIAL/PLATELET
BASOS ABS: 0 10*3/uL (ref 0.0–0.1)
Basophils Relative: 0 %
EOS ABS: 0.1 10*3/uL (ref 0.0–0.7)
Eosinophils Relative: 1 %
HCT: 35.8 % — ABNORMAL LOW (ref 36.0–46.0)
HEMOGLOBIN: 12 g/dL (ref 12.0–15.0)
LYMPHS ABS: 0.8 10*3/uL (ref 0.7–4.0)
Lymphocytes Relative: 9 %
MCH: 26.8 pg (ref 26.0–34.0)
MCHC: 33.5 g/dL (ref 30.0–36.0)
MCV: 79.9 fL (ref 78.0–100.0)
Monocytes Absolute: 0.4 10*3/uL (ref 0.1–1.0)
Monocytes Relative: 5 %
NEUTROS PCT: 85 %
Neutro Abs: 7.3 10*3/uL (ref 1.7–7.7)
PLATELETS: 175 10*3/uL (ref 150–400)
RBC: 4.48 MIL/uL (ref 3.87–5.11)
RDW: 15.6 % — AB (ref 11.5–15.5)
WBC: 8.6 10*3/uL (ref 4.0–10.5)

## 2017-09-24 LAB — COMPREHENSIVE METABOLIC PANEL
ALBUMIN: 4.1 g/dL (ref 3.5–5.0)
ALK PHOS: 87 U/L (ref 38–126)
ALT: 17 U/L (ref 14–54)
ANION GAP: 9 (ref 5–15)
AST: 22 U/L (ref 15–41)
BUN: 9 mg/dL (ref 6–20)
CALCIUM: 9.1 mg/dL (ref 8.9–10.3)
CO2: 23 mmol/L (ref 22–32)
Chloride: 102 mmol/L (ref 101–111)
Creatinine, Ser: 0.47 mg/dL (ref 0.44–1.00)
GFR calc non Af Amer: >60 mL/min (ref >60)
Glucose, Bld: 304 mg/dL — ABNORMAL HIGH (ref 65–99)
POTASSIUM: 3.7 mmol/L (ref 3.5–5.1)
SODIUM: 134 mmol/L — AB (ref 135–145)
Total Bilirubin: 0.9 mg/dL (ref 0.3–1.2)
Total Protein: 6.9 g/dL (ref 6.5–8.1)

## 2017-09-24 LAB — URINALYSIS, ROUTINE W REFLEX MICROSCOPIC
BILIRUBIN URINE: NEGATIVE
KETONES UR: 20 mg/dL — AB
NITRITE: NEGATIVE
PH: 5 (ref 5.0–8.0)
PROTEIN: 30 mg/dL — AB
Specific Gravity, Urine: 1.028 (ref 1.005–1.030)

## 2017-09-24 LAB — I-STAT CHEM 8, ED
BUN: 9 mg/dL (ref 6–20)
CALCIUM ION: 1.2 mmol/L (ref 1.15–1.40)
CREATININE: 0.3 mg/dL — AB (ref 0.44–1.00)
Chloride: 103 mmol/L (ref 101–111)
Glucose, Bld: 318 mg/dL — ABNORMAL HIGH (ref 65–99)
HCT: 37 % (ref 36.0–46.0)
Hemoglobin: 12.6 g/dL (ref 12.0–15.0)
Potassium: 3.8 mmol/L (ref 3.5–5.1)
SODIUM: 138 mmol/L (ref 135–145)
TCO2: 25 mmol/L (ref 22–32)

## 2017-09-24 LAB — I-STAT BETA HCG BLOOD, ED (MC, WL, AP ONLY): I-stat hCG, quantitative: <5 m[IU]/mL (ref <5)

## 2017-09-24 LAB — LIPASE, BLOOD: Lipase: 22 U/L (ref 11–51)

## 2017-09-24 LAB — AMMONIA: Ammonia: 22 umol/L (ref 9–35)

## 2017-09-24 LAB — I-STAT CG4 LACTIC ACID, ED: Lactic Acid, Venous: 1.64 mmol/L (ref 0.5–1.9)

## 2017-09-24 LAB — CBG MONITORING, ED: GLUCOSE-CAPILLARY: 268 mg/dL — AB (ref 65–99)

## 2017-09-24 MED ORDER — SODIUM CHLORIDE 0.9 % IV BOLUS (SEPSIS)
1000.0000 mL | Freq: Once | INTRAVENOUS | Status: AC
  Administered 2017-09-24: 1000 mL via INTRAVENOUS

## 2017-09-24 MED ORDER — CEPHALEXIN 500 MG PO CAPS: ORAL_CAPSULE | ORAL | 0 refills | Status: DC

## 2017-09-24 MED ORDER — IOPAMIDOL (ISOVUE-300) INJECTION 61%
INTRAVENOUS | Status: AC
  Administered 2017-09-24: 100 mL
  Filled 2017-09-24: qty 100

## 2017-09-24 MED ORDER — ONDANSETRON HCL 4 MG/2ML IJ SOLN
4.0000 mg | Freq: Once | INTRAMUSCULAR | Status: AC
  Administered 2017-09-24: 4 mg via INTRAVENOUS
  Filled 2017-09-24: qty 2

## 2017-09-24 MED ORDER — HYDROMORPHONE HCL 1 MG/ML IJ SOLN
0.5000 mg | Freq: Once | INTRAMUSCULAR | Status: AC
  Administered 2017-09-24: 0.5 mg via INTRAVENOUS
  Filled 2017-09-24: qty 1

## 2017-09-24 MED ORDER — ONDANSETRON 4 MG PO TBDP
4.0000 mg | ORAL_TABLET | Freq: Once | ORAL | Status: AC
  Administered 2017-09-24: 4 mg via ORAL
  Filled 2017-09-24: qty 1

## 2017-09-24 MED ORDER — DEXTROSE 5 % IV SOLN
1.0000 g | Freq: Once | INTRAVENOUS | Status: AC
  Administered 2017-09-24: 1 g via INTRAVENOUS
  Filled 2017-09-24: qty 10

## 2017-09-24 MED ORDER — SODIUM CHLORIDE 0.9 % IV SOLN: 1000.0000 mL | Freq: Once | INTRAVENOUS | Status: DC

## 2017-09-24 NOTE — Discharge Instructions (Signed)
Contact a health care provider if: You continue to have fainting spells despite treatment. You faint more often despite treatment. You lose consciousness for more than a few minutes. You faint during or after exercising or after being startled. You have twitching or jerky movements for longer than a few seconds during a fainting spell. You have an episode of twitching or jerky movements without fainting. Get help right away if: A fainting spell leads to an injury or bleeding. You have new symptoms that occur with the fainting spells, such as: Shortness of breath. Chest pain. Irregular heartbeat. You twitch or make jerky movements for more than 5 minutes. You twitch or make jerky movements during more than one fainting spell.

## 2017-09-24 NOTE — ED Notes (Signed)
Pt given ginger ale. Tolerating well.
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
## 2017-09-24 NOTE — ED Notes (Signed)
Patient transported to CT 

## 2017-09-24 NOTE — ED Notes (Signed)
Pt insisted on walking to the bathroom, she unplugged everything and then walked to the bathroom with no problem on her own.

## 2017-09-27 ENCOUNTER — Encounter (HOSPITAL_COMMUNITY): Payer: Self-pay

## 2017-09-27 ENCOUNTER — Emergency Department (HOSPITAL_COMMUNITY)
Admission: EM | Admit: 2017-09-27 | Discharge: 2017-09-27 | Disposition: A | Discharge status: Home / Self Care | Payer: Self-pay | Attending: Emergency Medicine | Admitting: Emergency Medicine

## 2017-09-27 DIAGNOSIS — R002 Palpitations: Secondary | ICD-10-CM | POA: Insufficient documentation

## 2017-09-27 DIAGNOSIS — I2510 Atherosclerotic heart disease of native coronary artery without angina pectoris: Secondary | ICD-10-CM | POA: Insufficient documentation

## 2017-09-27 DIAGNOSIS — R55 Syncope and collapse: Secondary | ICD-10-CM | POA: Insufficient documentation

## 2017-09-27 DIAGNOSIS — E119 Type 2 diabetes mellitus without complications: Secondary | ICD-10-CM | POA: Insufficient documentation

## 2017-09-27 LAB — CBG MONITORING, ED: GLUCOSE-CAPILLARY: 333 mg/dL — AB (ref 65–99)

## 2017-09-27 LAB — URINALYSIS, ROUTINE W REFLEX MICROSCOPIC
BILIRUBIN URINE: NEGATIVE
KETONES UR: NEGATIVE mg/dL
NITRITE: NEGATIVE
PH: 6 (ref 5.0–8.0)
PROTEIN: 30 mg/dL — AB
Specific Gravity, Urine: 1.026 (ref 1.005–1.030)
WBC, UA: NONE SEEN WBC/hpf (ref 0–5)

## 2017-09-27 LAB — BASIC METABOLIC PANEL
ANION GAP: 7 (ref 5–15)
BUN: 9 mg/dL (ref 6–20)
CHLORIDE: 104 mmol/L (ref 101–111)
CO2: 23 mmol/L (ref 22–32)
Calcium: 9 mg/dL (ref 8.9–10.3)
Creatinine, Ser: 0.42 mg/dL — ABNORMAL LOW (ref 0.44–1.00)
Glucose, Bld: 349 mg/dL — ABNORMAL HIGH (ref 65–99)
POTASSIUM: 3.3 mmol/L — AB (ref 3.5–5.1)
SODIUM: 134 mmol/L — AB (ref 135–145)

## 2017-09-27 LAB — CBC
HEMATOCRIT: 30.5 % — AB (ref 36.0–46.0)
Hemoglobin: 10.3 g/dL — ABNORMAL LOW (ref 12.0–15.0)
MCH: 26.6 pg (ref 26.0–34.0)
MCHC: 33.8 g/dL (ref 30.0–36.0)
MCV: 78.8 fL (ref 78.0–100.0)
PLATELETS: 182 10*3/uL (ref 150–400)
RBC: 3.87 MIL/uL (ref 3.87–5.11)
RDW: 15 % (ref 11.5–15.5)
WBC: 5.6 10*3/uL (ref 4.0–10.5)

## 2017-09-27 MED ORDER — ACETAMINOPHEN 325 MG PO TABS
650.0000 mg | ORAL_TABLET | Freq: Once | ORAL | Status: AC
  Administered 2017-09-27: 650 mg via ORAL
  Filled 2017-09-27: qty 2

## 2017-09-27 MED ORDER — POTASSIUM CHLORIDE CRYS ER 20 MEQ PO TBCR
40.0000 meq | EXTENDED_RELEASE_TABLET | Freq: Once | ORAL | Status: AC
  Administered 2017-09-27: 40 meq via ORAL
  Filled 2017-09-27: qty 2

## 2017-09-27 NOTE — ED Notes (Signed)
Pt ambulatory to restroom for UA sample. Steady gait noted. No complaints of dizziness, SOB or weakness.
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
## 2017-09-27 NOTE — Discharge Instructions (Signed)
See your primary care doctor for further evaluation.  Return to the ER if you have repeat episode or faint. Hydrate well and take your diabetes medications.

## 2017-09-27 NOTE — ED Triage Notes (Signed)
Pt presents with near syncopal episode while at work at [employer]. Pt reports she has been short of breath but "drank 2 bottles of water and it went away".  Pt reports she began to have mid-sternal chest pain that is now resolved.  Pt received 324mg  ASA PTA and CBG: 426.

## 2017-10-05 ENCOUNTER — Encounter (HOSPITAL_COMMUNITY): Payer: Self-pay | Admitting: *Deleted

## 2017-10-05 ENCOUNTER — Other Ambulatory Visit: Payer: Self-pay

## 2017-10-05 ENCOUNTER — Emergency Department (HOSPITAL_COMMUNITY)
Admission: EM | Admit: 2017-10-05 | Discharge: 2017-10-06 | Disposition: A | Discharge status: Home / Self Care | Payer: Self-pay | Attending: Emergency Medicine | Admitting: Emergency Medicine

## 2017-10-05 DIAGNOSIS — E669 Obesity, unspecified: Secondary | ICD-10-CM

## 2017-10-05 DIAGNOSIS — E119 Type 2 diabetes mellitus without complications: Secondary | ICD-10-CM | POA: Insufficient documentation

## 2017-10-05 DIAGNOSIS — Z8673 Personal history of transient ischemic attack (TIA), and cerebral infarction without residual deficits: Secondary | ICD-10-CM | POA: Insufficient documentation

## 2017-10-05 DIAGNOSIS — G43009 Migraine without aura, not intractable, without status migrainosus: Secondary | ICD-10-CM

## 2017-10-05 DIAGNOSIS — G43909 Migraine, unspecified, not intractable, without status migrainosus: Secondary | ICD-10-CM | POA: Insufficient documentation

## 2017-10-05 DIAGNOSIS — R202 Paresthesia of skin: Secondary | ICD-10-CM | POA: Insufficient documentation

## 2017-10-05 DIAGNOSIS — E1169 Type 2 diabetes mellitus with other specified complication: Secondary | ICD-10-CM

## 2017-10-05 DIAGNOSIS — Z862 Personal history of diseases of the blood and blood-forming organs and certain disorders involving the immune mechanism: Secondary | ICD-10-CM | POA: Insufficient documentation

## 2017-10-05 HISTORY — DX: Unspecified convulsions: R56.9

## 2017-10-05 MED ORDER — METOCLOPRAMIDE HCL 5 MG/ML IJ SOLN
10.0000 mg | Freq: Once | INTRAMUSCULAR | Status: AC
  Administered 2017-10-06: 10 mg via INTRAVENOUS
  Filled 2017-10-05: qty 2

## 2017-10-05 MED ORDER — SODIUM CHLORIDE 0.9 % IV BOLUS (SEPSIS)
1000.0000 mL | Freq: Once | INTRAVENOUS | Status: AC
  Administered 2017-10-06: 1000 mL via INTRAVENOUS

## 2017-10-05 MED ORDER — DIPHENHYDRAMINE HCL 50 MG/ML IJ SOLN
25.0000 mg | Freq: Once | INTRAMUSCULAR | Status: AC
  Administered 2017-10-06: 25 mg via INTRAVENOUS
  Filled 2017-10-05: qty 1

## 2017-10-05 NOTE — ED Notes (Signed)
Pt states she is here due to tingling and numbness on the left side and headache. States she had a stroke in June or July and feels the same as when the stroke happened. Reports she is prescribed plavix but has been out for at least 3 months.

## 2017-10-05 NOTE — ED Notes (Signed)
Provider made aware of pt. Reporting left sided tingling and loss in sensation. Will continue to monitor.

## 2017-10-05 NOTE — ED Triage Notes (Signed)
The pt has seizures  And she has been out of her seizxure meds for over a month  She has a headache before she has any seizure  Her last seizure was in June  lmpdec 10th
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
## 2017-10-05 NOTE — ED Notes (Signed)
Pt states nurse already collected blood.  CHL showing blood not collected,  I will check with nurse.

## 2017-10-06 ENCOUNTER — Emergency Department (HOSPITAL_COMMUNITY): Payer: Self-pay

## 2017-10-06 LAB — COMPREHENSIVE METABOLIC PANEL
ALT: 14 U/L (ref 14–54)
AST: 20 U/L (ref 15–41)
Albumin: 3.8 g/dL (ref 3.5–5.0)
Alkaline Phosphatase: 70 U/L (ref 38–126)
Anion gap: 8 (ref 5–15)
BILIRUBIN TOTAL: 0.9 mg/dL (ref 0.3–1.2)
BUN: 9 mg/dL (ref 6–20)
CHLORIDE: 104 mmol/L (ref 101–111)
CO2: 23 mmol/L (ref 22–32)
CREATININE: 0.45 mg/dL (ref 0.44–1.00)
Calcium: 8.9 mg/dL (ref 8.9–10.3)
GFR calc Af Amer: >60 mL/min (ref >60)
Glucose, Bld: 298 mg/dL — ABNORMAL HIGH (ref 65–99)
Potassium: 3.5 mmol/L (ref 3.5–5.1)
Sodium: 135 mmol/L (ref 135–145)
Total Protein: 6 g/dL — ABNORMAL LOW (ref 6.5–8.1)

## 2017-10-06 LAB — I-STAT BETA HCG BLOOD, ED (MC, WL, AP ONLY): I-stat hCG, quantitative: <5 m[IU]/mL (ref <5)

## 2017-10-06 LAB — APTT: APTT: 27 s (ref 24–36)

## 2017-10-06 LAB — CBC
HEMATOCRIT: 30.8 % — AB (ref 36.0–46.0)
HEMOGLOBIN: 10.3 g/dL — AB (ref 12.0–15.0)
MCH: 26.6 pg (ref 26.0–34.0)
MCHC: 33.4 g/dL (ref 30.0–36.0)
MCV: 79.6 fL (ref 78.0–100.0)
Platelets: 172 10*3/uL (ref 150–400)
RBC: 3.87 MIL/uL (ref 3.87–5.11)
RDW: 15.1 % (ref 11.5–15.5)
WBC: 6.4 10*3/uL (ref 4.0–10.5)

## 2017-10-06 LAB — PROTIME-INR
INR: 1.05
Prothrombin Time: 13.6 seconds (ref 11.4–15.2)

## 2017-10-06 LAB — DIFFERENTIAL
BASOS ABS: 0 10*3/uL (ref 0.0–0.1)
Basophils Relative: 1 %
Eosinophils Absolute: 0.2 10*3/uL (ref 0.0–0.7)
Eosinophils Relative: 3 %
LYMPHS ABS: 1.3 10*3/uL (ref 0.7–4.0)
Lymphocytes Relative: 21 %
MONOS PCT: 7 %
Monocytes Absolute: 0.5 10*3/uL (ref 0.1–1.0)
NEUTROS ABS: 4.3 10*3/uL (ref 1.7–7.7)
Neutrophils Relative %: 68 %

## 2017-10-06 LAB — I-STAT TROPONIN, ED: TROPONIN I, POC: 0 ng/mL (ref 0.00–0.08)

## 2017-10-06 LAB — ETHANOL: Alcohol, Ethyl (B): <10 mg/dL (ref <10)

## 2017-10-06 MED ORDER — METFORMIN HCL 500 MG PO TABS: 500.0000 mg | ORAL_TABLET | Freq: Every day | ORAL | 0 refills | Status: DC

## 2017-10-06 MED ORDER — CLOPIDOGREL BISULFATE 75 MG PO TABS: 75.0000 mg | ORAL_TABLET | Freq: Every day | ORAL | 0 refills | Status: DC

## 2017-10-06 MED ORDER — ASPIRIN 325 MG PO TBEC: 325.0000 mg | DELAYED_RELEASE_TABLET | Freq: Every day | ORAL | 2 refills | Status: DC

## 2017-10-06 MED ORDER — METOCLOPRAMIDE HCL 10 MG PO TABS: 10.0000 mg | ORAL_TABLET | Freq: Four times a day (QID) | ORAL | 0 refills | Status: DC | PRN

## 2017-10-06 MED ORDER — GLIPIZIDE 10 MG PO TABS: 10.0000 mg | ORAL_TABLET | Freq: Every day | ORAL | 0 refills | Status: DC

## 2017-10-06 MED ORDER — ATORVASTATIN CALCIUM 40 MG PO TABS: 40.0000 mg | ORAL_TABLET | Freq: Every day | ORAL | 0 refills | Status: DC

## 2017-10-06 NOTE — Discharge Instructions (Signed)
Your MRI did not show signs of a stroke.  Your numbness was related to your headache, which is a migraine.  I am concerned that you are not taking your medications.  Some of your medications are to prevent another stroke, others are too control your diabetes.  Uncontrolled diabetes leads to heart attacks, strokes, kidney failure, blindness, limb amputation.  You need to take responsibility for taking your medications and doing what you can to manage her diabetes.  Please make a follow-up appointment with your doctor in the internal medicine clinic and keep your appointments with them.  Please take your medications every day, and monitor your blood sugars at home.

## 2017-10-06 NOTE — ED Notes (Signed)
Pt. To MRI via stretcher. 

## 2017-11-03 ENCOUNTER — Encounter (HOSPITAL_COMMUNITY): Payer: Self-pay | Admitting: Emergency Medicine

## 2017-11-03 ENCOUNTER — Emergency Department (HOSPITAL_COMMUNITY)
Admission: EM | Admit: 2017-11-03 | Discharge: 2017-11-03 | Disposition: A | Discharge status: Home / Self Care | Payer: Self-pay | Attending: Emergency Medicine | Admitting: Emergency Medicine

## 2017-11-03 DIAGNOSIS — Z7902 Long term (current) use of antithrombotics/antiplatelets: Secondary | ICD-10-CM | POA: Insufficient documentation

## 2017-11-03 DIAGNOSIS — G43809 Other migraine, not intractable, without status migrainosus: Secondary | ICD-10-CM | POA: Insufficient documentation

## 2017-11-03 DIAGNOSIS — Z7984 Long term (current) use of oral hypoglycemic drugs: Secondary | ICD-10-CM | POA: Insufficient documentation

## 2017-11-03 DIAGNOSIS — Z8673 Personal history of transient ischemic attack (TIA), and cerebral infarction without residual deficits: Secondary | ICD-10-CM | POA: Insufficient documentation

## 2017-11-03 DIAGNOSIS — E119 Type 2 diabetes mellitus without complications: Secondary | ICD-10-CM | POA: Insufficient documentation

## 2017-11-03 MED ORDER — DIPHENHYDRAMINE HCL 50 MG/ML IJ SOLN
25.0000 mg | Freq: Once | INTRAMUSCULAR | Status: AC
  Administered 2017-11-03: 25 mg via INTRAVENOUS
  Filled 2017-11-03: qty 1

## 2017-11-03 MED ORDER — METOCLOPRAMIDE HCL 5 MG/ML IJ SOLN
10.0000 mg | Freq: Once | INTRAMUSCULAR | Status: AC
  Administered 2017-11-03: 10 mg via INTRAVENOUS
  Filled 2017-11-03: qty 2

## 2017-11-03 MED ORDER — SODIUM CHLORIDE 0.9 % IV BOLUS (SEPSIS)
1000.0000 mL | Freq: Once | INTRAVENOUS | Status: AC
  Administered 2017-11-03: 1000 mL via INTRAVENOUS

## 2017-11-03 NOTE — ED Triage Notes (Signed)
Patient reports migraine headache this evening with emesis x1 , denies head injury, alert and oriented/respirations unlabored , no vomiting at triage , denies photophobia .

## 2017-11-03 NOTE — ED Notes (Signed)
ED Provider at bedside. 
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
## 2017-11-03 NOTE — ED Notes (Signed)
No response when called to place in room.

## 2017-12-09 ENCOUNTER — Other Ambulatory Visit: Payer: Self-pay

## 2017-12-09 ENCOUNTER — Emergency Department (HOSPITAL_COMMUNITY)
Admission: EM | Admit: 2017-12-09 | Discharge: 2017-12-09 | Disposition: A | Discharge status: Home / Self Care | Payer: Self-pay | Attending: Emergency Medicine | Admitting: Emergency Medicine

## 2017-12-09 ENCOUNTER — Encounter (HOSPITAL_COMMUNITY): Payer: Self-pay | Admitting: *Deleted

## 2017-12-09 DIAGNOSIS — Z7984 Long term (current) use of oral hypoglycemic drugs: Secondary | ICD-10-CM | POA: Insufficient documentation

## 2017-12-09 DIAGNOSIS — E119 Type 2 diabetes mellitus without complications: Secondary | ICD-10-CM | POA: Insufficient documentation

## 2017-12-09 DIAGNOSIS — Z7902 Long term (current) use of antithrombotics/antiplatelets: Secondary | ICD-10-CM | POA: Insufficient documentation

## 2017-12-09 DIAGNOSIS — Z8673 Personal history of transient ischemic attack (TIA), and cerebral infarction without residual deficits: Secondary | ICD-10-CM | POA: Insufficient documentation

## 2017-12-09 DIAGNOSIS — R202 Paresthesia of skin: Secondary | ICD-10-CM

## 2017-12-09 DIAGNOSIS — R112 Nausea with vomiting, unspecified: Secondary | ICD-10-CM | POA: Insufficient documentation

## 2017-12-09 DIAGNOSIS — R51 Headache: Secondary | ICD-10-CM | POA: Insufficient documentation

## 2017-12-09 DIAGNOSIS — R519 Headache, unspecified: Secondary | ICD-10-CM

## 2017-12-09 LAB — URINALYSIS, ROUTINE W REFLEX MICROSCOPIC
Bilirubin Urine: NEGATIVE
Ketones, ur: NEGATIVE mg/dL
Nitrite: NEGATIVE
PROTEIN: NEGATIVE mg/dL
Specific Gravity, Urine: 1.023 (ref 1.005–1.030)
pH: 7 (ref 5.0–8.0)

## 2017-12-09 LAB — CBC WITH DIFFERENTIAL/PLATELET
Basophils Absolute: 0 10*3/uL (ref 0.0–0.1)
Basophils Relative: 1 %
EOS ABS: 0.2 10*3/uL (ref 0.0–0.7)
Eosinophils Relative: 4 %
HCT: 30.6 % — ABNORMAL LOW (ref 36.0–46.0)
HEMOGLOBIN: 10.2 g/dL — AB (ref 12.0–15.0)
LYMPHS ABS: 0.9 10*3/uL (ref 0.7–4.0)
Lymphocytes Relative: 20 %
MCH: 26.7 pg (ref 26.0–34.0)
MCHC: 33.3 g/dL (ref 30.0–36.0)
MCV: 80.1 fL (ref 78.0–100.0)
MONOS PCT: 8 %
Monocytes Absolute: 0.3 10*3/uL (ref 0.1–1.0)
NEUTROS PCT: 67 %
Neutro Abs: 3 10*3/uL (ref 1.7–7.7)
Platelets: 157 10*3/uL (ref 150–400)
RBC: 3.82 MIL/uL — ABNORMAL LOW (ref 3.87–5.11)
RDW: 16.2 % — ABNORMAL HIGH (ref 11.5–15.5)
WBC: 4.5 10*3/uL (ref 4.0–10.5)

## 2017-12-09 LAB — BASIC METABOLIC PANEL
Anion gap: 10 (ref 5–15)
BUN: 10 mg/dL (ref 6–20)
CHLORIDE: 104 mmol/L (ref 101–111)
CO2: 20 mmol/L — AB (ref 22–32)
CREATININE: 0.43 mg/dL — AB (ref 0.44–1.00)
Calcium: 8.8 mg/dL — ABNORMAL LOW (ref 8.9–10.3)
GFR calc non Af Amer: >60 mL/min (ref >60)
Glucose, Bld: 367 mg/dL — ABNORMAL HIGH (ref 65–99)
Potassium: 4.4 mmol/L (ref 3.5–5.1)
Sodium: 134 mmol/L — ABNORMAL LOW (ref 135–145)

## 2017-12-09 LAB — CBG MONITORING, ED: Glucose-Capillary: 380 mg/dL — ABNORMAL HIGH (ref 65–99)

## 2017-12-09 MED ORDER — SODIUM CHLORIDE 0.9 % IV BOLUS (SEPSIS)
1000.0000 mL | Freq: Once | INTRAVENOUS | Status: AC
  Administered 2017-12-09: 1000 mL via INTRAVENOUS

## 2017-12-09 MED ORDER — DIPHENHYDRAMINE HCL 50 MG/ML IJ SOLN
25.0000 mg | Freq: Once | INTRAMUSCULAR | Status: AC
  Administered 2017-12-09: 25 mg via INTRAVENOUS
  Filled 2017-12-09: qty 1

## 2017-12-09 MED ORDER — METOCLOPRAMIDE HCL 5 MG/ML IJ SOLN
10.0000 mg | Freq: Once | INTRAMUSCULAR | Status: AC
  Administered 2017-12-09: 10 mg via INTRAVENOUS
  Filled 2017-12-09: qty 2

## 2017-12-09 NOTE — Discharge Instructions (Signed)
Symptoms likely from a migraine. You need better control of your blood glucose. Contact primary care doctor for further evaluation of diabetes. You may need medication changes.   I recommend neurology follow up for ongoing headaches.   Return for fevers, vision changes, facial droop, difficulty with speech or walking, one sided numbness weakness or heaviness

## 2017-12-09 NOTE — ED Triage Notes (Signed)
Pt has had L sided tingling since getting off work yesterday. Reports pain goes from head down to waist. No neuro deficits noted. Has had same tingling in the past when she had a seizure
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
## 2018-02-14 ENCOUNTER — Emergency Department (HOSPITAL_COMMUNITY)
Admission: EM | Admit: 2018-02-14 | Discharge: 2018-02-14 | Disposition: A | Discharge status: Home / Self Care | Payer: Self-pay | Attending: Emergency Medicine | Admitting: Emergency Medicine

## 2018-02-14 ENCOUNTER — Emergency Department (HOSPITAL_COMMUNITY): Payer: Self-pay

## 2018-02-14 ENCOUNTER — Encounter (HOSPITAL_COMMUNITY): Payer: Self-pay | Admitting: *Deleted

## 2018-02-14 ENCOUNTER — Other Ambulatory Visit: Payer: Self-pay

## 2018-02-14 DIAGNOSIS — M545 Low back pain, unspecified: Secondary | ICD-10-CM

## 2018-02-14 DIAGNOSIS — E119 Type 2 diabetes mellitus without complications: Secondary | ICD-10-CM | POA: Insufficient documentation

## 2018-02-14 DIAGNOSIS — R519 Headache, unspecified: Secondary | ICD-10-CM

## 2018-02-14 DIAGNOSIS — R51 Headache: Secondary | ICD-10-CM | POA: Insufficient documentation

## 2018-02-14 DIAGNOSIS — Z79899 Other long term (current) drug therapy: Secondary | ICD-10-CM | POA: Insufficient documentation

## 2018-02-14 DIAGNOSIS — Z8673 Personal history of transient ischemic attack (TIA), and cerebral infarction without residual deficits: Secondary | ICD-10-CM | POA: Insufficient documentation

## 2018-02-14 LAB — BASIC METABOLIC PANEL
ANION GAP: 8 (ref 5–15)
BUN: 15 mg/dL (ref 6–20)
CALCIUM: 9.3 mg/dL (ref 8.9–10.3)
CO2: 25 mmol/L (ref 22–32)
Chloride: 103 mmol/L (ref 101–111)
Creatinine, Ser: 0.59 mg/dL (ref 0.44–1.00)
GFR calc Af Amer: >60 mL/min (ref >60)
GFR calc non Af Amer: >60 mL/min (ref >60)
GLUCOSE: 322 mg/dL — AB (ref 65–99)
Potassium: 4.5 mmol/L (ref 3.5–5.1)
Sodium: 136 mmol/L (ref 135–145)

## 2018-02-14 LAB — CBC WITH DIFFERENTIAL/PLATELET
BASOS PCT: 1 %
Basophils Absolute: 0.1 10*3/uL (ref 0.0–0.1)
EOS ABS: 0.2 10*3/uL (ref 0.0–0.7)
Eosinophils Relative: 3 %
HEMATOCRIT: 31.8 % — AB (ref 36.0–46.0)
Hemoglobin: 10.9 g/dL — ABNORMAL LOW (ref 12.0–15.0)
LYMPHS PCT: 17 %
Lymphs Abs: 1.3 10*3/uL (ref 0.7–4.0)
MCH: 27.2 pg (ref 26.0–34.0)
MCHC: 34.3 g/dL (ref 30.0–36.0)
MCV: 79.3 fL (ref 78.0–100.0)
Monocytes Absolute: 0.3 10*3/uL (ref 0.1–1.0)
Monocytes Relative: 4 %
NEUTROS ABS: 5.7 10*3/uL (ref 1.7–7.7)
Neutrophils Relative %: 75 %
Platelets: 230 10*3/uL (ref 150–400)
RBC: 4.01 MIL/uL (ref 3.87–5.11)
RDW: 15.7 % — AB (ref 11.5–15.5)
WBC: 7.6 10*3/uL (ref 4.0–10.5)

## 2018-02-14 LAB — I-STAT BETA HCG BLOOD, ED (MC, WL, AP ONLY): I-stat hCG, quantitative: <5 m[IU]/mL (ref <5)

## 2018-02-14 MED ORDER — KETOROLAC TROMETHAMINE 15 MG/ML IJ SOLN
15.0000 mg | Freq: Once | INTRAMUSCULAR | Status: AC
  Administered 2018-02-14: 15 mg via INTRAVENOUS
  Filled 2018-02-14: qty 1

## 2018-02-14 MED ORDER — SODIUM CHLORIDE 0.9 % IV BOLUS
1000.0000 mL | Freq: Once | INTRAVENOUS | Status: AC
  Administered 2018-02-14: 1000 mL via INTRAVENOUS

## 2018-02-14 MED ORDER — PROCHLORPERAZINE EDISYLATE 10 MG/2ML IJ SOLN
10.0000 mg | Freq: Once | INTRAMUSCULAR | Status: AC
  Administered 2018-02-14: 10 mg via INTRAVENOUS
  Filled 2018-02-14: qty 2
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
## 2018-02-14 NOTE — ED Triage Notes (Signed)
Pt in c/o lower back pain that started today while at work, pt also c/o headache and light sensitivity, pt states she has a history of stroke, no neuro deficits at this time

## 2018-02-14 NOTE — ED Provider Notes (Signed)
Patient placed in Quick Look pathway, seen and evaluated   Chief Complaint: Headache  HPI:   Joann Meyers is a 32 y.o. female, with a history of stroke, presenting to the ED with headache beginning two days ago. Pain is global, feels like a pressure, 10/10. States she has worsened pain between the eyes when she looks left or right. She has not had this feature for her headaches before. She took ibuprofen this morning without relief.  This is not similar to her stroke May 2018 that she states presented with twitching to the left eye and face.  Denies fever/chills, weakness, numbness, falls/trauma, CP, SOB, vision abnormalities, facial droop.  ROS: headache (one)  Physical Exam:   Gen: No distress  Neuro: Awake and Alert  Skin: Warm    Focused Exam:   No diaphoresis.  No pallor.  Pulmonary: No increased work of breathing.  Speaks in full sentences without difficulty.  No tachypnea.  Cardiac: Normal rate and regular. Peripheral pulses intact.   Neurologic:  No sensory deficits.  No noted speech deficits. No aphasia. Patient handles oral secretions without difficulty. No noted swallowing defects. Equal grip strength bilaterally. Strength 5/5 in the upper extremities. Strength 5/5 with flexion and extension of the hips, knees, and ankles bilaterally.  Negative Romberg. No gait disturbance.  Coordination intact including heel to shin and finger to nose.  Cranial nerves III-XII grossly intact.  No facial droop.   MSK: No peripheral edema.   Initiation of care has begun. The patient has been counseled on the process, plan, and necessity for staying for the completion/evaluation, and the remainder of the medical screening examination   Layla Maw 02/14/18 [place], MD 02/14/18 2358

## 2018-02-14 NOTE — Discharge Instructions (Signed)
Nice to meet you Joann Meyers Your headache has improved with some fluids and medications which should also help your back pain. You can continue to take medications like ibuprofen and tylenol to help this continue to get better. We recommend establishing with a primary care doctor to try to get a better idea of how to prevent further headaches from happening.

## 2018-02-14 NOTE — ED Notes (Signed)
Pt discharged from ED; instructions provided; Pt encouraged to return to ED if symptoms worsen and to f/u with PCP; Pt verbalized understanding of all instructions 

## 2018-04-15 ENCOUNTER — Ambulatory Visit (HOSPITAL_COMMUNITY)
Admission: EM | Admit: 2018-04-15 | Discharge: 2018-04-15 | Disposition: A | Discharge status: Home / Self Care | Payer: Self-pay | Attending: Urgent Care | Admitting: Urgent Care

## 2018-04-15 ENCOUNTER — Encounter (HOSPITAL_COMMUNITY): Payer: Self-pay | Admitting: Emergency Medicine

## 2018-04-15 DIAGNOSIS — Z8673 Personal history of transient ischemic attack (TIA), and cerebral infarction without residual deficits: Secondary | ICD-10-CM

## 2018-04-15 DIAGNOSIS — E1165 Type 2 diabetes mellitus with hyperglycemia: Secondary | ICD-10-CM

## 2018-04-15 DIAGNOSIS — S39012A Strain of muscle, fascia and tendon of lower back, initial encounter: Secondary | ICD-10-CM

## 2018-04-15 DIAGNOSIS — M62838 Other muscle spasm: Secondary | ICD-10-CM

## 2018-04-15 DIAGNOSIS — T148XXA Other injury of unspecified body region, initial encounter: Secondary | ICD-10-CM

## 2018-04-15 DIAGNOSIS — M545 Low back pain, unspecified: Secondary | ICD-10-CM

## 2018-04-15 DIAGNOSIS — M542 Cervicalgia: Secondary | ICD-10-CM

## 2018-04-15 MED ORDER — KETOROLAC TROMETHAMINE 60 MG/2ML IM SOLN
INTRAMUSCULAR | Status: AC
  Filled 2018-04-15: qty 2

## 2018-04-15 MED ORDER — FLUORESCEIN SODIUM 1 MG OP STRP
ORAL_STRIP | OPHTHALMIC | Status: AC
  Filled 2018-04-15: qty 1

## 2018-04-15 MED ORDER — MELOXICAM 15 MG PO TABS: 7.5000 mg | ORAL_TABLET | Freq: Every day | ORAL | 0 refills | Status: DC

## 2018-04-15 MED ORDER — EYE WASH OPHTH SOLN
OPHTHALMIC | Status: AC
  Filled 2018-04-15: qty 118

## 2018-04-15 MED ORDER — KETOROLAC TROMETHAMINE 60 MG/2ML IM SOLN
60.0000 mg | Freq: Once | INTRAMUSCULAR | Status: AC
  Administered 2018-04-15: 60 mg via INTRAMUSCULAR

## 2018-04-15 MED ORDER — CYCLOBENZAPRINE HCL 10 MG PO TABS: 10.0000 mg | ORAL_TABLET | Freq: Two times a day (BID) | ORAL | 0 refills | Status: DC | PRN
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
## 2018-04-15 NOTE — ED Triage Notes (Signed)
Pt here for lower back pain and right sided neck pain

## 2018-05-06 ENCOUNTER — Encounter: Payer: Self-pay | Admitting: Internal Medicine

## 2018-05-23 ENCOUNTER — Encounter (HOSPITAL_COMMUNITY): Payer: Self-pay

## 2018-05-23 ENCOUNTER — Ambulatory Visit (HOSPITAL_COMMUNITY)
Admission: EM | Admit: 2018-05-23 | Discharge: 2018-05-23 | Disposition: A | Discharge status: Home / Self Care | Payer: Self-pay | Attending: Family Medicine | Admitting: Family Medicine

## 2018-05-23 DIAGNOSIS — K047 Periapical abscess without sinus: Secondary | ICD-10-CM

## 2018-05-23 MED ORDER — PENICILLIN V POTASSIUM 500 MG PO TABS: 500.0000 mg | ORAL_TABLET | Freq: Four times a day (QID) | ORAL | 0 refills | Status: AC

## 2018-05-23 MED ORDER — NAPROXEN 500 MG PO TABS: 500.0000 mg | ORAL_TABLET | Freq: Two times a day (BID) | ORAL | 0 refills | Status: DC

## 2018-05-23 NOTE — Discharge Instructions (Addendum)
It was nice meeting you!!  We will treat you for a dental infection and give you something for pain.  Try to follow up with a dentist as soon as possible.  You can take the naproxen twice a day with food and you may also take  Tylenol with the naproxen up to 1,000 mg at a time. Do not exceed 3,000 mg in a day. Orajel may also help.
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
## 2018-05-23 NOTE — ED Triage Notes (Signed)
Pt presents with dental pain on right side. 

## 2018-06-10 ENCOUNTER — Encounter (HOSPITAL_COMMUNITY): Payer: Self-pay | Admitting: Emergency Medicine

## 2018-06-10 ENCOUNTER — Ambulatory Visit (HOSPITAL_COMMUNITY)
Admission: EM | Admit: 2018-06-10 | Discharge: 2018-06-10 | Disposition: A | Discharge status: Home / Self Care | Payer: Self-pay | Attending: Family Medicine | Admitting: Family Medicine

## 2018-06-10 DIAGNOSIS — M545 Low back pain, unspecified: Secondary | ICD-10-CM

## 2018-06-10 DIAGNOSIS — K920 Hematemesis: Secondary | ICD-10-CM

## 2018-06-10 DIAGNOSIS — M542 Cervicalgia: Secondary | ICD-10-CM

## 2018-06-10 DIAGNOSIS — R519 Headache, unspecified: Secondary | ICD-10-CM

## 2018-06-10 DIAGNOSIS — G8929 Other chronic pain: Secondary | ICD-10-CM

## 2018-06-10 DIAGNOSIS — R51 Headache: Secondary | ICD-10-CM

## 2018-06-10 LAB — POCT I-STAT, CHEM 8
BUN: 8 mg/dL (ref 6–20)
Calcium, Ion: 1.21 mmol/L (ref 1.15–1.40)
Chloride: 101 mmol/L (ref 98–111)
Creatinine, Ser: 0.3 mg/dL — ABNORMAL LOW (ref 0.44–1.00)
Glucose, Bld: 216 mg/dL — ABNORMAL HIGH (ref 70–99)
HEMATOCRIT: 33 % — AB (ref 36.0–46.0)
HEMOGLOBIN: 11.2 g/dL — AB (ref 12.0–15.0)
Potassium: 3.6 mmol/L (ref 3.5–5.1)
Sodium: 138 mmol/L (ref 135–145)
TCO2: 24 mmol/L (ref 22–32)

## 2018-06-10 MED ORDER — CYCLOBENZAPRINE HCL 5 MG PO TABS: 5.0000 mg | ORAL_TABLET | Freq: Every day | ORAL | 0 refills | Status: DC

## 2018-06-10 MED ORDER — ACETAMINOPHEN 500 MG PO TABS: 500.0000 mg | ORAL_TABLET | Freq: Four times a day (QID) | ORAL | 0 refills | Status: DC | PRN

## 2018-06-10 MED ORDER — KETOROLAC TROMETHAMINE 30 MG/ML IJ SOLN
INTRAMUSCULAR | Status: AC
  Filled 2018-06-10: qty 1

## 2018-06-10 MED ORDER — OMEPRAZOLE 20 MG PO CPDR: 20.0000 mg | DELAYED_RELEASE_CAPSULE | Freq: Every day | ORAL | 0 refills | Status: DC

## 2018-06-10 MED ORDER — KETOROLAC TROMETHAMINE 30 MG/ML IJ SOLN
30.0000 mg | Freq: Once | INTRAMUSCULAR | Status: AC
  Administered 2018-06-10: 30 mg via INTRAMUSCULAR

## 2018-06-10 NOTE — ED Triage Notes (Signed)
Pt here for lower back pain and HA x 1 week intermittent; pt sts unable to go to work today

## 2018-06-10 NOTE — Discharge Instructions (Signed)
Tylenol as needed for pain.  May use flexeril at night to help with neck and back. May cause drowsiness. Please do not take if driving or drinking alcohol.  Sleep with pillows under your knees.  Please start daily prilosec.  Please follow up with your primary care provider for management of your chronic diseases and recheck of your pain.  If develop frequent vomiting of blood, abdominal pain, nausea, weakness or lightheadedness please go to the Er.
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
## 2018-06-28 ENCOUNTER — Ambulatory Visit (HOSPITAL_COMMUNITY)
Admission: EM | Admit: 2018-06-28 | Discharge: 2018-06-28 | Disposition: A | Discharge status: Home / Self Care | Payer: BLUE CROSS/BLUE SHIELD | Attending: Emergency Medicine | Admitting: Emergency Medicine

## 2018-06-28 ENCOUNTER — Encounter (HOSPITAL_COMMUNITY): Payer: Self-pay | Admitting: Emergency Medicine

## 2018-06-28 DIAGNOSIS — R51 Headache: Secondary | ICD-10-CM | POA: Diagnosis not present

## 2018-06-28 DIAGNOSIS — R0789 Other chest pain: Secondary | ICD-10-CM

## 2018-06-28 DIAGNOSIS — R519 Headache, unspecified: Secondary | ICD-10-CM

## 2018-06-28 MED ORDER — CYCLOBENZAPRINE HCL 5 MG PO TABS: 5.0000 mg | ORAL_TABLET | Freq: Every evening | ORAL | 0 refills | Status: DC | PRN

## 2018-06-28 MED ORDER — ONDANSETRON 4 MG PO TBDP
ORAL_TABLET | ORAL | Status: AC
  Filled 2018-06-28: qty 1

## 2018-06-28 MED ORDER — FLUTICASONE PROPIONATE 50 MCG/ACT NA SUSP: 2.0000 | Freq: Every day | NASAL | 0 refills | Status: DC

## 2018-06-28 MED ORDER — DEXAMETHASONE SODIUM PHOSPHATE 10 MG/ML IJ SOLN
10.0000 mg | Freq: Once | INTRAMUSCULAR | Status: AC
  Administered 2018-06-28: 10 mg via INTRAMUSCULAR

## 2018-06-28 MED ORDER — KETOROLAC TROMETHAMINE 30 MG/ML IJ SOLN
INTRAMUSCULAR | Status: AC
  Filled 2018-06-28: qty 1

## 2018-06-28 MED ORDER — MELOXICAM 7.5 MG PO TABS: 7.5000 mg | ORAL_TABLET | Freq: Every day | ORAL | 0 refills | Status: DC

## 2018-06-28 MED ORDER — KETOROLAC TROMETHAMINE 30 MG/ML IJ SOLN
30.0000 mg | Freq: Once | INTRAMUSCULAR | Status: AC
  Administered 2018-06-28: 30 mg via INTRAMUSCULAR

## 2018-06-28 MED ORDER — ONDANSETRON 4 MG PO TBDP
4.0000 mg | ORAL_TABLET | Freq: Once | ORAL | Status: AC
  Administered 2018-06-28: 4 mg via ORAL

## 2018-06-28 MED ORDER — DEXAMETHASONE SODIUM PHOSPHATE 10 MG/ML IJ SOLN
INTRAMUSCULAR | Status: AC
  Filled 2018-06-28: qty 1
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
## 2018-06-28 NOTE — Discharge Instructions (Addendum)
Toradol, decadron, zofran in office today. Start mobic tomorrow for muscle pain. Do not take ibuprofen (motrin/advil)/ naproxen (aleve) while on mobic. Flexeril as needed at night. Flexeril can make you drowsy, so do not take if you are going to drive, operate heavy machinery, or make important decisions. Flonase for sinus pressure. Keep hydrated, your urine should be clear to pale yellow in color. If experiencing worsening chest pain, shortness of breath, palpitations, worsening dizziness/lightheadedness, confusion, go to the emergency department for further evaluation needed.

## 2018-06-28 NOTE — ED Triage Notes (Signed)
Pt here for cough and pain with cough; pt sts HA x 1 week;

## 2018-07-03 ENCOUNTER — Ambulatory Visit: Payer: Self-pay

## 2018-07-14 ENCOUNTER — Other Ambulatory Visit: Payer: Self-pay

## 2018-07-14 ENCOUNTER — Emergency Department (HOSPITAL_COMMUNITY)
Admission: EM | Admit: 2018-07-14 | Discharge: 2018-07-14 | Disposition: A | Discharge status: Home / Self Care | Payer: BLUE CROSS/BLUE SHIELD | Attending: Emergency Medicine | Admitting: Emergency Medicine

## 2018-07-14 DIAGNOSIS — Z5321 Procedure and treatment not carried out due to patient leaving prior to being seen by health care provider: Secondary | ICD-10-CM | POA: Diagnosis not present

## 2018-07-14 DIAGNOSIS — R079 Chest pain, unspecified: Secondary | ICD-10-CM | POA: Insufficient documentation

## 2018-07-14 LAB — I-STAT BETA HCG BLOOD, ED (MC, WL, AP ONLY)

## 2018-07-14 LAB — CBC
HEMATOCRIT: 29.9 % — AB (ref 36.0–46.0)
Hemoglobin: 9.6 g/dL — ABNORMAL LOW (ref 12.0–15.0)
MCH: 26.2 pg (ref 26.0–34.0)
MCHC: 32.1 g/dL (ref 30.0–36.0)
MCV: 81.5 fL (ref 78.0–100.0)
Platelets: 151 10*3/uL (ref 150–400)
RBC: 3.67 MIL/uL — AB (ref 3.87–5.11)
RDW: 15.6 % — AB (ref 11.5–15.5)
WBC: 5.9 10*3/uL (ref 4.0–10.5)

## 2018-07-14 LAB — BASIC METABOLIC PANEL
Anion gap: 9 (ref 5–15)
BUN: 8 mg/dL (ref 6–20)
CO2: 24 mmol/L (ref 22–32)
CREATININE: 0.51 mg/dL (ref 0.44–1.00)
Calcium: 8.9 mg/dL (ref 8.9–10.3)
Chloride: 104 mmol/L (ref 98–111)
Glucose, Bld: 399 mg/dL — ABNORMAL HIGH (ref 70–99)
POTASSIUM: 3.4 mmol/L — AB (ref 3.5–5.1)
SODIUM: 137 mmol/L (ref 135–145)

## 2018-07-14 LAB — I-STAT TROPONIN, ED: Troponin i, poc: 0 ng/mL (ref 0.00–0.08)

## 2018-07-14 LAB — CBG MONITORING, ED: Glucose-Capillary: 374 mg/dL — ABNORMAL HIGH (ref 70–99)

## 2018-07-14 NOTE — ED Triage Notes (Signed)
Patient c/o CP and dizziness that started after crying (she states she broke up with her fiance).

## 2018-07-14 NOTE — ED Notes (Signed)
Patient called (3) times by triage nurse and staff; (6) times by radiology with no answer.

## 2018-07-14 NOTE — ED Notes (Signed)
Patient states that she is diabetic but takes no medication for it.

## 2018-07-14 NOTE — ED Notes (Signed)
Called pt. For radiology, no answer, not visible in waiting room. Will call again

## 2018-08-08 ENCOUNTER — Encounter (HOSPITAL_COMMUNITY): Payer: Self-pay | Admitting: Emergency Medicine

## 2018-08-08 ENCOUNTER — Ambulatory Visit (HOSPITAL_COMMUNITY)
Admission: EM | Admit: 2018-08-08 | Discharge: 2018-08-08 | Disposition: A | Discharge status: Home / Self Care | Payer: BLUE CROSS/BLUE SHIELD | Attending: Family Medicine | Admitting: Family Medicine

## 2018-08-08 DIAGNOSIS — B373 Candidiasis of vulva and vagina: Secondary | ICD-10-CM | POA: Diagnosis not present

## 2018-08-08 DIAGNOSIS — B3731 Acute candidiasis of vulva and vagina: Secondary | ICD-10-CM

## 2018-08-08 MED ORDER — NYSTATIN-TRIAMCINOLONE 100000-0.1 UNIT/GM-% EX CREA: TOPICAL_CREAM | CUTANEOUS | 0 refills | Status: DC

## 2018-08-08 MED ORDER — FLUCONAZOLE 150 MG PO TABS: 150.0000 mg | ORAL_TABLET | Freq: Every day | ORAL | 0 refills | Status: DC

## 2018-08-08 NOTE — ED Triage Notes (Signed)
Pt c/o vaginal burning and itching x1 week.

## 2018-08-08 NOTE — Discharge Instructions (Addendum)
Take the antifungal pill as directed Use the cream 2-3 times a day See your PCP in follow up You need to go back on the diabetes medicine
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
## 2018-09-16 ENCOUNTER — Ambulatory Visit (HOSPITAL_COMMUNITY)
Admission: EM | Admit: 2018-09-16 | Discharge: 2018-09-16 | Disposition: A | Discharge status: Home / Self Care | Payer: BLUE CROSS/BLUE SHIELD | Attending: Family Medicine | Admitting: Family Medicine

## 2018-09-16 ENCOUNTER — Encounter (HOSPITAL_COMMUNITY): Payer: Self-pay | Admitting: Emergency Medicine

## 2018-09-16 DIAGNOSIS — K029 Dental caries, unspecified: Secondary | ICD-10-CM

## 2018-09-16 MED ORDER — PENICILLIN V POTASSIUM 500 MG PO TABS: 500.0000 mg | ORAL_TABLET | Freq: Four times a day (QID) | ORAL | 0 refills | Status: DC

## 2018-09-16 NOTE — Discharge Instructions (Signed)
Penicillin to treat the dental infection Keep using the over-the-counter mouthwash to help with pain Follow-up with the dentist as planned

## 2018-09-16 NOTE — ED Triage Notes (Signed)
Pt here for left sided dental pain 
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
## 2018-09-20 ENCOUNTER — Encounter (HOSPITAL_COMMUNITY): Payer: Self-pay

## 2018-09-20 ENCOUNTER — Emergency Department (HOSPITAL_COMMUNITY): Payer: BLUE CROSS/BLUE SHIELD

## 2018-09-20 ENCOUNTER — Observation Stay (HOSPITAL_COMMUNITY)
Admission: EM | Admit: 2018-09-20 | Discharge: 2018-09-22 | Disposition: A | Discharge status: Home / Self Care | Payer: BLUE CROSS/BLUE SHIELD | Attending: Internal Medicine | Admitting: Internal Medicine

## 2018-09-20 DIAGNOSIS — G459 Transient cerebral ischemic attack, unspecified: Secondary | ICD-10-CM | POA: Diagnosis not present

## 2018-09-20 DIAGNOSIS — R531 Weakness: Secondary | ICD-10-CM

## 2018-09-20 DIAGNOSIS — E041 Nontoxic single thyroid nodule: Secondary | ICD-10-CM | POA: Diagnosis present

## 2018-09-20 DIAGNOSIS — R253 Fasciculation: Secondary | ICD-10-CM | POA: Diagnosis present

## 2018-09-20 DIAGNOSIS — Z91013 Allergy to seafood: Secondary | ICD-10-CM | POA: Insufficient documentation

## 2018-09-20 DIAGNOSIS — M542 Cervicalgia: Secondary | ICD-10-CM | POA: Diagnosis present

## 2018-09-20 DIAGNOSIS — Z791 Long term (current) use of non-steroidal anti-inflammatories (NSAID): Secondary | ICD-10-CM | POA: Insufficient documentation

## 2018-09-20 DIAGNOSIS — D509 Iron deficiency anemia, unspecified: Secondary | ICD-10-CM | POA: Diagnosis present

## 2018-09-20 DIAGNOSIS — R200 Anesthesia of skin: Secondary | ICD-10-CM | POA: Diagnosis present

## 2018-09-20 DIAGNOSIS — Z79899 Other long term (current) drug therapy: Secondary | ICD-10-CM | POA: Insufficient documentation

## 2018-09-20 DIAGNOSIS — Z8673 Personal history of transient ischemic attack (TIA), and cerebral infarction without residual deficits: Secondary | ICD-10-CM

## 2018-09-20 DIAGNOSIS — E119 Type 2 diabetes mellitus without complications: Secondary | ICD-10-CM

## 2018-09-20 DIAGNOSIS — O99019 Anemia complicating pregnancy, unspecified trimester: Secondary | ICD-10-CM | POA: Diagnosis present

## 2018-09-20 LAB — COMPREHENSIVE METABOLIC PANEL
ALBUMIN: 3.6 g/dL (ref 3.5–5.0)
ALK PHOS: 72 U/L (ref 38–126)
ALT: 14 U/L (ref 0–44)
ANION GAP: 11 (ref 5–15)
AST: 19 U/L (ref 15–41)
BUN: 9 mg/dL (ref 6–20)
CO2: 22 mmol/L (ref 22–32)
Calcium: 9.1 mg/dL (ref 8.9–10.3)
Chloride: 102 mmol/L (ref 98–111)
Creatinine, Ser: 0.47 mg/dL (ref 0.44–1.00)
GFR calc non Af Amer: >60 mL/min (ref >60)
GLUCOSE: 303 mg/dL — AB (ref 70–99)
Potassium: 3.8 mmol/L (ref 3.5–5.1)
Sodium: 135 mmol/L (ref 135–145)
TOTAL PROTEIN: 6.4 g/dL — AB (ref 6.5–8.1)
Total Bilirubin: 0.9 mg/dL (ref 0.3–1.2)

## 2018-09-20 LAB — CBC
HEMATOCRIT: 30.5 % — AB (ref 36.0–46.0)
Hemoglobin: 9.6 g/dL — ABNORMAL LOW (ref 12.0–15.0)
MCH: 24.7 pg — AB (ref 26.0–34.0)
MCHC: 31.5 g/dL (ref 30.0–36.0)
MCV: 78.6 fL — AB (ref 80.0–100.0)
PLATELETS: 196 10*3/uL (ref 150–400)
RBC: 3.88 MIL/uL (ref 3.87–5.11)
RDW: 15.4 % (ref 11.5–15.5)
WBC: 5.4 10*3/uL (ref 4.0–10.5)
nRBC: 0 % (ref 0.0–0.2)

## 2018-09-20 LAB — DIFFERENTIAL
Abs Immature Granulocytes: 0.02 10*3/uL (ref 0.00–0.07)
Basophils Absolute: 0 10*3/uL (ref 0.0–0.1)
Basophils Relative: 1 %
EOS ABS: 0.2 10*3/uL (ref 0.0–0.5)
EOS PCT: 3 %
Immature Granulocytes: 0 %
LYMPHS PCT: 19 %
Lymphs Abs: 1 10*3/uL (ref 0.7–4.0)
MONO ABS: 0.4 10*3/uL (ref 0.1–1.0)
MONOS PCT: 8 %
Neutro Abs: 3.7 10*3/uL (ref 1.7–7.7)
Neutrophils Relative %: 69 %

## 2018-09-20 LAB — PROTIME-INR
INR: 1.04
PROTHROMBIN TIME: 13.5 s (ref 11.4–15.2)

## 2018-09-20 LAB — APTT: aPTT: 28 seconds (ref 24–36)

## 2018-09-20 LAB — I-STAT BETA HCG BLOOD, ED (MC, WL, AP ONLY): I-stat hCG, quantitative: <5 m[IU]/mL (ref <5)

## 2018-09-20 LAB — CBG MONITORING, ED: GLUCOSE-CAPILLARY: 267 mg/dL — AB (ref 70–99)

## 2018-09-20 LAB — I-STAT TROPONIN, ED: TROPONIN I, POC: 0 ng/mL (ref 0.00–0.08)

## 2018-09-20 MED ORDER — IOPAMIDOL (ISOVUE-370) INJECTION 76%
INTRAVENOUS | Status: AC
  Filled 2018-09-20: qty 100
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
## 2018-09-20 NOTE — ED Notes (Signed)
Checked CBG 267, RN Lovena Le informed

## 2018-09-20 NOTE — ED Triage Notes (Signed)
Pt reports left sided numbness and left sided facial twitching since 5pm yesterday. Pt states it will last a few seconds and go away throughout the day. Hx of stroke in 2017 and states these are similar symptoms. No other neuro deficits noted. Pt alert and oriented.

## 2018-09-21 ENCOUNTER — Encounter (HOSPITAL_COMMUNITY): Payer: Self-pay | Admitting: Radiology

## 2018-09-21 ENCOUNTER — Emergency Department (HOSPITAL_COMMUNITY): Payer: BLUE CROSS/BLUE SHIELD

## 2018-09-21 ENCOUNTER — Observation Stay (HOSPITAL_COMMUNITY): Payer: BLUE CROSS/BLUE SHIELD

## 2018-09-21 ENCOUNTER — Other Ambulatory Visit: Payer: Self-pay

## 2018-09-21 DIAGNOSIS — G459 Transient cerebral ischemic attack, unspecified: Secondary | ICD-10-CM | POA: Diagnosis not present

## 2018-09-21 DIAGNOSIS — R531 Weakness: Secondary | ICD-10-CM | POA: Diagnosis not present

## 2018-09-21 DIAGNOSIS — D509 Iron deficiency anemia, unspecified: Secondary | ICD-10-CM

## 2018-09-21 DIAGNOSIS — E041 Nontoxic single thyroid nodule: Secondary | ICD-10-CM

## 2018-09-21 DIAGNOSIS — Z8673 Personal history of transient ischemic attack (TIA), and cerebral infarction without residual deficits: Secondary | ICD-10-CM

## 2018-09-21 DIAGNOSIS — E119 Type 2 diabetes mellitus without complications: Secondary | ICD-10-CM

## 2018-09-21 HISTORY — DX: Nontoxic single thyroid nodule: E04.1

## 2018-09-21 LAB — LIPID PANEL
CHOLESTEROL: 132 mg/dL (ref 0–200)
HDL: 23 mg/dL — ABNORMAL LOW (ref >40)
LDL Cholesterol: 74 mg/dL (ref 0–99)
Total CHOL/HDL Ratio: 5.7 RATIO
Triglycerides: 176 mg/dL — ABNORMAL HIGH (ref <150)
VLDL: 35 mg/dL (ref 0–40)

## 2018-09-21 LAB — CBC
HEMATOCRIT: 30.9 % — AB (ref 36.0–46.0)
Hemoglobin: 9.7 g/dL — ABNORMAL LOW (ref 12.0–15.0)
MCH: 24.9 pg — ABNORMAL LOW (ref 26.0–34.0)
MCHC: 31.4 g/dL (ref 30.0–36.0)
MCV: 79.2 fL — AB (ref 80.0–100.0)
Platelets: 181 10*3/uL (ref 150–400)
RBC: 3.9 MIL/uL (ref 3.87–5.11)
RDW: 15.6 % — ABNORMAL HIGH (ref 11.5–15.5)
WBC: 5.3 10*3/uL (ref 4.0–10.5)
nRBC: 0 % (ref 0.0–0.2)

## 2018-09-21 LAB — HIV ANTIBODY (ROUTINE TESTING W REFLEX): HIV Screen 4th Generation wRfx: NONREACTIVE

## 2018-09-21 LAB — HEMOGLOBIN A1C
HEMOGLOBIN A1C: 8.2 % — AB (ref 4.8–5.6)
Mean Plasma Glucose: 188.64 mg/dL

## 2018-09-21 LAB — CBG MONITORING, ED: Glucose-Capillary: 198 mg/dL — ABNORMAL HIGH (ref 70–99)

## 2018-09-21 LAB — GLUCOSE, CAPILLARY
Glucose-Capillary: 285 mg/dL — ABNORMAL HIGH (ref 70–99)
Glucose-Capillary: 269 mg/dL — ABNORMAL HIGH (ref 70–99)
Glucose-Capillary: 244 mg/dL — ABNORMAL HIGH (ref 70–99)
Glucose-Capillary: 278 mg/dL — ABNORMAL HIGH (ref 70–99)

## 2018-09-21 LAB — TSH: TSH: 4.658 u[IU]/mL — ABNORMAL HIGH (ref 0.350–4.500)

## 2018-09-21 MED ORDER — ENOXAPARIN SODIUM 40 MG/0.4ML ~~LOC~~ SOLN
40.0000 mg | Freq: Every day | SUBCUTANEOUS | Status: DC
  Administered 2018-09-21: 40 mg via SUBCUTANEOUS
  Filled 2018-09-21: qty 0.4

## 2018-09-21 MED ORDER — IBUPROFEN 200 MG PO TABS
200.0000 mg | ORAL_TABLET | Freq: Once | ORAL | Status: AC
  Administered 2018-09-21: 200 mg via ORAL
  Filled 2018-09-21: qty 1

## 2018-09-21 MED ORDER — IOPAMIDOL (ISOVUE-370) INJECTION 76%
75.0000 mL | Freq: Once | INTRAVENOUS | Status: AC | PRN
  Administered 2018-09-21: 75 mL via INTRAVENOUS

## 2018-09-21 MED ORDER — ACETAMINOPHEN 325 MG PO TABS
650.0000 mg | ORAL_TABLET | ORAL | Status: DC | PRN
  Administered 2018-09-21 – 2018-09-22 (×5): 650 mg via ORAL
  Filled 2018-09-21 (×5): qty 2

## 2018-09-21 MED ORDER — ACETAMINOPHEN 160 MG/5ML PO SOLN: 650.0000 mg | ORAL | Status: DC | PRN

## 2018-09-21 MED ORDER — ATORVASTATIN CALCIUM 80 MG PO TABS
80.0000 mg | ORAL_TABLET | Freq: Every day | ORAL | Status: DC
  Administered 2018-09-21: 80 mg via ORAL
  Filled 2018-09-21: qty 1

## 2018-09-21 MED ORDER — INSULIN ASPART 100 UNIT/ML ~~LOC~~ SOLN
0.0000 [IU] | Freq: Every day | SUBCUTANEOUS | Status: DC
  Administered 2018-09-21: 3 [IU] via SUBCUTANEOUS

## 2018-09-21 MED ORDER — ASPIRIN EC 81 MG PO TBEC
81.0000 mg | DELAYED_RELEASE_TABLET | Freq: Every day | ORAL | Status: DC
  Administered 2018-09-22: 81 mg via ORAL
  Filled 2018-09-21: qty 1

## 2018-09-21 MED ORDER — INSULIN ASPART 100 UNIT/ML ~~LOC~~ SOLN
0.0000 [IU] | Freq: Three times a day (TID) | SUBCUTANEOUS | Status: DC
  Administered 2018-09-21 (×2): 5 [IU] via SUBCUTANEOUS
  Administered 2018-09-21 – 2018-09-22 (×2): 3 [IU] via SUBCUTANEOUS
  Administered 2018-09-22: 7 [IU] via SUBCUTANEOUS

## 2018-09-21 MED ORDER — ONDANSETRON HCL 4 MG/2ML IJ SOLN
4.0000 mg | Freq: Four times a day (QID) | INTRAMUSCULAR | Status: DC | PRN
  Administered 2018-09-21 (×2): 4 mg via INTRAVENOUS
  Filled 2018-09-21 (×3): qty 2

## 2018-09-21 MED ORDER — ACETAMINOPHEN 650 MG RE SUPP: 650.0000 mg | RECTAL | Status: DC | PRN

## 2018-09-21 MED ORDER — SODIUM CHLORIDE 0.9 % IV SOLN
INTRAVENOUS | Status: DC
  Administered 2018-09-21 – 2018-09-22 (×3): via INTRAVENOUS

## 2018-09-21 MED ORDER — ASPIRIN 325 MG PO TABS
325.0000 mg | ORAL_TABLET | Freq: Once | ORAL | Status: AC
  Administered 2018-09-21: 325 mg via ORAL
  Filled 2018-09-21: qty 1

## 2018-09-21 MED ORDER — STROKE: EARLY STAGES OF RECOVERY BOOK
Freq: Once | Status: AC
  Administered 2018-09-21: 04:00:00
  Filled 2018-09-21: qty 1

## 2018-09-21 MED ORDER — ASPIRIN 325 MG PO TABS: 325.0000 mg | ORAL_TABLET | Freq: Every day | ORAL | Status: DC
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
## 2018-09-21 NOTE — ED Notes (Signed)
Nurse will draw labs. 

## 2018-09-21 NOTE — Accreditation Note (Signed)
Pt has started her menstrual cycle and was wanting warm water. Went to get zofran and tylenol and came back and pt was in the shower with her clothes on. Pt was taken out and put new gown on and back into the bed with heat pack. Advised pt to let someone know when she needs to get up.

## 2018-09-21 NOTE — ED Notes (Signed)
Pt returned from MRI °
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
## 2018-09-21 NOTE — Progress Notes (Signed)
  Date: 09/21/2018  Patient name: Joann Meyers  Medical record number: [id number]  Date of birth: 1986-05-13   I have seen and evaluated this patient and I have discussed the plan of care with the house staff. Please see their note for complete details. I concur with their findings with the following additions/corrections: The patient was admitted by the hospitalist service and is being turned over to internal medicine.  She is suspected of having a TIA.  Her imaging so far has been negative but her echo is still pending.  She has not been on any anti-platelet agents recently and it does not appear that the hospitalist ordered an antiplatelet agent admission so we will do that.  Additionally, statin was not ordered so we will also order a statin.  Her blood pressure is currently acceptable and she is not on any antihypertensives here in the hospital.  She has not been seen in our clinic since July 2018 and has no showed for subsequent appointments.  She is at risk of being dismissed from our clinic.  I asked her barriers for attending appointments and she denied that she has any barriers.  She was noncommittal to attending future appointments.  She has poorly controlled diabetes with an A1c of 8.2 on admission.  She is allegedly on metformin 500 daily but notes indicated she just started this a week ago.  We will finished her stroke work-up but it looks like the likely etiology of her symptoms are TIA.  She needs to be on antiplatelet agent, aspirin 325 and a high intensity statin.  She needs to get her diabetes controlled and have been blood pressure assessed as an outpatient.  Bartholomew Crews, MD 09/21/2018, 12:38 PM
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
## 2018-09-21 NOTE — Progress Notes (Addendum)
Sleep technician at bedside for sleep study. She will return in the morning to collect equipment. [place] with [facility].

## 2018-09-21 NOTE — ED Notes (Signed)
Pt noted to be ambulatory to the bathroom.  

## 2018-09-21 NOTE — ED Notes (Signed)
Admitting at bedside 

## 2018-09-21 NOTE — Progress Notes (Addendum)
Subjective:  Leza Apsey is a 32 y.o. with PMH of T2DM, Obesity, prior hx of CVA on 02/2017 admit for left sided weakness on hospital day 0  [name] was initially admitted to [facility] but transferred to [facility] with care taken over by [facility] at [facility].  Mrs. Earnest Conroy was examined and evaluated at bedside this AM. She states she was in her usual state of health until 09/18/18 when she began to notice spasms of left eyelid and lip that had been intermittent prior. She mentions that while she experienced these symptoms on her last CVA, she was concerned as they appear to be more severe and lasting longer with left sided arm weakness so she came to the ED to be evaluated. She mentions that currently she is not having these symptoms and her only complaint is abdominal cramping as her period just started. She denies any headaches/blurry vision/ seizures/ F/ N/V/D/C.  Objective:  Vital signs in last 24 hours: Vitals:   09/21/18 0200 09/21/18 0359 09/21/18 0402 09/21/18 0618  BP: 108/69 108/69  (!) 142/75  Pulse: 78 70  83  Resp: (!) 22 20  17   Temp:  98.5 F (36.9 C)  97.7 F (36.5 C)  TempSrc:  Oral  Oral  SpO2: 98%   100%  Weight:   90.7 kg   Height:   5\' 4"  (1.626 m)    Physical Exam  Constitutional: She appears well-developed and well-nourished. No distress.  HENT:  Head: Normocephalic and atraumatic.  Mouth/Throat: Oropharynx is clear and moist.  Eyes: Pupils are equal, round, and reactive to light. Conjunctivae and EOM are normal. No scleral icterus.  Neck: Normal range of motion. Neck supple.  Cardiovascular: Normal rate, regular rhythm, normal heart sounds and intact distal pulses.  Pulmonary/Chest: Effort normal and breath sounds normal.  Abdominal: Soft. Bowel sounds are normal. There is tenderness (lower quadrant tenderness to deep palpation).  Neurological:  Neurologic exam: Mental status: A&Ox3 Cranial Nerves: II:  PERRL III, IV, VI: Extra-occular motions intact bilaterally V, VII: Face symmetric, sensation intact in all 3 divisions  VIII: hearing normal to rubbing fingers bilaterally  IX, X: palate rises symmetrically XI: Head turn and shoulder shrug normal bilaterally  XII: tongue midline  Motor: Strength 5/5 on all upper and lower extremities, bulk muscle and tone are normal Gait:Normal Sensory: Light touch intact and symmetric bilaterally  Coordination: There is no dysmetria on finger-to-nose. Rapid alternating movement test normal. Heel to shin test normal.  Psychiatric: Normal mood and affect  Skin: Skin is warm and dry.  Psychiatric: She has a normal mood and affect. Her behavior is normal. Judgment and thought content normal.   Assessment/Plan:  Principal Problem:   Left-sided weakness Active Problems:   History of stroke   Type 2 diabetes mellitus (HCC)   Microcytic anemia   TIA (transient ischemic attack)   Thyroid nodule  Ms.Earnest Conroy is a 32yo F w/ PMH of T2DM, HTn, HLD, prior CVA in 02/2017 admit for left sided weakness. Her presenting symptoms have currently resolved and no acute infarct was noted on MRI and CTA showed stable right ICA occlusion. This event was most likely transient TIA due to her right ICA occlusion. She will need further testing to rule out other causes of her TIA but expect to discharge tomorrow.  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 CT head negative for bleed. CTA show right ICA occlusion consistent with prior  CTA. MRI show no acute intracranial infarct. Hgb A1c 8.2 LDL 74. - Appreciate neuro recs - ASA 325 mg today, 81 mg daily starting tomorrow - Atorvastatin 80mg  daily - F/u TTE, Routine EEG - c/w telemetry  - F/u SLP eval - F/u PT/OT - Discharge after above work up is completed  T2DM Fasting this AM 285. Hgb A1c 8.2 On  metformin at home no insulin - Glucose checks - Sliding scale insulin  DVT prophx: Lovenox Diet: Diabetic Code: Full  Dispo: Anticipated discharge in approximately 0-1 day(s).   Mosetta Anis, MD 09/21/2018, 7:16 AM Pager: [phone]

## 2018-09-21 NOTE — ED Notes (Signed)
Neurology at bedside.
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
## 2018-09-21 NOTE — ED Notes (Signed)
Patient transported to MRI 

## 2018-09-22 ENCOUNTER — Observation Stay (HOSPITAL_BASED_OUTPATIENT_CLINIC_OR_DEPARTMENT_OTHER): Payer: BLUE CROSS/BLUE SHIELD

## 2018-09-22 DIAGNOSIS — E118 Type 2 diabetes mellitus with unspecified complications: Secondary | ICD-10-CM | POA: Diagnosis not present

## 2018-09-22 DIAGNOSIS — Z6834 Body mass index (BMI) 34.0-34.9, adult: Secondary | ICD-10-CM

## 2018-09-22 DIAGNOSIS — E041 Nontoxic single thyroid nodule: Secondary | ICD-10-CM | POA: Diagnosis not present

## 2018-09-22 DIAGNOSIS — I361 Nonrheumatic tricuspid (valve) insufficiency: Secondary | ICD-10-CM | POA: Diagnosis not present

## 2018-09-22 DIAGNOSIS — Z7902 Long term (current) use of antithrombotics/antiplatelets: Secondary | ICD-10-CM

## 2018-09-22 DIAGNOSIS — K0889 Other specified disorders of teeth and supporting structures: Secondary | ICD-10-CM

## 2018-09-22 DIAGNOSIS — E669 Obesity, unspecified: Secondary | ICD-10-CM

## 2018-09-22 DIAGNOSIS — Z91018 Allergy to other foods: Secondary | ICD-10-CM

## 2018-09-22 DIAGNOSIS — Z7984 Long term (current) use of oral hypoglycemic drugs: Secondary | ICD-10-CM

## 2018-09-22 DIAGNOSIS — Z91013 Allergy to seafood: Secondary | ICD-10-CM

## 2018-09-22 DIAGNOSIS — G459 Transient cerebral ischemic attack, unspecified: Secondary | ICD-10-CM | POA: Diagnosis not present

## 2018-09-22 DIAGNOSIS — R531 Weakness: Secondary | ICD-10-CM | POA: Diagnosis not present

## 2018-09-22 DIAGNOSIS — Z7982 Long term (current) use of aspirin: Secondary | ICD-10-CM

## 2018-09-22 LAB — BASIC METABOLIC PANEL
Anion gap: 11 (ref 5–15)
BUN: 8 mg/dL (ref 6–20)
CO2: 22 mmol/L (ref 22–32)
Calcium: 8.7 mg/dL — ABNORMAL LOW (ref 8.9–10.3)
Chloride: 105 mmol/L (ref 98–111)
Creatinine, Ser: 0.4 mg/dL — ABNORMAL LOW (ref 0.44–1.00)
GFR calc Af Amer: >60 mL/min (ref >60)
GFR calc non Af Amer: >60 mL/min (ref >60)
Glucose, Bld: 249 mg/dL — ABNORMAL HIGH (ref 70–99)
Potassium: 4.1 mmol/L (ref 3.5–5.1)
Sodium: 138 mmol/L (ref 135–145)

## 2018-09-22 LAB — ECHOCARDIOGRAM COMPLETE
Height: 64 in
WEIGHTICAEL: 3199.3155 [oz_av]

## 2018-09-22 LAB — GLUCOSE, CAPILLARY
Glucose-Capillary: 228 mg/dL — ABNORMAL HIGH (ref 70–99)
Glucose-Capillary: 304 mg/dL — ABNORMAL HIGH (ref 70–99)

## 2018-09-22 MED ORDER — CLOPIDOGREL BISULFATE 75 MG PO TABS: 75.0000 mg | ORAL_TABLET | Freq: Every day | ORAL | Status: DC

## 2018-09-22 MED ORDER — CLOPIDOGREL BISULFATE 75 MG PO TABS: 75.0000 mg | ORAL_TABLET | Freq: Every day | ORAL | 0 refills | Status: AC

## 2018-09-22 MED ORDER — INSULIN GLARGINE 100 UNIT/ML ~~LOC~~ SOLN
7.0000 [IU] | Freq: Every day | SUBCUTANEOUS | Status: DC
  Administered 2018-09-22: 7 [IU] via SUBCUTANEOUS
  Filled 2018-09-22: qty 0.07

## 2018-09-22 MED ORDER — ATORVASTATIN CALCIUM 80 MG PO TABS: 80.0000 mg | ORAL_TABLET | Freq: Every day | ORAL | 0 refills | Status: DC

## 2018-09-22 MED ORDER — ASPIRIN 81 MG PO TBEC: 81.0000 mg | DELAYED_RELEASE_TABLET | Freq: Every day | ORAL | 0 refills | Status: DC

## 2018-09-22 NOTE — Discharge Summary (Signed)
Name: Joann Meyers MRN: [id number] DOB: [birth year] 32 y.o. PCP: Lorella Nimrod, MD  Date of Admission: 09/20/2018  7:34 PM Date of Discharge: 09/22/2018 Attending Physician: Larey Dresser, MD Discharge Diagnosis: 1. Transient Ischemic Attack  Discharge Medications: Allergies as of 09/22/2018      Reactions   Shellfish Allergy Anaphylaxis   Tomato Hives      Medication List    STOP taking these medications   acetaminophen 325 MG tablet Commonly known as:  TYLENOL   fluconazole 150 MG tablet Commonly known as:  DIFLUCAN   ibuprofen 200 MG tablet Commonly known as:  ADVIL,MOTRIN   nystatin-triamcinolone cream Commonly known as:  MYCOLOG II   penicillin v potassium 500 MG tablet Commonly known as:  VEETID     TAKE these medications   aspirin 81 MG EC tablet Take 1 tablet (81 mg total) by mouth daily.   atorvastatin 80 MG tablet Commonly known as:  LIPITOR Take 1 tablet (80 mg total) by mouth daily at 6 PM.   clopidogrel 75 MG tablet Commonly known as:  PLAVIX Take 1 tablet (75 mg total) by mouth daily for 21 days.   metFORMIN 500 MG tablet Commonly known as:  GLUCOPHAGE Take 500 mg by mouth daily with breakfast.       Disposition and follow-up:   [name] was discharged from [facility] in Good condition.  At the hospital follow up visit please address:  1.  TIA: Presented with left sided weakness. Please ensure her symptoms have not returned. Also, started on DAPT w/ asa and plavix. Please ensure she is on DAPT for 3 wks and then stop the plavix and continue aspirin therapy  2. T2DM: Noted hyperglycemic during inpatient stay. Hgb a1c 8.2 Please reassess if she require additional therapy in addition to metformin  3. Thyroid nodule: Found incidentally on CTA of head and neck. Please f/u with thyroid ultrasound if deemed appropriate.  2.  Labs / imaging needed at time of follow-up: BMP  3.  Pending labs/ test needing  follow-up: None  Follow-up Appointments: Follow-up Information    Lorella Nimrod, MD. Call.   Specialty:  Internal Medicine Contact information: [place] [address] [phone]        Garvin Fila, MD. Call.   Specialties:  Neurology, Radiology Contact information: [address] [place] [address] [place] Hospital Course by problem list: 1. TIA: Presented with left sided weakness and facial twitching. Resolved shortly after presentation. Following imaging only showed old injuries and no acute CVA: CT head showed redemonstration of old injury. CTA showed right iCA occlusion similar to prior CTA. MRI showed chronic right MCA territory infarcts. She was also evaluated with sleep study per neuro for participation in a clinical trial but found not to have sleep apnea and was excluded from the trial. Discharged w/ recommendation for DAPT for 3 weeks and then continued aspirin therapy indefinitely  Discharge Vitals:   BP 115/75 (BP Location: Left Arm)   Pulse 78   Temp 97.8 F (36.6 C) (Oral)   Resp 16   Ht 5\' 4"  (1.626 m)   Wt 90.7 kg   LMP 08/21/2018   SpO2 97%   BMI 34.32 kg/m   Pertinent Labs, Studies, and Procedures:  BMP Latest Ref Rng & Units 09/22/2018 09/20/2018 07/14/2018  Glucose 70 - 99 mg/dL 249(H) 303(H) 399(H)  BUN 6 - 20 mg/dL 8  9 8  Creatinine 0.44 - 1.00 mg/dL 0.40(L) 0.47 0.51  Sodium 135 - 145 mmol/L 138 135 137  Potassium 3.5 - 5.1 mmol/L 4.1 3.8 3.4(L)  Chloride 98 - 111 mmol/L 105 102 104  CO2 22 - 32 mmol/L 22 22 24   Calcium 8.9 - 10.3 mg/dL 8.7(L) 9.1 8.9     Discharge Instructions: Joann Meyers  You came to [place] with symptoms of facial twitching and left sided weakness. Your symptoms have resolved. Our neurology team has done some tests to see if you are having a stroke but these tests have been negative and we believe you have had a transient ischemic event where where blood flow to the brain was  limited for a short time. Here are our recommendations at discharge:  - Start taking a baby aspirin every day - Start atorvastatin 80mg  daily - Start clopidogrel 75mg  daily for 3 weeks (until 10/13/18) and then stop - Please follow up with your neurologist and your primary care provider  Thank you for choosing [facility]  Discharge Instructions    Call MD for:  difficulty breathing, headache or visual disturbances   Complete by:  As directed    Call MD for:  persistant dizziness or light-headedness   Complete by:  As directed    Call MD for:  persistant nausea and vomiting   Complete by:  As directed    Call MD for:  severe uncontrolled pain   Complete by:  As directed    Diet - low sodium heart healthy   Complete by:  As directed    Increase activity slowly   Complete by:  As directed      Signed: Mosetta Anis, MD 09/23/2018, 5:03 PM   Pager: [phone]

## 2018-09-22 NOTE — Progress Notes (Signed)
OT Cancellation Note  Patient Details Name: Joann Meyers MRN: [id number] DOB: Oct 16, 1986   Cancelled Treatment:    Reason Eval/Treat Not Completed: OT screened, no needs identified, will sign off; noted Pt independent when working with PT this AM, spoke with RN and RN also confirming pt performing at overall independent level, appears to be at her baseline with ADLs and mobility, expecting discharge soon. Acute OT to sign off at this time, thank you for this referral. Please re-consult if needs change.   Lou Cal, OT [facility] Pager [phone] Office [phone]   Raymondo Band 09/22/2018, 1:34 PM

## 2018-09-22 NOTE — Progress Notes (Signed)
Physical Therapy Evaluation Patient Details Name: Joann Meyers MRN: [id number] DOB: 1986-02-25 Today's Date: 09/22/2018   History of Present Illness  Patient presented to the hospital with right sided weakness of her UE and left eye twitching. She reports her symptoms have resolved  Clinical Impression  Patient appears to be at baseline mobility. She appears to be a bit impulsive but that may be baseline. She was able to stand on 1 foot and put her sock on despite recommendation that she sit. She had no LOB with high level gait and balance exercises. She has no need for further PT follow up.     Follow Up Recommendations No PT follow up    Equipment Recommendations       Recommendations for Other Services       Precautions / Restrictions Precautions Precautions: None Restrictions Weight Bearing Restrictions: No      Mobility  Bed Mobility Overal bed mobility: Independent             General bed mobility comments: no aissit required   Transfers Overall transfer level: Independent               General transfer comment: no syncope or assist required   Ambulation/Gait Ambulation/Gait assistance: Independent Gait Distance (Feet): 30 Feet     Gait velocity: normal gai velocity    General Gait Details: Patient asked to change speed, stop quickly, turn her head, and nod her head, No LOB with any activity.   Stairs            Wheelchair Mobility    Modified Rankin (Stroke Patients Only) Modified Rankin (Stroke Patients Only) Pre-Morbid Rankin Score: No symptoms Modified Rankin: No symptoms     Balance Overall balance assessment: Independent(No LOB with any activity )                                           Pertinent Vitals/Pain Pain Assessment: No/denies pain    Home Living Family/patient expects to be discharged to:: Private residence Living Arrangements: Parent Available Help at Discharge: Family;Friend(s);Available  24 hours/day Type of Home: House                Prior Function Level of Independence: Independent               Hand Dominance   Dominant Hand: Right    Extremity/Trunk Assessment   Upper Extremity Assessment Upper Extremity Assessment: Overall WFL for tasks assessed    Lower Extremity Assessment Lower Extremity Assessment: Overall WFL for tasks assessed       Communication   Communication: No difficulties  Cognition   Behavior During Therapy: Impulsive                                   General Comments: asked to put her sock on. Patient stood and tried to do on 1 foot and nearly pulled her IV out. Nursing notified       General Comments      Exercises     Assessment/Plan    PT Assessment Patent does not need any further PT services  PT Problem List         PT Treatment Interventions      PT Goals (Current goals can be found in the Care Plan section)  Acute Rehab PT Goals Patient Stated Goal: to go home  PT Goal Formulation: With patient Time For Goal Achievement: 09/29/18 Potential to Achieve Goals: Good    Frequency     Barriers to discharge        Co-evaluation               AM-PAC PT "6 Clicks" Mobility  Outcome Measure Help needed turning from your back to your side while in a flat bed without using bedrails?: None Help needed moving from lying on your back to sitting on the side of a flat bed without using bedrails?: None Help needed moving to and from a bed to a chair (including a wheelchair)?: None Help needed standing up from a chair using your arms (e.g., wheelchair or bedside chair)?: None Help needed to walk in hospital room?: None Help needed climbing 3-5 steps with a railing? : None 6 Click Score: 24    End of Session Equipment Utilized During Treatment: Gait belt Activity Tolerance: Patient tolerated treatment well Patient left: in bed;with call bell/phone within reach;with nursing/sitter in  room Nurse Communication: Mobility status;Other (comment)(IV status ) PT Visit Diagnosis: Unsteadiness on feet (R26.81)    Time: 1030-1045 PT Time Calculation (min) (ACUTE ONLY): 15 min   Charges:   PT Evaluation $PT Eval Low Complexity: 1 Low            Carney Living 09/22/2018, 10:53 AM
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
## 2018-09-22 NOTE — Progress Notes (Signed)
Pt stable, ambulatory, and verbalizes understanding of d/c instructions.  

## 2018-09-22 NOTE — Discharge Instructions (Signed)
Joann Meyers  You came to [place] with symptoms of facial twitching and left sided weakness. Your symptoms have resolved. Our neurology team has done some tests to see if you are having a stroke but these tests have been negative and we believe you have had a transient ischemic event where where blood flow to the brain was limited for a short time. Here are our recommendations at discharge:  - Start taking a baby aspirin every day - Start atorvastatin 80mg  daily - Start clopidogrel 75mg  daily for 3 weeks (until 10/13/18) and then stop - Please follow up with your neurologist and your primary care provider  Thank you for choosing [place]   Transient Ischemic Attack A transient ischemic attack (TIA) is a "warning stroke" that causes stroke-like symptoms. A TIA does not cause lasting damage to the brain. The symptoms of a TIA can happen fast and do not last long. It is important to know the symptoms of a TIA and what to do. This can help prevent stroke or death. Follow these instructions at home:  Take medicines only as told by your doctor. Make sure you understand all of the instructions.  You may need to take aspirin or warfarin medicine. Warfarin needs to be taken exactly as told. ? Taking too much or too little warfarin is dangerous. Blood tests must be done as often as told by your doctor. A PT blood test measures how long it takes for blood to clot. Your PT is used to calculate another value called an INR. Your PT and INR help your doctor adjust your warfarin dosage. He or she will make sure you are taking the right amount. ? Food can cause problems with warfarin and affect the results of your blood tests. This is true for foods high in vitamin K. Eat the same amount of foods high in vitamin K each day. Foods high in vitamin K include spinach, kale, broccoli, cabbage, collard and turnip greens, Brussels sprouts, peas, cauliflower, seaweed, and parsley. Other foods high in vitamin K include beef  and pork liver, green tea, and soybean oil. Eat the same amount of foods high in vitamin K each day. Avoid big changes in your diet. Tell your doctor before changing your diet. Talk to a food specialist (dietitian) if you have questions. ? Many medicines can cause problems with warfarin and affect your PT and INR. Tell your doctor about all medicines you take. This includes vitamins and dietary pills (supplements). Do not take or stop taking any prescribed or over-the-counter medicines unless your doctor tells you to. ? Warfarin can cause more bruising or bleeding. Hold pressure over any cuts for longer than normal. Talk to your doctor about other side effects of warfarin. ? Avoid sports or activities that may cause injury or bleeding. ? Be careful when you shave, floss, or use sharp objects. ? Avoid or drink very little alcohol while taking warfarin. Tell your doctor if you change how much alcohol you drink. ? Tell your dentist and other doctors that you take warfarin before any procedures.  Follow your diet program as told, if you are given one.  Keep a healthy weight.  Stay active. Try to get at least 30 minutes of activity on all or most days.  Do not use any tobacco products, including cigarettes, chewing tobacco, or electronic cigarettes. If you need help quitting, ask your doctor.  Limit alcohol intake to no more than 1 drink per day for nonpregnant women and 2 drinks per  day for men. One drink equals 12 ounces of beer, 5 ounces of wine, or 1 ounces of hard liquor.  Do not abuse drugs.  Keep your home safe so you do not fall. You can do this by: ? Putting grab bars in the bedroom and bathroom. ? Raising toilet seats. ? Putting a seat in the shower.  Keep all follow-up visits as told by your doctor. This is important. Contact a doctor if:  Your personality changes.  You have trouble swallowing.  You have double vision.  You are dizzy.  You have a fever. Get help right away  if: These symptoms may be an emergency. Do not wait to see if the symptoms will go away. Get medical help right away. Call your local emergency services (911 in the [place] Do not drive yourself to the hospital.  You have sudden weakness or lose feeling (go numb), especially on one side of the body. This can affect your: ? Face. ? Arm. ? Leg.  You have sudden trouble walking.  You have sudden trouble moving your arms or legs.  You have sudden confusion.  You have trouble talking.  You have trouble understanding.  You have sudden trouble seeing in one or both eyes.  You lose your balance.  Your movements are not smooth.  You have a sudden, very bad headache with no known cause.  You have new chest pain.  Your heartbeat is unsteady.  You are partly or totally unaware of what is going on around you.  This information is not intended to replace advice given to you by your health care provider. Make sure you discuss any questions you have with your health care provider. Document Released: 07/11/2008 Document Revised: 06/05/2016 Document Reviewed: 01/07/2014 Elsevier Interactive Patient Education  [employer].

## 2018-09-22 NOTE — Progress Notes (Addendum)
Subjective:  Joann Meyers is a 32 y.o. with PMH of CVA, uncontrolled T2DM, Obesity admit for left sided weakness on hospital day 0  JoannMeyers was examined and evaluated at bedside this AM. She states her left sided weakness and facial twitching has not reoccured overnight. She was signed up for SleepSmart stroke prevention study and had sleep study performed overnight. She states she feels great and would like to leave soon. When inquired about her prior history of poor follow up, she mentions that she did not have insurance at the time but currently does have insurance and should have no issues following up. Her abdominal cramps due to her period has significantly improved. Denies any F/N/V/D/C  Objective:  Vital signs in last 24 hours: Vitals:   09/21/18 0402 09/21/18 0618 09/21/18 1511 09/21/18 1910  BP:  (!) 142/75 131/75 140/89  Pulse:  83 87 87  Resp:  17 12 14   Temp:  97.7 F (36.5 C) 98 F (36.7 C) 98.6 F (37 C)  TempSrc:  Oral Oral Oral  SpO2:  100% 97% 98%  Weight: 90.7 kg     Height: 5\' 4"  (1.626 m)      Physical Exam  Constitutional: She is well-developed, well-nourished, and in no distress. No distress.  HENT:  Head: Normocephalic and atraumatic.  Mouth/Throat: Oropharynx is clear and moist. No oropharyngeal exudate.  Poor dentition  Eyes: Pupils are equal, round, and reactive to light. Conjunctivae and EOM are normal. No scleral icterus.  Neck: Normal range of motion. Neck supple. No JVD present.  Cardiovascular: Normal rate, regular rhythm, normal heart sounds and intact distal pulses.  Pulmonary/Chest: Effort normal and breath sounds normal. She has no rales.  Abdominal: Soft. Bowel sounds are normal. There is no tenderness. There is no guarding.  Musculoskeletal: Normal range of motion. She exhibits no edema.  Neurological:  Neurologic exam: Mental status: A&Ox3 Cranial Nerves: II: PERRL III, IV, VI: Extra-occular motions  intact bilaterally V, VII: Face symmetric, sensation intact in all 3 divisions  VIII: hearing normal to rubbing fingers bilaterally  IX, X: palate rises symmetrically XI: Head turn and shoulder shrug normal bilaterally  XII: tongue midline  Motor: Strength 5/5 on all upper and lower extremities, bulk muscle and tone are normal Sensory: Light touch intact and symmetric bilaterally  Psychiatric: Normal mood and affect  Skin: Skin is warm and dry. She is not diaphoretic.  Psychiatric: Mood, memory, affect and judgment normal.   Assessment/Plan:  Principal Problem:   Left-sided weakness Active Problems:   History of stroke   Type 2 diabetes mellitus (HCC)   Microcytic anemia   TIA (transient ischemic attack)   Thyroid nodule  JoannEarnest Meyers is a 32 yo F w/ PMH of CVA, uncontrolled T2DM, Obesity admit for facial twitching and left sided weakness. Her neurological symptoms have resolved and we are awaiting Echo and EEG but she will most likely be discharged today.  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 CT head negative for bleed. CTA show right ICA occlusion consistent with prior CTA. MRI show no acute intracranial infarct. Hgb A1c 8.2 LDL 74.  - Appreciate neuro recs - Started on DAPT per neuro w/ asa and clopidogrel (for 3 wks then asa alone) - C/w Atorvastatin 80mg  daily - F/u TTE, Routine EEG - c/w telemetry  - Discharge after above work up is completed  T2DM Fasting this AM 228. Hgb A1c 8.2 On metformin at home no insulin -  Lantus 7 units  - Glucose checks - Sliding scale insulin  DVT prophx: Lovenox Diet: Diabetic Code: Full  Dispo: Anticipated discharge in approximately today(s).   Joann Anis, MD 09/22/2018, 6:56 AM Pager: [phone]

## 2018-09-22 NOTE — Progress Notes (Signed)
  Echocardiogram 2D Echocardiogram has been performed.  Merrie Roof F 09/22/2018, 1:17 PM

## 2018-09-25 ENCOUNTER — Ambulatory Visit: Payer: BLUE CROSS/BLUE SHIELD

## 2018-09-25 ENCOUNTER — Encounter: Payer: Self-pay | Admitting: Internal Medicine

## 2018-10-21 ENCOUNTER — Encounter: Payer: Self-pay | Admitting: Internal Medicine

## 2018-10-22 ENCOUNTER — Encounter: Payer: Self-pay | Admitting: Internal Medicine

## 2018-10-23 ENCOUNTER — Ambulatory Visit: Payer: BLUE CROSS/BLUE SHIELD | Admitting: *Deleted

## 2018-10-25 ENCOUNTER — Other Ambulatory Visit: Payer: Self-pay | Admitting: Family Medicine

## 2018-10-25 DIAGNOSIS — E041 Nontoxic single thyroid nodule: Secondary | ICD-10-CM

## 2018-10-29 ENCOUNTER — Encounter: Payer: Self-pay | Admitting: Internal Medicine

## 2018-10-30 ENCOUNTER — Ambulatory Visit
Admission: RE | Admit: 2018-10-30 | Discharge: 2018-10-30 | Disposition: A | Discharge status: Home / Self Care | Payer: BLUE CROSS/BLUE SHIELD | Source: Ambulatory Visit | Attending: Family Medicine | Admitting: Family Medicine

## 2018-10-30 DIAGNOSIS — E041 Nontoxic single thyroid nodule: Secondary | ICD-10-CM

## 2018-10-31 ENCOUNTER — Other Ambulatory Visit: Payer: Self-pay | Admitting: Family Medicine

## 2018-10-31 DIAGNOSIS — E041 Nontoxic single thyroid nodule: Secondary | ICD-10-CM

## 2018-11-05 ENCOUNTER — Other Ambulatory Visit (HOSPITAL_COMMUNITY)
Admission: RE | Admit: 2018-11-05 | Discharge: 2018-11-05 | Disposition: A | Discharge status: Home / Self Care | Payer: BLUE CROSS/BLUE SHIELD | Source: Ambulatory Visit | Attending: Physician Assistant | Admitting: Physician Assistant

## 2018-11-05 ENCOUNTER — Ambulatory Visit
Admission: RE | Admit: 2018-11-05 | Discharge: 2018-11-05 | Disposition: A | Discharge status: Home / Self Care | Payer: BLUE CROSS/BLUE SHIELD | Source: Ambulatory Visit | Attending: Family Medicine | Admitting: Family Medicine

## 2018-11-05 DIAGNOSIS — D440 Neoplasm of uncertain behavior of thyroid gland: Secondary | ICD-10-CM | POA: Insufficient documentation

## 2018-11-05 DIAGNOSIS — E041 Nontoxic single thyroid nodule: Secondary | ICD-10-CM

## 2018-11-05 NOTE — Procedures (Signed)
PROCEDURE SUMMARY:  Using direct ultrasound guidance, 4 passes were made using 25 g needles into the nodule within the right lobe of the thyroid.   Ultrasound was used to confirm needle placements on all occasions.   EBL = trace  Specimens were sent to Pathology for analysis.  See procedure note under Imaging tab in Epic for full procedure details.  Bryella Diviney S Julien Berryman PA-C 11/05/2018 3:55 PM

## 2018-11-08 ENCOUNTER — Encounter (HOSPITAL_COMMUNITY): Payer: Self-pay

## 2018-11-08 ENCOUNTER — Emergency Department (HOSPITAL_COMMUNITY): Payer: BLUE CROSS/BLUE SHIELD

## 2018-11-08 ENCOUNTER — Emergency Department (HOSPITAL_COMMUNITY)
Admission: EM | Admit: 2018-11-08 | Discharge: 2018-11-08 | Disposition: A | Discharge status: Home / Self Care | Payer: BLUE CROSS/BLUE SHIELD | Attending: Emergency Medicine | Admitting: Emergency Medicine

## 2018-11-08 DIAGNOSIS — R079 Chest pain, unspecified: Secondary | ICD-10-CM

## 2018-11-08 DIAGNOSIS — Z79899 Other long term (current) drug therapy: Secondary | ICD-10-CM | POA: Diagnosis not present

## 2018-11-08 DIAGNOSIS — Z7982 Long term (current) use of aspirin: Secondary | ICD-10-CM | POA: Insufficient documentation

## 2018-11-08 DIAGNOSIS — E119 Type 2 diabetes mellitus without complications: Secondary | ICD-10-CM | POA: Diagnosis not present

## 2018-11-08 LAB — CBC
HCT: 32.6 % — ABNORMAL LOW (ref 36.0–46.0)
Hemoglobin: 10.4 g/dL — ABNORMAL LOW (ref 12.0–15.0)
MCH: 24.5 pg — ABNORMAL LOW (ref 26.0–34.0)
MCHC: 31.9 g/dL (ref 30.0–36.0)
MCV: 76.7 fL — ABNORMAL LOW (ref 80.0–100.0)
Platelets: 210 10*3/uL (ref 150–400)
RBC: 4.25 MIL/uL (ref 3.87–5.11)
RDW: 15.8 % — ABNORMAL HIGH (ref 11.5–15.5)
WBC: 7.3 10*3/uL (ref 4.0–10.5)
nRBC: 0 % (ref 0.0–0.2)

## 2018-11-08 LAB — BASIC METABOLIC PANEL
Anion gap: 9 (ref 5–15)
BUN: 18 mg/dL (ref 6–20)
CO2: 20 mmol/L — ABNORMAL LOW (ref 22–32)
Calcium: 9.1 mg/dL (ref 8.9–10.3)
Chloride: 104 mmol/L (ref 98–111)
Creatinine, Ser: 0.48 mg/dL (ref 0.44–1.00)
GFR calc Af Amer: >60 mL/min (ref >60)
GFR calc non Af Amer: >60 mL/min (ref >60)
Glucose, Bld: 291 mg/dL — ABNORMAL HIGH (ref 70–99)
Potassium: 4.3 mmol/L (ref 3.5–5.1)
Sodium: 133 mmol/L — ABNORMAL LOW (ref 135–145)

## 2018-11-08 LAB — I-STAT TROPONIN, ED: Troponin i, poc: 0 ng/mL (ref 0.00–0.08)

## 2018-11-08 MED ORDER — SODIUM CHLORIDE 0.9% FLUSH: 3.0000 mL | Freq: Once | INTRAVENOUS | Status: DC

## 2018-11-08 NOTE — ED Notes (Signed)
ED Provider at bedside. 
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
## 2018-11-12 ENCOUNTER — Emergency Department (HOSPITAL_COMMUNITY)
Admission: EM | Admit: 2018-11-12 | Discharge: 2018-11-12 | Disposition: A | Discharge status: Home / Self Care | Payer: BLUE CROSS/BLUE SHIELD | Attending: Emergency Medicine | Admitting: Emergency Medicine

## 2018-11-12 ENCOUNTER — Encounter (HOSPITAL_COMMUNITY): Payer: Self-pay | Admitting: *Deleted

## 2018-11-12 DIAGNOSIS — Z8673 Personal history of transient ischemic attack (TIA), and cerebral infarction without residual deficits: Secondary | ICD-10-CM | POA: Insufficient documentation

## 2018-11-12 DIAGNOSIS — Z7982 Long term (current) use of aspirin: Secondary | ICD-10-CM | POA: Diagnosis not present

## 2018-11-12 DIAGNOSIS — R51 Headache: Secondary | ICD-10-CM | POA: Diagnosis present

## 2018-11-12 DIAGNOSIS — R519 Headache, unspecified: Secondary | ICD-10-CM

## 2018-11-12 DIAGNOSIS — E119 Type 2 diabetes mellitus without complications: Secondary | ICD-10-CM | POA: Diagnosis not present

## 2018-11-12 DIAGNOSIS — Z79899 Other long term (current) drug therapy: Secondary | ICD-10-CM | POA: Insufficient documentation

## 2018-11-12 MED ORDER — DIPHENHYDRAMINE HCL 25 MG PO CAPS
25.0000 mg | ORAL_CAPSULE | Freq: Once | ORAL | Status: AC
  Administered 2018-11-12: 25 mg via ORAL
  Filled 2018-11-12: qty 1

## 2018-11-12 MED ORDER — KETOROLAC TROMETHAMINE 30 MG/ML IJ SOLN
30.0000 mg | Freq: Once | INTRAMUSCULAR | Status: AC
  Administered 2018-11-12: 30 mg via INTRAMUSCULAR
  Filled 2018-11-12: qty 1

## 2018-11-12 MED ORDER — METOCLOPRAMIDE HCL 10 MG PO TABS
5.0000 mg | ORAL_TABLET | Freq: Once | ORAL | Status: AC
  Administered 2018-11-12: 5 mg via ORAL
  Filled 2018-11-12: qty 1

## 2018-11-12 NOTE — Discharge Instructions (Addendum)
Please read attached information. If you experience any new or worsening signs or symptoms please return to the emergency room for evaluation. Please follow-up with your primary care provider or specialist as discussed.  °

## 2018-11-12 NOTE — ED Notes (Signed)
Patient verbalizes understanding of discharge instructions. Opportunity for questioning and answers were provided. 
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
## 2018-11-12 NOTE — ED Triage Notes (Signed)
To ED for eval of HA 'for some days'. States pain was relieved by ibuprofen but that stopped yesterday. Pt states she had to call EMS last night because of the pain and feeling like her face was numb. EMS told her that her BP was high but pt didn't want to come to the hospital. Denies facial numbness at this time. No neuro deficits noted. No vomiting. 6 ibuprofen taken 0400.

## 2018-11-13 ENCOUNTER — Ambulatory Visit: Payer: BLUE CROSS/BLUE SHIELD | Admitting: Registered"

## 2018-11-22 ENCOUNTER — Other Ambulatory Visit: Payer: Self-pay

## 2018-11-22 ENCOUNTER — Encounter (HOSPITAL_COMMUNITY): Payer: Self-pay | Admitting: Emergency Medicine

## 2018-11-22 ENCOUNTER — Emergency Department (HOSPITAL_COMMUNITY)
Admission: EM | Admit: 2018-11-22 | Discharge: 2018-11-22 | Disposition: A | Discharge status: Home / Self Care | Payer: BLUE CROSS/BLUE SHIELD | Attending: Emergency Medicine | Admitting: Emergency Medicine

## 2018-11-22 DIAGNOSIS — Z7984 Long term (current) use of oral hypoglycemic drugs: Secondary | ICD-10-CM | POA: Diagnosis not present

## 2018-11-22 DIAGNOSIS — R519 Headache, unspecified: Secondary | ICD-10-CM

## 2018-11-22 DIAGNOSIS — E119 Type 2 diabetes mellitus without complications: Secondary | ICD-10-CM | POA: Diagnosis not present

## 2018-11-22 DIAGNOSIS — Z7982 Long term (current) use of aspirin: Secondary | ICD-10-CM | POA: Diagnosis not present

## 2018-11-22 DIAGNOSIS — R51 Headache: Secondary | ICD-10-CM | POA: Insufficient documentation

## 2018-11-22 NOTE — ED Notes (Signed)
Patient verbalizes understanding of discharge instructions. Opportunity for questioning and answers were provided. Armband removed by staff, pt discharged from ED ambulatory.   

## 2018-11-22 NOTE — Discharge Instructions (Addendum)
You have been diagnosed today with Headache.   At this time there does not appear to be the presence of an emergent medical condition, however there is always the potential for conditions to change. Please read and follow the below instructions.  Please return to the Emergency Department immediately for any new or worsening symptoms or if your symptoms return. Please be sure to follow up with your Primary Care Provider this week regarding your visit today; please call their office to schedule an appointment even if you are feeling better for a follow-up visit. You may follow-up with the specialist [facility] for further evaluation of your headaches.  Get help right away if: Your headache becomes severe quickly. Your headache gets worse after moderate to intense physical activity. You have repeated vomiting. You have a stiff neck. You have a loss of vision. You have problems with speech. You have pain in the eye or ear. You have muscular weakness or loss of muscle control. You lose your balance or have trouble walking. You feel faint or pass out. You have confusion. You have a seizure. You have fever.  Please read the additional information packets attached to your discharge summary.  Do not take your medicine if  develop an itchy rash, swelling in your mouth or lips, or difficulty breathing.

## 2018-11-22 NOTE — ED Triage Notes (Signed)
C/O of intermitted head aches since Wednesday. Reports taking Tylenol, Ibuprofen, and Aleve without relief. A&O X 4 with neuro intact.
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
## 2018-12-09 ENCOUNTER — Encounter (HOSPITAL_COMMUNITY): Payer: Self-pay | Admitting: *Deleted

## 2018-12-09 ENCOUNTER — Other Ambulatory Visit: Payer: Self-pay

## 2018-12-09 ENCOUNTER — Emergency Department (HOSPITAL_COMMUNITY)
Admission: EM | Admit: 2018-12-09 | Discharge: 2018-12-09 | Disposition: A | Discharge status: Home / Self Care | Payer: BLUE CROSS/BLUE SHIELD | Attending: Emergency Medicine | Admitting: Emergency Medicine

## 2018-12-09 DIAGNOSIS — M545 Low back pain: Secondary | ICD-10-CM | POA: Insufficient documentation

## 2018-12-09 DIAGNOSIS — IMO0002 Reserved for concepts with insufficient information to code with codable children: Secondary | ICD-10-CM

## 2018-12-09 DIAGNOSIS — Z7982 Long term (current) use of aspirin: Secondary | ICD-10-CM | POA: Insufficient documentation

## 2018-12-09 DIAGNOSIS — Z79899 Other long term (current) drug therapy: Secondary | ICD-10-CM | POA: Diagnosis not present

## 2018-12-09 DIAGNOSIS — E119 Type 2 diabetes mellitus without complications: Secondary | ICD-10-CM | POA: Diagnosis not present

## 2018-12-09 DIAGNOSIS — R51 Headache: Secondary | ICD-10-CM | POA: Diagnosis present

## 2018-12-09 DIAGNOSIS — Z7984 Long term (current) use of oral hypoglycemic drugs: Secondary | ICD-10-CM | POA: Insufficient documentation

## 2018-12-09 DIAGNOSIS — G8929 Other chronic pain: Secondary | ICD-10-CM | POA: Diagnosis not present

## 2018-12-09 DIAGNOSIS — G43709 Chronic migraine without aura, not intractable, without status migrainosus: Secondary | ICD-10-CM | POA: Insufficient documentation

## 2018-12-09 MED ORDER — NAPROXEN 500 MG PO TABS: 500.0000 mg | ORAL_TABLET | Freq: Two times a day (BID) | ORAL | 0 refills | Status: DC

## 2018-12-09 NOTE — ED Notes (Signed)
Patient verbalizes understanding of discharge instructions. Opportunity for questioning and answers were provided. Armband removed by staff, pt discharged from ED. Ambulated out to lobby  
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
## 2018-12-09 NOTE — Discharge Instructions (Signed)
You were seen in the ER for headache and back pain.  Follow-up with [facility] for long-term management of your chronic headaches.  Return to the ER if there is any stroke symptoms such as vision changes or loss, difficulty with speech or balance, left-sided loss of sensation or weakness.  Also return for fever, neck stiffness or pain, generalized rash.  I suspect your back pain is muscular overuse related.  Apply heat.  Take naproxen as prescribed.  Return to the ER if there is any abdominal pain, groin numbness, loss of sensation or strength in your extremities, loss of bladder or bowel control, urinary symptoms.

## 2018-12-09 NOTE — ED Triage Notes (Signed)
Pt reports having a headache and lower back pain since yesterday, hx of same. Denies new injury to back but woke up with stabbing pain. Denies n/v.

## 2019-01-03 ENCOUNTER — Emergency Department (HOSPITAL_COMMUNITY)
Admission: EM | Admit: 2019-01-03 | Discharge: 2019-01-03 | Disposition: A | Discharge status: Home / Self Care | Payer: BLUE CROSS/BLUE SHIELD | Attending: Emergency Medicine | Admitting: Emergency Medicine

## 2019-01-03 ENCOUNTER — Other Ambulatory Visit: Payer: Self-pay

## 2019-01-03 DIAGNOSIS — R51 Headache: Secondary | ICD-10-CM | POA: Insufficient documentation

## 2019-01-03 DIAGNOSIS — Z79899 Other long term (current) drug therapy: Secondary | ICD-10-CM | POA: Insufficient documentation

## 2019-01-03 DIAGNOSIS — Z7982 Long term (current) use of aspirin: Secondary | ICD-10-CM | POA: Diagnosis not present

## 2019-01-03 DIAGNOSIS — E119 Type 2 diabetes mellitus without complications: Secondary | ICD-10-CM | POA: Insufficient documentation

## 2019-01-03 DIAGNOSIS — Z8673 Personal history of transient ischemic attack (TIA), and cerebral infarction without residual deficits: Secondary | ICD-10-CM | POA: Diagnosis not present

## 2019-01-03 DIAGNOSIS — Z7984 Long term (current) use of oral hypoglycemic drugs: Secondary | ICD-10-CM | POA: Diagnosis not present

## 2019-01-03 DIAGNOSIS — R519 Headache, unspecified: Secondary | ICD-10-CM

## 2019-01-03 MED ORDER — BUTALBITAL-APAP-CAFFEINE 50-325-40 MG PO TABS
1.0000 | ORAL_TABLET | Freq: Once | ORAL | Status: AC
  Administered 2019-01-03: 1 via ORAL
  Filled 2019-01-03: qty 1

## 2019-01-03 NOTE — ED Notes (Signed)
Patient discharged from facility. Verbalized understanding. No signature pad available.

## 2019-01-03 NOTE — ED Triage Notes (Signed)
Pt stated she has been having migraines for a week. Worsened last night around 1800. Took 4 ibuprofen over the course of the night. Blurred vision, 10/10 pain, dizziness, photosensitivity. Hx of migraines. Takes ibuprofen to make it better.
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
## 2019-01-03 NOTE — Discharge Instructions (Signed)
Home to rest, follow up with primary care, referral given.

## 2019-01-13 ENCOUNTER — Other Ambulatory Visit: Payer: Self-pay

## 2019-01-13 ENCOUNTER — Emergency Department (HOSPITAL_COMMUNITY)
Admission: EM | Admit: 2019-01-13 | Discharge: 2019-01-13 | Disposition: A | Discharge status: Home / Self Care | Payer: BLUE CROSS/BLUE SHIELD | Attending: Emergency Medicine | Admitting: Emergency Medicine

## 2019-01-13 ENCOUNTER — Encounter (HOSPITAL_COMMUNITY): Payer: Self-pay | Admitting: Student

## 2019-01-13 ENCOUNTER — Emergency Department (HOSPITAL_COMMUNITY): Payer: BLUE CROSS/BLUE SHIELD

## 2019-01-13 DIAGNOSIS — Z8673 Personal history of transient ischemic attack (TIA), and cerebral infarction without residual deficits: Secondary | ICD-10-CM | POA: Insufficient documentation

## 2019-01-13 DIAGNOSIS — R519 Headache, unspecified: Secondary | ICD-10-CM

## 2019-01-13 DIAGNOSIS — Z79899 Other long term (current) drug therapy: Secondary | ICD-10-CM | POA: Diagnosis not present

## 2019-01-13 DIAGNOSIS — J069 Acute upper respiratory infection, unspecified: Secondary | ICD-10-CM | POA: Diagnosis not present

## 2019-01-13 DIAGNOSIS — Z7982 Long term (current) use of aspirin: Secondary | ICD-10-CM | POA: Diagnosis not present

## 2019-01-13 DIAGNOSIS — E119 Type 2 diabetes mellitus without complications: Secondary | ICD-10-CM | POA: Diagnosis not present

## 2019-01-13 DIAGNOSIS — R51 Headache: Secondary | ICD-10-CM | POA: Diagnosis present

## 2019-01-13 DIAGNOSIS — Z7984 Long term (current) use of oral hypoglycemic drugs: Secondary | ICD-10-CM | POA: Diagnosis not present

## 2019-01-13 LAB — I-STAT TROPONIN, ED: Troponin i, poc: 0 ng/mL (ref 0.00–0.08)

## 2019-01-13 LAB — I-STAT BETA HCG BLOOD, ED (MC, WL, AP ONLY): I-stat hCG, quantitative: <5 m[IU]/mL (ref <5)

## 2019-01-13 LAB — INFLUENZA PANEL BY PCR (TYPE A & B)
Influenza A By PCR: NEGATIVE
Influenza B By PCR: NEGATIVE

## 2019-01-13 LAB — GROUP A STREP BY PCR: Group A Strep by PCR: NOT DETECTED

## 2019-01-13 MED ORDER — NAPROXEN 500 MG PO TABS: 500.0000 mg | ORAL_TABLET | Freq: Two times a day (BID) | ORAL | 0 refills | Status: DC

## 2019-01-13 MED ORDER — SODIUM CHLORIDE 0.9 % IV BOLUS
1000.0000 mL | Freq: Once | INTRAVENOUS | Status: AC
  Administered 2019-01-13: 1000 mL via INTRAVENOUS

## 2019-01-13 MED ORDER — FLUTICASONE PROPIONATE 50 MCG/ACT NA SUSP: 1.0000 | Freq: Every day | NASAL | 0 refills | Status: DC

## 2019-01-13 MED ORDER — SODIUM CHLORIDE 0.9 % IV SOLN: INTRAVENOUS | Status: DC

## 2019-01-13 MED ORDER — DIPHENHYDRAMINE HCL 50 MG/ML IJ SOLN
12.5000 mg | Freq: Once | INTRAMUSCULAR | Status: AC
  Administered 2019-01-13: 12.5 mg via INTRAVENOUS
  Filled 2019-01-13: qty 1

## 2019-01-13 MED ORDER — PROCHLORPERAZINE EDISYLATE 10 MG/2ML IJ SOLN
10.0000 mg | Freq: Once | INTRAMUSCULAR | Status: AC
  Administered 2019-01-13: 10 mg via INTRAVENOUS
  Filled 2019-01-13: qty 2

## 2019-01-13 MED ORDER — BENZONATATE 100 MG PO CAPS: 100.0000 mg | ORAL_CAPSULE | Freq: Three times a day (TID) | ORAL | 0 refills | Status: DC | PRN

## 2019-01-13 NOTE — Discharge Instructions (Addendum)
You were seen in the emergency today for upper respiratory symptoms and headache. Your strep was negative. Your chest xray was normal. We suspect your symptoms are viral in nature.  I have prescribed you multiple medications to treat your symptoms.   -Flonase to be used 1 spray in each nostril daily.  This medication is used to treat your congestion.  -Tessalon can be taken once every 8 hours as needed.  This medication is used to treat your cough.  - Naproxen is a nonsteroidal anti-inflammatory medication that will help with pain and swelling. Be sure to take this medication as prescribed with food, 1 pill every 12 hours,  It should be taken with food, as it can cause stomach upset, and more seriously, stomach bleeding. Do not take other nonsteroidal anti-inflammatory medications with this such as Advil, Motrin, Aleve, Mobic, Goodie Powder, or Motrin.    You make take Tylenol per over the counter dosing with these medications.   We have prescribed you new medication(s) today. Discuss the medications prescribed today with your pharmacist as they can have adverse effects and interactions with your other medicines including over the counter and prescribed medications. Seek medical evaluation if you start to experience new or abnormal symptoms after taking one of these medicines, seek care immediately if you start to experience difficulty breathing, feeling of your throat closing, facial swelling, or rash as these could be indications of a more serious allergic reaction   We have limited coronavirus testing at this time. We are instructing patient's with cough to quarantine themselves for 14 days. You may be able to discontinue self quarantine if the following conditions are met:   Persons with COVID-19 who have symptoms and were directed to care for themselves at home may discontinue home isolation under the  following conditions: - It has been at least 7 days have passed since symptoms first  appeared. - AND at least 3 days (72 hours) have passed since recovery defined as resolution of fever without the use of fever-reducing medications and improvement in respiratory symptoms (e.g., cough, shortness of breath)  Please follow the below quarantine instructions.   Please follow up with primary care within 3-5 days for re-evaluation- call prior to going to the office to make them aware of your symptoms. Return to the ER for new or worsening symptoms including but not limited to increased work of breathing, fever, chest pain, passing out, or any other concerns.    Stay home except to get medical care You should restrict activities outside your home, except for getting medical care. Do not go to work, school, or public areas, and do not use public transportation or taxis.  Call ahead before visiting your doctor Before your medical appointment, call the healthcare provider and tell them that you have, or are being evaluated for, COVID-19 infection. This will help the healthcare providers office take steps to keep other people from getting infected. Ask your healthcare provider to call the local or state health department.  Monitor your symptoms Seek prompt medical attention if your illness is worsening (e.g., difficulty breathing). Before going to your medical appointment, call the healthcare provider and tell them that you have, or are being evaluated for, COVID-19 infection. Ask your healthcare provider to call the local or state health department.  Wear a facemask You should wear a facemask that covers your nose and mouth when you are in the same room with other people and when you visit a healthcare provider. People who live  with or visit you should also wear a facemask while they are in the same room with you.  Separate yourself from other people in your home As much as possible, you should stay in a different room from other people in your home. Also, you should use a  separate bathroom, if available.  Avoid sharing household items You should not share dishes, drinking glasses, cups, eating utensils, towels, bedding, or other items with other people in your home. After using these items, you should wash them thoroughly with soap and water.  Cover your coughs and sneezes Cover your mouth and nose with a tissue when you cough or sneeze, or you can cough or sneeze into your sleeve. Throw used tissues in a lined trash can, and immediately wash your hands with soap and water for at least 20 seconds or use an alcohol-based hand rub.  Wash your [facility] your hands often and thoroughly with soap and water for at least 20 seconds. You can use an alcohol-based hand sanitizer if soap and water are not available and if your hands are not visibly dirty. Avoid touching your eyes, nose, and mouth with unwashed hands.   Prevention Steps for Caregivers and Household Members of Individuals Confirmed to have, or Being Evaluated for, COVID-19 Infection Being Cared for in the Home  If you live with, or provide care at home for, a person confirmed to have, or being evaluated for, COVID-19 infection please follow these guidelines to prevent infection:  Follow healthcare providers instructions Make sure that you understand and can help the patient follow any healthcare provider instructions for all care.  Provide for the patients basic needs You should help the patient with basic needs in the home and provide support for getting groceries, prescriptions, and other personal needs.  Monitor the patients symptoms If they are getting sicker, call his or her medical provider and tell them that the patient has, or is being evaluated for, COVID-19 infection. This will help the healthcare providers office take steps to keep other people from getting infected. Ask the healthcare provider to call the local or state health department.  Limit the number of people who have  contact with the patient If possible, have only one caregiver for the patient. Other household members should stay in another home or place of residence. If this is not possible, they should stay in another room, or be separated from the patient as much as possible. Use a separate bathroom, if available. Restrict visitors who do not have an essential need to be in the home.  Keep older adults, very young children, and other sick people away from the patient Keep older adults, very young children, and those who have compromised immune systems or chronic health conditions away from the patient. This includes people with chronic heart, lung, or kidney conditions, diabetes, and cancer.  Ensure good ventilation Make sure that shared spaces in the home have good air flow, such as from an air conditioner or an opened window, weather permitting.  Wash your hands often Wash your hands often and thoroughly with soap and water for at least 20 seconds. You can use an alcohol based hand sanitizer if soap and water are not available and if your hands are not visibly dirty. Avoid touching your eyes, nose, and mouth with unwashed hands. Use disposable paper towels to dry your hands. If not available, use dedicated cloth towels and replace them when they become wet.  Wear a facemask and gloves Wear a  disposable facemask at all times in the room and gloves when you touch or have contact with the patients blood, body fluids, and/or secretions or excretions, such as sweat, saliva, sputum, nasal mucus, vomit, urine, or feces.  Ensure the mask fits over your nose and mouth tightly, and do not touch it during use. Throw out disposable facemasks and gloves after using them. Do not reuse. Wash your hands immediately after removing your facemask and gloves. If your personal clothing becomes contaminated, carefully remove clothing and launder. Wash your hands after handling contaminated clothing. Place all used  disposable facemasks, gloves, and other waste in a lined container before disposing them with other household waste. Remove gloves and wash your hands immediately after handling these items.  Do not share dishes, glasses, or other household items with the patient Avoid sharing household items. You should not share dishes, drinking glasses, cups, eating utensils, towels, bedding, or other items with a patient who is confirmed to have, or being evaluated for, COVID-19 infection. After the person uses these items, you should wash them thoroughly with soap and water.  Wash laundry thoroughly Immediately remove and wash clothes or bedding that have blood, body fluids, and/or secretions or excretions, such as sweat, saliva, sputum, nasal mucus, vomit, urine, or feces, on them. Wear gloves when handling laundry from the patient. Read and follow directions on labels of laundry or clothing items and detergent. In general, wash and dry with the warmest temperatures recommended on the label.  Clean all areas the individual has used often Clean all touchable surfaces, such as counters, tabletops, doorknobs, bathroom fixtures, toilets, phones, keyboards, tablets, and bedside tables, every day. Also, clean any surfaces that may have blood, body fluids, and/or secretions or excretions on them. Wear gloves when cleaning surfaces the patient has come in contact with. Use a diluted bleach solution (e.g., dilute bleach with 1 part bleach and 10 parts water) or a household disinfectant with a label that says EPA-registered for coronaviruses. To make a bleach solution at home, add 1 tablespoon of bleach to 1 quart (4 cups) of water. For a larger supply, add  cup of bleach to 1 gallon (16 cups) of water. Read labels of cleaning products and follow recommendations provided on product labels. Labels contain instructions for safe and effective use of the cleaning product including precautions you should take when applying  the product, such as wearing gloves or eye protection and making sure you have good ventilation during use of the product. Remove gloves and wash hands immediately after cleaning.  Monitor yourself for signs and symptoms of illness Caregivers and household members are considered close contacts, should monitor their health, and will be asked to limit movement outside of the home to the extent possible. Follow the monitoring steps for close contacts listed on the symptom monitoring form.   ? If you have additional questions, contact your local health department or call the epidemiologist on call at [phone] (available 24/7). ? This guidance is subject to change. For the most up-to-date guidance from [facility], please refer to their website: YouBlogs.pl

## 2019-01-13 NOTE — ED Notes (Signed)
Nurse starting IV and will draw labs. 

## 2019-01-13 NOTE — ED Triage Notes (Signed)
Pt presents with c/o headache and sore throat, that began yesterday. N/v yesterday x3. Pt states that she had a fever yesterday of 102. Took 2, 200mg  ibuprofen at 0400 with some relief

## 2019-01-13 NOTE — ED Notes (Signed)
Patient verbalizes understanding of discharge instructions. Opportunity for questioning and answers were provided. Pt discharged from ED. 

## 2019-01-13 NOTE — ED Notes (Signed)
Pt transported to xray 
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
## 2019-02-15 ENCOUNTER — Other Ambulatory Visit: Payer: Self-pay

## 2019-02-15 ENCOUNTER — Emergency Department (HOSPITAL_COMMUNITY)
Admission: EM | Admit: 2019-02-15 | Discharge: 2019-02-15 | Discharge status: Against Medical Advice | Payer: BLUE CROSS/BLUE SHIELD

## 2019-02-15 NOTE — ED Notes (Signed)
Called for triage X 3 with no answer.

## 2019-02-15 NOTE — ED Notes (Signed)
Called for triage no answer in lobby

## 2019-02-25 ENCOUNTER — Other Ambulatory Visit: Payer: Self-pay

## 2019-02-25 ENCOUNTER — Emergency Department (HOSPITAL_COMMUNITY)
Admission: EM | Admit: 2019-02-25 | Discharge: 2019-02-25 | Disposition: A | Discharge status: Home / Self Care | Payer: BLUE CROSS/BLUE SHIELD | Attending: Emergency Medicine | Admitting: Emergency Medicine

## 2019-02-25 ENCOUNTER — Emergency Department (HOSPITAL_COMMUNITY): Payer: BLUE CROSS/BLUE SHIELD

## 2019-02-25 DIAGNOSIS — E119 Type 2 diabetes mellitus without complications: Secondary | ICD-10-CM | POA: Diagnosis not present

## 2019-02-25 DIAGNOSIS — R1031 Right lower quadrant pain: Secondary | ICD-10-CM | POA: Diagnosis present

## 2019-02-25 DIAGNOSIS — R51 Headache: Secondary | ICD-10-CM | POA: Diagnosis not present

## 2019-02-25 DIAGNOSIS — Z7984 Long term (current) use of oral hypoglycemic drugs: Secondary | ICD-10-CM | POA: Diagnosis not present

## 2019-02-25 DIAGNOSIS — Z7982 Long term (current) use of aspirin: Secondary | ICD-10-CM | POA: Insufficient documentation

## 2019-02-25 DIAGNOSIS — R509 Fever, unspecified: Secondary | ICD-10-CM | POA: Diagnosis not present

## 2019-02-25 LAB — I-STAT BETA HCG BLOOD, ED (MC, WL, AP ONLY): I-stat hCG, quantitative: <5 m[IU]/mL (ref <5)

## 2019-02-25 LAB — CBC WITH DIFFERENTIAL/PLATELET
Abs Immature Granulocytes: 0.02 10*3/uL (ref 0.00–0.07)
Basophils Absolute: 0 10*3/uL (ref 0.0–0.1)
Basophils Relative: 1 %
Eosinophils Absolute: 0.2 10*3/uL (ref 0.0–0.5)
Eosinophils Relative: 4 %
HCT: 32.5 % — ABNORMAL LOW (ref 36.0–46.0)
Hemoglobin: 10.8 g/dL — ABNORMAL LOW (ref 12.0–15.0)
Immature Granulocytes: 0 %
Lymphocytes Relative: 15 %
Lymphs Abs: 1 10*3/uL (ref 0.7–4.0)
MCH: 25.8 pg — ABNORMAL LOW (ref 26.0–34.0)
MCHC: 33.2 g/dL (ref 30.0–36.0)
MCV: 77.6 fL — ABNORMAL LOW (ref 80.0–100.0)
Monocytes Absolute: 0.6 10*3/uL (ref 0.1–1.0)
Monocytes Relative: 9 %
Neutro Abs: 4.8 10*3/uL (ref 1.7–7.7)
Neutrophils Relative %: 71 %
Platelets: 200 10*3/uL (ref 150–400)
RBC: 4.19 MIL/uL (ref 3.87–5.11)
RDW: 15.9 % — ABNORMAL HIGH (ref 11.5–15.5)
WBC: 6.7 10*3/uL (ref 4.0–10.5)
nRBC: 0 % (ref 0.0–0.2)

## 2019-02-25 LAB — COMPREHENSIVE METABOLIC PANEL
ALT: 14 U/L (ref 0–44)
AST: 15 U/L (ref 15–41)
Albumin: 4.1 g/dL (ref 3.5–5.0)
Alkaline Phosphatase: 78 U/L (ref 38–126)
Anion gap: 9 (ref 5–15)
BUN: 9 mg/dL (ref 6–20)
CO2: 22 mmol/L (ref 22–32)
Calcium: 9.4 mg/dL (ref 8.9–10.3)
Chloride: 103 mmol/L (ref 98–111)
Creatinine, Ser: 0.51 mg/dL (ref 0.44–1.00)
GFR calc Af Amer: >60 mL/min (ref >60)
GFR calc non Af Amer: >60 mL/min (ref >60)
Glucose, Bld: 324 mg/dL — ABNORMAL HIGH (ref 70–99)
Potassium: 4.5 mmol/L (ref 3.5–5.1)
Sodium: 134 mmol/L — ABNORMAL LOW (ref 135–145)
Total Bilirubin: 1.1 mg/dL (ref 0.3–1.2)
Total Protein: 6.6 g/dL (ref 6.5–8.1)

## 2019-02-25 LAB — LIPASE, BLOOD: Lipase: 33 U/L (ref 11–51)

## 2019-02-25 MED ORDER — BUTALBITAL-APAP-CAFFEINE 50-325-40 MG PO TABS
2.0000 | ORAL_TABLET | Freq: Once | ORAL | Status: AC
  Administered 2019-02-25: 2 via ORAL
  Filled 2019-02-25: qty 2

## 2019-02-25 MED ORDER — SODIUM CHLORIDE 0.9 % IV BOLUS
1000.0000 mL | Freq: Once | INTRAVENOUS | Status: AC
  Administered 2019-02-25: 1000 mL via INTRAVENOUS

## 2019-02-25 MED ORDER — MORPHINE SULFATE (PF) 4 MG/ML IV SOLN
4.0000 mg | Freq: Once | INTRAVENOUS | Status: AC
  Administered 2019-02-25: 4 mg via INTRAVENOUS
  Filled 2019-02-25: qty 1

## 2019-02-25 MED ORDER — EPINEPHRINE 0.3 MG/0.3ML IJ SOAJ
INTRAMUSCULAR | Status: AC
  Administered 2019-02-25: 0.3 mg
  Filled 2019-02-25: qty 0.3

## 2019-02-25 MED ORDER — DIPHENHYDRAMINE HCL 50 MG/ML IJ SOLN
25.0000 mg | Freq: Once | INTRAMUSCULAR | Status: AC
  Administered 2019-02-25: 25 mg via INTRAVENOUS
  Filled 2019-02-25: qty 1

## 2019-02-25 MED ORDER — IOHEXOL 300 MG/ML  SOLN
100.0000 mL | Freq: Once | INTRAMUSCULAR | Status: AC | PRN
  Administered 2019-02-25: 100 mL via INTRAVENOUS

## 2019-02-25 MED ORDER — EPINEPHRINE 0.3 MG/0.3ML IJ SOAJ: 0.3000 mg | Freq: Once | INTRAMUSCULAR | Status: DC
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
## 2019-02-25 NOTE — ED Notes (Signed)
Pt had anaphylatic reaction to morphine, rash and difficulty breathing. Pt received 25 benedryl and Epi-pen. Responded well to treatment, VSS WDL at this time, breathing unlabored, sleepy but responsive.

## 2019-02-25 NOTE — ED Notes (Signed)
Pt resting comfortably at this time. C/o chest pain but no difficulty breathing, reported to resident. No new orders at this time, vitals remain WDL

## 2019-02-25 NOTE — ED Notes (Signed)
E-signature pad not available, pt voices no questions at discharge
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
## 2019-02-25 NOTE — ED Notes (Signed)
Went to give morphine, pt in CT at this time

## 2019-02-25 NOTE — ED Triage Notes (Signed)
Arrived POV for H/A and Abdominal pain.

## 2019-03-24 ENCOUNTER — Emergency Department (HOSPITAL_COMMUNITY): Payer: BC Managed Care – PPO

## 2019-03-24 ENCOUNTER — Emergency Department (HOSPITAL_COMMUNITY)
Admission: EM | Admit: 2019-03-24 | Discharge: 2019-03-24 | Disposition: A | Discharge status: Home / Self Care | Payer: BC Managed Care – PPO | Attending: Emergency Medicine | Admitting: Emergency Medicine

## 2019-03-24 ENCOUNTER — Other Ambulatory Visit: Payer: Self-pay

## 2019-03-24 ENCOUNTER — Encounter (HOSPITAL_COMMUNITY): Payer: Self-pay | Admitting: Emergency Medicine

## 2019-03-24 DIAGNOSIS — R0789 Other chest pain: Secondary | ICD-10-CM | POA: Diagnosis not present

## 2019-03-24 DIAGNOSIS — Z79899 Other long term (current) drug therapy: Secondary | ICD-10-CM | POA: Diagnosis not present

## 2019-03-24 DIAGNOSIS — E119 Type 2 diabetes mellitus without complications: Secondary | ICD-10-CM | POA: Diagnosis not present

## 2019-03-24 DIAGNOSIS — Z8673 Personal history of transient ischemic attack (TIA), and cerebral infarction without residual deficits: Secondary | ICD-10-CM | POA: Insufficient documentation

## 2019-03-24 DIAGNOSIS — Z7982 Long term (current) use of aspirin: Secondary | ICD-10-CM | POA: Insufficient documentation

## 2019-03-24 DIAGNOSIS — Z7984 Long term (current) use of oral hypoglycemic drugs: Secondary | ICD-10-CM | POA: Insufficient documentation

## 2019-03-24 DIAGNOSIS — R079 Chest pain, unspecified: Secondary | ICD-10-CM | POA: Diagnosis present

## 2019-03-24 LAB — CBC
HCT: 31.8 % — ABNORMAL LOW (ref 36.0–46.0)
Hemoglobin: 10.3 g/dL — ABNORMAL LOW (ref 12.0–15.0)
MCH: 26.4 pg (ref 26.0–34.0)
MCHC: 32.4 g/dL (ref 30.0–36.0)
MCV: 81.5 fL (ref 80.0–100.0)
Platelets: 196 10*3/uL (ref 150–400)
RBC: 3.9 MIL/uL (ref 3.87–5.11)
RDW: 15.9 % — ABNORMAL HIGH (ref 11.5–15.5)
WBC: 7 10*3/uL (ref 4.0–10.5)
nRBC: 0 % (ref 0.0–0.2)

## 2019-03-24 LAB — HEPATIC FUNCTION PANEL
ALT: 13 U/L (ref 0–44)
AST: 15 U/L (ref 15–41)
Albumin: 3.9 g/dL (ref 3.5–5.0)
Alkaline Phosphatase: 67 U/L (ref 38–126)
Bilirubin, Direct: 0.1 mg/dL (ref 0.0–0.2)
Indirect Bilirubin: 0.7 mg/dL (ref 0.3–0.9)
Total Bilirubin: 0.8 mg/dL (ref 0.3–1.2)
Total Protein: 6.9 g/dL (ref 6.5–8.1)

## 2019-03-24 LAB — BASIC METABOLIC PANEL
Anion gap: 9 (ref 5–15)
BUN: 12 mg/dL (ref 6–20)
CO2: 22 mmol/L (ref 22–32)
Calcium: 9.5 mg/dL (ref 8.9–10.3)
Chloride: 105 mmol/L (ref 98–111)
Creatinine, Ser: 0.57 mg/dL (ref 0.44–1.00)
GFR calc Af Amer: >60 mL/min (ref >60)
GFR calc non Af Amer: >60 mL/min (ref >60)
Glucose, Bld: 358 mg/dL — ABNORMAL HIGH (ref 70–99)
Potassium: 4.5 mmol/L (ref 3.5–5.1)
Sodium: 136 mmol/L (ref 135–145)

## 2019-03-24 LAB — I-STAT BETA HCG BLOOD, ED (MC, WL, AP ONLY): I-stat hCG, quantitative: <5 m[IU]/mL (ref <5)

## 2019-03-24 LAB — TROPONIN I
Troponin I: <0.03 ng/mL (ref <0.03)
Troponin I: <0.03 ng/mL (ref <0.03)

## 2019-03-24 LAB — SEDIMENTATION RATE: Sed Rate: 22 mm/hr (ref 0–22)

## 2019-03-24 LAB — LIPASE, BLOOD: Lipase: 31 U/L (ref 11–51)

## 2019-03-24 LAB — C-REACTIVE PROTEIN: CRP: <0.8 mg/dL (ref <1.0)

## 2019-03-24 MED ORDER — ACETAMINOPHEN 500 MG PO TABS
1000.0000 mg | ORAL_TABLET | Freq: Once | ORAL | Status: AC
  Administered 2019-03-24: 1000 mg via ORAL
  Filled 2019-03-24: qty 2

## 2019-03-24 MED ORDER — NAPROXEN 250 MG PO TABS
500.0000 mg | ORAL_TABLET | Freq: Once | ORAL | Status: AC
  Administered 2019-03-24: 500 mg via ORAL
  Filled 2019-03-24: qty 2

## 2019-03-24 MED ORDER — SODIUM CHLORIDE 0.9% FLUSH: 3.0000 mL | Freq: Once | INTRAVENOUS | Status: DC

## 2019-03-24 MED ORDER — LIDOCAINE 5 % EX PTCH
1.0000 | MEDICATED_PATCH | CUTANEOUS | Status: DC
  Administered 2019-03-24: 1 via TRANSDERMAL
  Filled 2019-03-24: qty 1

## 2019-03-24 NOTE — ED Triage Notes (Signed)
Pt c/o mid cp onset 4 am with shob and burning feeling in chest. Denies cough, pt reports fever 102. Ibuprofen hourly since 0330.

## 2019-03-24 NOTE — Discharge Instructions (Signed)
You were evaluated in the emergency department for chest pain.  Work up today was normal. I suspect your pain is from muscular pain or inflammation of the sac around your heart or lung lining.   Alternate ibuprofen and acetaminophen every 6-8 hours for pain.    Based on your risk factors, work up and exam you are considered low risk for major adverse cardiac events in the next 30 days.  This means you can be discharged with close follow up with your primary care doctor for further outpatient work up if symptoms persist.   Call your primary care doctor as soon as possible to establish care and further discussion and work up of your symptoms on an outpatient setting  Please return to ED if: Your chest pain is worse or on exertion You have a cough that gets worse, or you cough up blood. You have severe pain in chest, back or abdomen. You have chest pain or shortness of breath with exertion or activity You have sudden, unexplained discomfort in your chest, with radiation arms, back, neck, or jaw. You suddenly have chest pain and begin to sweat, or your skin gets clammy. You feel chest pain with nausea or vomiting. You suddenly feel light-headed or faint. Your heart begins to beat quickly, or it feels like it is skipping beats. You have one sided leg swelling or calf pain
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
## 2019-03-24 NOTE — ED Notes (Signed)
Patient verbalizes understanding of discharge instructions. Opportunity for questioning and answers were provided. Armband removed by staff, pt discharged from ED. Ambulated out to lobby  

## 2019-03-24 NOTE — ED Notes (Signed)
Pt reports a stabbing, aching, burning in her chest, generalized all over, that started at 0300 this date. Pt reports taking ibuprofen for pain and same helping.

## 2019-04-16 ENCOUNTER — Other Ambulatory Visit: Payer: Self-pay

## 2019-04-16 ENCOUNTER — Emergency Department (HOSPITAL_COMMUNITY)
Admission: EM | Admit: 2019-04-16 | Discharge: 2019-04-16 | Disposition: A | Discharge status: Home / Self Care | Payer: BC Managed Care – PPO | Attending: Emergency Medicine | Admitting: Emergency Medicine

## 2019-04-16 ENCOUNTER — Emergency Department (HOSPITAL_COMMUNITY): Payer: BC Managed Care – PPO

## 2019-04-16 DIAGNOSIS — R0602 Shortness of breath: Secondary | ICD-10-CM | POA: Insufficient documentation

## 2019-04-16 DIAGNOSIS — R51 Headache: Secondary | ICD-10-CM | POA: Diagnosis not present

## 2019-04-16 DIAGNOSIS — Z79899 Other long term (current) drug therapy: Secondary | ICD-10-CM | POA: Diagnosis not present

## 2019-04-16 DIAGNOSIS — R072 Precordial pain: Secondary | ICD-10-CM | POA: Insufficient documentation

## 2019-04-16 DIAGNOSIS — Z7984 Long term (current) use of oral hypoglycemic drugs: Secondary | ICD-10-CM | POA: Insufficient documentation

## 2019-04-16 DIAGNOSIS — Z7982 Long term (current) use of aspirin: Secondary | ICD-10-CM | POA: Diagnosis not present

## 2019-04-16 DIAGNOSIS — E119 Type 2 diabetes mellitus without complications: Secondary | ICD-10-CM | POA: Insufficient documentation

## 2019-04-16 DIAGNOSIS — Z20828 Contact with and (suspected) exposure to other viral communicable diseases: Secondary | ICD-10-CM | POA: Insufficient documentation

## 2019-04-16 LAB — CBC WITH DIFFERENTIAL/PLATELET
Abs Immature Granulocytes: 0.04 10*3/uL (ref 0.00–0.07)
Basophils Absolute: 0 10*3/uL (ref 0.0–0.1)
Basophils Relative: 0 %
Eosinophils Absolute: 0.2 10*3/uL (ref 0.0–0.5)
Eosinophils Relative: 2 %
HCT: 38.4 % (ref 36.0–46.0)
Hemoglobin: 12.4 g/dL (ref 12.0–15.0)
Immature Granulocytes: 0 %
Lymphocytes Relative: 12 %
Lymphs Abs: 1.1 10*3/uL (ref 0.7–4.0)
MCH: 25.7 pg — ABNORMAL LOW (ref 26.0–34.0)
MCHC: 32.3 g/dL (ref 30.0–36.0)
MCV: 79.5 fL — ABNORMAL LOW (ref 80.0–100.0)
Monocytes Absolute: 0.6 10*3/uL (ref 0.1–1.0)
Monocytes Relative: 6 %
Neutro Abs: 7.2 10*3/uL (ref 1.7–7.7)
Neutrophils Relative %: 80 %
Platelets: 233 10*3/uL (ref 150–400)
RBC: 4.83 MIL/uL (ref 3.87–5.11)
RDW: 15.2 % (ref 11.5–15.5)
WBC: 9.1 10*3/uL (ref 4.0–10.5)
nRBC: 0 % (ref 0.0–0.2)

## 2019-04-16 LAB — URINALYSIS, ROUTINE W REFLEX MICROSCOPIC
Bilirubin Urine: NEGATIVE
Glucose, UA: >=500 mg/dL — AB
Ketones, ur: 5 mg/dL — AB
Nitrite: NEGATIVE
Protein, ur: 100 mg/dL — AB
RBC / HPF: >50 RBC/hpf — ABNORMAL HIGH (ref 0–5)
Specific Gravity, Urine: 1.026 (ref 1.005–1.030)
WBC, UA: >50 WBC/hpf — ABNORMAL HIGH (ref 0–5)
pH: 6 (ref 5.0–8.0)

## 2019-04-16 LAB — COMPREHENSIVE METABOLIC PANEL
ALT: 13 U/L (ref 0–44)
AST: 16 U/L (ref 15–41)
Albumin: 4.2 g/dL (ref 3.5–5.0)
Alkaline Phosphatase: 80 U/L (ref 38–126)
Anion gap: 14 (ref 5–15)
BUN: 12 mg/dL (ref 6–20)
CO2: 23 mmol/L (ref 22–32)
Calcium: 9.6 mg/dL (ref 8.9–10.3)
Chloride: 99 mmol/L (ref 98–111)
Creatinine, Ser: 0.65 mg/dL (ref 0.44–1.00)
GFR calc Af Amer: >60 mL/min (ref >60)
GFR calc non Af Amer: >60 mL/min (ref >60)
Glucose, Bld: 397 mg/dL — ABNORMAL HIGH (ref 70–99)
Potassium: 4.3 mmol/L (ref 3.5–5.1)
Sodium: 136 mmol/L (ref 135–145)
Total Bilirubin: 1.1 mg/dL (ref 0.3–1.2)
Total Protein: 7.4 g/dL (ref 6.5–8.1)

## 2019-04-16 LAB — SARS CORONAVIRUS 2 BY RT PCR (HOSPITAL ORDER, PERFORMED IN ~~LOC~~ HOSPITAL LAB): SARS Coronavirus 2: NEGATIVE

## 2019-04-16 LAB — TROPONIN I (HIGH SENSITIVITY)
Troponin I (High Sensitivity): <2 ng/L (ref <18)
Troponin I (High Sensitivity): 2 ng/L (ref <18)

## 2019-04-16 LAB — LIPASE, BLOOD: Lipase: 27 U/L (ref 11–51)

## 2019-04-16 LAB — LACTIC ACID, PLASMA
Lactic Acid, Venous: 1.2 mmol/L (ref 0.5–1.9)
Lactic Acid, Venous: 1.2 mmol/L (ref 0.5–1.9)

## 2019-04-16 LAB — POC URINE PREG, ED: Preg Test, Ur: NEGATIVE

## 2019-04-16 LAB — D-DIMER, QUANTITATIVE: D-Dimer, Quant: 0.35 ug/mL-FEU (ref 0.00–0.50)

## 2019-04-16 MED ORDER — SODIUM CHLORIDE 0.9 % IV BOLUS
1000.0000 mL | Freq: Once | INTRAVENOUS | Status: AC
  Administered 2019-04-16: 08:00:00 1000 mL via INTRAVENOUS
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
## 2019-04-16 NOTE — Discharge Instructions (Signed)
Your work-up today was overall reassuring.  The x-ray did not show pneumonia.  Your EKG did not show evidence of cardiac injury and your troponin was negative twice.  Your d-dimer was negative for blood clot.  Your coronavirus test was negative.  I suspect a viral infection causing your symptoms.  Please follow-up with your PCP and likely cardiologist given your comorbidities for heart disease.  Please rest and stay hydrated.  Please treat your fevers.

## 2019-04-16 NOTE — ED Notes (Addendum)
Pt reports she took ibuprofen about 3 hours prior to the time of temperature taken here.

## 2019-04-16 NOTE — ED Notes (Signed)
Pt given discharge instructions, follow up information and the opportunity to ask questions. Pt verbalized understanding. Pt discharged from the ED without incident.  

## 2019-04-16 NOTE — ED Triage Notes (Signed)
Per pt she as been having sob with headache and fevers since Monday. Pt says she feels very weak with no appetite. Vomiting also.

## 2019-04-25 ENCOUNTER — Other Ambulatory Visit: Payer: Self-pay

## 2019-04-25 ENCOUNTER — Encounter (HOSPITAL_COMMUNITY): Payer: Self-pay | Admitting: Emergency Medicine

## 2019-04-25 ENCOUNTER — Emergency Department (HOSPITAL_COMMUNITY)
Admission: EM | Admit: 2019-04-25 | Discharge: 2019-04-25 | Disposition: A | Discharge status: Home / Self Care | Payer: BC Managed Care – PPO | Attending: Emergency Medicine | Admitting: Emergency Medicine

## 2019-04-25 ENCOUNTER — Emergency Department (HOSPITAL_COMMUNITY): Payer: BC Managed Care – PPO

## 2019-04-25 DIAGNOSIS — R059 Cough, unspecified: Secondary | ICD-10-CM

## 2019-04-25 DIAGNOSIS — Z7984 Long term (current) use of oral hypoglycemic drugs: Secondary | ICD-10-CM | POA: Diagnosis not present

## 2019-04-25 DIAGNOSIS — Z8673 Personal history of transient ischemic attack (TIA), and cerebral infarction without residual deficits: Secondary | ICD-10-CM | POA: Diagnosis not present

## 2019-04-25 DIAGNOSIS — H53149 Visual discomfort, unspecified: Secondary | ICD-10-CM | POA: Insufficient documentation

## 2019-04-25 DIAGNOSIS — R519 Headache, unspecified: Secondary | ICD-10-CM

## 2019-04-25 DIAGNOSIS — R51 Headache: Secondary | ICD-10-CM | POA: Diagnosis present

## 2019-04-25 DIAGNOSIS — R05 Cough: Secondary | ICD-10-CM | POA: Diagnosis not present

## 2019-04-25 DIAGNOSIS — E119 Type 2 diabetes mellitus without complications: Secondary | ICD-10-CM | POA: Diagnosis not present

## 2019-04-25 LAB — COMPREHENSIVE METABOLIC PANEL
ALT: 13 U/L (ref 0–44)
AST: 13 U/L — ABNORMAL LOW (ref 15–41)
Albumin: 3.6 g/dL (ref 3.5–5.0)
Alkaline Phosphatase: 82 U/L (ref 38–126)
Anion gap: 10 (ref 5–15)
BUN: 7 mg/dL (ref 6–20)
CO2: 22 mmol/L (ref 22–32)
Calcium: 8.7 mg/dL — ABNORMAL LOW (ref 8.9–10.3)
Chloride: 104 mmol/L (ref 98–111)
Creatinine, Ser: 0.49 mg/dL (ref 0.44–1.00)
GFR calc Af Amer: >60 mL/min (ref >60)
GFR calc non Af Amer: >60 mL/min (ref >60)
Glucose, Bld: 364 mg/dL — ABNORMAL HIGH (ref 70–99)
Potassium: 4.1 mmol/L (ref 3.5–5.1)
Sodium: 136 mmol/L (ref 135–145)
Total Bilirubin: 0.9 mg/dL (ref 0.3–1.2)
Total Protein: 6.2 g/dL — ABNORMAL LOW (ref 6.5–8.1)

## 2019-04-25 LAB — URINALYSIS, ROUTINE W REFLEX MICROSCOPIC
Bacteria, UA: NONE SEEN
Bilirubin Urine: NEGATIVE
Glucose, UA: >=500 mg/dL — AB
Hgb urine dipstick: NEGATIVE
Ketones, ur: NEGATIVE mg/dL
Nitrite: NEGATIVE
Protein, ur: NEGATIVE mg/dL
Specific Gravity, Urine: 1.026 (ref 1.005–1.030)
pH: 6 (ref 5.0–8.0)

## 2019-04-25 LAB — CBC WITH DIFFERENTIAL/PLATELET
Abs Immature Granulocytes: 0.03 10*3/uL (ref 0.00–0.07)
Basophils Absolute: 0 10*3/uL (ref 0.0–0.1)
Basophils Relative: 1 %
Eosinophils Absolute: 0.2 10*3/uL (ref 0.0–0.5)
Eosinophils Relative: 4 %
HCT: 31 % — ABNORMAL LOW (ref 36.0–46.0)
Hemoglobin: 10.1 g/dL — ABNORMAL LOW (ref 12.0–15.0)
Immature Granulocytes: 1 %
Lymphocytes Relative: 17 %
Lymphs Abs: 1.1 10*3/uL (ref 0.7–4.0)
MCH: 26.4 pg (ref 26.0–34.0)
MCHC: 32.6 g/dL (ref 30.0–36.0)
MCV: 81.2 fL (ref 80.0–100.0)
Monocytes Absolute: 0.4 10*3/uL (ref 0.1–1.0)
Monocytes Relative: 7 %
Neutro Abs: 4.4 10*3/uL (ref 1.7–7.7)
Neutrophils Relative %: 70 %
Platelets: 198 10*3/uL (ref 150–400)
RBC: 3.82 MIL/uL — ABNORMAL LOW (ref 3.87–5.11)
RDW: 15.3 % (ref 11.5–15.5)
WBC: 6.2 10*3/uL (ref 4.0–10.5)
nRBC: 0 % (ref 0.0–0.2)

## 2019-04-25 LAB — TROPONIN I (HIGH SENSITIVITY)
Troponin I (High Sensitivity): <2 ng/L (ref <18)
Troponin I (High Sensitivity): <2 ng/L (ref <18)

## 2019-04-25 MED ORDER — SODIUM CHLORIDE 0.9 % IV BOLUS
1000.0000 mL | Freq: Once | INTRAVENOUS | Status: AC
  Administered 2019-04-25: 1000 mL via INTRAVENOUS

## 2019-04-25 MED ORDER — DIPHENHYDRAMINE HCL 50 MG/ML IJ SOLN
25.0000 mg | Freq: Once | INTRAMUSCULAR | Status: AC
  Administered 2019-04-25: 25 mg via INTRAVENOUS
  Filled 2019-04-25: qty 1

## 2019-04-25 MED ORDER — PROCHLORPERAZINE MALEATE 10 MG PO TABS: 10.0000 mg | ORAL_TABLET | Freq: Two times a day (BID) | ORAL | 0 refills | Status: DC | PRN

## 2019-04-25 MED ORDER — PROCHLORPERAZINE EDISYLATE 10 MG/2ML IJ SOLN
10.0000 mg | Freq: Once | INTRAMUSCULAR | Status: AC
  Administered 2019-04-25: 10 mg via INTRAVENOUS
  Filled 2019-04-25: qty 2

## 2019-04-25 MED ORDER — KETOROLAC TROMETHAMINE 30 MG/ML IJ SOLN
30.0000 mg | Freq: Once | INTRAMUSCULAR | Status: AC
  Administered 2019-04-25: 30 mg via INTRAVENOUS
  Filled 2019-04-25: qty 1
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
## 2019-04-25 NOTE — ED Triage Notes (Signed)
Patient with shortness of breath, headache, eye and left facial twitching.  Patient states that it has been going on since she woke up at 0100.  Patient denies any nausea or vomiting at this time.

## 2019-04-25 NOTE — Discharge Instructions (Signed)
Your work-up today was overall reassuring.  As your headache resolved after the headache medications, we feel you are safe for discharge home.  Your other work-up is reassuring.  We had our shared decision-making conversation and agreed to hold on further imaging unless her symptoms worsened which he did not.  Please follow-up with your primary doctor.  If any symptoms change or worsen, please return to the nearest emergency department.  Please use the headache medicine to help with your symptoms.

## 2019-04-30 ENCOUNTER — Other Ambulatory Visit: Payer: Self-pay | Admitting: Family Medicine

## 2019-04-30 ENCOUNTER — Ambulatory Visit
Admission: RE | Admit: 2019-04-30 | Discharge: 2019-04-30 | Disposition: A | Discharge status: Home / Self Care | Payer: BC Managed Care – PPO | Source: Ambulatory Visit | Attending: Family Medicine | Admitting: Family Medicine

## 2019-04-30 DIAGNOSIS — M25531 Pain in right wrist: Secondary | ICD-10-CM

## 2019-04-30 DIAGNOSIS — M25532 Pain in left wrist: Secondary | ICD-10-CM

## 2019-05-08 ENCOUNTER — Encounter (HOSPITAL_COMMUNITY): Payer: Self-pay | Admitting: *Deleted

## 2019-05-08 ENCOUNTER — Other Ambulatory Visit: Payer: Self-pay

## 2019-05-08 ENCOUNTER — Emergency Department (HOSPITAL_COMMUNITY): Payer: BC Managed Care – PPO

## 2019-05-08 ENCOUNTER — Emergency Department (HOSPITAL_COMMUNITY)
Admission: EM | Admit: 2019-05-08 | Discharge: 2019-05-08 | Disposition: A | Discharge status: Home / Self Care | Payer: BC Managed Care – PPO | Attending: Emergency Medicine | Admitting: Emergency Medicine

## 2019-05-08 DIAGNOSIS — Z79899 Other long term (current) drug therapy: Secondary | ICD-10-CM | POA: Insufficient documentation

## 2019-05-08 DIAGNOSIS — Z7982 Long term (current) use of aspirin: Secondary | ICD-10-CM | POA: Diagnosis not present

## 2019-05-08 DIAGNOSIS — E119 Type 2 diabetes mellitus without complications: Secondary | ICD-10-CM | POA: Insufficient documentation

## 2019-05-08 DIAGNOSIS — Z7984 Long term (current) use of oral hypoglycemic drugs: Secondary | ICD-10-CM | POA: Insufficient documentation

## 2019-05-08 DIAGNOSIS — R0602 Shortness of breath: Secondary | ICD-10-CM | POA: Insufficient documentation

## 2019-05-08 DIAGNOSIS — R0789 Other chest pain: Secondary | ICD-10-CM

## 2019-05-08 LAB — BASIC METABOLIC PANEL
Anion gap: 10 (ref 5–15)
BUN: 9 mg/dL (ref 6–20)
CO2: 22 mmol/L (ref 22–32)
Calcium: 9 mg/dL (ref 8.9–10.3)
Chloride: 103 mmol/L (ref 98–111)
Creatinine, Ser: 0.44 mg/dL (ref 0.44–1.00)
GFR calc Af Amer: >60 mL/min (ref >60)
GFR calc non Af Amer: >60 mL/min (ref >60)
Glucose, Bld: 347 mg/dL — ABNORMAL HIGH (ref 70–99)
Potassium: 4 mmol/L (ref 3.5–5.1)
Sodium: 135 mmol/L (ref 135–145)

## 2019-05-08 LAB — CBC
HCT: 32.8 % — ABNORMAL LOW (ref 36.0–46.0)
Hemoglobin: 10.8 g/dL — ABNORMAL LOW (ref 12.0–15.0)
MCH: 26.2 pg (ref 26.0–34.0)
MCHC: 32.9 g/dL (ref 30.0–36.0)
MCV: 79.4 fL — ABNORMAL LOW (ref 80.0–100.0)
Platelets: 208 10*3/uL (ref 150–400)
RBC: 4.13 MIL/uL (ref 3.87–5.11)
RDW: 15.5 % (ref 11.5–15.5)
WBC: 6.2 10*3/uL (ref 4.0–10.5)
nRBC: 0 % (ref 0.0–0.2)

## 2019-05-08 LAB — TROPONIN I (HIGH SENSITIVITY)
Troponin I (High Sensitivity): 3 ng/L (ref <18)
Troponin I (High Sensitivity): 4 ng/L (ref <18)

## 2019-05-08 LAB — D-DIMER, QUANTITATIVE: D-Dimer, Quant: <0.27 ug/mL-FEU (ref 0.00–0.50)

## 2019-05-08 LAB — I-STAT BETA HCG BLOOD, ED (MC, WL, AP ONLY): I-stat hCG, quantitative: <5 m[IU]/mL (ref <5)

## 2019-05-08 MED ORDER — SODIUM CHLORIDE 0.9% FLUSH: 3.0000 mL | Freq: Once | INTRAVENOUS | Status: DC
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
## 2019-05-08 NOTE — Discharge Instructions (Addendum)
You have been seen today for chest pain. Please read and follow all provided instructions.  ° °1. Medications: usual home medications °2. Treatment: rest, drink plenty of fluids °3. Follow Up: Please follow up with your primary doctor in 2 days for discussion of your diagnoses and further evaluation after today's visit; if you do not have a primary care doctor use the resource guide provided to find one; Please return to the ER for any new or worsening symptoms. Please obtain all of your results from medical records or have your doctors office obtain the results - share them with your doctor - you should be seen at your doctors office. Call today to arrange your follow up.  ° °Take medications as prescribed. Please review all of the medicines and only take them if you do not have an allergy to them. Return to the emergency room for worsening condition or new concerning symptoms. Follow up with your regular doctor. If you don't have a regular doctor use one of the numbers below to establish a primary care doctor. ° °Please be aware that if you are taking birth control pills, taking other prescriptions, ESPECIALLY ANTIBIOTICS may make the birth control ineffective - if this is the case, either do not engage in sexual activity or use alternative methods of birth control such as condoms until you have finished the medicine and your family doctor says it is OK to restart them. If you are on a blood thinner such as COUMADIN, be aware that any other medicine that you take may cause the coumadin to either work too much, or not enough - you should have your coumadin level rechecked in next 7 days if this is the case.  °?  °It is also a possibility that you have an allergic reaction to any of the medicines that you have been prescribed - Everybody reacts differently to medications and while MOST people have no trouble with most medicines, you may have a reaction such as nausea, vomiting, rash, swelling, shortness of breath.  If this is the case, please stop taking the medicine immediately and contact your physician.  °?  °You should return to the ER if you develop severe or worsening symptoms.  ° °Emergency Department Resource Guide °1) Find a Doctor and Pay Out of Pocket °Although you won't have to find out who is covered by your insurance plan, it is a good idea to ask around and get recommendations. You will then need to call the office and see if the doctor you have chosen will accept you as a new patient and what types of options they offer for patients who are self-pay. Some doctors offer discounts or will set up payment plans for their patients who do not have insurance, but you will need to ask so you aren't surprised when you get to your appointment. ° °2) Contact Your Local Health Department °Not all health departments have doctors that can see patients for sick visits, but many do, so it is worth a call to see if yours does. If you don't know where your local health department is, you can check in your phone book. The CDC also has a tool to help you locate your state's health department, and many state websites also have listings of all of their local health departments. ° °3) Find a Walk-in Clinic °If your illness is not likely to be very severe or complicated, you may want to try a walk in clinic. These are popping up all over   the country in pharmacies, drugstores, and shopping centers. They're usually staffed by nurse practitioners or physician assistants that have been trained to treat common illnesses and complaints. They're usually fairly quick and inexpensive. However, if you have serious medical issues or chronic medical problems, these are probably not your best option. ° °No Primary Care Doctor: °Call [facility] at  832-8000 - they can help you locate a primary care doctor that  accepts your insurance, provides certain services, etc. °Physician Referral Service- 1-800-533-3463 ° °Chronic Pain  Problems: °Organization         Address  Phone   Notes  ° [facility]  ([phone]) [phone] Patients need to be referred by their primary care doctor.  ° °Medication Assistance: °Organization         Address  Phone   Notes  °[place] [facility] [address] °[place], [place] [address] ([phone]) [phone] --Must be a resident of [place] °-- Must have NO insurance coverage whatsoever (no Medicaid/ Medicare, etc.) °-- The pt. MUST have a primary care doctor that directs their care regularly and follows them in the community °  °[facility]  (866) 331-1348   °[employer]  (888) 892-1162   ° °Agencies that provide inexpensive medical care: °Organization         Address  Phone   Notes  °[facility]  ([phone]) [phone]   °[facility]    (336) [phone]   °[facility] [address] °[place], [place] [address] ([phone]) [phone]   °[facility] [address], °[place] (336) [phone]   °[facility]    (336) 373-0678   °[facility]    (336) 272-1050   °[facility] ° [address], [place] Phone:  (336) [phone], Fax:  ([phone]) [phone] Hours of Operation:  9 am - 6 pm, M-F.  Also accepts Medicaid/Medicare and self-pay.  °[facility] ° [address], [place] Phone: ([phone]) [phone], Fax: ([phone]) [phone]. Hours of Operation:  8:30 am - 5:30 pm, M-F.  Also accepts Medicaid and self-pay.  °[facility] [place] [address], [place] Phone: (336) [phone]   °[facility] [address], [place], [place] (336)[phone], Ext. 123 Mondays & Thursdays: 7-9 AM.  First 15 patients are seen on a first come, first serve basis. °  ° °Medicaid-accepting [place] Providers: ° °Organization         Address  Phone   Notes  °[facility] [address], [place] ([phone]) [phone] Also  accepts self-pay patients.  °[facility] [address], [place] ° ([phone]) [phone]   °[facility] [address], [place] ([phone]) [phone]   °[facility] [address] [place] [address], [place] ([phone]) [phone]   °[facility] [address], [place]  ° ([phone]) [phone] Only accepts [place] Access Medicaid patients after they have their name applied to their card.  ° °Self-Pay (no insurance) in [place]: ° °Organization         Address  Phone   Notes  °Sickle Cell Patients, [facility] [address], [place] ([phone]) [phone]   °[facility] [address], [place] ([phone]) [phone]   °[facility] ° [address], [place] ([phone]) [phone]   °[facility]/Dr. Osei-Bonsu ° [address] [place] [address], [place] or [address], [place] [phone]) [phone] Phone number for both [place] and [place] locations is the same.  °[facility] [address], [place] ([phone]) [phone]   °[facility] [place] [address] [place] [address], [place] or [address] ([phone]) [phone] °([phone]) [phone]   °[facility] [address], [place] ([phone]) [phone], phone; ([phone]) [phone], fax Sees patients 1st and 3rd Saturday of every month.  Must not qualify for public or private insurance (i.e. Medicaid, Medicare, [place] Health Choice, Veterans' Benefits)  Household income should be no more than 200% of the poverty level The clinic cannot treat you if you are pregnant or think you are pregnant  Sexually transmitted diseases are not treated at the clinic.  °  ° °

## 2019-05-08 NOTE — ED Triage Notes (Signed)
Pt reports chest pain that started this morning at 3am, woke her from sleep. Denies SOB, endorses weakness; pain radiated to bilateral arms and into back. Hx of stroke

## 2019-05-12 ENCOUNTER — Encounter (HOSPITAL_COMMUNITY): Payer: Self-pay | Admitting: *Deleted

## 2019-05-12 ENCOUNTER — Other Ambulatory Visit: Payer: Self-pay

## 2019-05-12 ENCOUNTER — Emergency Department (HOSPITAL_COMMUNITY)
Admission: EM | Admit: 2019-05-12 | Discharge: 2019-05-12 | Disposition: A | Discharge status: Home / Self Care | Payer: BC Managed Care – PPO | Attending: Emergency Medicine | Admitting: Emergency Medicine

## 2019-05-12 DIAGNOSIS — E119 Type 2 diabetes mellitus without complications: Secondary | ICD-10-CM | POA: Diagnosis not present

## 2019-05-12 DIAGNOSIS — R8271 Bacteriuria: Secondary | ICD-10-CM | POA: Diagnosis not present

## 2019-05-12 DIAGNOSIS — Z7984 Long term (current) use of oral hypoglycemic drugs: Secondary | ICD-10-CM | POA: Insufficient documentation

## 2019-05-12 DIAGNOSIS — R51 Headache: Secondary | ICD-10-CM | POA: Insufficient documentation

## 2019-05-12 DIAGNOSIS — R519 Headache, unspecified: Secondary | ICD-10-CM

## 2019-05-12 LAB — CBC WITH DIFFERENTIAL/PLATELET
Abs Immature Granulocytes: 0.03 10*3/uL (ref 0.00–0.07)
Basophils Absolute: 0 10*3/uL (ref 0.0–0.1)
Basophils Relative: 0 %
Eosinophils Absolute: 0.2 10*3/uL (ref 0.0–0.5)
Eosinophils Relative: 2 %
HCT: 35.5 % — ABNORMAL LOW (ref 36.0–46.0)
Hemoglobin: 11.4 g/dL — ABNORMAL LOW (ref 12.0–15.0)
Immature Granulocytes: 0 %
Lymphocytes Relative: 13 %
Lymphs Abs: 0.9 10*3/uL (ref 0.7–4.0)
MCH: 26.1 pg (ref 26.0–34.0)
MCHC: 32.1 g/dL (ref 30.0–36.0)
MCV: 81.4 fL (ref 80.0–100.0)
Monocytes Absolute: 0.4 10*3/uL (ref 0.1–1.0)
Monocytes Relative: 7 %
Neutro Abs: 5.2 10*3/uL (ref 1.7–7.7)
Neutrophils Relative %: 78 %
Platelets: 222 10*3/uL (ref 150–400)
RBC: 4.36 MIL/uL (ref 3.87–5.11)
RDW: 15 % (ref 11.5–15.5)
WBC: 6.7 10*3/uL (ref 4.0–10.5)
nRBC: 0 % (ref 0.0–0.2)

## 2019-05-12 LAB — COMPREHENSIVE METABOLIC PANEL
ALT: 14 U/L (ref 0–44)
AST: 15 U/L (ref 15–41)
Albumin: 4 g/dL (ref 3.5–5.0)
Alkaline Phosphatase: 72 U/L (ref 38–126)
Anion gap: 10 (ref 5–15)
BUN: 8 mg/dL (ref 6–20)
CO2: 24 mmol/L (ref 22–32)
Calcium: 9.4 mg/dL (ref 8.9–10.3)
Chloride: 101 mmol/L (ref 98–111)
Creatinine, Ser: 0.5 mg/dL (ref 0.44–1.00)
GFR calc Af Amer: >60 mL/min (ref >60)
GFR calc non Af Amer: >60 mL/min (ref >60)
Glucose, Bld: 316 mg/dL — ABNORMAL HIGH (ref 70–99)
Potassium: 3.8 mmol/L (ref 3.5–5.1)
Sodium: 135 mmol/L (ref 135–145)
Total Bilirubin: 1.2 mg/dL (ref 0.3–1.2)
Total Protein: 6.8 g/dL (ref 6.5–8.1)

## 2019-05-12 LAB — I-STAT BETA HCG BLOOD, ED (MC, WL, AP ONLY): I-stat hCG, quantitative: <5 m[IU]/mL (ref <5)

## 2019-05-12 LAB — URINALYSIS, ROUTINE W REFLEX MICROSCOPIC
Bilirubin Urine: NEGATIVE
Glucose, UA: >=500 mg/dL — AB
Ketones, ur: NEGATIVE mg/dL
Nitrite: NEGATIVE
Protein, ur: 100 mg/dL — AB
RBC / HPF: >50 RBC/hpf — ABNORMAL HIGH (ref 0–5)
Specific Gravity, Urine: 1.025 (ref 1.005–1.030)
WBC, UA: >50 WBC/hpf — ABNORMAL HIGH (ref 0–5)
pH: 5 (ref 5.0–8.0)

## 2019-05-12 LAB — LIPASE, BLOOD: Lipase: 25 U/L (ref 11–51)

## 2019-05-12 MED ORDER — BUTALBITAL-APAP-CAFFEINE 50-325-40 MG PO TABS
1.0000 | ORAL_TABLET | Freq: Once | ORAL | Status: DC
  Filled 2019-05-12: qty 1

## 2019-05-12 MED ORDER — ACETAMINOPHEN 325 MG PO TABS
650.0000 mg | ORAL_TABLET | Freq: Once | ORAL | Status: AC
  Administered 2019-05-12: 08:00:00 650 mg via ORAL
  Filled 2019-05-12: qty 2

## 2019-05-12 MED ORDER — KETOROLAC TROMETHAMINE 30 MG/ML IJ SOLN
30.0000 mg | Freq: Once | INTRAMUSCULAR | Status: AC
  Administered 2019-05-12: 07:00:00 30 mg via INTRAVENOUS
  Filled 2019-05-12: qty 1

## 2019-05-12 MED ORDER — DIPHENHYDRAMINE HCL 50 MG/ML IJ SOLN
12.5000 mg | Freq: Once | INTRAMUSCULAR | Status: AC
  Administered 2019-05-12: 07:00:00 12.5 mg via INTRAVENOUS
  Filled 2019-05-12: qty 1

## 2019-05-12 MED ORDER — CEPHALEXIN 500 MG PO CAPS: 500.0000 mg | ORAL_CAPSULE | Freq: Three times a day (TID) | ORAL | 0 refills | Status: DC

## 2019-05-12 MED ORDER — SODIUM CHLORIDE 0.9 % IV BOLUS
1000.0000 mL | Freq: Once | INTRAVENOUS | Status: AC
  Administered 2019-05-12: 07:00:00 1000 mL via INTRAVENOUS

## 2019-05-12 MED ORDER — PROCHLORPERAZINE EDISYLATE 10 MG/2ML IJ SOLN
10.0000 mg | Freq: Once | INTRAMUSCULAR | Status: AC
  Administered 2019-05-12: 07:00:00 10 mg via INTRAVENOUS
  Filled 2019-05-12: qty 2

## 2019-05-12 NOTE — ED Notes (Signed)
Pt wakes with multiple voice attempts. Pt rouses and gives name but words are slow and slurred though comprehensible. Pt states HA 4/10. Will hold fiorecet due to sleepiness. Provider informed of same and agrees to hold after informed of pain scale.
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
## 2019-05-12 NOTE — ED Triage Notes (Signed)
The pt is c/o abd pain with n v and diarrhea since yesterday  lmp now

## 2019-05-12 NOTE — ED Notes (Signed)
Discharge instructions reviewed, medication discussed and education provided.  Patient was ambulatory without difficulty at discharge.

## 2019-05-12 NOTE — Discharge Instructions (Signed)
You were seen in the ER for headache and abdominal pain.   Urine had bacteria, so we will treat you with antibiotic for possible UTI. We have sent the urine to the lab to confirm infection  Alternate [phone] mg acetaminophen every 6-8 hours for pain.  It is important for you to restart all your medications today, contact your primary care doctor to make an appointment medication refills  Return for fever greater than 100, severe sudden or different headache, vision changes or loss, stroke symptoms like weakness, numbness difficulty with speech, swallowing or walking, neck pain or rigidity, passing out

## 2019-05-12 NOTE — ED Provider Notes (Signed)
0700: Patient handed off to me by previous ED PA at shift change pending lab work, reassessment.  See previous note for full H&P.  Briefly, patient has history of right CVAs, chronic headaches, frequent ER visits presents ER for evaluation of headache onset 3 AM today.  Initial triage no documented abdominal pain but patient clarify this is related to photophobia, nausea related to the headache.  Plan is to follow-up on labs, reassess.  Anticipate discharge.  No indication for emergent head/brain imaging per previous team.  No fever, meningismus, head trauma, anticoagulants. Physical Exam  BP 119/87   Pulse 87   Temp 98.3 F (36.8 C) (Oral)   Resp 18   Ht 5\' 2"  (1.575 m)   Wt 89.8 kg   LMP 04/16/2019 (Exact Date)   SpO2 94%   BMI 36.21 kg/m   Physical Exam Constitutional:      Appearance: She is well-developed.  HENT:     Head: Normocephalic.     Nose: Nose normal.  Eyes:     General: Lids are normal.  Neck:     Musculoskeletal: Normal range of motion.  Cardiovascular:     Rate and Rhythm: Normal rate.  Pulmonary:     Effort: Pulmonary effort is normal. No respiratory distress.  Abdominal:     General: Abdomen is flat.     Palpations: Abdomen is soft.     Tenderness: There is no abdominal tenderness.     Comments: No suprapubic or CVA tenderness  Musculoskeletal: Normal range of motion.  Neurological:     Mental Status: She is alert.     Comments: GCS 15. Speech is fluent without dysarthria, aphasia. Strength and sensation intact in upper/lower extremities. Spontaneous movements of all extremities without obvious deficit or ataxia.   Psychiatric:        Behavior: Behavior normal.     ED Course/Procedures   Clinical Course as of May 11 953  Mon May 12, 2019  0724 No AG  Glucose(!): 316 [CG]  0728 Pt re-evaluated. Found asleep, arousable to physical stimulus. Reports feeling very tired. Persistent unchanged headache, no more nausea, photophobia. Endorses intermittent  dysuria but no hematuria. No more abdominal pain.    [CG]  0730 MRI 09/2018 reviewed  1. No acute intracranial infarct or other abnormality identified. 2. Patchy small volume chronic right MCA territory infarcts, predominantly watershed in distribution. 3. 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 4. Underlying mild chronic microvascular ischemic disease.   [CG]  0901 UA sample still not collected. Agricultural consultant notified.    [CG]  0946 WBC, UA(!): >50 [CG]  0946 Chalmers Guest): SMALL [CG]  0946 Bacteria, UA(!): RARE [CG]  0946 Squamous Epithelial / LPF: 0-5 [CG]    Clinical Course User Index [CG] Kinnie Feil, PA-C    Procedures  MDM   [phone]: Pt re-evaluated, no abdominal pain, nausea but reports dysuria. Will get UA.  HA not significantly improved, will give tylenol and fioricet.   5038: UA as above concerning for infection especially given dysuria, will rx keflex and send for culture. Pt re-evaluated, reports HA 4/10, no longer abdominal pain. Will dc with keflex. It appears pt is not taking any of her other meds, encouraged f/u with PCP to resume these. Return precautions given. Pt comfortable and in agreement with plan.     Kinnie Feil, PA-C 05/12/19 8828    Blanchie Dessert, MD 05/12/19 2145

## 2019-05-13 LAB — URINE CULTURE

## 2019-07-04 ENCOUNTER — Emergency Department (HOSPITAL_COMMUNITY)
Admission: EM | Admit: 2019-07-04 | Discharge: 2019-07-04 | Disposition: A | Discharge status: Home / Self Care | Payer: BC Managed Care – PPO | Attending: Emergency Medicine | Admitting: Emergency Medicine

## 2019-07-04 ENCOUNTER — Other Ambulatory Visit: Payer: Self-pay

## 2019-07-04 ENCOUNTER — Encounter (HOSPITAL_COMMUNITY): Payer: Self-pay | Admitting: Emergency Medicine

## 2019-07-04 DIAGNOSIS — E119 Type 2 diabetes mellitus without complications: Secondary | ICD-10-CM | POA: Insufficient documentation

## 2019-07-04 DIAGNOSIS — Z79899 Other long term (current) drug therapy: Secondary | ICD-10-CM | POA: Insufficient documentation

## 2019-07-04 DIAGNOSIS — M545 Low back pain, unspecified: Secondary | ICD-10-CM

## 2019-07-04 DIAGNOSIS — Z8673 Personal history of transient ischemic attack (TIA), and cerebral infarction without residual deficits: Secondary | ICD-10-CM | POA: Insufficient documentation

## 2019-07-04 DIAGNOSIS — M549 Dorsalgia, unspecified: Secondary | ICD-10-CM | POA: Diagnosis present

## 2019-07-04 DIAGNOSIS — Z7982 Long term (current) use of aspirin: Secondary | ICD-10-CM | POA: Insufficient documentation

## 2019-07-04 MED ORDER — MELOXICAM 7.5 MG PO TABS: 7.5000 mg | ORAL_TABLET | Freq: Every day | ORAL | 0 refills | Status: AC

## 2019-07-04 MED ORDER — KETOROLAC TROMETHAMINE 30 MG/ML IJ SOLN
30.0000 mg | Freq: Once | INTRAMUSCULAR | Status: AC
  Administered 2019-07-04: 30 mg via INTRAMUSCULAR
  Filled 2019-07-04: qty 1

## 2019-07-04 MED ORDER — METHOCARBAMOL 500 MG PO TABS: 500.0000 mg | ORAL_TABLET | Freq: Two times a day (BID) | ORAL | 0 refills | Status: AC

## 2019-07-04 MED ORDER — METHOCARBAMOL 500 MG PO TABS
750.0000 mg | ORAL_TABLET | Freq: Once | ORAL | Status: AC
  Administered 2019-07-04: 750 mg via ORAL
  Filled 2019-07-04: qty 2
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
## 2019-07-04 NOTE — Discharge Instructions (Addendum)
I have prescribed a short course of anti-inflammatories along with muscle relaxers to help with your back pain, please take this medication as prescribed  If you experience any weakness, fevers, worsening symptoms please return to the emergency department.  Follow-up with your primary care physician as needed.

## 2019-07-04 NOTE — ED Triage Notes (Signed)
Pt c/o back pain x 1 week. Ambulatory without difficulty.

## 2019-07-07 ENCOUNTER — Emergency Department (HOSPITAL_COMMUNITY)
Admission: EM | Admit: 2019-07-07 | Discharge: 2019-07-07 | Disposition: A | Discharge status: Home / Self Care | Payer: BC Managed Care – PPO | Attending: Emergency Medicine | Admitting: Emergency Medicine

## 2019-07-07 ENCOUNTER — Encounter (HOSPITAL_COMMUNITY): Payer: Self-pay

## 2019-07-07 DIAGNOSIS — R300 Dysuria: Secondary | ICD-10-CM | POA: Insufficient documentation

## 2019-07-07 DIAGNOSIS — G43909 Migraine, unspecified, not intractable, without status migrainosus: Secondary | ICD-10-CM | POA: Diagnosis not present

## 2019-07-07 DIAGNOSIS — E119 Type 2 diabetes mellitus without complications: Secondary | ICD-10-CM | POA: Insufficient documentation

## 2019-07-07 DIAGNOSIS — Z79899 Other long term (current) drug therapy: Secondary | ICD-10-CM | POA: Diagnosis not present

## 2019-07-07 DIAGNOSIS — R51 Headache: Secondary | ICD-10-CM | POA: Diagnosis present

## 2019-07-07 DIAGNOSIS — Z7984 Long term (current) use of oral hypoglycemic drugs: Secondary | ICD-10-CM | POA: Insufficient documentation

## 2019-07-07 LAB — COMPREHENSIVE METABOLIC PANEL
ALT: 14 U/L (ref 0–44)
AST: 16 U/L (ref 15–41)
Albumin: 3.9 g/dL (ref 3.5–5.0)
Alkaline Phosphatase: 65 U/L (ref 38–126)
Anion gap: 9 (ref 5–15)
BUN: 9 mg/dL (ref 6–20)
CO2: 22 mmol/L (ref 22–32)
Calcium: 9.2 mg/dL (ref 8.9–10.3)
Chloride: 104 mmol/L (ref 98–111)
Creatinine, Ser: 0.48 mg/dL (ref 0.44–1.00)
GFR calc Af Amer: >60 mL/min (ref >60)
GFR calc non Af Amer: >60 mL/min (ref >60)
Glucose, Bld: 300 mg/dL — ABNORMAL HIGH (ref 70–99)
Potassium: 3.9 mmol/L (ref 3.5–5.1)
Sodium: 135 mmol/L (ref 135–145)
Total Bilirubin: 0.9 mg/dL (ref 0.3–1.2)
Total Protein: 6.6 g/dL (ref 6.5–8.1)

## 2019-07-07 LAB — CBC
HCT: 34.5 % — ABNORMAL LOW (ref 36.0–46.0)
Hemoglobin: 11.3 g/dL — ABNORMAL LOW (ref 12.0–15.0)
MCH: 26.3 pg (ref 26.0–34.0)
MCHC: 32.8 g/dL (ref 30.0–36.0)
MCV: 80.2 fL (ref 80.0–100.0)
Platelets: 224 10*3/uL (ref 150–400)
RBC: 4.3 MIL/uL (ref 3.87–5.11)
RDW: 15.9 % — ABNORMAL HIGH (ref 11.5–15.5)
WBC: 7.3 10*3/uL (ref 4.0–10.5)
nRBC: 0 % (ref 0.0–0.2)

## 2019-07-07 LAB — URINALYSIS, ROUTINE W REFLEX MICROSCOPIC
Bilirubin Urine: NEGATIVE
Bilirubin Urine: NEGATIVE
Glucose, UA: >=500 mg/dL — AB
Glucose, UA: >=500 mg/dL — AB
Hgb urine dipstick: NEGATIVE
Hgb urine dipstick: NEGATIVE
Ketones, ur: NEGATIVE mg/dL
Ketones, ur: NEGATIVE mg/dL
Nitrite: NEGATIVE
Nitrite: NEGATIVE
Protein, ur: NEGATIVE mg/dL
Protein, ur: NEGATIVE mg/dL
Specific Gravity, Urine: 1.025 (ref 1.005–1.030)
Specific Gravity, Urine: 1.026 (ref 1.005–1.030)
pH: 5 (ref 5.0–8.0)
pH: 5 (ref 5.0–8.0)

## 2019-07-07 LAB — CBG MONITORING, ED: Glucose-Capillary: 222 mg/dL — ABNORMAL HIGH (ref 70–99)

## 2019-07-07 LAB — LIPASE, BLOOD: Lipase: 27 U/L (ref 11–51)

## 2019-07-07 LAB — I-STAT BETA HCG BLOOD, ED (MC, WL, AP ONLY): I-stat hCG, quantitative: <5 m[IU]/mL (ref <5)

## 2019-07-07 MED ORDER — DIPHENHYDRAMINE HCL 50 MG/ML IJ SOLN
25.0000 mg | Freq: Once | INTRAMUSCULAR | Status: AC
  Administered 2019-07-07: 25 mg via INTRAVENOUS
  Filled 2019-07-07: qty 1

## 2019-07-07 MED ORDER — PROCHLORPERAZINE EDISYLATE 10 MG/2ML IJ SOLN
10.0000 mg | Freq: Once | INTRAMUSCULAR | Status: AC
  Administered 2019-07-07: 07:00:00 10 mg via INTRAVENOUS
  Filled 2019-07-07: qty 2

## 2019-07-07 MED ORDER — SODIUM CHLORIDE 0.9 % IV BOLUS
1000.0000 mL | Freq: Once | INTRAVENOUS | Status: AC
  Administered 2019-07-07: 08:00:00 1000 mL via INTRAVENOUS

## 2019-07-07 MED ORDER — CEPHALEXIN 500 MG PO CAPS: 500.0000 mg | ORAL_CAPSULE | Freq: Two times a day (BID) | ORAL | 0 refills | Status: DC

## 2019-07-07 MED ORDER — SODIUM CHLORIDE 0.9% FLUSH: 3.0000 mL | Freq: Once | INTRAVENOUS | Status: DC

## 2019-07-07 MED ORDER — KETOROLAC TROMETHAMINE 30 MG/ML IJ SOLN
30.0000 mg | Freq: Once | INTRAMUSCULAR | Status: AC
  Administered 2019-07-07: 30 mg via INTRAVENOUS
  Filled 2019-07-07: qty 1

## 2019-07-07 NOTE — ED Notes (Signed)
Pt verbalized understanding of discharge paperwork, prescriptions adn follow up care

## 2019-07-07 NOTE — ED Triage Notes (Signed)
Onset yesterday headache and nausea, took ibuprofen with no relief.

## 2019-07-07 NOTE — ED Provider Notes (Signed)
Joann Meyers is a 33 y.o. female w/ hx of diabetes and stroke, coming in with headache, which is typical for her with hx of migraines.  Also reporting some dysuria, she denies vaginal discharge or bleeding.  Has had some low back pain, no abdominal pain or flank pain.  Labs ordered and patient given migraine cocktail.  Normal neurologic exam, no concern for stroke, head imaging not indicated given headache is consistent with chronic migraine.  Labs significant for glucose of 300, no other electrolyte derangements, no anion gap to suggest DKA, urinalysis with glucose present but no ketones.  Urinalysis also with large leukocytes and some red and white cells although many squames present suggestive of contamination, will recollect a urine sample.  Culture sent as well.  No leukocytosis and stable hemoglobin.  Getting fluids for hyperglycemia, recheck blood sugar and reassess pain after headache cocktail.  Follow-up on repeat urine.  Physical Exam  BP 115/88 (BP Location: Right Arm)   Pulse 85   Temp 98.5 F (36.9 C) (Oral)   Resp 18   LMP 06/10/2019   SpO2 99%   Physical Exam Vitals signs and nursing note reviewed.  Constitutional:      General: She is not in acute distress.    Appearance: She is well-developed. She is not diaphoretic.     Comments: Sleeping comfortably, no acute distress, easily awakens.  HENT:     Head: Normocephalic and atraumatic.  Eyes:     General:        Right eye: No discharge.        Left eye: No discharge.  Pulmonary:     Effort: Pulmonary effort is normal. No respiratory distress.  Neurological:     Mental Status: She is alert.     Coordination: Coordination normal.  Psychiatric:        Behavior: Behavior normal.     ED Course/Procedures   Labs Reviewed  COMPREHENSIVE METABOLIC PANEL - Abnormal; Notable for the following components:      Result Value   Glucose, Bld 300 (*)    All other components within normal limits  CBC - Abnormal; Notable  for the following components:   Hemoglobin 11.3 (*)    HCT 34.5 (*)    RDW 15.9 (*)    All other components within normal limits  URINALYSIS, ROUTINE W REFLEX MICROSCOPIC - Abnormal; Notable for the following components:   APPearance CLOUDY (*)    Glucose, UA >=500 (*)    Leukocytes,Ua LARGE (*)    Bacteria, UA RARE (*)    All other components within normal limits  URINALYSIS, ROUTINE W REFLEX MICROSCOPIC - Abnormal; Notable for the following components:   APPearance HAZY (*)    Glucose, UA >=500 (*)    Leukocytes,Ua SMALL (*)    Bacteria, UA FEW (*)    All other components within normal limits  CBG MONITORING, ED - Abnormal; Notable for the following components:   Glucose-Capillary 222 (*)    All other components within normal limits  URINE CULTURE  LIPASE, BLOOD  I-STAT BETA HCG BLOOD, ED ([facility], [facility], [facility] ONLY)    Procedures  MDM   On reevaluation headache has completely resolved and patient is feeling much better.  She is receiving IV fluids and has not yet been able to give repeat urine sample.  Urinalysis overall looks improved although with some signs of contamination still showing some bacteria and leukocytes present with 6-10 WBCs, given that patient is symptomatic, will go ahead and  treat with short course of antibiotics for UTI, culture has been sent.  Repeat CBG improving at 220 will have patient return home and take her regular diabetes medications.  Return precautions and PCP follow-up discussed.  At this time patient is stable for discharge home.  Provided work note.  Final diagnoses:  Migraine without status migrainosus, not intractable, unspecified migraine type  Dysuria    ED Discharge Orders         Ordered    cephALEXin (KEFLEX) 500 MG capsule  2 times daily     07/07/19 1013             Benedetto Goad [place], [place] 07/07/19 1014    Charlesetta Shanks, MD 07/07/19 1455

## 2019-07-07 NOTE — Discharge Instructions (Signed)
I am glad that your headache has improved.  Your blood sugar was elevated today but is improved with IV fluids, please make sure you are taking your diabetes medications at home.  Urine shows some mild signs of infection since you are having symptoms we will treat with antibiotics, please take twice daily for the next week.  Is important that you follow-up with your PCP if dysuria is not improving.  Return to the ED for any new or worsening symptoms.
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
## 2019-07-07 NOTE — ED Notes (Signed)
Urine culture sent to lab with urine 

## 2019-07-08 LAB — URINE CULTURE

## 2019-07-10 ENCOUNTER — Other Ambulatory Visit: Payer: Self-pay

## 2019-07-10 ENCOUNTER — Emergency Department (HOSPITAL_COMMUNITY): Payer: BC Managed Care – PPO

## 2019-07-10 ENCOUNTER — Emergency Department (HOSPITAL_COMMUNITY)
Admission: EM | Admit: 2019-07-10 | Discharge: 2019-07-10 | Disposition: A | Discharge status: Home / Self Care | Payer: BC Managed Care – PPO | Attending: Emergency Medicine | Admitting: Emergency Medicine

## 2019-07-10 ENCOUNTER — Encounter (HOSPITAL_COMMUNITY): Payer: Self-pay | Admitting: Emergency Medicine

## 2019-07-10 DIAGNOSIS — R200 Anesthesia of skin: Secondary | ICD-10-CM | POA: Insufficient documentation

## 2019-07-10 DIAGNOSIS — R531 Weakness: Secondary | ICD-10-CM | POA: Diagnosis present

## 2019-07-10 DIAGNOSIS — Z7982 Long term (current) use of aspirin: Secondary | ICD-10-CM | POA: Diagnosis not present

## 2019-07-10 DIAGNOSIS — R079 Chest pain, unspecified: Secondary | ICD-10-CM | POA: Insufficient documentation

## 2019-07-10 DIAGNOSIS — N3000 Acute cystitis without hematuria: Secondary | ICD-10-CM

## 2019-07-10 DIAGNOSIS — E119 Type 2 diabetes mellitus without complications: Secondary | ICD-10-CM | POA: Diagnosis not present

## 2019-07-10 DIAGNOSIS — I639 Cerebral infarction, unspecified: Secondary | ICD-10-CM

## 2019-07-10 DIAGNOSIS — Z7984 Long term (current) use of oral hypoglycemic drugs: Secondary | ICD-10-CM | POA: Diagnosis not present

## 2019-07-10 DIAGNOSIS — Z79899 Other long term (current) drug therapy: Secondary | ICD-10-CM | POA: Insufficient documentation

## 2019-07-10 LAB — I-STAT BETA HCG BLOOD, ED (MC, WL, AP ONLY): I-stat hCG, quantitative: <5 m[IU]/mL (ref <5)

## 2019-07-10 LAB — BASIC METABOLIC PANEL
Anion gap: 12 (ref 5–15)
BUN: 15 mg/dL (ref 6–20)
CO2: 20 mmol/L — ABNORMAL LOW (ref 22–32)
Calcium: 8.8 mg/dL — ABNORMAL LOW (ref 8.9–10.3)
Chloride: 103 mmol/L (ref 98–111)
Creatinine, Ser: 0.51 mg/dL (ref 0.44–1.00)
GFR calc Af Amer: >60 mL/min (ref >60)
GFR calc non Af Amer: >60 mL/min (ref >60)
Glucose, Bld: 344 mg/dL — ABNORMAL HIGH (ref 70–99)
Potassium: 4 mmol/L (ref 3.5–5.1)
Sodium: 135 mmol/L (ref 135–145)

## 2019-07-10 LAB — CBC WITH DIFFERENTIAL/PLATELET
Abs Immature Granulocytes: 0.04 10*3/uL (ref 0.00–0.07)
Basophils Absolute: 0.1 10*3/uL (ref 0.0–0.1)
Basophils Relative: 1 %
Eosinophils Absolute: 0.2 10*3/uL (ref 0.0–0.5)
Eosinophils Relative: 3 %
HCT: 32.5 % — ABNORMAL LOW (ref 36.0–46.0)
Hemoglobin: 10.5 g/dL — ABNORMAL LOW (ref 12.0–15.0)
Immature Granulocytes: 1 %
Lymphocytes Relative: 12 %
Lymphs Abs: 0.8 10*3/uL (ref 0.7–4.0)
MCH: 26.3 pg (ref 26.0–34.0)
MCHC: 32.3 g/dL (ref 30.0–36.0)
MCV: 81.3 fL (ref 80.0–100.0)
Monocytes Absolute: 0.5 10*3/uL (ref 0.1–1.0)
Monocytes Relative: 7 %
Neutro Abs: 5.3 10*3/uL (ref 1.7–7.7)
Neutrophils Relative %: 76 %
Platelets: 185 10*3/uL (ref 150–400)
RBC: 4 MIL/uL (ref 3.87–5.11)
RDW: 15.5 % (ref 11.5–15.5)
WBC: 6.8 10*3/uL (ref 4.0–10.5)
nRBC: 0 % (ref 0.0–0.2)

## 2019-07-10 LAB — URINALYSIS, ROUTINE W REFLEX MICROSCOPIC
Bilirubin Urine: NEGATIVE
Glucose, UA: >=500 mg/dL — AB
Ketones, ur: NEGATIVE mg/dL
Nitrite: NEGATIVE
Protein, ur: NEGATIVE mg/dL
Specific Gravity, Urine: >1.046 — ABNORMAL HIGH (ref 1.005–1.030)
pH: 5 (ref 5.0–8.0)

## 2019-07-10 LAB — TROPONIN I (HIGH SENSITIVITY): Troponin I (High Sensitivity): 3 ng/L (ref <18)

## 2019-07-10 MED ORDER — ACETAMINOPHEN 325 MG PO TABS
650.0000 mg | ORAL_TABLET | Freq: Once | ORAL | Status: AC
  Administered 2019-07-10: 09:00:00 650 mg via ORAL
  Filled 2019-07-10: qty 2

## 2019-07-10 MED ORDER — CEPHALEXIN 500 MG PO CAPS: 500.0000 mg | ORAL_CAPSULE | Freq: Two times a day (BID) | ORAL | 0 refills | Status: AC

## 2019-07-10 MED ORDER — IOHEXOL 350 MG/ML SOLN
75.0000 mL | Freq: Once | INTRAVENOUS | Status: AC | PRN
  Administered 2019-07-10: 75 mL via INTRAVENOUS

## 2019-07-10 MED ORDER — KETOROLAC TROMETHAMINE 15 MG/ML IJ SOLN: 15.0000 mg | Freq: Once | INTRAMUSCULAR | Status: DC

## 2019-07-10 MED ORDER — SODIUM CHLORIDE 0.9 % IV BOLUS
1000.0000 mL | Freq: Once | INTRAVENOUS | Status: AC
  Administered 2019-07-10: 08:00:00 1000 mL via INTRAVENOUS
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
## 2019-07-10 NOTE — ED Notes (Signed)
Patient transported to CT 

## 2019-07-10 NOTE — ED Triage Notes (Signed)
Pt BIB [facility] from home. Pt complaining of weakness and chest pain starting at 0630 this morning. Pain worse upon exertion. Pt cbg 327 upon EMS arrival. Pt received 324 aspirin via EMS. Pt does have history of stroke and TIA.

## 2019-07-10 NOTE — ED Notes (Signed)
Pt discharge paperwork reviewed. Pt verbalized understanding of discharge instructions. No signature pad available, pt unable to sign. Pt discharged.

## 2019-07-14 ENCOUNTER — Other Ambulatory Visit: Payer: Self-pay

## 2019-07-14 ENCOUNTER — Encounter (HOSPITAL_COMMUNITY): Payer: Self-pay

## 2019-07-14 ENCOUNTER — Ambulatory Visit (HOSPITAL_COMMUNITY)
Admission: EM | Admit: 2019-07-14 | Discharge: 2019-07-14 | Disposition: A | Discharge status: Home / Self Care | Payer: BC Managed Care – PPO | Attending: Family Medicine | Admitting: Family Medicine

## 2019-07-14 DIAGNOSIS — R21 Rash and other nonspecific skin eruption: Secondary | ICD-10-CM | POA: Diagnosis not present

## 2019-07-14 NOTE — ED Triage Notes (Signed)
Patient presents to Urgent Care with complaints of itchy rash on her left hand since this morning. Patient reports she put some Claritin gel on her hand, and the itching went away.
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
## 2019-07-14 NOTE — Discharge Instructions (Signed)
You may continue with topical claritin and thin/light amount of topical hydrocortisone cream.  Avoid over-washing of your hands as able.  Please return if this worsens, becomes painful, or doesn't improve in the next 2 weeks.  You may also follow up with dermatology at our [facility] which has dermatology hours.

## 2019-08-05 ENCOUNTER — Emergency Department (HOSPITAL_COMMUNITY)
Admission: EM | Admit: 2019-08-05 | Discharge: 2019-08-05 | Disposition: A | Discharge status: Home / Self Care | Payer: BC Managed Care – PPO | Attending: Emergency Medicine | Admitting: Emergency Medicine

## 2019-08-05 ENCOUNTER — Encounter (HOSPITAL_COMMUNITY): Payer: Self-pay

## 2019-08-05 ENCOUNTER — Other Ambulatory Visit: Payer: Self-pay

## 2019-08-05 ENCOUNTER — Emergency Department (HOSPITAL_COMMUNITY): Payer: BC Managed Care – PPO

## 2019-08-05 DIAGNOSIS — Z8673 Personal history of transient ischemic attack (TIA), and cerebral infarction without residual deficits: Secondary | ICD-10-CM | POA: Diagnosis not present

## 2019-08-05 DIAGNOSIS — Z79899 Other long term (current) drug therapy: Secondary | ICD-10-CM | POA: Diagnosis not present

## 2019-08-05 DIAGNOSIS — E119 Type 2 diabetes mellitus without complications: Secondary | ICD-10-CM | POA: Insufficient documentation

## 2019-08-05 DIAGNOSIS — Z7982 Long term (current) use of aspirin: Secondary | ICD-10-CM | POA: Diagnosis not present

## 2019-08-05 DIAGNOSIS — R0789 Other chest pain: Secondary | ICD-10-CM | POA: Insufficient documentation

## 2019-08-05 DIAGNOSIS — Z7984 Long term (current) use of oral hypoglycemic drugs: Secondary | ICD-10-CM | POA: Diagnosis not present

## 2019-08-05 DIAGNOSIS — R079 Chest pain, unspecified: Secondary | ICD-10-CM

## 2019-08-05 LAB — CBC
HCT: 34.4 % — ABNORMAL LOW (ref 36.0–46.0)
Hemoglobin: 11.2 g/dL — ABNORMAL LOW (ref 12.0–15.0)
MCH: 26.1 pg (ref 26.0–34.0)
MCHC: 32.6 g/dL (ref 30.0–36.0)
MCV: 80.2 fL (ref 80.0–100.0)
Platelets: 243 10*3/uL (ref 150–400)
RBC: 4.29 MIL/uL (ref 3.87–5.11)
RDW: 15.5 % (ref 11.5–15.5)
WBC: 9.3 10*3/uL (ref 4.0–10.5)
nRBC: 0 % (ref 0.0–0.2)

## 2019-08-05 LAB — BASIC METABOLIC PANEL
Anion gap: 10 (ref 5–15)
BUN: 12 mg/dL (ref 6–20)
CO2: 22 mmol/L (ref 22–32)
Calcium: 9.3 mg/dL (ref 8.9–10.3)
Chloride: 104 mmol/L (ref 98–111)
Creatinine, Ser: 0.5 mg/dL (ref 0.44–1.00)
GFR calc Af Amer: >60 mL/min (ref >60)
GFR calc non Af Amer: >60 mL/min (ref >60)
Glucose, Bld: 260 mg/dL — ABNORMAL HIGH (ref 70–99)
Potassium: 3.7 mmol/L (ref 3.5–5.1)
Sodium: 136 mmol/L (ref 135–145)

## 2019-08-05 LAB — I-STAT BETA HCG BLOOD, ED (MC, WL, AP ONLY): I-stat hCG, quantitative: <5 m[IU]/mL (ref <5)

## 2019-08-05 LAB — TROPONIN I (HIGH SENSITIVITY)
Troponin I (High Sensitivity): <2 ng/L (ref <18)
Troponin I (High Sensitivity): 3 ng/L (ref <18)

## 2019-08-05 LAB — LIPASE, BLOOD: Lipase: 41 U/L (ref 11–51)

## 2019-08-05 MED ORDER — KETOROLAC TROMETHAMINE 30 MG/ML IJ SOLN
15.0000 mg | Freq: Once | INTRAMUSCULAR | Status: AC
  Administered 2019-08-05: 14:00:00 15 mg via INTRAVENOUS
  Filled 2019-08-05: qty 1

## 2019-08-05 MED ORDER — ONDANSETRON 4 MG PO TBDP: 4.0000 mg | ORAL_TABLET | Freq: Once | ORAL | Status: DC | PRN

## 2019-08-05 NOTE — ED Notes (Addendum)
Pt reports she is menstruating when asked to provide urine sample.

## 2019-08-05 NOTE — ED Notes (Signed)
Pt denies nausea at this time.
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
## 2019-08-05 NOTE — ED Triage Notes (Signed)
patient complains CP since 0930 today. Denies illness, denies trauma. Patient alert and oriente NAD. Patient states pain worse with inspiration and movement

## 2019-08-06 ENCOUNTER — Encounter (HOSPITAL_COMMUNITY): Payer: Self-pay | Admitting: Emergency Medicine

## 2019-08-06 ENCOUNTER — Other Ambulatory Visit: Payer: Self-pay

## 2019-08-06 ENCOUNTER — Ambulatory Visit (HOSPITAL_COMMUNITY)
Admission: EM | Admit: 2019-08-06 | Discharge: 2019-08-06 | Disposition: A | Discharge status: Home / Self Care | Payer: BC Managed Care – PPO | Attending: Urgent Care | Admitting: Urgent Care

## 2019-08-06 DIAGNOSIS — R21 Rash and other nonspecific skin eruption: Secondary | ICD-10-CM | POA: Diagnosis not present

## 2019-08-06 DIAGNOSIS — L299 Pruritus, unspecified: Secondary | ICD-10-CM

## 2019-08-06 MED ORDER — TRIAMCINOLONE ACETONIDE 0.1 % EX CREA: 1.0000 "application " | TOPICAL_CREAM | Freq: Two times a day (BID) | CUTANEOUS | 0 refills | Status: DC
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
## 2019-08-06 NOTE — ED Triage Notes (Signed)
PT was here 1 month ago for a rash on her hand. PT used a ceram and it resolved. Rash reappeared this morning.

## 2019-08-12 ENCOUNTER — Ambulatory Visit (HOSPITAL_COMMUNITY)
Admission: EM | Admit: 2019-08-12 | Discharge: 2019-08-12 | Disposition: A | Discharge status: Home / Self Care | Payer: BC Managed Care – PPO | Attending: Family Medicine | Admitting: Family Medicine

## 2019-08-12 ENCOUNTER — Encounter (HOSPITAL_COMMUNITY): Payer: Self-pay

## 2019-08-12 ENCOUNTER — Other Ambulatory Visit: Payer: Self-pay

## 2019-08-12 DIAGNOSIS — Z20822 Contact with and (suspected) exposure to covid-19: Secondary | ICD-10-CM

## 2019-08-12 DIAGNOSIS — Z20828 Contact with and (suspected) exposure to other viral communicable diseases: Secondary | ICD-10-CM | POA: Diagnosis present

## 2019-08-12 DIAGNOSIS — R6883 Chills (without fever): Secondary | ICD-10-CM | POA: Diagnosis present

## 2019-08-12 DIAGNOSIS — R509 Fever, unspecified: Secondary | ICD-10-CM | POA: Diagnosis not present

## 2019-08-12 DIAGNOSIS — R5383 Other fatigue: Secondary | ICD-10-CM | POA: Diagnosis present
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
## 2019-08-12 NOTE — Discharge Instructions (Addendum)
Push fluids Take tylenol for pain or fever You  must quarantine until your COVID test is back

## 2019-08-12 NOTE — ED Triage Notes (Addendum)
Pt states she has been having chills and headaches. This started last night. Pt states she had a Covid test in the ER last week it was negative.

## 2019-08-15 LAB — NOVEL CORONAVIRUS, NAA (HOSP ORDER, SEND-OUT TO REF LAB; TAT 18-24 HRS): SARS-CoV-2, NAA: NOT DETECTED

## 2019-12-23 IMAGING — CT CT ANGIO NECK
2 of 8 series · 8 of 33 positions shown · IV contrast (APPLIED)
Comparison: Prior CT from earlier the same day and CTA from
03/22/2017

CLINICAL DATA: Initial evaluation for acute left arm weakness,
facial weakness, hemiplegia.

EXAM:
CT ANGIOGRAPHY HEAD AND NECK
TECHNIQUE: Multidetector CT imaging of the head and neck was performed using
the standard protocol during bolus administration of intravenous
contrast. Multiplanar CT image reconstructions and MIPs were
obtained to evaluate the vascular anatomy. Carotid stenosis
measurements (when applicable) are obtained utilizing NASCET
criteria, using the distal internal carotid diameter as the
denominator.
CONTRAST:  75mL N4DOM3-LJX IOPAMIDOL (N4DOM3-LJX) INJECTION 76%

[Series 5: cta neck/head · axial · 0.45mm/px · z∈[-169,-57]mm · 2 of 170 slices shown]
[im 57/170  soft-tissue]
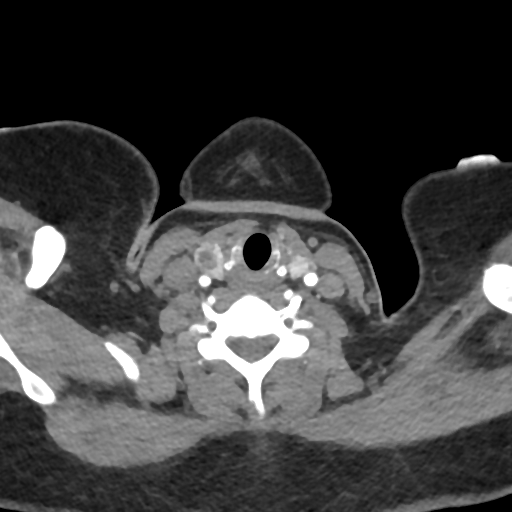
[im 113/170  soft-tissue]
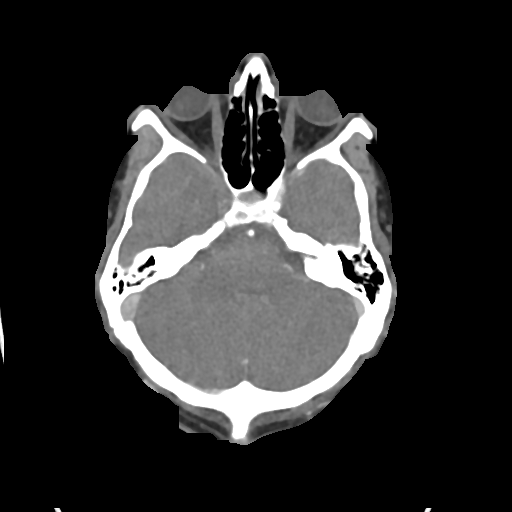

[Series 7: ax thins · axial · 0.39mm/px · z∈[-232,+8]mm · 6 of 338 slices shown]
[im 49/338  soft-tissue]
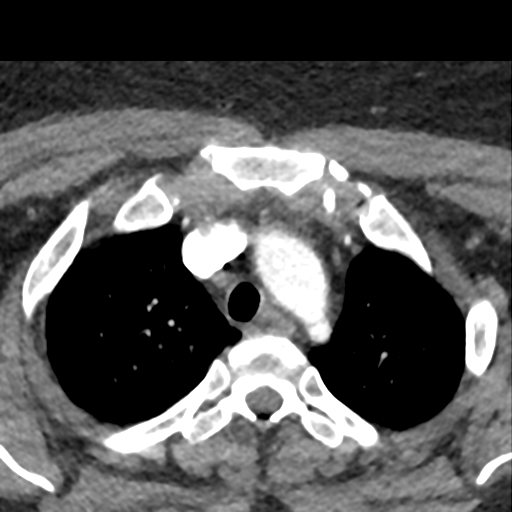
[im 97/338  bone]
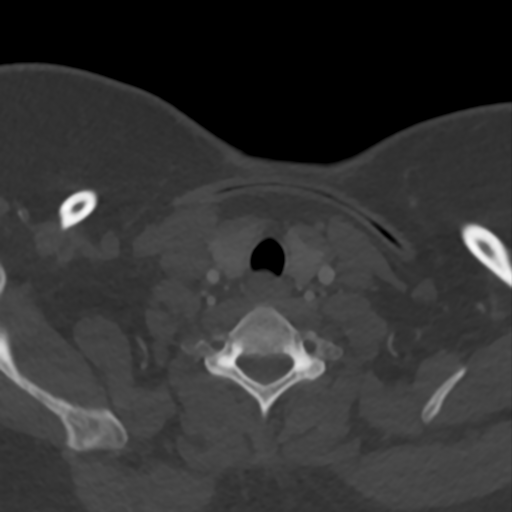
[im 145/338  soft-tissue]
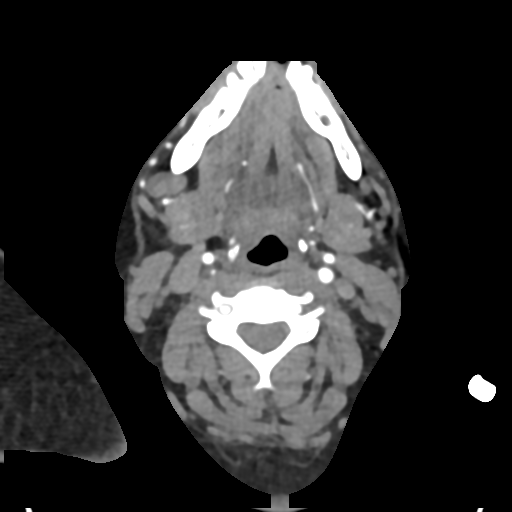
[im 193/338  bone]
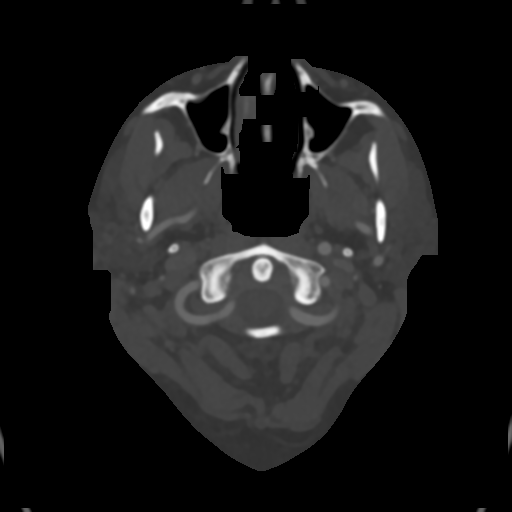
[im 241/338  soft-tissue]
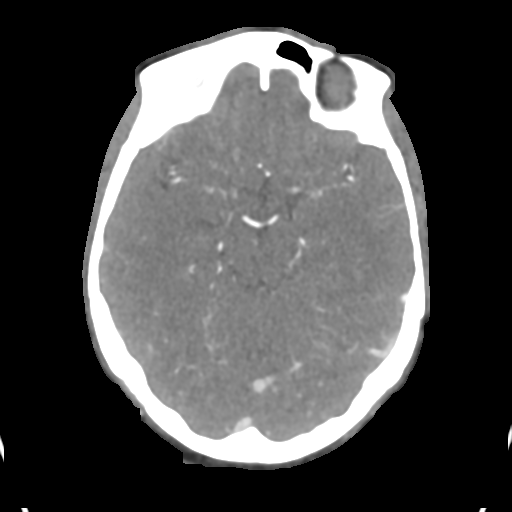
[im 289/338  bone]
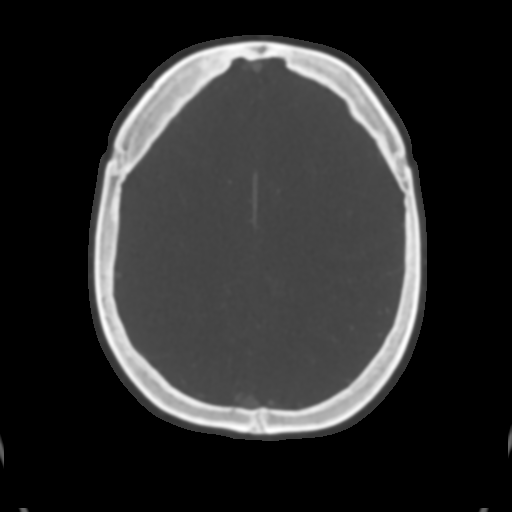

[8 of 33 positions shown; findings below may reference images not displayed]

FINDINGS: CTA NECK FINDINGS

Aortic arch: Visualized aortic arch of normal caliber with normal
branch pattern. No flow-limiting stenosis about the origin of the
great vessels. Visualized subclavian arteries widely patent.

Right carotid system: Right common carotid artery widely patent from
its origin to the bifurcation. Rapid tapering of the proximal right
ICA just distal to its origin which remains markedly narrowed and
attenuated distally within the neck. Right ICA essentially occludes
by the skull base, previously occluding at its cavernous segment on
prior CTA.

Left carotid system: Left common and internal carotid arteries
widely patent without stenosis, dissection, or occlusion. No
atheromatous narrowing about the left bifurcation.

Vertebral arteries: Left vertebral artery arises from the aortic
arch. Right vertebral artery rises from the right subclavian artery.
Vertebral arteries remain widely patent within the neck without
stenosis, dissection or occlusion.

Skeleton: No acute osseous abnormality. No discrete lytic or blastic
osseous lesions.

Other neck: No acute soft tissue abnormality within the neck. Mildly
prominent bilateral level IB nodes measure up to approximately 6 mm
in short axis. 1 cm heterogeneous right thyroid nodule, similar to
previous.

Upper chest: Visualized upper chest demonstrates no acute finding.
Mild scattered atelectatic changes noted within the visualized
lungs.

Review of the MIP images confirms the above findings

CTA HEAD FINDINGS

Anterior circulation: Left ICA remains widely patent to the terminus
without stenosis. Widely patent left A1 segment. Right ICA remains
occluded to the terminus. Distal reconstitution at the right ICA
terminus, similar appearance from previous. Hypoplastic and patent
right A1. Anterior communicating artery widely patent. Anterior
cerebral arteries well perfused and widely patent to their distal
aspects. Right M1 segment somewhat diminutive but remains perfused.
Normal right MCA bifurcation. Distal right MCA branches perfused but
attenuated as compared to the left.. Left M1 segment remains widely
patent. Distal left MCA branches well perfused and stable.

Posterior circulation: Vertebral arteries widely patent to the
vertebrobasilar junction. Left vertebral artery dominant. Patent
left PICA. Right PICA not well seen. Basilar widely patent to its
distal aspect. Superior cerebral arteries patent bilaterally. Both
of the posterior cerebral arteries primarily supplied via the
basilar, although small bilateral posterior communicating arteries
noted, both of which are patent. PCAs patent to their distal aspects
without stenosis.

Venous sinuses: Patent.

Anatomic variants: Anatomic variant at the aortic arch again noted.

Delayed phase: No abnormal enhancement.

Review of the MIP images confirms the above findings
IMPRESSION: 1. Occlusion of the right ICA within the neck, prior to the skull
base. While this finding is similar as compared to previous CTA,
right ICA was previously seen to occluded at its cavernous segment.
Otherwise persistent and relatively stable distal reconstitution at
the right MCA terminus with perfusion of the right MCA and ACA. Left
carotid artery system remains widely patent without significant
atherosclerotic change.
2. Otherwise stable and negative CTA of the head and neck.
3. 1 cm right thyroid nodule, indeterminate. Follow-up examination
with dedicated nonemergent thyroid ultrasound recommended for
further evaluation.

## 2019-12-31 ENCOUNTER — Inpatient Hospital Stay (HOSPITAL_COMMUNITY): Payer: Medicaid Other

## 2019-12-31 ENCOUNTER — Encounter (HOSPITAL_COMMUNITY): Payer: Self-pay | Admitting: Obstetrics & Gynecology

## 2019-12-31 ENCOUNTER — Other Ambulatory Visit: Payer: Self-pay

## 2019-12-31 ENCOUNTER — Inpatient Hospital Stay (HOSPITAL_COMMUNITY)
Admission: AD | Admit: 2019-12-31 | Discharge: 2019-12-31 | Disposition: A | Discharge status: Home / Self Care | Payer: Medicaid Other | Attending: Obstetrics & Gynecology | Admitting: Obstetrics & Gynecology

## 2019-12-31 DIAGNOSIS — R1030 Lower abdominal pain, unspecified: Secondary | ICD-10-CM | POA: Insufficient documentation

## 2019-12-31 DIAGNOSIS — Z6790 Unspecified blood type, Rh positive: Secondary | ICD-10-CM

## 2019-12-31 DIAGNOSIS — O219 Vomiting of pregnancy, unspecified: Secondary | ICD-10-CM

## 2019-12-31 DIAGNOSIS — Z3A01 Less than 8 weeks gestation of pregnancy: Secondary | ICD-10-CM | POA: Diagnosis not present

## 2019-12-31 DIAGNOSIS — R109 Unspecified abdominal pain: Secondary | ICD-10-CM

## 2019-12-31 DIAGNOSIS — O26891 Other specified pregnancy related conditions, first trimester: Secondary | ICD-10-CM

## 2019-12-31 DIAGNOSIS — O210 Mild hyperemesis gravidarum: Secondary | ICD-10-CM | POA: Insufficient documentation

## 2019-12-31 DIAGNOSIS — Z3491 Encounter for supervision of normal pregnancy, unspecified, first trimester: Secondary | ICD-10-CM

## 2019-12-31 DIAGNOSIS — O99891 Other specified diseases and conditions complicating pregnancy: Secondary | ICD-10-CM

## 2019-12-31 LAB — CBC
HCT: 34.5 % — ABNORMAL LOW (ref 36.0–46.0)
Hemoglobin: 11.8 g/dL — ABNORMAL LOW (ref 12.0–15.0)
MCH: 26.9 pg (ref 26.0–34.0)
MCHC: 34.2 g/dL (ref 30.0–36.0)
MCV: 78.6 fL — ABNORMAL LOW (ref 80.0–100.0)
Platelets: 180 10*3/uL (ref 150–400)
RBC: 4.39 MIL/uL (ref 3.87–5.11)
RDW: 17.2 % — ABNORMAL HIGH (ref 11.5–15.5)
WBC: 6.6 10*3/uL (ref 4.0–10.5)
nRBC: 0 % (ref 0.0–0.2)

## 2019-12-31 LAB — URINALYSIS, ROUTINE W REFLEX MICROSCOPIC
Bilirubin Urine: NEGATIVE
Glucose, UA: >=500 mg/dL — AB
Hgb urine dipstick: NEGATIVE
Ketones, ur: NEGATIVE mg/dL
Leukocytes,Ua: NEGATIVE
Nitrite: NEGATIVE
Protein, ur: NEGATIVE mg/dL
Specific Gravity, Urine: 1.017 (ref 1.005–1.030)
pH: 5 (ref 5.0–8.0)

## 2019-12-31 LAB — WET PREP, GENITAL
Clue Cells Wet Prep HPF POC: NONE SEEN
Sperm: NONE SEEN
Trich, Wet Prep: NONE SEEN

## 2019-12-31 LAB — HIV ANTIBODY (ROUTINE TESTING W REFLEX): HIV Screen 4th Generation wRfx: NONREACTIVE

## 2019-12-31 LAB — ABO/RH: ABO/RH(D): O POS

## 2019-12-31 LAB — POCT PREGNANCY, URINE: Preg Test, Ur: POSITIVE — AB

## 2019-12-31 LAB — HCG, QUANTITATIVE, PREGNANCY: hCG, Beta Chain, Quant, S: 11037 m[IU]/mL — ABNORMAL HIGH (ref <5)

## 2019-12-31 MED ORDER — PROMETHAZINE HCL 25 MG PO TABS: 12.5000 mg | ORAL_TABLET | Freq: Every evening | ORAL | 2 refills | Status: DC | PRN

## 2019-12-31 MED ORDER — METOCLOPRAMIDE HCL 10 MG PO TABS: 10.0000 mg | ORAL_TABLET | Freq: Three times a day (TID) | ORAL | 2 refills | Status: DC
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
## 2019-12-31 NOTE — MAU Note (Signed)
.   Joann Meyers is a 34 y.o. at [redacted]w[redacted]d here in MAU reporting: that she had 2 positive tests yesterday and is having nausea and vomiting for a couple of weeks with lower abdominal cramping LMP: 11/23/19 Onset of complaint: few weeks Pain score: 10 Vitals:   12/31/19 1131  BP: 131/77  Pulse: 91  Resp: 16  Temp: 98 F (36.7 C)  SpO2: 100%     FHT: Lab orders placed from triage: UA/UPT

## 2020-01-01 LAB — GC/CHLAMYDIA PROBE AMP (~~LOC~~) NOT AT ARMC
Chlamydia: NEGATIVE
Comment: NEGATIVE
Comment: NORMAL
Neisseria Gonorrhea: NEGATIVE

## 2020-01-06 ENCOUNTER — Encounter (HOSPITAL_COMMUNITY): Payer: Self-pay | Admitting: Family Medicine

## 2020-01-06 ENCOUNTER — Inpatient Hospital Stay (HOSPITAL_COMMUNITY)
Admission: AD | Admit: 2020-01-06 | Discharge: 2020-01-07 | Disposition: A | Discharge status: Home / Self Care | Payer: Medicaid Other | Attending: Family Medicine | Admitting: Family Medicine

## 2020-01-06 DIAGNOSIS — Z3A01 Less than 8 weeks gestation of pregnancy: Secondary | ICD-10-CM

## 2020-01-06 DIAGNOSIS — O4691 Antepartum hemorrhage, unspecified, first trimester: Secondary | ICD-10-CM

## 2020-01-06 DIAGNOSIS — Z8673 Personal history of transient ischemic attack (TIA), and cerebral infarction without residual deficits: Secondary | ICD-10-CM | POA: Insufficient documentation

## 2020-01-06 DIAGNOSIS — R1030 Lower abdominal pain, unspecified: Secondary | ICD-10-CM | POA: Insufficient documentation

## 2020-01-06 DIAGNOSIS — Z885 Allergy status to narcotic agent status: Secondary | ICD-10-CM | POA: Insufficient documentation

## 2020-01-06 DIAGNOSIS — O200 Threatened abortion: Secondary | ICD-10-CM

## 2020-01-06 DIAGNOSIS — O209 Hemorrhage in early pregnancy, unspecified: Secondary | ICD-10-CM | POA: Insufficient documentation
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
## 2020-01-06 NOTE — MAU Note (Signed)
Pt c/o lower abdominal pain that started a few weeks ago. Pt reports she had some vaginal bleeding today only when she wiped. Has not taken anything for pain because "I don't know what to take." Pain is intermittent. She denies urinary s/s.

## 2020-01-07 ENCOUNTER — Inpatient Hospital Stay (HOSPITAL_COMMUNITY): Payer: Medicaid Other

## 2020-01-07 ENCOUNTER — Encounter (HOSPITAL_COMMUNITY): Payer: Self-pay | Admitting: Family Medicine

## 2020-01-07 DIAGNOSIS — Z8673 Personal history of transient ischemic attack (TIA), and cerebral infarction without residual deficits: Secondary | ICD-10-CM | POA: Diagnosis not present

## 2020-01-07 DIAGNOSIS — Z885 Allergy status to narcotic agent status: Secondary | ICD-10-CM | POA: Diagnosis not present

## 2020-01-07 DIAGNOSIS — O209 Hemorrhage in early pregnancy, unspecified: Secondary | ICD-10-CM | POA: Diagnosis not present

## 2020-01-07 DIAGNOSIS — Z3A01 Less than 8 weeks gestation of pregnancy: Secondary | ICD-10-CM | POA: Diagnosis not present

## 2020-01-07 DIAGNOSIS — R1030 Lower abdominal pain, unspecified: Secondary | ICD-10-CM | POA: Diagnosis present

## 2020-01-07 LAB — URINALYSIS, ROUTINE W REFLEX MICROSCOPIC
Bacteria, UA: NONE SEEN
Bilirubin Urine: NEGATIVE
Glucose, UA: >=500 mg/dL — AB
Hgb urine dipstick: NEGATIVE
Ketones, ur: NEGATIVE mg/dL
Leukocytes,Ua: NEGATIVE
Nitrite: NEGATIVE
Protein, ur: NEGATIVE mg/dL
Specific Gravity, Urine: 1.022 (ref 1.005–1.030)
pH: 7 (ref 5.0–8.0)

## 2020-01-07 NOTE — Discharge Instructions (Signed)
Vaginal Bleeding During Pregnancy, Second Trimester ° °A small amount of bleeding (spotting) from the vagina is relatively common during pregnancy. It usually stops on its own. Various things can cause spotting during pregnancy. Sometimes the bleeding is normal and is not a sign of a problem in the pregnancy. However, bleeding can also be a sign of something serious. Be sure to tell your health care provider about any vaginal bleeding right away. °Some possible causes of vaginal bleeding during the second trimester include: °· Infection, inflammation, or growths (polyps) on the cervix. °· A condition in which the placenta partially or completely covers the opening of the cervix inside the uterus (placenta previa). °· The placenta separating from the uterus (placenta abruption). °· Early (preterm) labor. °· The cervix opening and thinning before pregnancy is at term and before labor starts (cervical insufficiency). °· A mass of tissue developing in the uterus due to an egg being fertilized incorrectly (molar pregnancy). °Follow these instructions at home: °Activity °· Follow instructions from your health care provider about limiting your activity. Ask what activities are safe for you. °· If needed, make plans for someone to help with your regular activities. °· Do not exercise or do activities that take a lot of effort unless your health care provider approves. °· Do not lift anything that is heavier than 10 lb (4.5 kg), or the limit that your health care provider tells you, until he or she says that it is safe. °· Do not have sex or orgasms until your health care provider says that this is safe. °Medicines °· Take over-the-counter and prescription medicines only as told by your health care provider. °· Do not take aspirin because it can cause bleeding. °General instructions °· Pay attention to any changes in your symptoms. °· Write down how many pads you use each day, how often you change pads, and how soaked  (saturated) they are. °· Do not use tampons or douche. °· If you pass any tissue from your vagina, save the tissue so you can show it to your health care provider. °· Keep all follow-up visits as told by your health care provider. This is important. °Contact a health care provider if: °· You have vaginal bleeding during any time of your pregnancy. °· You have cramps or labor pains. °· You have a fever that does not get better when you take medicines. °Get help right away if: °· You have severe cramps in your back or abdomen. °· You have contractions. °· You have chills. °· You pass large clots or a large amount of tissue from your vagina. °· Your bleeding increases. °· You feel light-headed or weak, or you faint. °· You are leaking fluid or have a gush of fluid from your vagina. °Summary °· Various things can cause bleeding or spotting in pregnancy. °· Be sure to tell your health care provider about any vaginal bleeding right away. °· Follow instructions from your health care provider about limiting your activity. Ask what activities are safe for you. °This information is not intended to replace advice given to you by your health care provider. Make sure you discuss any questions you have with your health care provider. °Document Revised: 01/21/2019 Document Reviewed: 01/04/2017 °Elsevier Patient Education © 2020 [employer] ° °

## 2020-01-07 NOTE — MAU Note (Signed)
Pt given AVS and RN reviewed it with patient.   Pt also given pregnancy verification letter.

## 2020-01-13 ENCOUNTER — Ambulatory Visit (HOSPITAL_COMMUNITY): Payer: Self-pay

## 2020-01-21 ENCOUNTER — Encounter (HOSPITAL_COMMUNITY): Payer: Self-pay

## 2020-01-21 ENCOUNTER — Ambulatory Visit (HOSPITAL_COMMUNITY)
Admission: RE | Admit: 2020-01-21 | Payer: Self-pay | Source: Ambulatory Visit | Attending: Advanced Practice Midwife | Admitting: Advanced Practice Midwife

## 2020-01-21 ENCOUNTER — Ambulatory Visit (HOSPITAL_COMMUNITY): Payer: Self-pay

## 2020-02-04 ENCOUNTER — Other Ambulatory Visit: Payer: Self-pay

## 2020-02-04 ENCOUNTER — Ambulatory Visit (INDEPENDENT_AMBULATORY_CARE_PROVIDER_SITE_OTHER): Payer: Self-pay | Admitting: *Deleted

## 2020-02-04 DIAGNOSIS — O24319 Unspecified pre-existing diabetes mellitus in pregnancy, unspecified trimester: Secondary | ICD-10-CM | POA: Insufficient documentation

## 2020-02-04 DIAGNOSIS — E041 Nontoxic single thyroid nodule: Secondary | ICD-10-CM

## 2020-02-04 DIAGNOSIS — Z8673 Personal history of transient ischemic attack (TIA), and cerebral infarction without residual deficits: Secondary | ICD-10-CM

## 2020-02-04 DIAGNOSIS — O0990 Supervision of high risk pregnancy, unspecified, unspecified trimester: Secondary | ICD-10-CM | POA: Insufficient documentation

## 2020-02-04 DIAGNOSIS — R569 Unspecified convulsions: Secondary | ICD-10-CM

## 2020-02-04 MED ORDER — PRENATAL 27-0.8 MG PO TABS: 1.0000 | ORAL_TABLET | Freq: Every day | ORAL | 12 refills | Status: DC

## 2020-02-04 NOTE — Patient Instructions (Signed)

## 2020-02-04 NOTE — Progress Notes (Signed)
I connected with  Zilda Ismaili on 02/04/20 at  9:30 AM EDT by telephone and verified that I am speaking with the correct person using two identifiers.   I discussed the limitations, risks, security and privacy concerns of performing an evaluation and management service by telephone and the availability of in person appointments. I also discussed with the patient that there may be a patient responsible charge related to this service. The patient expressed understanding and agreed to proceed.  I explained I am completing her New OB Intake today. We discussed Her EDD and that it is based on  sure LMP . I reviewed her allergies, meds, OB History, Medical /Surgical history, and appropriate screenings. I informed her of [facility] services. She has applied for Medicaid. I sent in RX for PNV per protocol.  I explained I will send her the [employer] app and app was sent to her while on phone. She did not receive it while we were online. She states she doesn't have wifi and I asked her to check later today and then may call us if she doesn't get it.   I explained we will ask her to take her blood pressure weekly during her pregnancy. She states she already has a blood pressure cuff and knows how to use it because when she had the stroke they had her get one and showed her how to use it.  Explained she can go ahead and start taking her blood pressure weekly and enter into the app. I explained she will have some visits in office and some virtually. She already has MyChart but forgot her log in information. I assisted her with username and resetting password ; but she still could not log on. I gave her the MyChart help desk number to call for assistance. I reviewed her new ob  appointment date/ time with her , our location and to wear mask, no visitors. She asked for her appointment to be changed she states she can't come on Monday or Turesday's. I had registrar change appointment to first avaibleable with MD and gave her the  appointment.  I explained she will have a pelvic exam, ob bloodwork, hemoglobin a1C, cbg ,pap, and  genetic testing if desired,- she does want a panorama. I scheduled an [place] at 19 weeks and gave her the appointment. She voices understanding.   Rachael Zapanta,RN 02/04/2020  9:34 AM

## 2020-02-05 NOTE — Progress Notes (Signed)
Chart reviewed - agree with CMA/RN documentation.  ° °

## 2020-02-09 ENCOUNTER — Encounter: Payer: Self-pay | Admitting: Family Medicine

## 2020-02-19 ENCOUNTER — Other Ambulatory Visit: Payer: Self-pay | Admitting: Advanced Practice Midwife

## 2020-02-19 DIAGNOSIS — Z8673 Personal history of transient ischemic attack (TIA), and cerebral infarction without residual deficits: Secondary | ICD-10-CM

## 2020-02-19 DIAGNOSIS — I7771 Dissection of carotid artery: Secondary | ICD-10-CM

## 2020-03-03 ENCOUNTER — Other Ambulatory Visit (HOSPITAL_COMMUNITY)
Admission: RE | Admit: 2020-03-03 | Discharge: 2020-03-03 | Disposition: A | Discharge status: Home / Self Care | Payer: Medicaid Other | Source: Ambulatory Visit | Attending: Family Medicine | Admitting: Family Medicine

## 2020-03-03 ENCOUNTER — Other Ambulatory Visit: Payer: Self-pay

## 2020-03-03 ENCOUNTER — Ambulatory Visit (INDEPENDENT_AMBULATORY_CARE_PROVIDER_SITE_OTHER): Payer: Medicaid Other | Admitting: Obstetrics & Gynecology

## 2020-03-03 VITALS — BP 123/79 | HR 88 | Wt 182.3 lb

## 2020-03-03 DIAGNOSIS — Z3A13 13 weeks gestation of pregnancy: Secondary | ICD-10-CM

## 2020-03-03 DIAGNOSIS — O0990 Supervision of high risk pregnancy, unspecified, unspecified trimester: Secondary | ICD-10-CM | POA: Diagnosis not present

## 2020-03-03 DIAGNOSIS — O99211 Obesity complicating pregnancy, first trimester: Secondary | ICD-10-CM

## 2020-03-03 DIAGNOSIS — E669 Obesity, unspecified: Secondary | ICD-10-CM

## 2020-03-03 DIAGNOSIS — E119 Type 2 diabetes mellitus without complications: Secondary | ICD-10-CM

## 2020-03-03 DIAGNOSIS — O24319 Unspecified pre-existing diabetes mellitus in pregnancy, unspecified trimester: Secondary | ICD-10-CM

## 2020-03-03 DIAGNOSIS — Z8673 Personal history of transient ischemic attack (TIA), and cerebral infarction without residual deficits: Secondary | ICD-10-CM

## 2020-03-03 DIAGNOSIS — O24311 Unspecified pre-existing diabetes mellitus in pregnancy, first trimester: Secondary | ICD-10-CM

## 2020-03-03 MED ORDER — ASPIRIN EC 81 MG PO TBEC: 81.0000 mg | DELAYED_RELEASE_TABLET | Freq: Every day | ORAL | 2 refills | Status: DC

## 2020-03-03 MED ORDER — PROMETHAZINE HCL 25 MG PO TABS: 25.0000 mg | ORAL_TABLET | Freq: Four times a day (QID) | ORAL | 2 refills | Status: DC | PRN

## 2020-03-03 MED ORDER — BLOOD PRESSURE KIT DEVI: 1.0000 | Freq: Every day | 0 refills | Status: DC

## 2020-03-03 NOTE — Patient Instructions (Addendum)
AREA PEDIATRIC/FAMILY PRACTICE PHYSICIANS  [place] ([address]) . [facility] o Chambliss, MD; Eniola, MD; Hale, MD; Hensel, MD; McDiarmid, MD; McIntyer, MD; Neal, MD; Walden, MD o [address] [place], [place] [address] o [phone] o Mon-Fri 8:30-12:30, 1:30-5:00 o Providers come to see babies at [facility] o Accepting Medicaid . [facility] o Limited providers who accept newborns: Koirala, MD; Morrow, MD; Wolters, MD o [address], [place], [place] [address] o [phone] o Mon-Fri 8:00-5:30 o Babies seen by providers at [facility] o Does NOT accept Medicaid o Please call early in hospitalization for appointment (limited availability)  . [facility] o Mulberry, MD o [address] [place], [place] [address] o [phone] o Mon, Tue, Thur, Fri 8:30-5:00, Wed 10:00-7:00 (closed 1-2pm) o Babies seen by [facility] providers o Accepting Medicaid . [facility] - Pediatrician o Rubin, MD o [address], [place], [place] [address] o [phone] o Mon-Fri 8:30-5:00, Sat 8:30-12:00 o Provider comes to see babies at [facility] o Accepting Medicaid o Must have been referred from current patients or contacted office prior to delivery . [facility] o Brown, MD; Chandler, MD; Ettefagh, MD; Grant, MD; Lester, MD; McCormick, MD; McQueen, MD; Prose, MD; Simha, MD; Stanley, MD; Stryffeler, NP; Tebben, NP o [address], [place], [place] [address] o [phone] o Mon, Tue, Thur, Fri 8:30-5:30, Wed 9:30-5:30, Sat 8:30-12:30 o Babies seen by [facility] providers o Accepting Medicaid o Only accepting infants of first-time parents or siblings of current patients o Hospital discharge coordinator will make follow-up appointment . Jack Amos o [address],  [place], [place]  [address] o [phone]   Fax - [phone] . [facility] o [address], [place], [place]  [address] o Phone - [phone]   Fax - [phone] . Shilpa Gosrani o [address], [place], [place]  [address] o [phone]  [place]ortheast [place] ([address]) . [facility] o Bates, MD; Brassfield, MD; Cooper, Cox, MD; MD; Davis, MD; Dovico, MD; Ettefaugh, MD; Little, MD; Lowe, MD; Keiffer, MD; Melvin, MD; Sumner, MD; Williams, MD o [address], [place], [place] [address] o [phone] o Mon-Fri 8:30-5:00 (extended evenings Mon-Thur as needed), Sat-Sun 10:00-1:00 o Providers come to see babies at [facility] o Accepting Medicaid for families of first-time babies and families with all children in the household age 3 and under. Must register with office prior to making appointment (M-F only). . [facility] o Henson, NP; Knapp, MD; Lalonde, MD; Tysinger, PA o [address] [place], [place] [address] o [phone] o Mon-Fri 8:00-5:00 o Babies seen by providers at [facility] o Does NOT accept Medicaid/Commercial Insurance Only . [facility] ([facility])  o Artis, MD; Barnes, MD; Bratton, MD; Coccaro, MD; Lockett Gardner, MD; Kramer, MD; Marshall, MD; Netherton, MD; Poleto, MD; Skinner, MD o [address] [place], [place] [address] o [phone] o Mon-Fri 8:30-5:30, Sat (Oct.-Mar.) 9:00-1:00 o Babies seen by providers at [facility] o Accepting Medicaid  [place] ([address]) . [facility] o Reid, MD; Warner, MD o [address], , [place] [address] o [phone] o Mon-Fri 8:30-5:00, Sat 8:30-12:00 o Providers come to see babies at [facility] o Does NOT accept Medicaid . [facility] o Becker, PA; Hagler, MD; Scifres, PA; Sun, MD; Swayne, MD o [address],  [place], [place] [address] o [phone] o Mon-Fri 8:00-5:00 o Babies seen by providers at [facility] o Does NOT accept Medicaid o Only accepting babies of parents who are patients o Please call early in hospitalization for appointment (limited availability) . [facility] o Clark, MD; Frye, MD; Kelleher, MD; Mack, NP; Miller, MD; O'Keller, MD; Patterson, NP; Pudlo, MD; Puzio, MD; Thomas, MD; Tucker, MD; Twiselton, MD o [address], [place], [place] [address] o [phone] o Mon-Fri 8:00-5:00, Sat 9:00-12:00 o Providers come to see babies at [facility] o Does NOT accept Medicaid  [place] ([address]) . [facility] o Limited providers accepting new patients: Brake, NP; Wharton, PA o [address], [place], [place] [address] o [phone] o Mon-Fri 8:00-5:00 o Babies seen by providers at [facility] o Does NOT accept Medicaid o Only accepting babies of parents who are patients o Please call early in hospitalization for appointment (limited availability) . [facility] o Gay, MD; Quinlan, MD o [address] [place], [place] [address] o [phone] (press 1 to schedule appointment) o Mon-Fri 8:00-5:00 o Providers come to see babies at [facility] o Does NOT accept Medicaid . [facility] o Mazer, MD o [address] [place], [place] [address] o [phone] o Mon-Fri 8:30-5:00 (lunch 12:30-1:00), extended hours by appointment only Wed 5:00-6:30 o Babies seen by [facility] providers o Accepting Medicaid . [facility] o Banks, MD; Jordan, MD; Koberlein, MD o [address], [place], [place] [address] o [phone] o Mon-Fri 8:00-5:00 o Babies seen by [facility] providers o Does NOT accept Medicaid . [facility] o Parker, MD; Hunter, MD; Wallace, DO o [address] [place], [place]  [address] o [phone] o Mon-Fri 8:00-5:00 o Babies seen by [facility] providers o Does NOT accept Medicaid . [facility] o Brandon, PA; Brecken, PA; Christy, NP; Dees, MD; DeClaire, MD; DeWeese, MD; Hansen, NP; Mills, NP; Parrish, NP; Smoot, NP; Summer, MD; Vapne, MD o [address] [place], [place] [address] o [phone] o Mon-Fri 8:30-5:00, Sat 10:00-1:00 o Providers come to see babies at [facility] o Does NOT accept Medicaid o Free prenatal information session Tuesdays at 4:45pm . [facility] o Bouska, MD; Gordon, PA; Jeffery, PA; Weber, PA o [address] [place] [address] o [phone] o Mon-Fri 7:30-5:30 o Babies seen by [facility] providers . [facility] o [address], [place], [place]  [address] o [phone]   Fax - [phone]  [place] ([address] & [address]) . [facility] o Reese, MD o [address] [place], [place] [address] o [phone] o Mon-Thur 8:00-6:00 o Providers come to see babies at [facility] o Accepting Medicaid . [facility] o Anderson, NP; Badger, MD; Beal, PA; Spencer, PA o [address] [place], [place] [address] o [phone] o Mon-Thur 7:30-7:30, Fri 7:30-4:30 o Babies seen by [facility] providers o Accepting Medicaid . [facility] o Agbuya, MD; Klett, NP; Romgoolam, MD o [address] [place] [address], [place],  [address] o [phone] o Mon-Fri 8:30-5:00, Sat 8:30-12:00 o Providers come to see babies at [facility] o Accepting Medicaid o Must have "Meet & Greet" appointment at office prior to delivery . [facility] o McCord,   MD; Juleen China, MD; Clydene Laming, [place] [address], [place], [place] [address] o [phone] o Mon-Wed 8:00-6:00, Thur-Fri 8:00-5:00, Sat 9:00-12:00 o Providers come to  see babies at [facility] o Does NOT accept Medicaid o Only accepting siblings of current patients . [facility] [place], [place], [place], [place]  [address] o [phone]   Fax [phone] . [facility] [place] [address], [place], [place]  [address] o [phone]   Fax - Morton Gorman [phone] & [phone]) . Therapist, music at [place], DO; [place], [address] [place], [place] [address] o [phone] o Mon-Fri 7:00-5:00 o Babies seen by [facility] providers o Does NOT accept Medicaid . [place], MD; [place], [place]; [place], [place], [place], [place] [address] o [phone] o Mon-Fri 8:00-5:00 o Babies seen by [facility] providers o Accepting Medicaid . Lamont, MD; [place], [place]; [place], NP; [place], [place], [place], [place] [address] o [phone] o Mon-Fri 8:00-5:00 o Babies seen by providers at [facility] [place] [phone]) . [facility], [place] o [address] [place], [place] [address] o [phone] o Mon-Fri 8:00-5:00 o Babies seen by [facility] providers o Does NOT accept Medicaid o Limited availability, please call early in hospitalization to schedule follow-up . [facility] Leilani Merl, PA; Maisie Fus, MD; [place], MD; [place], [place]; Jeannine Kitten, MD; [place], [place] [facility] [address], [place], [place] [address] o [phone] o Mon-Fri 8:30-5:00, Sat 9:00-12:00 o Babies seen by providers at [facility] o Accepting Medicaid o Please register online then schedule online or call office o [email or web address] . [place] [facility]  [place]) Kristian Covey, NP; Dwyane Dee, MD; Leonidas Romberg, PA o [address] [place], [place], [place] [address] o [phone] o Mon-Fri 8:00-5:00 o Babies seen by providers at [facility] o Accepting Medicaid . [place] ([facility]) Dairl Ponder, MD; Rayvon Char, NP; Melina Modena, MD o [address] [place], [place], [place] [address] o [phone] o Mon-Fri 8:00-5:30, Sat&Sun by appointment (phones open at 8:30) o Babies seen by [facility] providers o Accepting Medicaid o Must be a first-time baby or sibling of current patient . [place], [address], [place], [place]  [address] o [phone]   Fax - [phone]  [place] [phone] & [phone]) . [place], [place]; [place], [place]; Benjamine Mola, MD; [place], [place]; Harrell Lark, MD o [address] [place], [place] [address] o [phone] o Mon-Thur 8:00-7:00, Fri 8:00-5:00, Sat 8:00-12:00, Sun 9:00-12:00 o Babies seen by [facility] providers o Accepting Medicaid . [facility], MD; Ruthann Cancer, MD; [facility], MD o [address], [place], [place] [address] o [phone] o Mon-Thur 8:00-5:00 o Babies seen by providers at [facility] o Accepting Medicaid . [facility] [place], MD; Co[place], MD; Amedeo Plenty, MD; Bobby Rumpf, MD; List, MD; Lavonia Drafts, MD; Ruthann Cancer, MD; Selinda Eon, MD; Audie Box, MD; Jim Like, MD; Christie Nottingham, MD; Hubbard [place], MD; Modena Nunnery, MD o [address] [place], [place]  [address] o [phone] o Mon-Fri 8:00-5:30, Sat (Oct.-Mar.) 9:00-1:00 o Babies seen by providers at [facility] o Accepting Medicaid o Must fill out new patient packet, available online at [email or web address] . [place] ([facility]) Barnabas Lister, NP; Kenton Kingfisher, NP; Claiborne Billings, NP; Rolla Plate, MD;  [place], [place]; Carola Rhine, MD; Tyron Russell, MD; Delia Chimes, NP o [address], [place], [place] [address] o [phone] o Mon-Thur 8:00-5:30, Fri 8:00-5:00 o Babies seen by providers at [place] [phone]) . [place], [place]; Bledsoe, MD; Dennard Schaumann, MD; [place], [place] o [address] [place], [place] [address] o [phone] o Mon-Fri 8:00-5:00 o Babies seen by providers at [place] [phone]) . [place] at [place], [place]; Olen Pel, MD; [place], [place], [place], [place] [address] o [phone] o Mon-Fri 8:00-5:00 o Babies seen by providers at [facility] o Does NOT accept Medicaid o Limited appointment availability, please call early in hospitalization  . Therapist, music at [place], [place]; [place], [address] [place], [place] [address] o [phone] o Mon-Fri 8:00-5:00 o Babies seen by [facility] providers o Does NOT accept Medicaid . [facility], MD; Guy Sandifer, MD; [place], [place]; [place], [place], [place], [place] [address] o [phone] o Mon-Fri 8:00-5:00 o After hours clinic [facility] [place], [place] [address]) [phone] Mon-Fri 5:00-8:00, Sat 12:00-6:00, Sun 10:00-4:00 o Babies seen by [facility] providers o Accepting Medicaid . [facility] o [address], [place], [place]  [address] o [phone]   Fax - [phone]  [place] [phone]) . Therapist, music at [facility], MD o [address] [place], [place], [place] [address] o [phone] o Mon-Fri 8:00-5:00 o Babies seen by [facility] providers o Does NOT accept Medicaid . [place] [facility]) Bing Neighbors, MD o [address] [place], [place], [place]  [address] o [phone] o Mon-Thur 8:00-7:00, Fri 8:00-5:00, Sat 8:00-12:00 o Babies seen by providers at [facility] o Accepting Medicaid - but does not have vaccinations in office (must be received elsewhere) o Limited availability, please call early in hospitalization  [place] ([address]) . [place], [place], [place] [address] o [phone]  Fax [phone]    PICK UP BP CUFF @ [facility]!!!     Second Trimester of Pregnancy  The second trimester is from week 14 through week 27 (month 4 through 6). This is often the time in pregnancy that you feel your best. Often times, morning sickness has lessened or quit. You may have more energy, and you may get hungry more often. Your unborn baby is growing rapidly. At the end of the sixth month, he or she is about 9 inches long and weighs about 1 pounds. You will likely feel the baby move between 18 and 20 weeks of pregnancy. Follow these instructions at home: Medicines  Take over-the-counter and prescription medicines only as told by your doctor. Some medicines are safe and some medicines are not safe during pregnancy.  Take a prenatal vitamin that contains at least 600 micrograms (mcg) of folic acid.  If you have trouble  pooping (constipation), take medicine that will make your stool soft (stool softener) if your doctor approves. Eating and drinking   Eat regular, healthy meals.  Avoid raw meat and uncooked cheese.  If you get low calcium from the food you eat, talk to your doctor about taking a daily calcium supplement.  Avoid foods that are high in fat and sugars, such as fried and sweet foods.  If you feel sick to your stomach (nauseous) or throw up (vomit): ? Eat 4 or 5 small meals a day instead of 3 large meals. ? Try eating a few soda crackers. ? Drink liquids between meals instead of during meals.  To  prevent constipation: ? Eat foods that are high in fiber, like fresh fruits and vegetables, whole grains, and beans. ? Drink enough fluids to keep your pee (urine) clear or pale yellow. Activity  Exercise only as told by your doctor. Stop exercising if you start to have cramps.  Do not exercise if it is too hot, too humid, or if you are in a place of great height (high altitude).  Avoid heavy lifting.  Wear low-heeled shoes. Sit and stand up straight.  You can continue to have sex unless your doctor tells you not to. Relieving pain and discomfort  Wear a good support bra if your breasts are tender.  Take warm water baths (sitz baths) to soothe pain or discomfort caused by hemorrhoids. Use hemorrhoid cream if your doctor approves.  Rest with your legs raised if you have leg cramps or low back pain.  If you develop puffy, bulging veins (varicose veins) in your legs: ? Wear support hose or compression stockings as told by your doctor. ? Raise (elevate) your feet for 15 minutes, 3-4 times a day. ? Limit salt in your food. Prenatal care  Write down your questions. Take them to your prenatal visits.  Keep all your prenatal visits as told by your doctor. This is important. Safety  Wear your seat belt when driving.  Make a list of emergency phone numbers, including numbers for family, friends, the hospital, and police and fire departments. General instructions  Ask your doctor about the right foods to eat or for help finding a counselor, if you need these services.  Ask your doctor about local prenatal classes. Begin classes before month 6 of your pregnancy.  Do not use hot tubs, steam rooms, or saunas.  Do not douche or use tampons or scented sanitary pads.  Do not cross your legs for long periods of time.  Visit your dentist if you have not done so. Use a soft toothbrush to brush your teeth. Floss gently.  Avoid all smoking, herbs, and alcohol. Avoid drugs that are not  approved by your doctor.  Do not use any products that contain nicotine or tobacco, such as cigarettes and e-cigarettes. If you need help quitting, ask your doctor.  Avoid cat litter boxes and soil used by cats. These carry germs that can cause birth defects in the baby and can cause a loss of your baby (miscarriage) or stillbirth. Contact a doctor if:  You have mild cramps or pressure in your lower belly.  You have pain when you pee (urinate).  You have bad smelling fluid coming from your vagina.  You continue to feel sick to your stomach (nauseous), throw up (vomit), or have watery poop (diarrhea).  You have a nagging pain in your belly area.  You feel dizzy. Get help right away if:  You have  a fever.  You are leaking fluid from your vagina.  You have spotting or bleeding from your vagina.  You have severe belly cramping or pain.  You lose or gain weight rapidly.  You have trouble catching your breath and have chest pain.  You notice sudden or extreme puffiness (swelling) of your face, hands, ankles, feet, or legs.  You have not felt the baby move in over an hour.  You have severe headaches that do not go away when you take medicine.  You have trouble seeing. Summary  The second trimester is from week 14 through week 27 (months 4 through 6). This is often the time in pregnancy that you feel your best.  To take care of yourself and your unborn baby, you will need to eat healthy meals, take medicines only if your doctor tells you to do so, and do activities that are safe for you and your baby.  Call your doctor if you get sick or if you notice anything unusual about your pregnancy. Also, call your doctor if you need help with the right food to eat, or if you want to know what activities are safe for you. This information is not intended to replace advice given to you by your health care provider. Make sure you discuss any questions you have with your health care  provider. Document Revised: 01/24/2019 Document Reviewed: 11/07/2016 Elsevier Patient Education  [place].
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
## 2020-03-04 LAB — OBSTETRIC PANEL, INCLUDING HIV
Antibody Screen: NEGATIVE
Basophils Absolute: 0 10*3/uL (ref 0.0–0.2)
Basos: 0 %
EOS (ABSOLUTE): 0.1 10*3/uL (ref 0.0–0.4)
Eos: 2 %
HIV Screen 4th Generation wRfx: NONREACTIVE
Hematocrit: 31 % — ABNORMAL LOW (ref 34.0–46.6)
Hemoglobin: 10.1 g/dL — ABNORMAL LOW (ref 11.1–15.9)
Hepatitis B Surface Ag: NEGATIVE
Immature Grans (Abs): 0 10*3/uL (ref 0.0–0.1)
Immature Granulocytes: 0 %
Lymphocytes Absolute: 0.8 10*3/uL (ref 0.7–3.1)
Lymphs: 13 %
MCH: 28.1 pg (ref 26.6–33.0)
MCHC: 32.6 g/dL (ref 31.5–35.7)
MCV: 86 fL (ref 79–97)
Monocytes Absolute: 0.4 10*3/uL (ref 0.1–0.9)
Monocytes: 6 %
Neutrophils Absolute: 4.8 10*3/uL (ref 1.4–7.0)
Neutrophils: 79 %
Platelets: 132 10*3/uL — ABNORMAL LOW (ref 150–450)
RBC: 3.59 x10E6/uL — ABNORMAL LOW (ref 3.77–5.28)
RDW: 15.5 % — ABNORMAL HIGH (ref 11.7–15.4)
RPR Ser Ql: NONREACTIVE
Rh Factor: POSITIVE
Rubella Antibodies, IGG: <0.9 index — ABNORMAL LOW (ref >0.99)
WBC: 6.2 10*3/uL (ref 3.4–10.8)

## 2020-03-04 LAB — HEMOGLOBIN A1C
Est. average glucose Bld gHb Est-mCnc: 154 mg/dL
Hgb A1c MFr Bld: 7 % — ABNORMAL HIGH (ref 4.8–5.6)

## 2020-03-04 LAB — HEPATITIS C ANTIBODY: Hep C Virus Ab: <0.1 s/co ratio (ref 0.0–0.9)

## 2020-03-05 ENCOUNTER — Encounter: Payer: Self-pay | Admitting: *Deleted

## 2020-03-05 LAB — CYTOLOGY - PAP
Chlamydia: NEGATIVE
Comment: NEGATIVE
Comment: NEGATIVE
Comment: NORMAL
Diagnosis: NEGATIVE
High risk HPV: NEGATIVE
Neisseria Gonorrhea: NEGATIVE

## 2020-03-05 LAB — URINE CULTURE, OB REFLEX

## 2020-03-05 LAB — CULTURE, OB URINE

## 2020-03-17 ENCOUNTER — Telehealth: Payer: Self-pay | Admitting: *Deleted

## 2020-03-17 ENCOUNTER — Encounter: Payer: Self-pay | Admitting: *Deleted

## 2020-03-17 DIAGNOSIS — O24319 Unspecified pre-existing diabetes mellitus in pregnancy, unspecified trimester: Secondary | ICD-10-CM

## 2020-03-17 DIAGNOSIS — E119 Type 2 diabetes mellitus without complications: Secondary | ICD-10-CM

## 2020-03-17 DIAGNOSIS — Z8673 Personal history of transient ischemic attack (TIA), and cerebral infarction without residual deficits: Secondary | ICD-10-CM

## 2020-03-17 DIAGNOSIS — O0990 Supervision of high risk pregnancy, unspecified, unspecified trimester: Secondary | ICD-10-CM

## 2020-03-17 NOTE — Telephone Encounter (Signed)
We received her panorama results which indicated high risk due to fetal DNA fraction.  Reviewed with Dr. Rosana Hoes and ordered repeat genetic screen ( panorama). I called Joann Meyers and explained results and recommendations. She agreed to come in 6/3 for a redraw.   Eryn Marandola,RN

## 2020-03-18 ENCOUNTER — Other Ambulatory Visit: Payer: Medicaid Other

## 2020-03-18 ENCOUNTER — Other Ambulatory Visit: Payer: Self-pay

## 2020-03-18 ENCOUNTER — Encounter: Payer: Self-pay | Admitting: *Deleted

## 2020-03-18 DIAGNOSIS — Z8673 Personal history of transient ischemic attack (TIA), and cerebral infarction without residual deficits: Secondary | ICD-10-CM

## 2020-03-18 DIAGNOSIS — O24319 Unspecified pre-existing diabetes mellitus in pregnancy, unspecified trimester: Secondary | ICD-10-CM

## 2020-03-18 DIAGNOSIS — E119 Type 2 diabetes mellitus without complications: Secondary | ICD-10-CM

## 2020-03-18 DIAGNOSIS — O0990 Supervision of high risk pregnancy, unspecified, unspecified trimester: Secondary | ICD-10-CM

## 2020-03-22 ENCOUNTER — Encounter (HOSPITAL_COMMUNITY): Payer: Self-pay | Admitting: Emergency Medicine

## 2020-03-22 ENCOUNTER — Telehealth: Payer: Self-pay

## 2020-03-22 ENCOUNTER — Ambulatory Visit (HOSPITAL_COMMUNITY)
Admission: EM | Admit: 2020-03-22 | Discharge: 2020-03-22 | Disposition: A | Discharge status: Home / Self Care | Payer: Medicaid Other | Attending: Family Medicine | Admitting: Family Medicine

## 2020-03-22 ENCOUNTER — Other Ambulatory Visit: Payer: Self-pay

## 2020-03-22 DIAGNOSIS — O285 Abnormal chromosomal and genetic finding on antenatal screening of mother: Secondary | ICD-10-CM

## 2020-03-22 DIAGNOSIS — R079 Chest pain, unspecified: Secondary | ICD-10-CM

## 2020-03-22 NOTE — Telephone Encounter (Signed)
-----   Message from Woodroe Mode, MD sent at 03/18/2020  4:21 PM EDT ----- High risk for Tri 18, needs genetic counseling

## 2020-03-22 NOTE — ED Triage Notes (Signed)
Pt sts mid sternal CP worse with cough, inspiration and movement starting today

## 2020-03-22 NOTE — Discharge Instructions (Signed)
Your lungs are clear today and your EKG was normal.  This is most likely musculoskeletal chest pain.  You can try doing Tylenol for your pain Rest and for any continued or worsening symptoms please go to the ER. Follow-up with your OB/GYN on Wednesday as planned

## 2020-03-22 NOTE — Telephone Encounter (Signed)
Spoke with Ms. 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 Advised pt to contact [facility] for genetic counseling at [phone]. Pt verbalized understanding.
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
## 2020-03-24 ENCOUNTER — Telehealth (INDEPENDENT_AMBULATORY_CARE_PROVIDER_SITE_OTHER): Payer: Medicaid Other | Admitting: Obstetrics and Gynecology

## 2020-03-24 ENCOUNTER — Encounter (HOSPITAL_COMMUNITY): Payer: Self-pay | Admitting: Obstetrics and Gynecology

## 2020-03-24 ENCOUNTER — Emergency Department (HOSPITAL_COMMUNITY)
Admission: AD | Admit: 2020-03-24 | Discharge: 2020-03-25 | Disposition: A | Discharge status: Home / Self Care | Payer: Medicaid Other | Attending: Emergency Medicine | Admitting: Emergency Medicine

## 2020-03-24 ENCOUNTER — Other Ambulatory Visit: Payer: Self-pay

## 2020-03-24 ENCOUNTER — Encounter: Payer: Medicaid Other | Attending: Obstetrics & Gynecology | Admitting: Dietician

## 2020-03-24 ENCOUNTER — Encounter: Payer: Self-pay | Admitting: Dietician

## 2020-03-24 VITALS — BP 161/86 | HR 88

## 2020-03-24 DIAGNOSIS — Z91199 Patient's noncompliance with other medical treatment and regimen due to unspecified reason: Secondary | ICD-10-CM

## 2020-03-24 DIAGNOSIS — O0990 Supervision of high risk pregnancy, unspecified, unspecified trimester: Secondary | ICD-10-CM

## 2020-03-24 DIAGNOSIS — O26892 Other specified pregnancy related conditions, second trimester: Secondary | ICD-10-CM | POA: Insufficient documentation

## 2020-03-24 DIAGNOSIS — O10919 Unspecified pre-existing hypertension complicating pregnancy, unspecified trimester: Secondary | ICD-10-CM | POA: Insufficient documentation

## 2020-03-24 DIAGNOSIS — R102 Pelvic and perineal pain: Secondary | ICD-10-CM | POA: Diagnosis not present

## 2020-03-24 DIAGNOSIS — E1165 Type 2 diabetes mellitus with hyperglycemia: Secondary | ICD-10-CM | POA: Insufficient documentation

## 2020-03-24 DIAGNOSIS — E119 Type 2 diabetes mellitus without complications: Secondary | ICD-10-CM | POA: Diagnosis present

## 2020-03-24 DIAGNOSIS — R0789 Other chest pain: Secondary | ICD-10-CM | POA: Insufficient documentation

## 2020-03-24 DIAGNOSIS — O24319 Unspecified pre-existing diabetes mellitus in pregnancy, unspecified trimester: Secondary | ICD-10-CM

## 2020-03-24 DIAGNOSIS — Z3A16 16 weeks gestation of pregnancy: Secondary | ICD-10-CM | POA: Insufficient documentation

## 2020-03-24 DIAGNOSIS — O24312 Unspecified pre-existing diabetes mellitus in pregnancy, second trimester: Secondary | ICD-10-CM

## 2020-03-24 DIAGNOSIS — O0992 Supervision of high risk pregnancy, unspecified, second trimester: Secondary | ICD-10-CM

## 2020-03-24 DIAGNOSIS — R739 Hyperglycemia, unspecified: Secondary | ICD-10-CM

## 2020-03-24 DIAGNOSIS — Z9119 Patient's noncompliance with other medical treatment and regimen: Secondary | ICD-10-CM

## 2020-03-24 DIAGNOSIS — R112 Nausea with vomiting, unspecified: Secondary | ICD-10-CM | POA: Diagnosis not present

## 2020-03-24 DIAGNOSIS — O24112 Pre-existing diabetes mellitus, type 2, in pregnancy, second trimester: Secondary | ICD-10-CM | POA: Diagnosis not present

## 2020-03-24 DIAGNOSIS — R519 Headache, unspecified: Secondary | ICD-10-CM | POA: Diagnosis not present

## 2020-03-24 DIAGNOSIS — E669 Obesity, unspecified: Secondary | ICD-10-CM

## 2020-03-24 DIAGNOSIS — O99891 Other specified diseases and conditions complicating pregnancy: Secondary | ICD-10-CM

## 2020-03-24 DIAGNOSIS — Z7984 Long term (current) use of oral hypoglycemic drugs: Secondary | ICD-10-CM | POA: Insufficient documentation

## 2020-03-24 DIAGNOSIS — O169 Unspecified maternal hypertension, unspecified trimester: Secondary | ICD-10-CM

## 2020-03-24 DIAGNOSIS — Z8673 Personal history of transient ischemic attack (TIA), and cerebral infarction without residual deficits: Secondary | ICD-10-CM

## 2020-03-24 DIAGNOSIS — O99210 Obesity complicating pregnancy, unspecified trimester: Secondary | ICD-10-CM | POA: Insufficient documentation

## 2020-03-24 DIAGNOSIS — I7771 Dissection of carotid artery: Secondary | ICD-10-CM

## 2020-03-24 LAB — COMPREHENSIVE METABOLIC PANEL
ALT: 14 U/L (ref 0–44)
AST: 16 U/L (ref 15–41)
Albumin: 3 g/dL — ABNORMAL LOW (ref 3.5–5.0)
Alkaline Phosphatase: 39 U/L (ref 38–126)
Anion gap: 8 (ref 5–15)
BUN: 8 mg/dL (ref 6–20)
CO2: 21 mmol/L — ABNORMAL LOW (ref 22–32)
Calcium: 8.6 mg/dL — ABNORMAL LOW (ref 8.9–10.3)
Chloride: 107 mmol/L (ref 98–111)
Creatinine, Ser: 0.43 mg/dL — ABNORMAL LOW (ref 0.44–1.00)
GFR calc Af Amer: >60 mL/min (ref >60)
GFR calc non Af Amer: >60 mL/min (ref >60)
Glucose, Bld: 324 mg/dL — ABNORMAL HIGH (ref 70–99)
Potassium: 3.6 mmol/L (ref 3.5–5.1)
Sodium: 136 mmol/L (ref 135–145)
Total Bilirubin: 0.5 mg/dL (ref 0.3–1.2)
Total Protein: 5.6 g/dL — ABNORMAL LOW (ref 6.5–8.1)

## 2020-03-24 LAB — CBC
HCT: 27.8 % — ABNORMAL LOW (ref 36.0–46.0)
Hemoglobin: 9.3 g/dL — ABNORMAL LOW (ref 12.0–15.0)
MCH: 27.8 pg (ref 26.0–34.0)
MCHC: 33.5 g/dL (ref 30.0–36.0)
MCV: 83 fL (ref 80.0–100.0)
Platelets: 129 10*3/uL — ABNORMAL LOW (ref 150–400)
RBC: 3.35 MIL/uL — ABNORMAL LOW (ref 3.87–5.11)
RDW: 15.1 % (ref 11.5–15.5)
WBC: 5.8 10*3/uL (ref 4.0–10.5)
nRBC: 0 % (ref 0.0–0.2)

## 2020-03-24 LAB — URINALYSIS, ROUTINE W REFLEX MICROSCOPIC
Bilirubin Urine: NEGATIVE
Glucose, UA: >=500 mg/dL — AB
Hgb urine dipstick: NEGATIVE
Ketones, ur: NEGATIVE mg/dL
Leukocytes,Ua: NEGATIVE
Nitrite: NEGATIVE
Protein, ur: NEGATIVE mg/dL
Specific Gravity, Urine: 1.022 (ref 1.005–1.030)
pH: 6 (ref 5.0–8.0)

## 2020-03-24 LAB — TROPONIN I (HIGH SENSITIVITY)
Troponin I (High Sensitivity): 5 ng/L (ref <18)
Troponin I (High Sensitivity): 5 ng/L (ref <18)

## 2020-03-24 LAB — TSH: TSH: 2.001 u[IU]/mL (ref 0.350–4.500)

## 2020-03-24 LAB — GLUCOSE, CAPILLARY: Glucose-Capillary: 265 mg/dL — ABNORMAL HIGH (ref 70–99)

## 2020-03-24 MED ORDER — TRUE METRIX BLOOD GLUCOSE TEST VI STRP: ORAL_STRIP | 2 refills | Status: DC

## 2020-03-24 MED ORDER — ACCU-CHEK SOFTCLIX LANCETS MISC: 100.0000 | Freq: Four times a day (QID) | 12 refills | Status: DC

## 2020-03-24 NOTE — MAU Note (Signed)
Presents with c/o elevated BP's and CBG.  Reports BP was 161/90 and 141/98.  Also states CBG was 203 mg/dl this morning after breakfast.  Checked CBG @ 1100 and has rechecked.  Denies VB or LOF.

## 2020-03-24 NOTE — Progress Notes (Signed)
TELEHEALTH VIRTUAL OBSTETRICS VISIT ENCOUNTER NOTE  Clinic: [facility]  I connected with Joann Meyers on 03/24/20 at  2:55 PM EDT by telephone at home and verified that I am speaking with the correct person using two identifiers.   I discussed the limitations, risks, security and privacy concerns of performing an evaluation and management service by telephone and the availability of in person appointments. I also discussed with the patient that there may be a patient responsible charge related to this service. The patient expressed understanding and agreed to proceed.  Subjective:  Joann Meyers is a 34 y.o. G1P0000 at [redacted]w[redacted]d being followed for ongoing prenatal care.  She is currently monitored for the following issues for this high-risk pregnancy and has History of stroke; Type 2 diabetes mellitus without complication, without long-term current use of insulin ([place]); Microcytic anemia; Chest pain; Non-compliance; Obesity (BMI 30-39.9); Left-sided weakness; TIA (transient ischemic attack); Thyroid nodule; Supervision of high risk pregnancy, antepartum; Preexisting diabetes complicating pregnancy, antepartum; Seizures ([place]); Headache in pregnancy, antepartum, second trimester; and 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HCC) on their problem list.  Reports fetal movement. Denies any contractions, bleeding or leaking of fluid.   The following portions of the patient's history were reviewed and updated as appropriate: allergies, current medications, past family history, past medical history, past social history, past surgical history and problem list.   Objective:   Vitals:   03/24/20 1508 03/24/20 1513  BP: (!) 141/87 (!) 161/86  Pulse:  88    Babyscripts Data Reviewed: not applicable  General:  Alert, oriented and cooperative.   Mental Status: Normal mood and affect perceived. Normal judgment and thought content.  Rest of physical exam deferred due to type  of encounter  Assessment and Plan:  Pregnancy: G1P0000 at [redacted]w[redacted]d 1. Headache in pregnancy, antepartum, second trimester Patient states she's been having a HA for the past few days. She denies any visual s/s. She denies any h/o of high blood pressure outside of pregnancy but looking in her flowsheet she probably does have cHTN.   She states she does not have a PCP or anyone that follows her for her h/o strokes, carotid dissection to discuss re: her current situation. I told her I recommend MAU evaluation to further assess her HA and to d/w neurology or vascular surgery re: BP goal and further management given her history. It looks like she has a new pt appt with vascular on 6/22 and cardiology then too.   2. Preexisting diabetes complicating pregnancy, antepartum Pt states no supplies were sent to [employer]. She saw diabetes today and they sent a meter but she states she needs strips and lancets sent in. She states she has only taken her CBG once using her mother's meter and that was today 26m after lunch and it was 203.  I recommend an AFP, a1c and random CBG in the MAU to assess this  3. Poor compliance  4. History of stroke  5. Supervision of high risk pregnancy, antepartum Low dose ASA recommended Pt with low FF on cffdna. Will need re-draw with next in person visit   6. Obesity (BMI 30-39.9)  7. Obesity in pregnancy  8. 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HCC)  Preterm labor symptoms and general obstetric precautions including but not limited to vaginal bleeding, contractions, leaking of fluid and fetal movement were reviewed in detail with the patient.  I discussed the assessment and treatment plan with the patient. The patient was provided an opportunity to  ask questions and all were answered. The patient agreed with the plan and demonstrated an understanding of the instructions. The patient was advised to call back or seek an in-person office evaluation/go to [facility]U at [facility] for any urgent or concerning symptoms. Please refer to After Visit Summary for other counseling recommendations.   I provided 15 minutes of non-face-to-face time during this encounter. The visit was conducted via MyChart-medicine  No follow-ups on file.  Future Appointments  Date Time Provider [place]  04/06/2020 10:00 AM [facility] VASC 6 [facility]  04/06/2020 11:20 AM Early, Arvilla Meres, MD [facility]  04/07/2020  8:15 AM [facility] NURSE [facility]  04/07/2020  8:15 AM [facility] US2 [facility] [place]    Aletha Halim, MD [facility], [place]

## 2020-03-24 NOTE — Progress Notes (Signed)
Pt states told provider at last visit, Accu check meter was broke & another one was suppose to be sent to [employer].

## 2020-03-24 NOTE — Progress Notes (Signed)
Diabetes Self-Management Education  Visit Type: First/Initial  03/24/2020  [name], identified by name and date of birth, is a 34 y.o. female with a diagnosis of Diabetes: Type 2(Type 2 Diabetes in Pregnancy).   ASSESSMENT  Weight 190 lb 1.6 oz (86.2 kg). Body mass index is 34.77 kg/m.   Patient has had Type 2 diabetes since 2016 (per patient.) Patient states this is her first pregnancy. States she used to check her blood sugar, however her meter broke ~1 year ago and she has not been checking since. Most recent A1c was 7.0% on 03/03/2020.  Typical meal pattern is 2-3 meals per day plus snacks. States that, if she experiences nausea, it is typically in the evenings. States she was told to drink Sprite to help with this so she drinks about 1 can per day. States she dislikes chicken and milk, and is allergic to seafood. May have eggs, [place] yogurt, bologna, [place], and nuts as protein sources. States she likes fruit and usually will eat a lot at one time.   Diabetes Self-Management Education - 03/24/20 1059      Visit Information   Visit Type  First/Initial      Initial Visit   Diabetes Type  Type 2   Type 2 Diabetes in Pregnancy   Are you currently following a meal plan?  No    Are you taking your medications as prescribed?  Yes    Date Diagnosed  2016      Psychosocial Assessment   Self-care barriers  Low literacy    Patient Concerns  Glycemic Control    Preferred Learning Style  No preference indicated    Learning Readiness  Ready    How often do you need to have someone help you when you read instructions, pamphlets, or other written materials from your doctor or pharmacy?  3 - Sometimes    What is the last grade level you completed in school?  17EY      Complications   Last HgB A1C per patient/outside source  7 %   03/03/2020   How often do you check your blood sugar?  0 times/day (not testing)   meter has been broken for 1 year per pt   Number of hypoglycemic  episodes per month  --   pt reported a low BG of 45 within the past week; used her mom's meter to check     Dietary Intake   Breakfast  oodles of noodles + orange    Snack (morning)  hot chips    Lunch  bologna sandwich w/ ketchup & mustard    Snack (afternoon)  strawberries    Dinner  rice + beans + tomatoes    Beverage(s)  water, Sprite, juice      Exercise   Exercise Type  ADL's      Patient Education   Previous Diabetes Education  No    Disease state   Explored patient's options for treatment of their diabetes    Nutrition management   Role of diet in the treatment of diabetes and the relationship between the three main macronutrients and blood glucose level;Reviewed blood glucose goals for pre and post meals and how to evaluate the patients' food intake on their blood glucose level.;Meal options for control of blood glucose level and chronic complications.;Food label reading, portion sizes and measuring food.    Monitoring  Purpose and frequency of SMBG.;Taught/discussed recording of test results and interpretation of SMBG.;Identified appropriate SMBG and/or A1C goals.  Preconception care  Pregnancy and GDM  Role of pre-pregnancy blood glucose control on the development of the fetus;Reviewed with patient blood glucose goals with pregnancy      Individualized Goals (developed by patient)   Nutrition  Follow meal plan discussed    Medications  take my medication as prescribed    Monitoring   test my blood glucose as discussed   4 times/day     Outcomes   Expected Outcomes  Demonstrated interest in learning. Expect positive outcomes    Future DMSE  PRN       Individualized Plan for Diabetes Self-Management Training:  Learning Objective:  Patient will have a greater understanding of diabetes self-management. Patient education plan is to attend individual and/or group sessions per assessed needs and concerns.  Glucometer Provided Accu-Chek Guide Me Lot# [id number] Exp:  06/04/21  Patient Instructions  Monitor the foods you eat to spread out your intake of carbohydrates throughout the day.  Always include a source of protein with your meals and snacks.  Check your blood sugar 4 times per day: fasting, and 2 hrs after breakfast, lunch, and dinner.  Remember to thoroughly cook/heat up deli meats (bologna, ham, [place], etc.) if you choose to eat them.   Expected Outcomes:  Demonstrated interest in learning. Expect positive outcomes  Education material provided: Type 2 Diabetes in Pregnancy, Blood Glucose Log  If problems or questions, patient to contact team via:  Phone and Email  Future DSME appointment: PRN

## 2020-03-24 NOTE — MAU Note (Signed)
PT transported to ED in wheelchair by RN.  Report given ED charge nurse and ED Triage nurse.
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
## 2020-03-24 NOTE — Patient Instructions (Addendum)
Monitor the foods you eat to spread out your intake of carbohydrates throughout the day.  Always include a source of protein with your meals and snacks.  Check your blood sugar 4 times per day: fasting, and 2 hrs after breakfast, lunch, and dinner.  Remember to thoroughly cook/heat up deli meats (bologna, ham, [place], etc.) if you choose to eat them.  Ask your doctor to write a prescription for more lancets and test strips for your Accu-Chek Guide Me meter.

## 2020-03-24 NOTE — ED Notes (Signed)
Pt transferred from MAU, pt sent to MAU for HTN and elevated blood sugars. Pt also c/o chest pain that started on Monday, but has since resolved. Denies pain at this time.
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
## 2020-03-25 ENCOUNTER — Encounter: Payer: Self-pay | Admitting: *Deleted

## 2020-03-25 NOTE — Discharge Instructions (Signed)
You are seen in the emergency department today for high blood pressure, high blood sugar, headache, and chest pain. Your work-up in the emergency department was overall reassuring. Please continue to monitor your blood sugars at home. He states Tylenol per over-the-counter dosing to help with head or chest discomfort. Please follow-up very closely with your OB/GYN doctor within the next 3 days for reevaluation and further management of your blood sugar. Return to the emergency department immediately for new or worsening symptoms including but not limited to worsening head or chest pain, change in quality or severity of headache, change in location of headache, trouble breathing, double or blurry vision, passing out, numbness, weakness, dizziness, trouble breathing, coughing up blood, or any other concerns.

## 2020-03-25 NOTE — Telephone Encounter (Signed)
Update results show normal repeat Panorama per Dr. Rosana Hoes.  Notified pt results.  Pt verbalized understanding.    Mel Almond, RN  03/25/20

## 2020-03-30 ENCOUNTER — Telehealth: Payer: Self-pay

## 2020-03-30 ENCOUNTER — Other Ambulatory Visit: Payer: Self-pay | Admitting: *Deleted

## 2020-03-30 DIAGNOSIS — I7771 Dissection of carotid artery: Secondary | ICD-10-CM

## 2020-03-30 NOTE — Telephone Encounter (Addendum)
Pt called requesting gender results.  Called pt and informed pt that we do not give results over the phone nor do we give MyChart message.  I advised pt to get results throught MyChart clicking on Test results.  Pt verbalized understanding.   Mel Almond, RN

## 2020-04-06 ENCOUNTER — Ambulatory Visit (HOSPITAL_COMMUNITY)
Admission: RE | Admit: 2020-04-06 | Discharge: 2020-04-06 | Disposition: A | Discharge status: Home / Self Care | Payer: Medicaid Other | Source: Ambulatory Visit | Attending: Vascular Surgery | Admitting: Vascular Surgery

## 2020-04-06 ENCOUNTER — Other Ambulatory Visit: Payer: Self-pay

## 2020-04-06 ENCOUNTER — Encounter: Payer: Self-pay | Admitting: Vascular Surgery

## 2020-04-06 ENCOUNTER — Ambulatory Visit (INDEPENDENT_AMBULATORY_CARE_PROVIDER_SITE_OTHER): Payer: Medicaid Other | Admitting: Vascular Surgery

## 2020-04-06 VITALS — BP 129/80 | HR 82 | Temp 98.1°F | Resp 18 | Ht 60.0 in | Wt 192.7 lb

## 2020-04-06 DIAGNOSIS — I7771 Dissection of carotid artery: Secondary | ICD-10-CM | POA: Insufficient documentation
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
## 2020-04-07 ENCOUNTER — Encounter: Payer: Self-pay | Admitting: *Deleted

## 2020-04-07 ENCOUNTER — Other Ambulatory Visit: Payer: Self-pay | Admitting: *Deleted

## 2020-04-07 ENCOUNTER — Ambulatory Visit: Payer: Medicaid Other | Attending: Obstetrics & Gynecology

## 2020-04-07 ENCOUNTER — Ambulatory Visit: Payer: Medicaid Other | Admitting: *Deleted

## 2020-04-07 DIAGNOSIS — E669 Obesity, unspecified: Secondary | ICD-10-CM

## 2020-04-07 DIAGNOSIS — O99352 Diseases of the nervous system complicating pregnancy, second trimester: Secondary | ICD-10-CM

## 2020-04-07 DIAGNOSIS — Z363 Encounter for antenatal screening for malformations: Secondary | ICD-10-CM | POA: Diagnosis not present

## 2020-04-07 DIAGNOSIS — E041 Nontoxic single thyroid nodule: Secondary | ICD-10-CM | POA: Insufficient documentation

## 2020-04-07 DIAGNOSIS — Z8673 Personal history of transient ischemic attack (TIA), and cerebral infarction without residual deficits: Secondary | ICD-10-CM | POA: Diagnosis present

## 2020-04-07 DIAGNOSIS — O99212 Obesity complicating pregnancy, second trimester: Secondary | ICD-10-CM

## 2020-04-07 DIAGNOSIS — Z3A18 18 weeks gestation of pregnancy: Secondary | ICD-10-CM

## 2020-04-07 DIAGNOSIS — O24312 Unspecified pre-existing diabetes mellitus in pregnancy, second trimester: Secondary | ICD-10-CM | POA: Diagnosis not present

## 2020-04-07 DIAGNOSIS — O24319 Unspecified pre-existing diabetes mellitus in pregnancy, unspecified trimester: Secondary | ICD-10-CM

## 2020-04-07 DIAGNOSIS — O0990 Supervision of high risk pregnancy, unspecified, unspecified trimester: Secondary | ICD-10-CM | POA: Diagnosis present

## 2020-04-07 DIAGNOSIS — R569 Unspecified convulsions: Secondary | ICD-10-CM | POA: Diagnosis present

## 2020-04-07 DIAGNOSIS — Z362 Encounter for other antenatal screening follow-up: Secondary | ICD-10-CM

## 2020-04-07 DIAGNOSIS — G40909 Epilepsy, unspecified, not intractable, without status epilepticus: Secondary | ICD-10-CM

## 2020-04-07 DIAGNOSIS — O2690 Pregnancy related conditions, unspecified, unspecified trimester: Secondary | ICD-10-CM

## 2020-04-07 DIAGNOSIS — O10012 Pre-existing essential hypertension complicating pregnancy, second trimester: Secondary | ICD-10-CM

## 2020-04-07 NOTE — Progress Notes (Signed)
Pt reports leaking clear watery fluid x 1 week.

## 2020-04-15 ENCOUNTER — Other Ambulatory Visit: Payer: Self-pay | Admitting: Obstetrics & Gynecology

## 2020-04-15 ENCOUNTER — Telehealth: Payer: Self-pay

## 2020-04-15 DIAGNOSIS — Z8673 Personal history of transient ischemic attack (TIA), and cerebral infarction without residual deficits: Secondary | ICD-10-CM

## 2020-04-15 DIAGNOSIS — E119 Type 2 diabetes mellitus without complications: Secondary | ICD-10-CM

## 2020-04-15 MED ORDER — ASPIRIN EC 81 MG PO TBEC: 162.0000 mg | DELAYED_RELEASE_TABLET | Freq: Every day | ORAL | 2 refills | Status: DC

## 2020-04-15 NOTE — Telephone Encounter (Addendum)
-----   Message from Woodroe Mode, MD sent at 04/15/2020  1:17 PM EDT ----- Pt to increase ASA to 2 tablets, 162 mg daily ----- Message ----- From: Donnamae Jude, MD Sent: 04/15/2020  12:49 PM EDT To: Woodroe Mode, MD  Please place on 162 mg of ASA daily  Called pt and L/M to return call to the office or respond to [place] message.  MyChart message sent.   Mel Almond, RN

## 2020-04-15 NOTE — Progress Notes (Signed)
Increase to 162 mg ASA daily

## 2020-04-16 ENCOUNTER — Inpatient Hospital Stay (HOSPITAL_COMMUNITY)
Admission: AD | Admit: 2020-04-16 | Discharge: 2020-04-17 | Disposition: A | Discharge status: Home / Self Care | Payer: Medicaid Other | Attending: Family Medicine | Admitting: Family Medicine

## 2020-04-16 ENCOUNTER — Other Ambulatory Visit: Payer: Self-pay

## 2020-04-16 ENCOUNTER — Encounter (HOSPITAL_COMMUNITY): Payer: Self-pay | Admitting: Family Medicine

## 2020-04-16 DIAGNOSIS — O24319 Unspecified pre-existing diabetes mellitus in pregnancy, unspecified trimester: Secondary | ICD-10-CM

## 2020-04-16 DIAGNOSIS — R079 Chest pain, unspecified: Secondary | ICD-10-CM | POA: Diagnosis not present

## 2020-04-16 DIAGNOSIS — O26892 Other specified pregnancy related conditions, second trimester: Secondary | ICD-10-CM | POA: Diagnosis not present

## 2020-04-16 DIAGNOSIS — O24112 Pre-existing diabetes mellitus, type 2, in pregnancy, second trimester: Secondary | ICD-10-CM | POA: Insufficient documentation

## 2020-04-16 DIAGNOSIS — Z885 Allergy status to narcotic agent status: Secondary | ICD-10-CM | POA: Insufficient documentation

## 2020-04-16 DIAGNOSIS — Z9119 Patient's noncompliance with other medical treatment and regimen: Secondary | ICD-10-CM | POA: Insufficient documentation

## 2020-04-16 DIAGNOSIS — Z7982 Long term (current) use of aspirin: Secondary | ICD-10-CM | POA: Insufficient documentation

## 2020-04-16 DIAGNOSIS — Z833 Family history of diabetes mellitus: Secondary | ICD-10-CM | POA: Insufficient documentation

## 2020-04-16 DIAGNOSIS — Z79899 Other long term (current) drug therapy: Secondary | ICD-10-CM | POA: Insufficient documentation

## 2020-04-16 DIAGNOSIS — R109 Unspecified abdominal pain: Secondary | ICD-10-CM | POA: Insufficient documentation

## 2020-04-16 DIAGNOSIS — Z8673 Personal history of transient ischemic attack (TIA), and cerebral infarction without residual deficits: Secondary | ICD-10-CM | POA: Diagnosis not present

## 2020-04-16 DIAGNOSIS — Z7984 Long term (current) use of oral hypoglycemic drugs: Secondary | ICD-10-CM | POA: Diagnosis not present

## 2020-04-16 DIAGNOSIS — R1011 Right upper quadrant pain: Secondary | ICD-10-CM | POA: Diagnosis not present

## 2020-04-16 DIAGNOSIS — Z3A20 20 weeks gestation of pregnancy: Secondary | ICD-10-CM | POA: Diagnosis not present

## 2020-04-16 DIAGNOSIS — O0990 Supervision of high risk pregnancy, unspecified, unspecified trimester: Secondary | ICD-10-CM

## 2020-04-16 DIAGNOSIS — R569 Unspecified convulsions: Secondary | ICD-10-CM

## 2020-04-16 LAB — GLUCOSE, CAPILLARY: Glucose-Capillary: 333 mg/dL — ABNORMAL HIGH (ref 70–99)

## 2020-04-16 NOTE — MAU Note (Addendum)
Under right breast pain, points to rib area since noon today, was couldn't breathe and was short of breath. Vomited 3 times at 9 pm. Shortness of breath went away at 10 pm but still having rib pain, comes and goes. It is sharp like a knife.  Hurts to take a deep breath. No bleeding. No leaking. Reports has been diabetic since 2016 and stopped taking Metformin for a week due to nausea.  Blood sugar 97 this am, at lunch was 150, then was 314 at 7pm.  Came in by EMS, blood sugar was 358 by EMS.

## 2020-04-17 ENCOUNTER — Inpatient Hospital Stay (HOSPITAL_COMMUNITY): Payer: Medicaid Other

## 2020-04-17 DIAGNOSIS — Z3A20 20 weeks gestation of pregnancy: Secondary | ICD-10-CM

## 2020-04-17 DIAGNOSIS — Z794 Long term (current) use of insulin: Secondary | ICD-10-CM

## 2020-04-17 DIAGNOSIS — O24112 Pre-existing diabetes mellitus, type 2, in pregnancy, second trimester: Secondary | ICD-10-CM

## 2020-04-17 DIAGNOSIS — E1165 Type 2 diabetes mellitus with hyperglycemia: Secondary | ICD-10-CM

## 2020-04-17 DIAGNOSIS — O26892 Other specified pregnancy related conditions, second trimester: Secondary | ICD-10-CM

## 2020-04-17 DIAGNOSIS — R1011 Right upper quadrant pain: Secondary | ICD-10-CM

## 2020-04-17 LAB — URINALYSIS, ROUTINE W REFLEX MICROSCOPIC
Bilirubin Urine: NEGATIVE
Glucose, UA: >=500 mg/dL — AB
Hgb urine dipstick: NEGATIVE
Ketones, ur: NEGATIVE mg/dL
Leukocytes,Ua: NEGATIVE
Nitrite: NEGATIVE
Protein, ur: NEGATIVE mg/dL
Specific Gravity, Urine: 1.025 (ref 1.005–1.030)
pH: 7 (ref 5.0–8.0)

## 2020-04-17 LAB — COMPREHENSIVE METABOLIC PANEL
ALT: 16 U/L (ref 0–44)
AST: 17 U/L (ref 15–41)
Albumin: 3 g/dL — ABNORMAL LOW (ref 3.5–5.0)
Alkaline Phosphatase: 42 U/L (ref 38–126)
Anion gap: 8 (ref 5–15)
BUN: 8 mg/dL (ref 6–20)
CO2: 19 mmol/L — ABNORMAL LOW (ref 22–32)
Calcium: 8.7 mg/dL — ABNORMAL LOW (ref 8.9–10.3)
Chloride: 107 mmol/L (ref 98–111)
Creatinine, Ser: 0.49 mg/dL (ref 0.44–1.00)
GFR calc Af Amer: >60 mL/min (ref >60)
GFR calc non Af Amer: >60 mL/min (ref >60)
Glucose, Bld: 318 mg/dL — ABNORMAL HIGH (ref 70–99)
Potassium: 4 mmol/L (ref 3.5–5.1)
Sodium: 134 mmol/L — ABNORMAL LOW (ref 135–145)
Total Bilirubin: 0.7 mg/dL (ref 0.3–1.2)
Total Protein: 5.6 g/dL — ABNORMAL LOW (ref 6.5–8.1)

## 2020-04-17 LAB — CBC WITH DIFFERENTIAL/PLATELET
Abs Immature Granulocytes: 0.03 10*3/uL (ref 0.00–0.07)
Basophils Absolute: 0 10*3/uL (ref 0.0–0.1)
Basophils Relative: 1 %
Eosinophils Absolute: 0.1 10*3/uL (ref 0.0–0.5)
Eosinophils Relative: 2 %
HCT: 28 % — ABNORMAL LOW (ref 36.0–46.0)
Hemoglobin: 9.4 g/dL — ABNORMAL LOW (ref 12.0–15.0)
Immature Granulocytes: 1 %
Lymphocytes Relative: 16 %
Lymphs Abs: 0.8 10*3/uL (ref 0.7–4.0)
MCH: 28.6 pg (ref 26.0–34.0)
MCHC: 33.6 g/dL (ref 30.0–36.0)
MCV: 85.1 fL (ref 80.0–100.0)
Monocytes Absolute: 0.5 10*3/uL (ref 0.1–1.0)
Monocytes Relative: 9 %
Neutro Abs: 3.9 10*3/uL (ref 1.7–7.7)
Neutrophils Relative %: 71 %
Platelets: 135 10*3/uL — ABNORMAL LOW (ref 150–400)
RBC: 3.29 MIL/uL — ABNORMAL LOW (ref 3.87–5.11)
RDW: 16.3 % — ABNORMAL HIGH (ref 11.5–15.5)
WBC: 5.3 10*3/uL (ref 4.0–10.5)
nRBC: 0 % (ref 0.0–0.2)

## 2020-04-17 LAB — LIPASE, BLOOD: Lipase: 48 U/L (ref 11–51)

## 2020-04-17 LAB — BETA-HYDROXYBUTYRIC ACID: Beta-Hydroxybutyric Acid: 0.09 mmol/L (ref 0.05–0.27)

## 2020-04-17 LAB — GLUCOSE, CAPILLARY: Glucose-Capillary: 192 mg/dL — ABNORMAL HIGH (ref 70–99)

## 2020-04-17 MED ORDER — INSULIN ASPART 100 UNIT/ML ~~LOC~~ SOLN
5.0000 [IU] | Freq: Once | SUBCUTANEOUS | Status: AC
  Administered 2020-04-17: 5 [IU] via SUBCUTANEOUS

## 2020-04-17 MED ORDER — FERROUS SULFATE 325 (65 FE) MG PO TABS
325.0000 mg | ORAL_TABLET | Freq: Two times a day (BID) | ORAL | 3 refills | Status: DC
Start: 2020-04-17 — End: 2020-07-16

## 2020-04-17 MED ORDER — LACTATED RINGERS IV BOLUS
1000.0000 mL | Freq: Once | INTRAVENOUS | Status: AC
  Administered 2020-04-17: 1000 mL via INTRAVENOUS

## 2020-04-17 MED ORDER — ONDANSETRON 4 MG PO TBDP: 4.0000 mg | ORAL_TABLET | Freq: Two times a day (BID) | ORAL | 2 refills | Status: DC

## 2020-04-17 MED ORDER — HYDROMORPHONE HCL 1 MG/ML IJ SOLN
0.5000 mg | Freq: Once | INTRAMUSCULAR | Status: DC
  Filled 2020-04-17: qty 1
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
## 2020-04-17 NOTE — Discharge Instructions (Signed)

## 2020-04-21 ENCOUNTER — Ambulatory Visit (INDEPENDENT_AMBULATORY_CARE_PROVIDER_SITE_OTHER): Payer: Medicaid Other | Admitting: Obstetrics & Gynecology

## 2020-04-21 ENCOUNTER — Other Ambulatory Visit: Payer: Self-pay

## 2020-04-21 VITALS — BP 136/74 | HR 99 | Wt 193.9 lb

## 2020-04-21 DIAGNOSIS — Z7984 Long term (current) use of oral hypoglycemic drugs: Secondary | ICD-10-CM

## 2020-04-21 DIAGNOSIS — O0992 Supervision of high risk pregnancy, unspecified, second trimester: Secondary | ICD-10-CM

## 2020-04-21 DIAGNOSIS — E669 Obesity, unspecified: Secondary | ICD-10-CM

## 2020-04-21 DIAGNOSIS — O99210 Obesity complicating pregnancy, unspecified trimester: Secondary | ICD-10-CM

## 2020-04-21 DIAGNOSIS — Z3A20 20 weeks gestation of pregnancy: Secondary | ICD-10-CM

## 2020-04-21 DIAGNOSIS — O99212 Obesity complicating pregnancy, second trimester: Secondary | ICD-10-CM

## 2020-04-21 DIAGNOSIS — O0990 Supervision of high risk pregnancy, unspecified, unspecified trimester: Secondary | ICD-10-CM

## 2020-04-21 DIAGNOSIS — E119 Type 2 diabetes mellitus without complications: Secondary | ICD-10-CM

## 2020-04-21 DIAGNOSIS — O24112 Pre-existing diabetes mellitus, type 2, in pregnancy, second trimester: Secondary | ICD-10-CM

## 2020-04-21 MED ORDER — GLYBURIDE 2.5 MG PO TABS: 2.5000 mg | ORAL_TABLET | Freq: Every day | ORAL | 3 refills | Status: DC

## 2020-04-21 NOTE — Progress Notes (Signed)
   PRENATAL VISIT NOTE  Subjective:  Joann Meyers is a 34 y.o. G1P0000 at [redacted]w[redacted]d being seen today for ongoing prenatal care.  She is currently monitored for the following issues for this high-risk pregnancy and has History of stroke; Type 2 diabetes mellitus without complication, without long-term current use of insulin ([place]); Microcytic anemia; Chest pain; Non-compliance; Obesity (BMI 30-39.9); Left-sided weakness; TIA (transient ischemic attack); Thyroid nodule; Supervision of high risk pregnancy, antepartum; Preexisting diabetes complicating pregnancy, antepartum; Seizures ([place]); Headache in pregnancy, antepartum, second trimester; 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[place]); Hypertension during pregnancy, antepartum; and Obesity in pregnancy on their problem list.  Patient reports nausea, vomiting and when she takes metformin.  Contractions: Not present. Vag. Bleeding: None.  Movement: Absent. Denies leaking of fluid.   The following portions of the patient's history were reviewed and updated as appropriate: allergies, current medications, past family history, past medical history, past social history, past surgical history and problem list.   Objective:   Vitals:   04/21/20 1616  BP: 136/74  Pulse: 99  Weight: 193 lb 14.4 oz (88 kg)    Fetal Status: Fetal Heart Rate (bpm): 153   Movement: Absent     General:  Alert, oriented and cooperative. Patient is in no acute distress.  Skin: Skin is warm and dry. No rash noted.   Cardiovascular: Normal heart rate noted  Respiratory: Normal respiratory effort, no problems with respiration noted  Abdomen: Soft, gravid, appropriate for gestational age.  Pain/Pressure: Present     Pelvic: Cervical exam deferred        Extremities: Normal range of motion.  Edema: None  Mental Status: Normal mood and affect. Normal behavior. Normal judgment and thought content.   Assessment and Plan:  Pregnancy: G1P0000 at [redacted]w[redacted]d 1. Type 2 diabetes mellitus without  complication, without long-term current use of insulin (HCC) Change oral medication due to nausea - glyBURIDE (DIABETA) 2.5 MG tablet; Take 1 tablet (2.5 mg total) by mouth daily before supper.  Dispense: 30 tablet; Refill: 3 States FBS and PP in range 2. Supervision of high risk pregnancy, antepartum  Urged compliance with BG regimen 3. Obesity in pregnancy   Preterm labor symptoms and general obstetric precautions including but not limited to vaginal bleeding, contractions, leaking of fluid and fetal movement were reviewed in detail with the patient. Please refer to After Visit Summary for other counseling recommendations.   Return in about 2 weeks (around 05/05/2020).  Future Appointments  Date Time Provider [place]  05/05/2020 11:15 AM [facility] NURSE [facility]  05/05/2020 11:15 AM [facility] US2 [facility]    Emeterio Reeve, MD

## 2020-04-21 NOTE — Patient Instructions (Signed)

## 2020-04-21 NOTE — Progress Notes (Signed)
Pt has not been recording Glucose.

## 2020-04-28 ENCOUNTER — Encounter: Payer: Self-pay | Admitting: Pediatric Cardiology

## 2020-05-05 ENCOUNTER — Other Ambulatory Visit: Payer: Self-pay | Admitting: *Deleted

## 2020-05-05 ENCOUNTER — Encounter: Payer: Medicaid Other | Admitting: Obstetrics and Gynecology

## 2020-05-05 ENCOUNTER — Ambulatory Visit: Payer: Medicaid Other | Admitting: *Deleted

## 2020-05-05 ENCOUNTER — Ambulatory Visit (HOSPITAL_BASED_OUTPATIENT_CLINIC_OR_DEPARTMENT_OTHER): Payer: Medicaid Other

## 2020-05-05 ENCOUNTER — Other Ambulatory Visit: Payer: Self-pay

## 2020-05-05 DIAGNOSIS — O2692 Pregnancy related conditions, unspecified, second trimester: Secondary | ICD-10-CM

## 2020-05-05 DIAGNOSIS — Z362 Encounter for other antenatal screening follow-up: Secondary | ICD-10-CM | POA: Insufficient documentation

## 2020-05-05 DIAGNOSIS — R569 Unspecified convulsions: Secondary | ICD-10-CM

## 2020-05-05 DIAGNOSIS — O0990 Supervision of high risk pregnancy, unspecified, unspecified trimester: Secondary | ICD-10-CM

## 2020-05-05 DIAGNOSIS — O10919 Unspecified pre-existing hypertension complicating pregnancy, unspecified trimester: Secondary | ICD-10-CM

## 2020-05-05 DIAGNOSIS — E669 Obesity, unspecified: Secondary | ICD-10-CM

## 2020-05-05 DIAGNOSIS — O99212 Obesity complicating pregnancy, second trimester: Secondary | ICD-10-CM

## 2020-05-05 DIAGNOSIS — O24312 Unspecified pre-existing diabetes mellitus in pregnancy, second trimester: Secondary | ICD-10-CM

## 2020-05-05 DIAGNOSIS — O99352 Diseases of the nervous system complicating pregnancy, second trimester: Secondary | ICD-10-CM

## 2020-05-05 DIAGNOSIS — O24319 Unspecified pre-existing diabetes mellitus in pregnancy, unspecified trimester: Secondary | ICD-10-CM | POA: Insufficient documentation

## 2020-05-05 DIAGNOSIS — Z3A22 22 weeks gestation of pregnancy: Secondary | ICD-10-CM

## 2020-05-05 DIAGNOSIS — G40909 Epilepsy, unspecified, not intractable, without status epilepticus: Secondary | ICD-10-CM

## 2020-05-05 DIAGNOSIS — O10012 Pre-existing essential hypertension complicating pregnancy, second trimester: Secondary | ICD-10-CM | POA: Diagnosis not present

## 2020-05-05 DIAGNOSIS — Z363 Encounter for antenatal screening for malformations: Secondary | ICD-10-CM | POA: Diagnosis not present

## 2020-05-06 ENCOUNTER — Encounter (HOSPITAL_COMMUNITY): Payer: Self-pay | Admitting: Obstetrics & Gynecology

## 2020-05-06 ENCOUNTER — Ambulatory Visit (INDEPENDENT_AMBULATORY_CARE_PROVIDER_SITE_OTHER): Payer: Medicaid Other | Admitting: Obstetrics and Gynecology

## 2020-05-06 ENCOUNTER — Inpatient Hospital Stay (HOSPITAL_COMMUNITY)
Admission: AD | Admit: 2020-05-06 | Discharge: 2020-05-07 | DRG: 833 | Disposition: A | Discharge status: Home / Self Care | Payer: Medicaid Other | Attending: Obstetrics & Gynecology | Admitting: Obstetrics & Gynecology

## 2020-05-06 ENCOUNTER — Other Ambulatory Visit: Payer: Self-pay

## 2020-05-06 VITALS — BP 136/80 | HR 96 | Wt 198.0 lb

## 2020-05-06 DIAGNOSIS — Z3A23 23 weeks gestation of pregnancy: Secondary | ICD-10-CM | POA: Diagnosis not present

## 2020-05-06 DIAGNOSIS — O24112 Pre-existing diabetes mellitus, type 2, in pregnancy, second trimester: Secondary | ICD-10-CM | POA: Diagnosis not present

## 2020-05-06 DIAGNOSIS — Z283 Underimmunization status: Secondary | ICD-10-CM

## 2020-05-06 DIAGNOSIS — O24419 Gestational diabetes mellitus in pregnancy, unspecified control: Secondary | ICD-10-CM

## 2020-05-06 DIAGNOSIS — E119 Type 2 diabetes mellitus without complications: Secondary | ICD-10-CM

## 2020-05-06 DIAGNOSIS — O99891 Other specified diseases and conditions complicating pregnancy: Secondary | ICD-10-CM

## 2020-05-06 DIAGNOSIS — O09899 Supervision of other high risk pregnancies, unspecified trimester: Secondary | ICD-10-CM

## 2020-05-06 DIAGNOSIS — E1165 Type 2 diabetes mellitus with hyperglycemia: Secondary | ICD-10-CM | POA: Diagnosis present

## 2020-05-06 DIAGNOSIS — O99210 Obesity complicating pregnancy, unspecified trimester: Secondary | ICD-10-CM

## 2020-05-06 DIAGNOSIS — Z7984 Long term (current) use of oral hypoglycemic drugs: Secondary | ICD-10-CM

## 2020-05-06 DIAGNOSIS — R519 Headache, unspecified: Secondary | ICD-10-CM

## 2020-05-06 DIAGNOSIS — Z3490 Encounter for supervision of normal pregnancy, unspecified, unspecified trimester: Secondary | ICD-10-CM

## 2020-05-06 DIAGNOSIS — R21 Rash and other nonspecific skin eruption: Secondary | ICD-10-CM

## 2020-05-06 DIAGNOSIS — O26892 Other specified pregnancy related conditions, second trimester: Secondary | ICD-10-CM

## 2020-05-06 DIAGNOSIS — O0990 Supervision of high risk pregnancy, unspecified, unspecified trimester: Secondary | ICD-10-CM

## 2020-05-06 DIAGNOSIS — E669 Obesity, unspecified: Secondary | ICD-10-CM

## 2020-05-06 DIAGNOSIS — Z9119 Patient's noncompliance with other medical treatment and regimen: Secondary | ICD-10-CM

## 2020-05-06 DIAGNOSIS — O24319 Unspecified pre-existing diabetes mellitus in pregnancy, unspecified trimester: Secondary | ICD-10-CM

## 2020-05-06 DIAGNOSIS — Z91199 Patient's noncompliance with other medical treatment and regimen due to unspecified reason: Secondary | ICD-10-CM

## 2020-05-06 DIAGNOSIS — G459 Transient cerebral ischemic attack, unspecified: Secondary | ICD-10-CM

## 2020-05-06 DIAGNOSIS — O10919 Unspecified pre-existing hypertension complicating pregnancy, unspecified trimester: Secondary | ICD-10-CM

## 2020-05-06 DIAGNOSIS — I7771 Dissection of carotid artery: Secondary | ICD-10-CM

## 2020-05-06 LAB — LIPASE, BLOOD: Lipase: 52 U/L — ABNORMAL HIGH (ref 11–51)

## 2020-05-06 LAB — CBC WITH DIFFERENTIAL/PLATELET
Abs Immature Granulocytes: 0.04 10*3/uL (ref 0.00–0.07)
Basophils Absolute: 0 10*3/uL (ref 0.0–0.1)
Basophils Relative: 0 %
Eosinophils Absolute: 0.1 10*3/uL (ref 0.0–0.5)
Eosinophils Relative: 2 %
HCT: 28.6 % — ABNORMAL LOW (ref 36.0–46.0)
Hemoglobin: 9.3 g/dL — ABNORMAL LOW (ref 12.0–15.0)
Immature Granulocytes: 1 %
Lymphocytes Relative: 14 %
Lymphs Abs: 0.8 10*3/uL (ref 0.7–4.0)
MCH: 27.6 pg (ref 26.0–34.0)
MCHC: 32.5 g/dL (ref 30.0–36.0)
MCV: 84.9 fL (ref 80.0–100.0)
Monocytes Absolute: 0.5 10*3/uL (ref 0.1–1.0)
Monocytes Relative: 10 %
Neutro Abs: 3.9 10*3/uL (ref 1.7–7.7)
Neutrophils Relative %: 73 %
Platelets: 137 10*3/uL — ABNORMAL LOW (ref 150–400)
RBC: 3.37 MIL/uL — ABNORMAL LOW (ref 3.87–5.11)
RDW: 16.1 % — ABNORMAL HIGH (ref 11.5–15.5)
WBC: 5.4 10*3/uL (ref 4.0–10.5)
nRBC: 0 % (ref 0.0–0.2)

## 2020-05-06 LAB — GLUCOSE, CAPILLARY
Glucose-Capillary: 300 mg/dL — ABNORMAL HIGH (ref 70–99)
Glucose-Capillary: 299 mg/dL — ABNORMAL HIGH (ref 70–99)
Glucose-Capillary: 336 mg/dL — ABNORMAL HIGH (ref 70–99)
Glucose-Capillary: 245 mg/dL — ABNORMAL HIGH (ref 70–99)
Glucose-Capillary: 171 mg/dL — ABNORMAL HIGH (ref 70–99)
Glucose-Capillary: 250 mg/dL — ABNORMAL HIGH (ref 70–99)
Glucose-Capillary: 192 mg/dL — ABNORMAL HIGH (ref 70–99)

## 2020-05-06 LAB — COMPREHENSIVE METABOLIC PANEL
ALT: 16 U/L (ref 0–44)
AST: 17 U/L (ref 15–41)
Albumin: 3 g/dL — ABNORMAL LOW (ref 3.5–5.0)
Alkaline Phosphatase: 49 U/L (ref 38–126)
Anion gap: 8 (ref 5–15)
BUN: 15 mg/dL (ref 6–20)
CO2: 21 mmol/L — ABNORMAL LOW (ref 22–32)
Calcium: 9.2 mg/dL (ref 8.9–10.3)
Chloride: 104 mmol/L (ref 98–111)
Creatinine, Ser: 0.58 mg/dL (ref 0.44–1.00)
GFR calc Af Amer: >60 mL/min (ref >60)
GFR calc non Af Amer: >60 mL/min (ref >60)
Glucose, Bld: 303 mg/dL — ABNORMAL HIGH (ref 70–99)
Potassium: 3.7 mmol/L (ref 3.5–5.1)
Sodium: 133 mmol/L — ABNORMAL LOW (ref 135–145)
Total Bilirubin: 0.8 mg/dL (ref 0.3–1.2)
Total Protein: 5.8 g/dL — ABNORMAL LOW (ref 6.5–8.1)

## 2020-05-06 LAB — TYPE AND SCREEN
ABO/RH(D): O POS
Antibody Screen: NEGATIVE

## 2020-05-06 LAB — HEMOGLOBIN A1C
Hgb A1c MFr Bld: 6.5 % — ABNORMAL HIGH (ref 4.8–5.6)
Mean Plasma Glucose: 139.85 mg/dL

## 2020-05-06 LAB — BETA-HYDROXYBUTYRIC ACID: Beta-Hydroxybutyric Acid: 0.1 mmol/L (ref 0.05–0.27)

## 2020-05-06 MED ORDER — FOLIC ACID 1 MG PO TABS
1.0000 mg | ORAL_TABLET | Freq: Every day | ORAL | Status: DC
  Administered 2020-05-07: 1 mg via ORAL
  Filled 2020-05-06: qty 1

## 2020-05-06 MED ORDER — PRENATAL MULTIVITAMIN CH
1.0000 | ORAL_TABLET | Freq: Every day | ORAL | Status: DC
Start: 2020-05-06 — End: 2020-05-06

## 2020-05-06 MED ORDER — DEXTROSE-NACL 5-0.45 % IV SOLN: INTRAVENOUS | Status: DC

## 2020-05-06 MED ORDER — GLUCOSE BLOOD VI STRP: ORAL_STRIP | 12 refills | Status: DC

## 2020-05-06 MED ORDER — DOXYLAMINE-PYRIDOXINE 10-10 MG PO TBEC: 2.0000 | DELAYED_RELEASE_TABLET | Freq: Every day | ORAL | 5 refills | Status: DC

## 2020-05-06 MED ORDER — CALCIUM CARBONATE ANTACID 500 MG PO CHEW: 2.0000 | CHEWABLE_TABLET | ORAL | Status: DC | PRN

## 2020-05-06 MED ORDER — ACETAMINOPHEN 325 MG PO TABS: 650.0000 mg | ORAL_TABLET | ORAL | Status: DC | PRN

## 2020-05-06 MED ORDER — SODIUM CHLORIDE 0.9 % IV SOLN: INTRAVENOUS | Status: DC

## 2020-05-06 MED ORDER — DEXTROSE 50 % IV SOLN: 0.0000 mL | INTRAVENOUS | Status: DC | PRN

## 2020-05-06 MED ORDER — ZOLPIDEM TARTRATE 5 MG PO TABS: 5.0000 mg | ORAL_TABLET | Freq: Every evening | ORAL | Status: DC | PRN

## 2020-05-06 MED ORDER — INSULIN NPH (HUMAN) (ISOPHANE) 100 UNIT/ML ~~LOC~~ SUSP
12.0000 [IU] | Freq: Two times a day (BID) | SUBCUTANEOUS | Status: DC
  Administered 2020-05-06 – 2020-05-07 (×2): 12 [IU] via SUBCUTANEOUS
  Filled 2020-05-06: qty 10

## 2020-05-06 MED ORDER — INSULIN ASPART 100 UNIT/ML ~~LOC~~ SOLN: 0.0000 [IU] | Freq: Three times a day (TID) | SUBCUTANEOUS | Status: DC

## 2020-05-06 MED ORDER — ACCU-CHEK GUIDE W/DEVICE KIT
1.0000 | PACK | Freq: Once | 0 refills | Status: DC
Start: 2020-05-06 — End: 2020-05-07

## 2020-05-06 MED ORDER — DOCUSATE SODIUM 100 MG PO CAPS
100.0000 mg | ORAL_CAPSULE | Freq: Every day | ORAL | Status: DC
  Administered 2020-05-07: 100 mg via ORAL
  Filled 2020-05-06: qty 1

## 2020-05-06 MED ORDER — ACCU-CHEK SOFTCLIX LANCETS MISC: 100.0000 | Freq: Four times a day (QID) | 12 refills | Status: DC

## 2020-05-06 MED ORDER — ASPIRIN EC 81 MG PO TBEC
162.0000 mg | DELAYED_RELEASE_TABLET | Freq: Every day | ORAL | Status: DC
  Administered 2020-05-06 – 2020-05-07 (×2): 162 mg via ORAL
  Filled 2020-05-06 (×2): qty 2

## 2020-05-06 MED ORDER — INSULIN ASPART 100 UNIT/ML ~~LOC~~ SOLN
0.0000 [IU] | Freq: Three times a day (TID) | SUBCUTANEOUS | Status: DC
  Administered 2020-05-06 (×2): 6 [IU] via SUBCUTANEOUS
  Administered 2020-05-07: 4 [IU] via SUBCUTANEOUS

## 2020-05-06 MED ORDER — INSULIN REGULAR(HUMAN) IN NACL 100-0.9 UT/100ML-% IV SOLN
INTRAVENOUS | Status: DC
  Filled 2020-05-06: qty 100

## 2020-05-06 MED ORDER — CLOTRIMAZOLE-BETAMETHASONE 1-0.05 % EX CREA: 1.0000 "application " | TOPICAL_CREAM | Freq: Two times a day (BID) | CUTANEOUS | 0 refills | Status: DC

## 2020-05-06 MED ORDER — FERROUS SULFATE 325 (65 FE) MG PO TABS
325.0000 mg | ORAL_TABLET | Freq: Two times a day (BID) | ORAL | Status: DC
  Administered 2020-05-06 – 2020-05-07 (×2): 325 mg via ORAL
  Filled 2020-05-06 (×2): qty 1

## 2020-05-06 MED ORDER — TRUE METRIX BLOOD GLUCOSE TEST VI STRP: ORAL_STRIP | 3 refills | Status: DC

## 2020-05-06 MED ORDER — INSULIN ASPART 100 UNIT/ML ~~LOC~~ SOLN
0.0000 [IU] | Freq: Three times a day (TID) | SUBCUTANEOUS | Status: DC
  Administered 2020-05-06: 10 [IU] via SUBCUTANEOUS
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
## 2020-05-06 NOTE — Progress Notes (Signed)
Per Dr. [name] post conversation with diabetes coordinator d/c insulin gtt  and try for glycemic control with SSI & NPH

## 2020-05-06 NOTE — Progress Notes (Signed)
PRENATAL VISIT NOTE  Subjective:  Joann Meyers is a 34 y.o. G1P0000 at [redacted]w[redacted]d being seen today for ongoing prenatal care.  She is currently monitored for the following issues for this high-risk pregnancy and has History of stroke; Type 2 diabetes mellitus without complication, without long-term current use of insulin ([place]); Microcytic anemia; Chest pain; Non-compliance; Obesity (BMI 30-39.9); Left-sided weakness; TIA (transient ischemic attack); Thyroid nodule; Supervision of high risk pregnancy, antepartum; Preexisting diabetes complicating pregnancy, antepartum; Seizures ([place]); Headache in pregnancy, antepartum, second trimester; 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[place]); Hypertension during pregnancy, antepartum; and Obesity in pregnancy on their problem list.  Patient doing well with no acute concerns today. She reports fatigue.  Contractions: Not present. Vag. Bleeding: None.  Movement: Present. Denies leaking of fluid.   Pt has not been taking her blood sugars as she has had no testing strips.  Strips have been reordered.  Per pt her last fasting was 95 , 3 days ago, and her postprandials are 130-145. Fetal echo was WNL.  Pt still notes mild nausea The following portions of the patient's history were reviewed and updated as appropriate: allergies, current medications, past family history, past medical history, past social history, past surgical history and problem list. Problem list updated.  Objective:   Vitals:   05/06/20 0900  BP: 136/80  Pulse: 96  Weight: 198 lb (89.8 kg)    Fetal Status: Fetal Heart Rate (bpm): 145 Fundal Height: 23 cm Movement: Present     General:  Alert, oriented and cooperative. Patient is in no acute distress.  Skin: Skin is warm and dry. No rash noted.   Cardiovascular: Normal heart rate noted  Respiratory: Normal respiratory effort, no problems with respiration noted  Abdomen: Soft, gravid, appropriate for gestational age.  Pain/Pressure: Present     Pelvic:  Cervical exam deferred        Extremities: Normal range of motion.  Edema: None  Mental Status:  Normal mood and affect. Normal behavior. Normal judgment and thought content.   Assessment and Plan:  Pregnancy: G1P0000 at [redacted]w[redacted]d  1. TIA (transient ischemic attack) Continue baby ASA at 162 mg daily  2. 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 Obesity (BMI 30-39.9)   4. Supervision of high risk pregnancy, antepartum  - Doxylamine-Pyridoxine (DICLEGIS) 10-10 MG TBEC; Take 2 tablets by mouth at bedtime. If symptoms persist, add one tablet in the morning and one in the afternoon  Dispense: 100 tablet; Refill: 5  5. Obesity in pregnancy   6. Headache in pregnancy, antepartum, second trimester   7. Non-compliance New rx for testing strips sent  8. Preexisting diabetes complicating pregnancy, antepartum Emphasized checking blood sugars, glucose sheets given, pt just had growth scan, results not on file yet, per pt baby was measuring small, follow scan ordered  Random blood sugar in office was 300, pt is being sent to MAU for labs and possible admission for blood sugar control  9. Type 2 diabetes mellitus without complication, without long-term current use of insulin ([place]) Will get ophthamology for evaluation of eyes - Ambulatory referral to Ophthalmology - glucose blood test strip; Use as instructed  Dispense: 100 each; Refill: 12 - TRUE METRIX BLOOD GLUCOSE TEST test strip; Check fasting and after meal blood sugars  Dispense: 100 each; Refill: 3  10. Pre-existing hypertension during pregnancy, antepartum, unspecified pre-existing hypertension type BP 136/80, pt stable on no meds  11. Rash of both hands Appear to be slightly erythematous, possible fungal in nature, rx for clortrimazole  while waiting for dermatology referral - Ambulatory referral to Dermatology  Preterm labor symptoms and general obstetric precautions including but not limited to vaginal bleeding, contractions,  leaking of fluid and fetal movement were reviewed in detail with the patient.  Please refer to After Visit Summary for other counseling recommendations.   Return in about 2 weeks (around 05/20/2020) for [facility], in person.   Lynnda Shields, MD

## 2020-05-06 NOTE — Progress Notes (Signed)
Diabetic coordinator paged for coordination of hyperglycemia orders.   Pharmacy called to find out about home meds. Pt states she is unsure which meds she has. RN asked Pt to call home and find out about home meds.

## 2020-05-06 NOTE — Patient Instructions (Signed)
Gestational Diabetes Mellitus, Self Care When you have gestational diabetes (gestational diabetes mellitus), you must make sure your blood sugar (glucose) stays in a healthy range. You can do this with:  Nutrition.  Exercise.  Lifestyle changes.  Medicines or insulin, if needed.  Support from your doctors and others. If you get treated for this condition, it may not hurt you or your unborn baby (fetus). If you do not get treated for this condition, it may cause problems that can hurt you or your unborn baby. If you get gestational diabetes, you are:  More likely to get it if you get pregnant again.  More likely to develop type 2 diabetes in the future. How to stay aware of blood sugar   Check your blood sugar every day while you are pregnant. Check it as often as told.  Call your doctor if your blood sugar is above your goal numbers for two tests in a row. Your doctor will set personal treatment goals for you. Generally, you should have these blood sugar levels:  Before meals, or after not eating for a long time (fasting or preprandial): at or below 95 mg/dL (5.3 mmol/L).  After meals (postprandial): ? One hour after a meal: at or below 140 mg/dL (7.8 mmol/L). ? Two hours after a meal: at or below 120 mg/dL (6.7 mmol/L).  A1c (hemoglobin A1c) level: 6-6.5%. How to manage high and low blood sugar Signs of high blood sugar High blood sugar is called hyperglycemia. Know the early signs of high blood sugar. Signs may include:  Feeling: ? Thirsty. ? Hungry. ? Very tired.  Needing to pee (urinate) more than usual.  Blurry vision. Signs of low blood sugar Low blood sugar is called hypoglycemia. This is when blood sugar is at or below 70 mg/dL (3.9 mmol/L). Signs may include:  Feeling: ? Hungry. ? Worried or nervous (anxious). ? Sweaty and clammy. ? Confused. ? Dizzy. ? Sleepy. ? Sick to your stomach (nauseous).  Having: ? A fast heartbeat. ? A headache. ? A change  in your vision. ? Tingling or no feeling (numbness) around your mouth, lips, or tongue. ? Jerky movements that you cannot control (seizure).  Having trouble with: ? Moving (coordination). ? Sleeping. ? Passing out (fainting). ? Getting upset easily (irritability). Treating low blood sugar To treat low blood sugar, eat or drink something sugary right away. If you can think clearly and swallow safely, follow the 15:15 rule:  Take 15 grams of a fast-acting carb (carbohydrate). Talk with your doctor about how much you should take.  Some fast-acting carbs are: ? Sugar tablets (glucose pills). Take 3-4 glucose pills. ? 6-8 pieces of hard candy. ? 4-6 oz (120-150 mL) of fruit juice. ? 4-6 oz (120-150 mL) of regular (not diet) soda. ? 1 Tbsp (15 mL) honey or sugar.  Check your blood sugar 15 minutes after you take the carb.  If your blood sugar is still at or below 70 mg/dL (3.9 mmol/L), take 15 grams of a carb again.  If your blood sugar does not go above 70 mg/dL (3.9 mmol/L) after 3 tries, get help right away.  After your blood sugar goes back to normal, eat a meal or a snack within 1 hour. Treating very low blood sugar If your blood sugar is at or below 54 mg/dL (3 mmol/L), you have very low blood sugar (severe hypoglycemia). This is an emergency. Do not wait to see if the symptoms will go away. Get medical help right  away. Call your local emergency services (911 in the [place] If you have very low blood sugar and you cannot eat or drink, you may need a glucagon shot (injection). A family member or friend should learn how to check your blood sugar and how to give you a glucagon shot. Ask your doctor if you need to have a glucagon shot kit at home. Follow these instructions at home: Medicine  Take your insulin and diabetes medicines as told.  If your doctor says you should take more or less insulin or medicines, do this exactly as told.  Do not run out of insulin or  medicines. Food   Make healthy food choices. These include: ? Chicken, fish, egg whites, and beans. ? Oats, whole wheat, bulgur, brown rice, quinoa, and millet. ? Fresh fruits and vegetables. ? Low-fat dairy products. ? Nuts, avocado, olive oil, and canola oil.  Meet with a food specialist (dietitian). He or she can help you make an eating plan that is right for you.  Follow instructions from your doctor about what you cannot eat or drink.  Drink enough fluid to keep your pee (urine) pale yellow.  Eat healthy snacks between healthy meals.  Keep track of carbs that you eat. Do this by reading food labels and learning food serving sizes.  Follow your sick day plan when you cannot eat or drink normally. Make this plan with your doctor so it is ready to use. Activity  Exercise for 30 or more minutes a day, or as much as your doctor recommends.  Talk with your doctor before you start a new exercise or activity. Your doctor may need to tell you to change: ? How much insulin or medicines you take. ? How much food you eat. Lifestyle  Do not drink alcohol.  Do not use any tobacco products. These include cigarettes, chewing tobacco, and e-cigarettes. If you need help quitting, ask your doctor.  Learn how to deal with stress. If you need help with this, ask your doctor. Body care  Stay up to date with your shots (immunizations).  Brush your teeth and gums two times a day. Floss one or more times a day.  Go to the dentist one or more times every 6 months.  Stay at a healthy weight while you are pregnant. General instructions  Take over-the-counter and prescription medicines only as told by your doctor.  Ask your doctor about risks of high blood pressure in pregnancy (preeclampsia and eclampsia).  Share your diabetes care plan with: ? Your work or school. ? People you live with.  Check your pee for ketones: ? When you are sick. ? As told by your doctor.  Carry a card or  wear jewelry that says you have diabetes.  Keep all follow-up visits as told by your doctor. This is important. Care after giving birth  Have your blood sugar checked 4-12 weeks after you give birth.  Get checked for diabetes one or more times during 3 years. Questions to ask your doctor  Do I need to meet with a diabetes educator?  Where can I find a support group for people with gestational diabetes? Where to find more information To learn more about diabetes, visit:  American Diabetes Association: www.diabetes.org  Centers for Disease Control and Prevention (CDC): www.cdc.gov Summary  Check your blood sugar (glucose) every day while you are pregnant. Check it as often as told.  Take your insulin and diabetes medicines as told.  Keep all follow-up visits as   told by your doctor. This is important.  Have your blood sugar checked 4-12 weeks after you give birth. This information is not intended to replace advice given to you by your health care provider. Make sure you discuss any questions you have with your health care provider. Document Revised: 03/25/2018 Document Reviewed: 11/05/2015 Elsevier Patient Education  2020 Elsevier Inc.  

## 2020-05-06 NOTE — MAU Note (Signed)
Sent from office, due to elevated blood sugar.  Pt had eaten at 0300 when she got off of work.  Denies any pain, bleeding or LOF.  No complaints.

## 2020-05-06 NOTE — Progress Notes (Signed)
Pt is not logging in her Glucose levels, has not checked her sugars in 3 days.

## 2020-05-06 NOTE — Progress Notes (Signed)
Inpatient Diabetes Program Recommendations  AACE/ADA: New Consensus Statement on Inpatient Glycemic Control (2015)  Target Ranges:  Prepandial:   less than 140 mg/dL      Peak postprandial:   less than 180 mg/dL (1-2 hours)      Critically ill patients:  140 - 180 mg/dL   Lab Results  Component Value Date   GLUCAP 336 (H) 05/06/2020   HGBA1C 6.5 (H) 05/06/2020    Review of Glycemic Control  Diabetes history: Gestational  Outpatient Diabetes medications: Glyburide 2.5 mg at supper Current orders for Inpatient glycemic control: NPH 12 units in am and HS, Novolog correction scale 0-14 units fasting, 2 hour postprandial  Inpatient Diabetes Program Recommendations:   Spoke with Dr. [name] in regards to patient starting on insulin.   Recommend NPH 12 units in the am and at HS, (per 23 weeks and weight of 90.4 kg), Novolog 0-14 units fasting, and 2 hr. Postprandial, checking CBGs fasting and 2 hour postprandial.  Will continue to monitor blood sugars while in the hospital.  Harvel Ricks RN BSN CDE Diabetes Coordinator Pager: [phone]  8am-5pm

## 2020-05-07 LAB — GLUCOSE, CAPILLARY
Glucose-Capillary: 111 mg/dL — ABNORMAL HIGH (ref 70–99)
Glucose-Capillary: 110 mg/dL — ABNORMAL HIGH (ref 70–99)
Glucose-Capillary: 217 mg/dL — ABNORMAL HIGH (ref 70–99)

## 2020-05-07 LAB — URINALYSIS, ROUTINE W REFLEX MICROSCOPIC
Bilirubin Urine: NEGATIVE
Glucose, UA: >=500 mg/dL — AB
Hgb urine dipstick: NEGATIVE
Ketones, ur: NEGATIVE mg/dL
Leukocytes,Ua: NEGATIVE
Nitrite: NEGATIVE
Protein, ur: NEGATIVE mg/dL
Specific Gravity, Urine: 1.021 (ref 1.005–1.030)
pH: 6 (ref 5.0–8.0)

## 2020-05-07 LAB — BASIC METABOLIC PANEL
Anion gap: 7 (ref 5–15)
BUN: 8 mg/dL (ref 6–20)
CO2: 22 mmol/L (ref 22–32)
Calcium: 8.5 mg/dL — ABNORMAL LOW (ref 8.9–10.3)
Chloride: 107 mmol/L (ref 98–111)
Creatinine, Ser: 0.35 mg/dL — ABNORMAL LOW (ref 0.44–1.00)
GFR calc Af Amer: >60 mL/min (ref >60)
GFR calc non Af Amer: >60 mL/min (ref >60)
Glucose, Bld: 110 mg/dL — ABNORMAL HIGH (ref 70–99)
Potassium: 3.4 mmol/L — ABNORMAL LOW (ref 3.5–5.1)
Sodium: 136 mmol/L (ref 135–145)

## 2020-05-07 MED ORDER — INSULIN ASPART 100 UNIT/ML ~~LOC~~ SOLN: 5.0000 [IU] | Freq: Three times a day (TID) | SUBCUTANEOUS | 11 refills | Status: DC

## 2020-05-07 MED ORDER — BLOOD GLUCOSE METER KIT: PACK | 0 refills | Status: DC

## 2020-05-07 MED ORDER — ACCU-CHEK GUIDE W/DEVICE KIT: 1.0000 | PACK | Freq: Once | 0 refills | Status: AC

## 2020-05-07 MED ORDER — INSULIN STARTER KIT- PEN NEEDLES (ENGLISH)
1.0000 | Freq: Once | Status: DC
  Filled 2020-05-07: qty 1

## 2020-05-07 MED ORDER — INSULIN NPH (HUMAN) (ISOPHANE) 100 UNIT/ML ~~LOC~~ SUSP: 12.0000 [IU] | Freq: Two times a day (BID) | SUBCUTANEOUS | 11 refills | Status: DC

## 2020-05-07 MED ORDER — INSULIN STARTER KIT- PEN NEEDLES (ENGLISH): 1.0000 | Freq: Once | 0 refills | Status: AC
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
## 2020-05-07 NOTE — Progress Notes (Signed)
CSW received phone call from Diabetes Coordinator requesting assistance with getting prescriptions and supplies for patient for discharge. CSW spoke to Nicole Kindred with [facility] who stated they could fill these prescriptions and gather supplies and get them to patient prior to discharge. CSW spoke with Dr. [name] who was agreeable to putting in prescriptions.  No further needs, at this time. Please reconsult CSW if needs arise.  Elijio Miles, LCSW [facility] [employer] [phone]

## 2020-05-07 NOTE — Discharge Instructions (Signed)
Glucose Products:  ReliOnT glucose products raise low blood sugar fast. Tablets are free of fat, caffeine, sodium and gluten. They are portable and easy to carry, making it easier for people with diabetes to BE PREPARED for lows.  Glucose Tablets Available in 6 flavors . 10 ct...................................... $1.00 . 50 ct...................................... $3.98 Glucose Shot..................................$1.48 Glucose Gel....................................$3.44  Alcohol Swabs Alcohol swabs are used to sterilize your injection site. All of our swabs are individually wrapped for maximum safety, convenience and moisture retention. ReliOnT Alcohol Swabs . 100 ct Swabs..............................$1.00 . 400 ct Swabs..............................$3.74  Lancets ReliOnT offers three lancet options conveniently designed to work with almost every lancing device. Each features a protective disk, which guarantees sterility before testing. ReliOnT Lancets . 100 ct Lancets $1.56 . 200 ct Lancets $2.64 Available in Ultra-Thin, Thin & Micro-Thin ReliOnT 2-IN-1 Lancing Device . 50 ct Lancets..................................... $3.44 Available in 30 gauge and 25 gauge ReliOnT Lancing Device....................$5.84  Blood Glucose Monitors ReliOnT offers a full range of blood glucose testing options to provide an accurate, affordable system that meets each person's unique needs and preferences. Prime Meter....................................... $9.00 Prime Test Strips . 25 test strips.................................... $5.00 . 50 test strips.................................... $9.00 . 100 test strips.................................$17.88 Premier BLU Meter  ............  $18.98 Premier Voice Meter  .............  $14.98 Premier Test Strips . 50 test strips.................................... $9.00 . 100 test strips.................................$17.88 Premier [employer]   ............  $19.44 Kit includes: . 50 test strips . 10 lancets . Lancing device . Carry case  Ketone Test Strips . 50 test strips  ................  $6.64  Human Insulin  Novolin/ReliOnT (recombinant DNA origin) is manufactured for [employer] by [employer] Insulin* with Vial..........$24.88 Available in N, R, 70/30 Novolin/ReliOnT Insulin Pens*  .....  $42.88 Available in N, R, 70/30  Insulin Delivery ReliOnT syringes and pen needles provide precision technology, comfort and accuracy in insulin delivery at affordable prices. ReliOnT Pen Needles* . 50 ct....................................................$9.00 Available in 25m, 684m 33m3m 57m4mliOnT Insulin Syringes* . 100 ct ............ $12.58 Available in 29G, 30G & 31G (3/10cc, 1/2cc & 1cc units)

## 2020-05-07 NOTE — Progress Notes (Signed)
MD notified of pts 2 hour PP blood sugar of 250 that was covered with six units of insulin. MD also made aware of bedtime blood sugar of 192. RN instructed to retake blood glucose at 0300.

## 2020-05-07 NOTE — Progress Notes (Signed)
Noted that patient's blood sugars have not been at goal in the hospital.  Recommend home regimen of insulin: NPH 12 units in am and HS Novolog 5 units TID with meals  Glucose meter, strips, and lancets kit (order # [id number])  Follow up after discharge with OB for glucose control and insulin adjustments.   Harvel Ricks RN BSN CDE Diabetes Coordinator Pager: [phone]  8am-5pm

## 2020-05-17 ENCOUNTER — Inpatient Hospital Stay (HOSPITAL_COMMUNITY)
Admission: AD | Admit: 2020-05-17 | Discharge: 2020-05-17 | Disposition: A | Discharge status: Home / Self Care | Payer: Medicaid Other | Attending: Obstetrics & Gynecology | Admitting: Obstetrics & Gynecology

## 2020-05-17 ENCOUNTER — Encounter (HOSPITAL_COMMUNITY): Payer: Self-pay | Admitting: Obstetrics & Gynecology

## 2020-05-17 DIAGNOSIS — Z8673 Personal history of transient ischemic attack (TIA), and cerebral infarction without residual deficits: Secondary | ICD-10-CM | POA: Diagnosis not present

## 2020-05-17 DIAGNOSIS — Z3A24 24 weeks gestation of pregnancy: Secondary | ICD-10-CM | POA: Insufficient documentation

## 2020-05-17 DIAGNOSIS — Z3689 Encounter for other specified antenatal screening: Secondary | ICD-10-CM

## 2020-05-17 DIAGNOSIS — O24112 Pre-existing diabetes mellitus, type 2, in pregnancy, second trimester: Secondary | ICD-10-CM | POA: Diagnosis not present

## 2020-05-17 DIAGNOSIS — O368120 Decreased fetal movements, second trimester, not applicable or unspecified: Secondary | ICD-10-CM | POA: Insufficient documentation

## 2020-05-17 DIAGNOSIS — Z79899 Other long term (current) drug therapy: Secondary | ICD-10-CM | POA: Insufficient documentation

## 2020-05-17 DIAGNOSIS — Z7982 Long term (current) use of aspirin: Secondary | ICD-10-CM | POA: Insufficient documentation

## 2020-05-17 DIAGNOSIS — Z794 Long term (current) use of insulin: Secondary | ICD-10-CM | POA: Diagnosis not present
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
## 2020-05-17 NOTE — MAU Note (Signed)
..  Joann Meyers is a 34 y.o. at [redacted]w[redacted]d here in MAU reporting: No fetal movements since 05/13/20. Denies Vaginal bleeding, LOF, or CTX.   Onset of complaint: Thursday 05/13/20 Pain score: 0/10 Vitals:   05/17/20 0201  BP: 124/74  Pulse: 102  Resp: 17  Temp: 98.7 F (37.1 C)     FHT: Monitors applied. 135 FHR. Assessing.

## 2020-05-17 NOTE — Discharge Instructions (Signed)
How a Baby Grows During Pregnancy  Pregnancy begins when a female's sperm enters a female's egg (fertilization). Fertilization usually happens in one of the tubes (fallopian tubes) that connect the ovaries to the womb (uterus). The fertilized egg moves down the fallopian tube to the uterus. Once it reaches the uterus, it implants into the lining of the uterus and begins to grow. For the first 10 weeks, the fertilized egg is called an embryo. After 10 weeks, it is called a fetus. As the fetus continues to grow, it receives oxygen and nutrients through tissue (placenta) that grows to support the developing baby. The placenta is the life support system for the baby. It provides oxygen and nutrition and removes waste. Learning as much as you can about your pregnancy and how your baby is developing can help you enjoy the experience. It can also make you aware of when there might be a problem and when to ask questions. How long does a typical pregnancy last? A pregnancy usually lasts 280 days, or about 40 weeks. Pregnancy is divided into three periods of growth, also called trimesters:  First trimester: 0-12 weeks.  Second trimester: 13-27 weeks.  Third trimester: 28-40 weeks. The day when your baby is ready to be born (full term) is your estimated date of delivery. How does my baby develop month by month? First month  The fertilized egg attaches to the inside of the uterus.  Some cells will form the placenta. Others will form the fetus.  The arms, legs, brain, spinal cord, lungs, and heart begin to develop.  At the end of the first month, the heart begins to beat. Second month  The bones, inner ear, eyelids, hands, and feet form.  The genitals develop.  By the end of 8 weeks, all major organs are developing. Third month  All of the internal organs are forming.  Teeth develop below the gums.  Bones and muscles begin to grow. The spine can flex.  The skin is transparent.  Fingernails  and toenails begin to form.  Arms and legs continue to grow longer, and hands and feet develop.  The fetus is about 3 inches (7.6 cm) long. Fourth month  The placenta is completely formed.  The external sex organs, neck, outer ear, eyebrows, eyelids, and fingernails are formed.  The fetus can hear, swallow, and move its arms and legs.  The kidneys begin to produce urine.  The skin is covered with a white, waxy coating (vernix) and very fine hair (lanugo). Fifth month  The fetus moves around more and can be felt for the first time (quickening).  The fetus starts to sleep and wake up and may begin to suck its finger.  The nails grow to the end of the fingers.  The organ in the digestive system that makes bile (gallbladder) functions and helps to digest nutrients.  If your baby is a girl, eggs are present in her ovaries. If your baby is a boy, testicles start to move down into his scrotum. Sixth month  The lungs are formed.  The eyes open. The brain continues to develop.  Your baby has fingerprints and toe prints. Your baby's hair grows thicker.  At the end of the second trimester, the fetus is about 9 inches (22.9 cm) long. Seventh month  The fetus kicks and stretches.  The eyes are developed enough to sense changes in light.  The hands can make a grasping motion.  The fetus responds to sound. Eighth month  All  organs and body systems are fully developed and functioning.  Bones harden, and taste buds develop. The fetus may hiccup.  Certain areas of the brain are still developing. The skull remains soft. Ninth month  The fetus gains about  lb (0.23 kg) each week.  The lungs are fully developed.  Patterns of sleep develop.  The fetus's head typically moves into a head-down position (vertex) in the uterus to prepare for birth.  The fetus weighs 6-9 lb (2.72-4.08 kg) and is 19-20 inches (48.26-50.8 cm) long. What can I do to have a healthy pregnancy and help  my baby develop? General instructions  Take prenatal vitamins as directed by your health care provider. These include vitamins such as folic acid, iron, calcium, and vitamin D. They are important for healthy development.  Take medicines only as directed by your health care provider. Read labels and ask a pharmacist or your health care provider whether over-the-counter medicines, supplements, and prescription drugs are safe to take during pregnancy.  Keep all follow-up visits as directed by your health care provider. This is important. Follow-up visits include prenatal care and screening tests. How do I know if my baby is developing well? At each prenatal visit, your health care provider will do several different tests to check on your health and keep track of your baby's development. These include:  Fundal height and position. ? Your health care provider will measure your growing belly from your pubic bone to the top of the uterus using a tape measure. ? Your health care provider will also feel your belly to determine your baby's position.  Heartbeat. ? An ultrasound in the first trimester can confirm pregnancy and show a heartbeat, depending on how far along you are. ? Your health care provider will check your baby's heart rate at every prenatal visit.  Second trimester ultrasound. ? This ultrasound checks your baby's development. It also may show your baby's gender. What should I do if I have concerns about my baby's development? Always talk with your health care provider about any concerns that you may have about your pregnancy and your baby. Summary  A pregnancy usually lasts 280 days, or about 40 weeks. Pregnancy is divided into three periods of growth, also called trimesters.  Your health care provider will monitor your baby's growth and development throughout your pregnancy.  Follow your health care provider's recommendations about taking prenatal vitamins and medicines during  your pregnancy.  Talk with your health care provider if you have any concerns about your pregnancy or your developing baby. This information is not intended to replace advice given to you by your health care provider. Make sure you discuss any questions you have with your health care provider. Document Revised: 01/23/2019 Document Reviewed: 08/15/2017 Elsevier Patient Education  2020 [employer].

## 2020-05-20 ENCOUNTER — Other Ambulatory Visit: Payer: Self-pay

## 2020-05-20 ENCOUNTER — Ambulatory Visit (INDEPENDENT_AMBULATORY_CARE_PROVIDER_SITE_OTHER): Payer: Medicaid Other | Admitting: Obstetrics and Gynecology

## 2020-05-20 ENCOUNTER — Encounter: Payer: Self-pay | Admitting: Advanced Practice Midwife

## 2020-05-20 VITALS — BP 128/73 | HR 84 | Wt 198.0 lb

## 2020-05-20 DIAGNOSIS — G459 Transient cerebral ischemic attack, unspecified: Secondary | ICD-10-CM

## 2020-05-20 DIAGNOSIS — E119 Type 2 diabetes mellitus without complications: Secondary | ICD-10-CM

## 2020-05-20 DIAGNOSIS — E669 Obesity, unspecified: Secondary | ICD-10-CM

## 2020-05-20 DIAGNOSIS — O365990 Maternal care for other known or suspected poor fetal growth, unspecified trimester, not applicable or unspecified: Secondary | ICD-10-CM | POA: Insufficient documentation

## 2020-05-20 DIAGNOSIS — Z3A25 25 weeks gestation of pregnancy: Secondary | ICD-10-CM

## 2020-05-20 DIAGNOSIS — O10019 Pre-existing essential hypertension complicating pregnancy, unspecified trimester: Secondary | ICD-10-CM

## 2020-05-20 DIAGNOSIS — O0990 Supervision of high risk pregnancy, unspecified, unspecified trimester: Secondary | ICD-10-CM

## 2020-05-20 DIAGNOSIS — I7771 Dissection of carotid artery: Secondary | ICD-10-CM

## 2020-05-20 DIAGNOSIS — Z8673 Personal history of transient ischemic attack (TIA), and cerebral infarction without residual deficits: Secondary | ICD-10-CM

## 2020-05-20 MED ORDER — PRENATABS RX 29-1 MG PO TABS: 1.0000 | ORAL_TABLET | Freq: Every day | ORAL | 11 refills | Status: DC

## 2020-05-20 NOTE — Progress Notes (Signed)
   PRENATAL VISIT NOTE  Subjective:  Joann Meyers is a 34 y.o. G1P0000 at [redacted]w[redacted]d being seen today for ongoing prenatal care.  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  Patient doing well with no acute concerns today. She reports no complaints.  Contractions: Not present. Vag. Bleeding: None.  Movement: Present. Denies leaking of fluid.    FBS: 93-99  PPBS: 105-143 The following portions of the patient's history were reviewed and updated as appropriate: allergies, current medications, past family history, past medical history, past social history, past surgical history and problem list. Problem list updated.  Objective:   Vitals:   05/20/20 0926  BP: 128/73  Pulse: 84  Weight: 198 lb (89.8 kg)    Fetal Status: Fetal Heart Rate (bpm): 140   Movement: Present     General:  Alert, oriented and cooperative. Patient is in no acute distress.  Skin: Skin is warm and dry. No rash noted.   Cardiovascular: Normal heart rate noted  Respiratory: Normal respiratory effort, no problems with respiration noted  Abdomen: Soft, gravid, appropriate for gestational age.  Pain/Pressure: Present     Pelvic: Cervical exam deferred        Extremities: Normal range of motion.  Edema: Trace    Mental Status:  Normal mood and affect. Normal behavior. Normal judgment and thought content.   Assessment and Plan:  Pregnancy: G1P0000 at [redacted]w[redacted]d  1. TIA (transient ischemic attack) Pt on baby aspirin  2. 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 Pre-existing essential hypertension during pregnancy, antepartum  4. Type 2 diabetes mellitus without complication, without long-term current use of insulin ([facility]) Continue current insulin regimen NPH 12 units in AM and HS Novolog 5 units TID with meals Pt strongly advised to use babyscripts and to bring in glucose log each visit  5. History of stroke   6. Supervision of high risk pregnancy, antepartum 3rd trimester labs next visit  7. Obesity (BMI 30-39.9)   8. [redacted] weeks gestation of pregnancy   Preterm labor symptoms and general obstetric precautions including but not limited to vaginal bleeding, contractions, leaking of fluid and fetal movement were reviewed in detail with the patient.  Please refer to After Visit Summary for other counseling recommendations.   Return in about 2 weeks (around 06/03/2020) for [facility], in person, 26-28 week labs next visit, 3rd trim labs.   Lynnda Shields, MD

## 2020-05-20 NOTE — Patient Instructions (Signed)
Second Trimester of Pregnancy  The second trimester is from week 14 through week 27 (month 4 through 6). This is often the time in pregnancy that you feel your best. Often times, morning sickness has lessened or quit. You may have more energy, and you may get hungry more often. Your unborn baby is growing rapidly. At the end of the sixth month, he or she is about 9 inches long and weighs about 1 pounds. You will likely feel the baby move between 18 and 20 weeks of pregnancy. Follow these instructions at home: Medicines  Take over-the-counter and prescription medicines only as told by your doctor. Some medicines are safe and some medicines are not safe during pregnancy.  Take a prenatal vitamin that contains at least 600 micrograms (mcg) of folic acid.  If you have trouble pooping (constipation), take medicine that will make your stool soft (stool softener) if your doctor approves. Eating and drinking   Eat regular, healthy meals.  Avoid raw meat and uncooked cheese.  If you get low calcium from the food you eat, talk to your doctor about taking a daily calcium supplement.  Avoid foods that are high in fat and sugars, such as fried and sweet foods.  If you feel sick to your stomach (nauseous) or throw up (vomit): ? Eat 4 or 5 small meals a day instead of 3 large meals. ? Try eating a few soda crackers. ? Drink liquids between meals instead of during meals.  To prevent constipation: ? Eat foods that are high in fiber, like fresh fruits and vegetables, whole grains, and beans. ? Drink enough fluids to keep your pee (urine) clear or pale yellow. Activity  Exercise only as told by your doctor. Stop exercising if you start to have cramps.  Do not exercise if it is too hot, too humid, or if you are in a place of great height (high altitude).  Avoid heavy lifting.  Wear low-heeled shoes. Sit and stand up straight.  You can continue to have sex unless your doctor tells you not  to. Relieving pain and discomfort  Wear a good support bra if your breasts are tender.  Take warm water baths (sitz baths) to soothe pain or discomfort caused by hemorrhoids. Use hemorrhoid cream if your doctor approves.  Rest with your legs raised if you have leg cramps or low back pain.  If you develop puffy, bulging veins (varicose veins) in your legs: ? Wear support hose or compression stockings as told by your doctor. ? Raise (elevate) your feet for 15 minutes, 3-4 times a day. ? Limit salt in your food. Prenatal care  Write down your questions. Take them to your prenatal visits.  Keep all your prenatal visits as told by your doctor. This is important. Safety  Wear your seat belt when driving.  Make a list of emergency phone numbers, including numbers for family, friends, the hospital, and police and fire departments. General instructions  Ask your doctor about the right foods to eat or for help finding a counselor, if you need these services.  Ask your doctor about local prenatal classes. Begin classes before month 6 of your pregnancy.  Do not use hot tubs, steam rooms, or saunas.  Do not douche or use tampons or scented sanitary pads.  Do not cross your legs for long periods of time.  Visit your dentist if you have not done so. Use a soft toothbrush to brush your teeth. Floss gently.  Avoid all smoking, herbs,   and alcohol. Avoid drugs that are not approved by your doctor.  Do not use any products that contain nicotine or tobacco, such as cigarettes and e-cigarettes. If you need help quitting, ask your doctor.  Avoid cat litter boxes and soil used by cats. These carry germs that can cause birth defects in the baby and can cause a loss of your baby (miscarriage) or stillbirth. Contact a doctor if:  You have mild cramps or pressure in your lower belly.  You have pain when you pee (urinate).  You have bad smelling fluid coming from your vagina.  You continue to  feel sick to your stomach (nauseous), throw up (vomit), or have watery poop (diarrhea).  You have a nagging pain in your belly area.  You feel dizzy. Get help right away if:  You have a fever.  You are leaking fluid from your vagina.  You have spotting or bleeding from your vagina.  You have severe belly cramping or pain.  You lose or gain weight rapidly.  You have trouble catching your breath and have chest pain.  You notice sudden or extreme puffiness (swelling) of your face, hands, ankles, feet, or legs.  You have not felt the baby move in over an hour.  You have severe headaches that do not go away when you take medicine.  You have trouble seeing. Summary  The second trimester is from week 14 through week 27 (months 4 through 6). This is often the time in pregnancy that you feel your best.  To take care of yourself and your unborn baby, you will need to eat healthy meals, take medicines only if your doctor tells you to do so, and do activities that are safe for you and your baby.  Call your doctor if you get sick or if you notice anything unusual about your pregnancy. Also, call your doctor if you need help with the right food to eat, or if you want to know what activities are safe for you. This information is not intended to replace advice given to you by your health care provider. Make sure you discuss any questions you have with your health care provider. Document Revised: 01/24/2019 Document Reviewed: 11/07/2016 Elsevier Patient Education  [place]. Type 1 or Type 2 Diabetes Mellitus During Pregnancy, Self Care When you have type 1 or type 2 diabetes (diabetes mellitus), you must keep your blood sugar (glucose) in a healthy range. You can do this with:  Nutrition.  Exercise.  Lifestyle changes.  Insulin or medicines, if needed.  Support from your doctors and others. If diabetes is treated, it is unlikely to cause problems for the mother or baby. If it  is not treated, it may cause problems that can be harmful to the mother and baby. How to stay aware of blood sugar   Check your blood sugar every day, as often as told.  Call your doctor if your blood sugar is above your goal numbers for 2 tests in a row.  Have your A1c (hemoglobin A1c) level checked at least two times a year. Have it checked more often if your doctor tells you to do that. Your doctor will set personal treatment goals for you. In general, you should have these blood sugar levels:  After not eating for a long time (fasting): 95 mg/dL (5.3 mmol/L).  After meals (postprandial): ? One hour after a meal: at or below 140 mg/dL (7.8 mmol/L). ? Two hours after a meal: at or below 120  mg/dL (6.7 mmol/L).  A1c level: 6-6.5%. How to manage high and low blood sugar Signs of high blood sugar High blood sugar is called hyperglycemia. Know the early signs of high blood sugar. Signs may include:  Feeling: ? Thirsty. ? Hungry. ? Very tired.  Needing to pee (urinate) more than usual.  Blurry vision. Signs of low blood sugar Low blood sugar is called hypoglycemia. This is when blood sugar is at or below 70 mg/dL (3.9 mmol/L). Signs may include:  Feeling: ? Hungry. ? Worried or nervous (anxious). ? Sweaty and clammy. ? Confused. ? Dizzy. ? Sleepy. ? Sick to your stomach (nauseous).  Having: ? A fast heartbeat. ? A headache. ? A change in your vision. ? Tingling or no feeling (numbness) around your mouth, lips, or tongue. ? Jerky movements that you cannot control (seizure).  Having trouble with: ? Moving (coordination). ? Sleeping. ? Passing out (fainting). ? Getting upset easily (irritability). Treating low blood sugar To treat low blood sugar, eat or drink something sugary right away. If you can think clearly and swallow safely, follow the 15:15 rule:  Take 15 grams of a fast-acting carb (carbohydrate). Some fast-acting carbs are: ? 1 tube of glucose gel. ? 3  sugar tablets (glucose pills). ? 6-8 pieces of hard candy. ? 4 oz (120 mL) of fruit juice. ? 4 oz (120 mL) of regular (not diet) soda.  Check your blood sugar 15 minutes after you take the carb.  If your blood sugar is still at or below 70 mg/dL (3.9 mmol/L), take 15 grams of a carb again.  If your blood sugar does not go above 70 mg/dL (3.9 mmol/L) after 3 tries, get help right away.  After your blood sugar goes back to normal, eat a meal or a snack within 1 hour. Treating very low blood sugar If your blood sugar is at or below 54 mg/dL (3 mmol/L), you have very low blood sugar (severe hypoglycemia). This is an emergency. Do not wait to see if the symptoms will go away. Get medical help right away. Call your local emergency services (911 in the [place] If you have very low blood sugar and you cannot eat or drink, you may need a glucagon shot (injection). A family member or friend should learn how to check your blood sugar and how to give you a glucagon shot. Ask your doctor if you need to have a glucagon shot kit at home. Follow these instructions at home: Medicine  Take insulin and diabetes medicines as told.  If your doctor says you should take more or less insulin and medicines, do this exactly as told.  Do not run out of insulin or medicines. Having diabetes can put you at risk for other long-term conditions. These include heart disease and kidney disease. Your doctor may prescribe medicines to prevent these problems. Food   Make healthy food choices. These include: ? Chicken, fish, egg whites, and beans. ? Oats, whole wheat, bulgur, brown rice, quinoa, and millet. ? Fresh fruits and vegetables. ? Low-fat dairy products. ? Nuts, avocado, olive oil, and canola oil.  Meet with a food specialist (dietitian). He or she can help you make an eating plan that is right for you.  Follow instructions from your doctor about what you cannot eat or drink.  Drink enough fluid to keep your  pee (urine) pale yellow.  Eat healthy snacks between healthy meals.  Keep track of the carbs you eat. Do this by reading food labels  and learning food serving sizes.  Follow your sick day plan when you cannot eat or drink normally. Make this plan with your doctor so it is ready to use. Activity  Exercise for 30 minutes or more a day during your pregnancy or as much as told by your doctor.  Talk with your doctor before you start a new exercise or activity. Your doctor may need to tell you to change: ? How much insulin or medicines you take. ? How much food you eat. Lifestyle  Do not drink alcohol.  Do not use any tobacco products, such as cigarettes, chewing tobacco, and e-cigarettes. If you need help quitting, ask your doctor.  Learn how to deal with stress. If you need help with this, ask your doctor. Body care  Stay up to date with your shots (immunizations).  Get an eye exam during your first trimester.  Check your skin and feet every day. Check for cuts, bruises, redness, blisters, or sores.  Get regular foot exams as told by your doctor.  Brush your teeth and gums two times a day. Floss one or more times a day.  Go to the dentist one or more times every 6 months.  Stay at a healthy weight during your pregnancy. General instructions  Take over-the-counter and prescription medicines only as told by your doctor.  Talk with your doctor about your risk for high blood pressure during pregnancy (preeclampsia or eclampsia).  Share your diabetes care plan with: ? Your work or school. ? People you live with.  Check your pee for ketones: ? When you are sick. ? As told by your doctor.  Carry a card or wear jewelry that says that you have diabetes.  Keep all follow-up visits with your doctor. This is important. Questions to ask your doctor  Do I need to meet with a diabetes educator?  Where can I find a support group for people with diabetes? Where to find more  information To learn more about diabetes, visit:  American Diabetes Association: www.diabetes.org  American Association of Diabetes Educators (AADE): www.diabeteseducator.org Summary  When you have type 1 or type 2 diabetes (diabetes mellitus), you must keep your blood sugar (glucose) in a healthy range.  Check your blood sugar every day, as often as told.  Take insulin and diabetes medicines as told.  Keep all follow-up visits as told by your doctor. This is important. This information is not intended to replace advice given to you by your health care provider. Make sure you discuss any questions you have with your health care provider. Document Revised: 01/21/2019 Document Reviewed: 11/05/2015 Elsevier Patient Education  2020 [employer].

## 2020-05-24 ENCOUNTER — Telehealth: Payer: Self-pay | Admitting: *Deleted

## 2020-05-24 DIAGNOSIS — O0990 Supervision of high risk pregnancy, unspecified, unspecified trimester: Secondary | ICD-10-CM

## 2020-05-24 DIAGNOSIS — O09899 Supervision of other high risk pregnancies, unspecified trimester: Secondary | ICD-10-CM

## 2020-05-24 DIAGNOSIS — R569 Unspecified convulsions: Secondary | ICD-10-CM

## 2020-05-24 DIAGNOSIS — O24319 Unspecified pre-existing diabetes mellitus in pregnancy, unspecified trimester: Secondary | ICD-10-CM

## 2020-05-24 NOTE — Telephone Encounter (Signed)
Received a phone call from [facility] regarding alerts from Saturday 7pm of 140/90 with about 5 retakes of same bp , no symptoms reported. Also Saturday 7:40 pm 140/83 with retakes, no syjmptoms reported. Also Sunday 157/98 with no symptoms reported.   I called [facility] mobile number  and left  a message we were calling to discuss her blood pressures and please call the office and ask to speak with a nurse.  I also called her home number and heard a message" your call cannot be completed at this time- try your call later". Will send MyChart message also. Will leave in inbox so we can call her again .  Mayla Biddy,RN

## 2020-05-25 NOTE — Telephone Encounter (Addendum)
Called pt at both numbers; VM left on mobile number, home number gave error message "call cannot be completed as dialed." Per [facility] review, no recent BP values. Will follow up with pt at next appt.

## 2020-05-27 ENCOUNTER — Other Ambulatory Visit: Payer: Self-pay | Admitting: *Deleted

## 2020-05-27 ENCOUNTER — Inpatient Hospital Stay (HOSPITAL_COMMUNITY)
Admission: AD | Admit: 2020-05-27 | Discharge: 2020-05-27 | Disposition: A | Discharge status: Home / Self Care | Payer: Medicaid Other | Attending: Obstetrics and Gynecology | Admitting: Obstetrics and Gynecology

## 2020-05-27 ENCOUNTER — Ambulatory Visit: Payer: Medicaid Other | Attending: Obstetrics

## 2020-05-27 ENCOUNTER — Other Ambulatory Visit: Payer: Self-pay

## 2020-05-27 ENCOUNTER — Ambulatory Visit: Payer: Medicaid Other | Admitting: *Deleted

## 2020-05-27 DIAGNOSIS — O10919 Unspecified pre-existing hypertension complicating pregnancy, unspecified trimester: Secondary | ICD-10-CM | POA: Insufficient documentation

## 2020-05-27 DIAGNOSIS — Z283 Underimmunization status: Secondary | ICD-10-CM | POA: Insufficient documentation

## 2020-05-27 DIAGNOSIS — G40909 Epilepsy, unspecified, not intractable, without status epilepticus: Secondary | ICD-10-CM | POA: Diagnosis not present

## 2020-05-27 DIAGNOSIS — R569 Unspecified convulsions: Secondary | ICD-10-CM | POA: Diagnosis present

## 2020-05-27 DIAGNOSIS — O09899 Supervision of other high risk pregnancies, unspecified trimester: Secondary | ICD-10-CM

## 2020-05-27 DIAGNOSIS — O24319 Unspecified pre-existing diabetes mellitus in pregnancy, unspecified trimester: Secondary | ICD-10-CM

## 2020-05-27 DIAGNOSIS — Z5321 Procedure and treatment not carried out due to patient leaving prior to being seen by health care provider: Secondary | ICD-10-CM | POA: Diagnosis present

## 2020-05-27 DIAGNOSIS — Z3A26 26 weeks gestation of pregnancy: Secondary | ICD-10-CM

## 2020-05-27 DIAGNOSIS — O99352 Diseases of the nervous system complicating pregnancy, second trimester: Secondary | ICD-10-CM | POA: Diagnosis not present

## 2020-05-27 DIAGNOSIS — O10012 Pre-existing essential hypertension complicating pregnancy, second trimester: Secondary | ICD-10-CM

## 2020-05-27 DIAGNOSIS — E669 Obesity, unspecified: Secondary | ICD-10-CM

## 2020-05-27 DIAGNOSIS — O2692 Pregnancy related conditions, unspecified, second trimester: Secondary | ICD-10-CM

## 2020-05-27 DIAGNOSIS — O0990 Supervision of high risk pregnancy, unspecified, unspecified trimester: Secondary | ICD-10-CM | POA: Insufficient documentation

## 2020-05-27 DIAGNOSIS — O99891 Other specified diseases and conditions complicating pregnancy: Secondary | ICD-10-CM | POA: Diagnosis present

## 2020-05-27 DIAGNOSIS — O24312 Unspecified pre-existing diabetes mellitus in pregnancy, second trimester: Secondary | ICD-10-CM | POA: Diagnosis not present

## 2020-05-27 DIAGNOSIS — O99212 Obesity complicating pregnancy, second trimester: Secondary | ICD-10-CM

## 2020-05-27 LAB — URINALYSIS, ROUTINE W REFLEX MICROSCOPIC
Bilirubin Urine: NEGATIVE
Glucose, UA: >=500 mg/dL — AB
Hgb urine dipstick: NEGATIVE
Ketones, ur: NEGATIVE mg/dL
Leukocytes,Ua: NEGATIVE
Nitrite: NEGATIVE
Protein, ur: NEGATIVE mg/dL
Specific Gravity, Urine: 1.022 (ref 1.005–1.030)
pH: 5 (ref 5.0–8.0)

## 2020-05-27 NOTE — MAU Note (Signed)
Pt not in room, in triage, or in triage bathroom.  Registration desk states patient walked out front doors.  Pt left AMA without being seen.

## 2020-05-27 NOTE — MAU Note (Signed)
Cleaning up her room, floor was wet, slipped and fell on her stomach and face.  Having pain on RLQ. Denies bleeding, question leaking- felt when fell and has since.

## 2020-05-31 ENCOUNTER — Telehealth: Payer: Self-pay

## 2020-05-31 NOTE — Telephone Encounter (Signed)
Elevated BP alert received from 05/30/20 of 152/98. Per [facility] review, no rechecks documented since that time. Pt has follow up appt in office on 06/02/20. Called pt at mobile number; VM left requesting a call back or response to MyChart message. Called home number; call cannot be completed. MyChart message sent.

## 2020-06-01 ENCOUNTER — Other Ambulatory Visit: Payer: Self-pay

## 2020-06-01 ENCOUNTER — Encounter (HOSPITAL_COMMUNITY): Payer: Self-pay | Admitting: Family Medicine

## 2020-06-01 ENCOUNTER — Telehealth (INDEPENDENT_AMBULATORY_CARE_PROVIDER_SITE_OTHER): Payer: Medicaid Other

## 2020-06-01 ENCOUNTER — Inpatient Hospital Stay (HOSPITAL_COMMUNITY)
Admission: AD | Admit: 2020-06-01 | Discharge: 2020-06-01 | Disposition: A | Discharge status: Home / Self Care | Payer: Medicaid Other | Attending: Family Medicine | Admitting: Family Medicine

## 2020-06-01 DIAGNOSIS — Z794 Long term (current) use of insulin: Secondary | ICD-10-CM | POA: Insufficient documentation

## 2020-06-01 DIAGNOSIS — Z7982 Long term (current) use of aspirin: Secondary | ICD-10-CM | POA: Diagnosis not present

## 2020-06-01 DIAGNOSIS — R519 Headache, unspecified: Secondary | ICD-10-CM | POA: Diagnosis not present

## 2020-06-01 DIAGNOSIS — O0990 Supervision of high risk pregnancy, unspecified, unspecified trimester: Secondary | ICD-10-CM

## 2020-06-01 DIAGNOSIS — Z3689 Encounter for other specified antenatal screening: Secondary | ICD-10-CM

## 2020-06-01 DIAGNOSIS — E119 Type 2 diabetes mellitus without complications: Secondary | ICD-10-CM | POA: Diagnosis not present

## 2020-06-01 DIAGNOSIS — O99891 Other specified diseases and conditions complicating pregnancy: Secondary | ICD-10-CM | POA: Diagnosis not present

## 2020-06-01 DIAGNOSIS — O24112 Pre-existing diabetes mellitus, type 2, in pregnancy, second trimester: Secondary | ICD-10-CM | POA: Diagnosis not present

## 2020-06-01 DIAGNOSIS — Z8673 Personal history of transient ischemic attack (TIA), and cerebral infarction without residual deficits: Secondary | ICD-10-CM | POA: Diagnosis not present

## 2020-06-01 DIAGNOSIS — O26892 Other specified pregnancy related conditions, second trimester: Secondary | ICD-10-CM | POA: Insufficient documentation

## 2020-06-01 DIAGNOSIS — O10012 Pre-existing essential hypertension complicating pregnancy, second trimester: Secondary | ICD-10-CM | POA: Insufficient documentation

## 2020-06-01 DIAGNOSIS — Z3A26 26 weeks gestation of pregnancy: Secondary | ICD-10-CM | POA: Diagnosis not present

## 2020-06-01 DIAGNOSIS — O10019 Pre-existing essential hypertension complicating pregnancy, unspecified trimester: Secondary | ICD-10-CM

## 2020-06-01 DIAGNOSIS — Z79899 Other long term (current) drug therapy: Secondary | ICD-10-CM | POA: Insufficient documentation

## 2020-06-01 LAB — COMPREHENSIVE METABOLIC PANEL
ALT: 13 U/L (ref 0–44)
AST: 14 U/L — ABNORMAL LOW (ref 15–41)
Albumin: 2.6 g/dL — ABNORMAL LOW (ref 3.5–5.0)
Alkaline Phosphatase: 56 U/L (ref 38–126)
Anion gap: 8 (ref 5–15)
BUN: 10 mg/dL (ref 6–20)
CO2: 20 mmol/L — ABNORMAL LOW (ref 22–32)
Calcium: 9.3 mg/dL (ref 8.9–10.3)
Chloride: 105 mmol/L (ref 98–111)
Creatinine, Ser: 0.5 mg/dL (ref 0.44–1.00)
GFR calc Af Amer: >60 mL/min (ref >60)
GFR calc non Af Amer: >60 mL/min (ref >60)
Glucose, Bld: 414 mg/dL — ABNORMAL HIGH (ref 70–99)
Potassium: 4.1 mmol/L (ref 3.5–5.1)
Sodium: 133 mmol/L — ABNORMAL LOW (ref 135–145)
Total Bilirubin: 0.5 mg/dL (ref 0.3–1.2)
Total Protein: 6.1 g/dL — ABNORMAL LOW (ref 6.5–8.1)

## 2020-06-01 LAB — CBC
HCT: 32 % — ABNORMAL LOW (ref 36.0–46.0)
Hemoglobin: 10.5 g/dL — ABNORMAL LOW (ref 12.0–15.0)
MCH: 28.2 pg (ref 26.0–34.0)
MCHC: 32.8 g/dL (ref 30.0–36.0)
MCV: 85.8 fL (ref 80.0–100.0)
Platelets: 153 10*3/uL (ref 150–400)
RBC: 3.73 MIL/uL — ABNORMAL LOW (ref 3.87–5.11)
RDW: 15.2 % (ref 11.5–15.5)
WBC: 6.9 10*3/uL (ref 4.0–10.5)
nRBC: 0 % (ref 0.0–0.2)

## 2020-06-01 LAB — URINALYSIS, ROUTINE W REFLEX MICROSCOPIC
Bilirubin Urine: NEGATIVE
Glucose, UA: >=500 mg/dL — AB
Hgb urine dipstick: NEGATIVE
Ketones, ur: NEGATIVE mg/dL
Leukocytes,Ua: NEGATIVE
Nitrite: NEGATIVE
Protein, ur: NEGATIVE mg/dL
Specific Gravity, Urine: 1.023 (ref 1.005–1.030)
pH: 7 (ref 5.0–8.0)

## 2020-06-01 LAB — PROTEIN / CREATININE RATIO, URINE
Creatinine, Urine: 17.46 mg/dL
Total Protein, Urine: <6 mg/dL

## 2020-06-01 LAB — GLUCOSE, CAPILLARY: Glucose-Capillary: 261 mg/dL — ABNORMAL HIGH (ref 70–99)

## 2020-06-01 MED ORDER — INSULIN ASPART 100 UNIT/ML ~~LOC~~ SOLN
10.0000 [IU] | Freq: Once | SUBCUTANEOUS | Status: AC
  Administered 2020-06-01: 10 [IU] via SUBCUTANEOUS

## 2020-06-01 MED ORDER — INSULIN ASPART 100 UNIT/ML ~~LOC~~ SOLN: 10.0000 [IU] | Freq: Once | SUBCUTANEOUS | Status: DC

## 2020-06-01 MED ORDER — LABETALOL HCL 100 MG PO TABS
200.0000 mg | ORAL_TABLET | Freq: Two times a day (BID) | ORAL | 0 refills | Status: DC
Start: 2020-06-01 — End: 2020-07-07

## 2020-06-01 MED ORDER — LACTATED RINGERS IV BOLUS: 1000.0000 mL | Freq: Once | INTRAVENOUS | Status: DC

## 2020-06-01 MED ORDER — BUTALBITAL-APAP-CAFFEINE 50-325-40 MG PO TABS
1.0000 | ORAL_TABLET | Freq: Once | ORAL | Status: AC
  Administered 2020-06-01: 1 via ORAL
  Filled 2020-06-01: qty 1

## 2020-06-01 MED ORDER — BUTALBITAL-APAP-CAFFEINE 50-325-40 MG PO TABS: 1.0000 | ORAL_TABLET | Freq: Once | ORAL | Status: DC

## 2020-06-01 NOTE — MAU Note (Signed)
Pt presents stating she was called and informed to report to hospital for admission secondary HBP States currently has H/A, has had x3 days.  Took Tylenol @ 1600.  Denies epigastric pain, endorses blurry vision.

## 2020-06-01 NOTE — MAU Provider Note (Signed)
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 Specific Question:   Supervising Provider    Answer:   STTruett [id number]]    Assessment and Plan   1. Pre-existing essential hypertension during pregnancy, antepartum   2. Type 2 diabetes mellitus without complication, without long-term current use of insulin (HCC)   3. Headache in pregnancy, antepartum, second trimester   4. [redacted] weeks gestation of pregnancy   5. NST (non-stress test) reactive     Allergies as of 06/01/2020      Reactions   Morphine And Related Anaphylaxis   Shellfish Allergy Anaphylaxis   "Swell up and can't breath"   TuBlain Paisllergy] Anaphylaxis   "couldn't breath"   Latex Dermatitis   Tomato Hives      Medication List    TAKE these medications   Accu-Chek Softclix Lancets lancets 100  each by Other route 4 (four) times daily. Use as instructed   aspirin EC 81 MG tablet Take 2 tablets (162 mg total) by mouth daily.   blood glucose meter kit and supplies Dispense based on patient and insurance preference. Use up to four times daily as directed. (FOR ICD-10 E10.9, E11.9).   Doxylamine-Pyridoxine 10-10 MG Tbec Commonly known as: Diclegis Take 2 tablets by mouth at bedtime. If symptoms persist, add one tablet in the morning and one in the afternoon   ferrous sulfate 325 (65 FE) MG tablet  Take 1 tablet (325 mg total) by mouth 2 (two) times daily with a meal.   glucose blood test strip Use as instructed   insulin aspart 100 UNIT/ML injection Commonly known as: NovoLOG Inject 5 Units into the skin 3 (three) times daily before meals.   insulin NPH Human 100 UNIT/ML injection Commonly known as: NOVOLIN N Inject 0.12 mLs (12 Units total) into the skin 2 (two) times daily at 8 am and 10 pm.   labetalol 100 MG tablet Commonly known as: NORMODYNE Take 2 tablets (200 mg total) by mouth 2 (two) times daily.   ondansetron 4 MG disintegrating tablet Commonly known as: Zofran ODT Take 1 tablet (4 mg total) by mouth 2 (two) times daily. Take 30 minutes before medication.   Prenatabs Rx 29-1 MG Tabs Take 1 tablet by mouth daily.       -pt instructed to take insulin tonight, as scheduled -RX labetalol 241m BID -pt to keep OB appt tomorrow -referral to [facility] clinic -Reviewed warning blood pressure values (systolic = / > 1116and/or diastolic =/> 90). Explained that, if blood pressure is elevated, she should sit down, rest, and eat/drink something. If still elevated 15 minutes later, and she is greater than 20 weeks, she should call clinic or come to MAU. She should come to MAU if she has elevated pressures and any of the following:  - headache not relieved with tylenol, rest, hydration -blurry vision, floating spots in her vision - sudden full-body edema or facial edema -RUQ  pain that is constant. These symptoms may indicate that her blood pressure is worsening and she may be developing gestational hypertension or pre-eclampsia, which is an emergency.  -return MAU precautions given -pt discharged to home in stable condition  NGerrie NordmannNugent 06/01/2020, 9:44 PM

## 2020-06-01 NOTE — Progress Notes (Signed)
N. Nugent, NP At bedside with U/S to help assist in locating FH.

## 2020-06-01 NOTE — Progress Notes (Signed)
Attempting to find [facility], able to auscultate but not tracing

## 2020-06-01 NOTE — Discharge Instructions (Signed)
Hypertension During Pregnancy High blood pressure (hypertension) is when the force of blood pumping through the arteries is too strong. Arteries are blood vessels that carry blood from the heart throughout the body. Hypertension during pregnancy can be mild or severe. Severe hypertension during pregnancy (preeclampsia) is a medical emergency that requires prompt evaluation and treatment. Different types of hypertension can happen during pregnancy. These include:  Chronic hypertension. This happens when you had high blood pressure before you became pregnant, and it continues during the pregnancy. Hypertension that develops before you are [redacted] weeks pregnant and continues during the pregnancy is also called chronic hypertension. If you have chronic hypertension, it will not go away after you have your baby. You will need follow-up visits with your health care provider after you have your baby. Your doctor may want you to keep taking medicine for your blood pressure.  Gestational hypertension. This is hypertension that develops after the 20th week of pregnancy. Gestational hypertension usually goes away after you have your baby, but your health care provider will need to monitor your blood pressure to make sure that it is getting better.  Preeclampsia. This is severe hypertension during pregnancy. This can cause serious complications for you and your baby and can also cause complications for you after the delivery of your baby.  Postpartum preeclampsia. You may develop severe hypertension after giving birth. This usually occurs within 48 hours after childbirth but may occur up to 6 weeks after giving birth. This is rare. How does this affect me? Women who have hypertension during pregnancy have a greater chance of developing hypertension later in life or during future pregnancies. In some cases, hypertension during pregnancy can cause serious complications, such as:  Stroke.  Heart attack.  Injury to  other organs, such as kidneys, lungs, or liver.  Preeclampsia.  Convulsions or seizures.  Placental abruption. How does this affect my baby? Hypertension during pregnancy can affect your baby. Your baby may:  Be born early (prematurely).  Not weigh as much as he or she should at birth (low birth weight).  Not tolerate labor well, leading to an unplanned cesarean delivery. What are the risks? There are certain factors that make it more likely for you to develop hypertension during pregnancy. These include:  Having hypertension during a previous pregnancy.  Being overweight.  Being age 35 or older.  Being pregnant for the first time.  Being pregnant with more than one baby.  Becoming pregnant using fertilization methods, such as IVF (in vitro fertilization).  Having other medical problems, such as diabetes, kidney disease, or lupus.  Having a family history of hypertension. What can I do to lower my risk? The exact cause of hypertension during pregnancy is not known. You may be able to lower your risk by:  Maintaining a healthy weight.  Eating a healthy and balanced diet.  Following your health care provider's instructions about treating any long-term conditions that you had before becoming pregnant. It is very important to keep all of your prenatal care appointments. Your health care provider will check your blood pressure and make sure that your pregnancy is progressing as expected. If a problem is found, early treatment can prevent complications. How is this treated? Treatment for hypertension during pregnancy varies depending on the type of hypertension you have and how serious it is.  If you were taking medicine for high blood pressure before you became pregnant, talk with your health care provider. You may need to change medicine during pregnancy because   some medicines, like ACE inhibitors, may not be considered safe for your baby.  If you have gestational  hypertension, your health care provider may order medicine to treat this during pregnancy.  If you are at risk for preeclampsia, your health care provider may recommend that you take a low-dose aspirin during your pregnancy.  If you have severe hypertension, you may need to be hospitalized so you and your baby can be monitored closely. You may also need to be given medicine to lower your blood pressure. This medicine may be given by mouth or through an IV.  In some cases, if your condition gets worse, you may need to deliver your baby early. Follow these instructions at home: Eating and drinking   Drink enough fluid to keep your urine pale yellow.  Avoid caffeine. Lifestyle  Do not use any products that contain nicotine or tobacco, such as cigarettes, e-cigarettes, and chewing tobacco. If you need help quitting, ask your health care provider.  Do not use alcohol or drugs.  Avoid stress as much as possible.  Rest and get plenty of sleep.  Regular exercise can help to reduce your blood pressure. Ask your health care provider what kinds of exercise are best for you. General instructions  Take over-the-counter and prescription medicines only as told by your health care provider.  Keep all prenatal and follow-up visits as told by your health care provider. This is important. Contact a health care provider if:  You have symptoms that your health care provider told you may require more treatment or monitoring, such as: ? Headaches. ? Nausea or vomiting. ? Abdominal pain. ? Dizziness. ? Light-headedness. Get help right away if:  You have: ? Severe abdominal pain that does not get better with treatment. ? A severe headache that does not get better. ? Vomiting that does not get better. ? Sudden, rapid weight gain. ? Sudden swelling in your hands, ankles, or face. ? Vaginal bleeding. ? Blood in your urine. ? Blurred or double vision. ? Shortness of breath or chest  pain. ? Weakness on one side of your body. ? Difficulty speaking.  Your baby is not moving as much as usual. Summary  High blood pressure (hypertension) is when the force of blood pumping through the arteries is too strong.  Hypertension during pregnancy can cause problems for you and your baby.  Treatment for hypertension during pregnancy varies depending on the type of hypertension you have and how serious it is.  Keep all prenatal and follow-up visits as told by your health care provider. This is important. This information is not intended to replace advice given to you by your health care provider. Make sure you discuss any questions you have with your health care provider. Document Revised: 01/23/2019 Document Reviewed: 10/29/2018 Elsevier Patient Education  [place].        Preeclampsia and Eclampsia Preeclampsia is a serious condition that may develop during pregnancy. This condition causes high blood pressure and increased protein in your urine along with other symptoms, such as headaches and vision changes. These symptoms may develop as the condition gets worse. Preeclampsia may occur at 20 weeks of pregnancy or later. Diagnosing and treating preeclampsia early is very important. If not treated early, it can cause serious problems for you and your baby. One problem it can lead to is eclampsia. Eclampsia is a condition that causes muscle jerking or shaking (convulsions or seizures) and other serious problems for the mother. During pregnancy, delivering your baby  may be the best treatment for preeclampsia or eclampsia. For most women, preeclampsia and eclampsia symptoms go away after giving birth. In rare cases, a woman may develop preeclampsia after giving birth (postpartum preeclampsia). This usually occurs within 48 hours after childbirth but may occur up to 6 weeks after giving birth. What are the causes? The cause of preeclampsia is not known. What increases the  risk? The following risk factors make you more likely to develop preeclampsia:  Being pregnant for the first time.  Having had preeclampsia during a past pregnancy.  Having a family history of preeclampsia.  Having high blood pressure.  Being pregnant with more than one baby.  Being 63 or older.  Being African-American.  Having kidney disease or diabetes.  Having medical conditions such as lupus or blood diseases.  Being very overweight (obese). What are the signs or symptoms? The most common symptoms are:  Severe headaches.  Vision problems, such as blurred or double vision.  Abdominal pain, especially upper abdominal pain. Other symptoms that may develop as the condition gets worse include:  Sudden weight gain.  Sudden swelling of the hands, face, legs, and feet.  Severe nausea and vomiting.  Numbness in the face, arms, legs, and feet.  Dizziness.  Urinating less than usual.  Slurred speech.  Convulsions or seizures. How is this diagnosed? There are no screening tests for preeclampsia. Your health care provider will ask you about symptoms and check for signs of preeclampsia during your prenatal visits. You may also have tests that include:  Checking your blood pressure.  Urine tests to check for protein. Your health care provider will check for this at every prenatal visit.  Blood tests.  Monitoring your baby's heart rate.  Ultrasound. How is this treated? You and your health care provider will determine the treatment approach that is best for you. Treatment may include:  Having more frequent prenatal exams to check for signs of preeclampsia, if you have an increased risk for preeclampsia.  Medicine to lower your blood pressure.  Staying in the hospital, if your condition is severe. There, treatment will focus on controlling your blood pressure and the amount of fluids in your body (fluid retention).  Taking medicine (magnesium sulfate) to prevent  seizures. This may be given as an injection or through an IV.  Taking a low-dose aspirin during your pregnancy.  Delivering your baby early. You may have your labor started with medicine (induced), or you may have a cesarean delivery. Follow these instructions at home: Eating and drinking   Drink enough fluid to keep your urine pale yellow.  Avoid caffeine. Lifestyle  Do not use any products that contain nicotine or tobacco, such as cigarettes and e-cigarettes. If you need help quitting, ask your health care provider.  Do not use alcohol or drugs.  Avoid stress as much as possible. Rest and get plenty of sleep. General instructions  Take over-the-counter and prescription medicines only as told by your health care provider.  When lying down, lie on your left side. This keeps pressure off your major blood vessels.  When sitting or lying down, raise (elevate) your feet. Try putting some pillows underneath your lower legs.  Exercise regularly. Ask your health care provider what kinds of exercise are best for you.  Keep all follow-up and prenatal visits as told by your health care provider. This is important. How is this prevented? There is no known way of preventing preeclampsia or eclampsia from developing. However, to lower your risk  of complications and detect problems early:  Get regular prenatal care. Your health care provider may be able to diagnose and treat the condition early.  Maintain a healthy weight. Ask your health care provider for help managing weight gain during pregnancy.  Work with your health care provider to manage any long-term (chronic) health conditions you have, such as diabetes or kidney problems.  You may have tests of your blood pressure and kidney function after giving birth.  Your health care provider may have you take low-dose aspirin during your next pregnancy. Contact a health care provider if:  You have symptoms that your health care provider  told you may require more treatment or monitoring, such as: ? Headaches. ? Nausea or vomiting. ? Abdominal pain. ? Dizziness. ? Light-headedness. Get help right away if:  You have severe: ? Abdominal pain. ? Headaches that do not get better. ? Dizziness. ? Vision problems. ? Confusion. ? Nausea or vomiting.  You have any of the following: ? A seizure. ? Sudden, rapid weight gain. ? Sudden swelling in your hands, ankles, or face. ? Trouble moving any part of your body. ? Numbness in any part of your body. ? Trouble speaking. ? Abnormal bleeding.  You faint. Summary  Preeclampsia is a serious condition that may develop during pregnancy.  This condition causes high blood pressure and increased protein in your urine along with other symptoms, such as headaches and vision changes.  Diagnosing and treating preeclampsia early is very important. If not treated early, it can cause serious problems for you and your baby.  Get help right away if you have symptoms that your health care provider told you to watch for. This information is not intended to replace advice given to you by your health care provider. Make sure you discuss any questions you have with your health care provider. Document Revised: 06/04/2018 Document Reviewed: 05/08/2016 Elsevier Patient Education  [place].

## 2020-06-01 NOTE — Telephone Encounter (Signed)
Pt has had multiple elevated BP reported in Babyscripts. Values reviewed with Elgie Congo, MD who gives verbal order for Labetalol 200 mg BID.   Called pt. Symptoms reviewed with pt. Pt reports persistent headache for the past few days. Headache does not improve with Tylenol. Reports fatigue and some shortness of breath. Reviewed with Elgie Congo, MD who would like pt to be evaluated at MAU. Explained to pt it is very important that you are seen in MAU immediately for lab work and BP monitoring. Explained that these can be signs of preeclampsia which can be life threatening if not treated. Explained that we will wait to prescribe BP meds until afterwards. Will follow up with pt at appt tomorrow.   BP reported in Babyscripts as follows:

## 2020-06-02 ENCOUNTER — Ambulatory Visit (INDEPENDENT_AMBULATORY_CARE_PROVIDER_SITE_OTHER): Payer: Medicaid Other | Admitting: Family Medicine

## 2020-06-02 VITALS — BP 138/85 | HR 102 | Wt 202.0 lb

## 2020-06-02 DIAGNOSIS — O10019 Pre-existing essential hypertension complicating pregnancy, unspecified trimester: Secondary | ICD-10-CM | POA: Diagnosis not present

## 2020-06-02 DIAGNOSIS — Z283 Underimmunization status: Secondary | ICD-10-CM

## 2020-06-02 DIAGNOSIS — O365990 Maternal care for other known or suspected poor fetal growth, unspecified trimester, not applicable or unspecified: Secondary | ICD-10-CM

## 2020-06-02 DIAGNOSIS — Z2839 Other underimmunization status: Secondary | ICD-10-CM

## 2020-06-02 DIAGNOSIS — Z8673 Personal history of transient ischemic attack (TIA), and cerebral infarction without residual deficits: Secondary | ICD-10-CM

## 2020-06-02 DIAGNOSIS — K219 Gastro-esophageal reflux disease without esophagitis: Secondary | ICD-10-CM

## 2020-06-02 DIAGNOSIS — O0990 Supervision of high risk pregnancy, unspecified, unspecified trimester: Secondary | ICD-10-CM

## 2020-06-02 DIAGNOSIS — O24419 Gestational diabetes mellitus in pregnancy, unspecified control: Secondary | ICD-10-CM

## 2020-06-02 DIAGNOSIS — I7771 Dissection of carotid artery: Secondary | ICD-10-CM

## 2020-06-02 DIAGNOSIS — O99891 Other specified diseases and conditions complicating pregnancy: Secondary | ICD-10-CM

## 2020-06-02 LAB — POCT URINALYSIS DIP (DEVICE)
Bilirubin Urine: NEGATIVE
Glucose, UA: 500 mg/dL — AB
Hgb urine dipstick: NEGATIVE
Ketones, ur: NEGATIVE mg/dL
Leukocytes,Ua: NEGATIVE
Nitrite: NEGATIVE
Protein, ur: NEGATIVE mg/dL
Specific Gravity, Urine: 1.025 (ref 1.005–1.030)
Urobilinogen, UA: 0.2 mg/dL (ref 0.0–1.0)
pH: 6 (ref 5.0–8.0)

## 2020-06-02 MED ORDER — GLUCOSE BLOOD VI STRP: ORAL_STRIP | 12 refills | Status: DC

## 2020-06-02 MED ORDER — PANTOPRAZOLE SODIUM 20 MG PO TBEC: 20.0000 mg | DELAYED_RELEASE_TABLET | Freq: Every day | ORAL | 1 refills | Status: DC

## 2020-06-02 NOTE — Progress Notes (Signed)
Since MAU visit yesterday, patient reports no headache, shortness of breath, blurry vision, or swelling. Patient has not felt fetal movement since yesterday at 8PM. Normally, movement is felt during the day and night. Patient has been feeling throbbing in right leg only from last night at 8pm to today at 1pm. Patient also reports acid reflux that has been occurring for over a week.

## 2020-06-02 NOTE — Progress Notes (Signed)
    PRENATAL VISIT NOTE  Subjective:  Joann Meyers is a 34 y.o. G1P0000 at 74w6dbeing seen today for ongoing prenatal care.  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  Patient reports no complaints.  Contractions: Not present. Vag. Bleeding: None.  Movement: Absent. Denies leaking of fluid.   The following portions of the patient's history were reviewed and updated as appropriate: allergies, current medications, past family history, past medical history, past social history, past surgical history and problem list.   Objective:   Vitals:   06/02/20 1515  BP: 138/85  Pulse: (!) 102  Weight: 202 lb (91.6 kg)    Fetal Status: Fetal Heart Rate (bpm): 158   Movement: Absent     General:  Alert, oriented and cooperative. Patient is in no acute distress.  Skin: Skin is warm and dry. No rash noted.   Cardiovascular: Normal heart rate noted  Respiratory: Normal respiratory effort, no problems with respiration noted  Abdomen: Soft, gravid, appropriate for gestational age.  Pain/Pressure: Absent     Pelvic: Cervical exam deferred        Extremities: Normal range of motion.  Edema: None  Mental Status: Normal mood and affect. Normal  behavior. Normal judgment and thought content.   Assessment and Plan:  Pregnancy: G1P0000 at 285w6d. Pre-existing essential hypertension during pregnancy, antepartum BP ok today, on no meds Prescribed labetalol yesterday--she asks if this is safe in pregnancy   2. Gestational diabetes mellitus (GDM) affecting pregnancy, antepartum - Ambulatory referral to Ophthalmology CBG > 400 yesterday, did not take her insulin Discussed risks of not taking her CBGs and her insulin including fetal death.  3. Supervision of high risk pregnancy, antepartum 28 wk labs and TDaP next visit.  4. Rubella non-immune status, antepartum Needs MMR pp  5. Pregnancy affected by fetal growth restriction F/u u/s scheduled  6. 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HCC) No further w/u  7. History of stroke On 2 ASA daily--per pregnancy navigator, there is a question about her ability to truly understand her health situation, the importance of medications and ability to handle a newborn. Will refer to neuropsych for formal testing. - Ambulatory referral to Neuropsychology  8. Gastroesophageal reflux disease without esophagitis PPI order to be taken 2x/wk.  Preterm labor symptoms and general obstetric precautions including but not limited to vaginal bleeding, contractions, leaking of fluid and fetal movement were reviewed in detail with the patient. Please refer to After Visit Summary for other counseling recommendations.   [facility] (on 06[facility]16/2020[facility]  Future Appointments  Date Time Provider De[place]aria9/06/2020 11:00 AM [facility]9/06/2020 11:15 AM [facility] US2 [facility]    TaDonnamae JudeMD

## 2020-06-02 NOTE — Patient Instructions (Signed)

## 2020-06-15 ENCOUNTER — Encounter: Payer: Self-pay | Admitting: *Deleted

## 2020-06-16 ENCOUNTER — Telehealth: Payer: Self-pay | Admitting: Radiology

## 2020-06-16 NOTE — Telephone Encounter (Signed)
Left message on voicemail and sent mychart call [facility] to schedule appointment from referral for [place] with Allie Dimmer, NP

## 2020-06-17 ENCOUNTER — Other Ambulatory Visit: Payer: Self-pay

## 2020-06-17 DIAGNOSIS — Z8673 Personal history of transient ischemic attack (TIA), and cerebral infarction without residual deficits: Secondary | ICD-10-CM

## 2020-06-17 NOTE — Progress Notes (Signed)
Initial neuropsych referral denied by receiving office. Pt referred to [facility] following phone call to confirm this office provides neuropsych evaluations. Staff member gives verbal confirmation and states they will reach out to pt with new pt appt.

## 2020-06-19 ENCOUNTER — Ambulatory Visit (HOSPITAL_COMMUNITY)
Admission: EM | Admit: 2020-06-19 | Discharge: 2020-06-19 | Disposition: A | Discharge status: Home / Self Care | Payer: Medicaid Other

## 2020-06-19 ENCOUNTER — Other Ambulatory Visit: Payer: Self-pay

## 2020-06-19 ENCOUNTER — Encounter (HOSPITAL_COMMUNITY): Payer: Self-pay | Admitting: Gynecology

## 2020-06-19 DIAGNOSIS — G5600 Carpal tunnel syndrome, unspecified upper limb: Secondary | ICD-10-CM | POA: Diagnosis not present

## 2020-06-19 DIAGNOSIS — O1210 Gestational proteinuria, unspecified trimester: Secondary | ICD-10-CM

## 2020-06-19 DIAGNOSIS — O169 Unspecified maternal hypertension, unspecified trimester: Secondary | ICD-10-CM

## 2020-06-19 DIAGNOSIS — O26899 Other specified pregnancy related conditions, unspecified trimester: Secondary | ICD-10-CM | POA: Diagnosis not present

## 2020-06-19 LAB — POCT URINALYSIS DIPSTICK, ED / UC
Bilirubin Urine: NEGATIVE
Glucose, UA: 500 mg/dL — AB
Hgb urine dipstick: NEGATIVE
Ketones, ur: NEGATIVE mg/dL
Leukocytes,Ua: NEGATIVE
Nitrite: NEGATIVE
Protein, ur: 30 mg/dL — AB
Specific Gravity, Urine: 1.02 (ref 1.005–1.030)
Urobilinogen, UA: 0.2 mg/dL (ref 0.0–1.0)
pH: 6.5 (ref 5.0–8.0)

## 2020-06-19 MED ORDER — ACETAMINOPHEN 500 MG PO TABS
500.0000 mg | ORAL_TABLET | Freq: Four times a day (QID) | ORAL | 0 refills | Status: DC | PRN
Start: 2020-06-19 — End: 2021-11-08
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
## 2020-06-19 NOTE — ED Triage Notes (Signed)
Patient c/o was seen in the ER on 06/02/20. Pt. Stated was given IV in her left hand and 3 days later start experiencing pain and numbness. Pt. Stated x yesterday left hand started to pain her.

## 2020-06-19 NOTE — ED Notes (Signed)
Patient is being discharged from the Urgent Care and sent to the Emergency Department via private vehicle . Per Augusto Gamble, NP, patient is in need of higher level of care due to abnormal labs in pregnancy. Patient is aware and verbalizes understanding of plan of care.  Vitals:   06/19/20 1319  BP: (!) 163/89  Pulse: 92  Resp: 16  Temp: 98.3 F (36.8 C)  SpO2: 99%

## 2020-06-19 NOTE — Discharge Instructions (Signed)
Please go to [facility] hospital for further evaluation and monitoring.

## 2020-06-23 ENCOUNTER — Encounter: Payer: Medicaid Other | Admitting: Obstetrics & Gynecology

## 2020-06-24 ENCOUNTER — Other Ambulatory Visit: Payer: Self-pay | Admitting: *Deleted

## 2020-06-24 ENCOUNTER — Ambulatory Visit: Payer: Medicaid Other | Attending: Obstetrics and Gynecology

## 2020-06-24 ENCOUNTER — Ambulatory Visit: Payer: Medicaid Other | Admitting: *Deleted

## 2020-06-24 ENCOUNTER — Other Ambulatory Visit: Payer: Self-pay

## 2020-06-24 ENCOUNTER — Other Ambulatory Visit: Payer: Self-pay | Admitting: Obstetrics and Gynecology

## 2020-06-24 DIAGNOSIS — O365990 Maternal care for other known or suspected poor fetal growth, unspecified trimester, not applicable or unspecified: Secondary | ICD-10-CM | POA: Insufficient documentation

## 2020-06-24 DIAGNOSIS — O09899 Supervision of other high risk pregnancies, unspecified trimester: Secondary | ICD-10-CM

## 2020-06-24 DIAGNOSIS — O24113 Pre-existing diabetes mellitus, type 2, in pregnancy, third trimester: Secondary | ICD-10-CM | POA: Diagnosis not present

## 2020-06-24 DIAGNOSIS — O0990 Supervision of high risk pregnancy, unspecified, unspecified trimester: Secondary | ICD-10-CM | POA: Insufficient documentation

## 2020-06-24 DIAGNOSIS — O99891 Other specified diseases and conditions complicating pregnancy: Secondary | ICD-10-CM | POA: Insufficient documentation

## 2020-06-24 DIAGNOSIS — O2693 Pregnancy related conditions, unspecified, third trimester: Secondary | ICD-10-CM | POA: Diagnosis not present

## 2020-06-24 DIAGNOSIS — O99353 Diseases of the nervous system complicating pregnancy, third trimester: Secondary | ICD-10-CM

## 2020-06-24 DIAGNOSIS — Z283 Underimmunization status: Secondary | ICD-10-CM | POA: Diagnosis present

## 2020-06-24 DIAGNOSIS — Z3A30 30 weeks gestation of pregnancy: Secondary | ICD-10-CM

## 2020-06-24 DIAGNOSIS — O24319 Unspecified pre-existing diabetes mellitus in pregnancy, unspecified trimester: Secondary | ICD-10-CM | POA: Insufficient documentation

## 2020-06-24 DIAGNOSIS — R569 Unspecified convulsions: Secondary | ICD-10-CM

## 2020-06-24 DIAGNOSIS — O10919 Unspecified pre-existing hypertension complicating pregnancy, unspecified trimester: Secondary | ICD-10-CM | POA: Insufficient documentation

## 2020-06-24 DIAGNOSIS — E669 Obesity, unspecified: Secondary | ICD-10-CM

## 2020-06-24 DIAGNOSIS — Z3490 Encounter for supervision of normal pregnancy, unspecified, unspecified trimester: Secondary | ICD-10-CM

## 2020-06-24 DIAGNOSIS — G40909 Epilepsy, unspecified, not intractable, without status epilepticus: Secondary | ICD-10-CM

## 2020-06-24 DIAGNOSIS — O99213 Obesity complicating pregnancy, third trimester: Secondary | ICD-10-CM

## 2020-06-24 DIAGNOSIS — O10913 Unspecified pre-existing hypertension complicating pregnancy, third trimester: Secondary | ICD-10-CM | POA: Diagnosis not present

## 2020-06-24 DIAGNOSIS — O365930 Maternal care for other known or suspected poor fetal growth, third trimester, not applicable or unspecified: Secondary | ICD-10-CM

## 2020-06-24 NOTE — Procedures (Signed)
Joann Meyers 1986-10-11 [redacted]w[redacted]d  Fetus A Non-Stress Test Interpretation for 06/24/20  Indication: IUGR  Fetal Heart Rate A Mode: External Baseline Rate (A): 125 bpm Variability: Moderate Accelerations: 10 x 10 Multiple birth?: No  Uterine Activity Mode: Palpation, Toco Contraction Frequency (min): none noted Resting Tone Palpated: Relaxed Resting Time: Adequate  Interpretation (Fetal Testing) Nonstress Test Interpretation: Reactive Overall Impression: Reassuring for gestational age Comments: Reviewed tracing with Dr. Donalee Citrin

## 2020-06-25 ENCOUNTER — Encounter: Payer: Self-pay | Admitting: *Deleted

## 2020-06-29 ENCOUNTER — Encounter: Payer: Self-pay | Admitting: *Deleted

## 2020-07-02 ENCOUNTER — Ambulatory Visit: Payer: Medicaid Other | Attending: Obstetrics and Gynecology

## 2020-07-02 ENCOUNTER — Other Ambulatory Visit: Payer: Self-pay

## 2020-07-02 ENCOUNTER — Ambulatory Visit: Payer: Medicaid Other | Admitting: *Deleted

## 2020-07-02 ENCOUNTER — Encounter: Payer: Self-pay | Admitting: *Deleted

## 2020-07-02 ENCOUNTER — Ambulatory Visit (HOSPITAL_BASED_OUTPATIENT_CLINIC_OR_DEPARTMENT_OTHER): Payer: Medicaid Other | Admitting: *Deleted

## 2020-07-02 DIAGNOSIS — Z3A31 31 weeks gestation of pregnancy: Secondary | ICD-10-CM

## 2020-07-02 DIAGNOSIS — O365931 Maternal care for other known or suspected poor fetal growth, third trimester, fetus 1: Secondary | ICD-10-CM

## 2020-07-02 DIAGNOSIS — O2693 Pregnancy related conditions, unspecified, third trimester: Secondary | ICD-10-CM

## 2020-07-02 DIAGNOSIS — O10013 Pre-existing essential hypertension complicating pregnancy, third trimester: Secondary | ICD-10-CM | POA: Diagnosis not present

## 2020-07-02 DIAGNOSIS — O24319 Unspecified pre-existing diabetes mellitus in pregnancy, unspecified trimester: Secondary | ICD-10-CM

## 2020-07-02 DIAGNOSIS — O0990 Supervision of high risk pregnancy, unspecified, unspecified trimester: Secondary | ICD-10-CM | POA: Diagnosis present

## 2020-07-02 DIAGNOSIS — E669 Obesity, unspecified: Secondary | ICD-10-CM

## 2020-07-02 DIAGNOSIS — O365990 Maternal care for other known or suspected poor fetal growth, unspecified trimester, not applicable or unspecified: Secondary | ICD-10-CM | POA: Diagnosis present

## 2020-07-02 DIAGNOSIS — R569 Unspecified convulsions: Secondary | ICD-10-CM

## 2020-07-02 DIAGNOSIS — O99213 Obesity complicating pregnancy, third trimester: Secondary | ICD-10-CM

## 2020-07-02 DIAGNOSIS — O10919 Unspecified pre-existing hypertension complicating pregnancy, unspecified trimester: Secondary | ICD-10-CM | POA: Diagnosis present

## 2020-07-02 DIAGNOSIS — O99891 Other specified diseases and conditions complicating pregnancy: Secondary | ICD-10-CM | POA: Insufficient documentation

## 2020-07-02 DIAGNOSIS — Z283 Underimmunization status: Secondary | ICD-10-CM | POA: Diagnosis present

## 2020-07-02 DIAGNOSIS — O99353 Diseases of the nervous system complicating pregnancy, third trimester: Secondary | ICD-10-CM

## 2020-07-02 DIAGNOSIS — O09899 Supervision of other high risk pregnancies, unspecified trimester: Secondary | ICD-10-CM

## 2020-07-02 DIAGNOSIS — O365930 Maternal care for other known or suspected poor fetal growth, third trimester, not applicable or unspecified: Secondary | ICD-10-CM

## 2020-07-02 DIAGNOSIS — Z3490 Encounter for supervision of normal pregnancy, unspecified, unspecified trimester: Secondary | ICD-10-CM

## 2020-07-02 DIAGNOSIS — G40909 Epilepsy, unspecified, not intractable, without status epilepticus: Secondary | ICD-10-CM

## 2020-07-02 DIAGNOSIS — O24313 Unspecified pre-existing diabetes mellitus in pregnancy, third trimester: Secondary | ICD-10-CM

## 2020-07-02 NOTE — Procedures (Signed)
Joann Meyers 11/15/1985 [redacted]w[redacted]d  Fetus A Non-Stress Test Interpretation for 07/02/20  Indication: Chronic Hypertenstion and Diabetes  Fetal Heart Rate A Mode: External Baseline Rate (A): 125 bpm Variability: Moderate Accelerations: 15 x 15 Decelerations: None Multiple birth?: No  Uterine Activity Mode: Palpation, Toco Contraction Frequency (min): None Resting Tone Palpated: Relaxed Resting Time: Adequate  Interpretation (Fetal Testing) Nonstress Test Interpretation: Reactive Comments: Dr. Annamaria Boots reviewed tracing.

## 2020-07-06 ENCOUNTER — Telehealth: Payer: Self-pay

## 2020-07-06 NOTE — Telephone Encounter (Addendum)
-----   Message from Annabell Howells, RN sent at 07/02/2020 12:17 PM EDT ----- Regarding: neuro referral/refills? Pt told care manager she needs refills on medications. Per chart review pt has refills available. Please follow up with pt about medications.   Also needs follow up regarding neurology referral. Please call Lenna Sciara [phone] with update.    Called pt; pt states she was able to refill all needed medications. [facility] didn't call her with an appt. Called Neurology, documentation shows they tried to reach pt and were unable to speak with her. Staff member requests that I have pt call their office to schedule appt due to needing to give pt specific instructions. Called pt with update. Pt agrees to call to schedule appt, requests I leave a VM with phone number. VM left.   Called Melissa. Update left on VM.

## 2020-07-07 ENCOUNTER — Other Ambulatory Visit: Payer: Self-pay

## 2020-07-07 DIAGNOSIS — O10019 Pre-existing essential hypertension complicating pregnancy, unspecified trimester: Secondary | ICD-10-CM

## 2020-07-07 MED ORDER — LABETALOL HCL 100 MG PO TABS: 200.0000 mg | ORAL_TABLET | Freq: Two times a day (BID) | ORAL | 0 refills | Status: DC

## 2020-07-08 ENCOUNTER — Ambulatory Visit: Payer: Medicaid Other | Admitting: *Deleted

## 2020-07-08 ENCOUNTER — Other Ambulatory Visit: Payer: Self-pay | Admitting: *Deleted

## 2020-07-08 ENCOUNTER — Ambulatory Visit (HOSPITAL_BASED_OUTPATIENT_CLINIC_OR_DEPARTMENT_OTHER): Payer: Medicaid Other

## 2020-07-08 ENCOUNTER — Other Ambulatory Visit: Payer: Self-pay

## 2020-07-08 DIAGNOSIS — O365990 Maternal care for other known or suspected poor fetal growth, unspecified trimester, not applicable or unspecified: Secondary | ICD-10-CM

## 2020-07-08 DIAGNOSIS — O322XX0 Maternal care for transverse and oblique lie, not applicable or unspecified: Secondary | ICD-10-CM

## 2020-07-08 DIAGNOSIS — Z3A32 32 weeks gestation of pregnancy: Secondary | ICD-10-CM

## 2020-07-08 DIAGNOSIS — R569 Unspecified convulsions: Secondary | ICD-10-CM | POA: Insufficient documentation

## 2020-07-08 DIAGNOSIS — O24312 Unspecified pre-existing diabetes mellitus in pregnancy, second trimester: Secondary | ICD-10-CM | POA: Diagnosis not present

## 2020-07-08 DIAGNOSIS — O99353 Diseases of the nervous system complicating pregnancy, third trimester: Secondary | ICD-10-CM

## 2020-07-08 DIAGNOSIS — O0990 Supervision of high risk pregnancy, unspecified, unspecified trimester: Secondary | ICD-10-CM | POA: Insufficient documentation

## 2020-07-08 DIAGNOSIS — O99213 Obesity complicating pregnancy, third trimester: Secondary | ICD-10-CM

## 2020-07-08 DIAGNOSIS — O2693 Pregnancy related conditions, unspecified, third trimester: Secondary | ICD-10-CM

## 2020-07-08 DIAGNOSIS — G40909 Epilepsy, unspecified, not intractable, without status epilepticus: Secondary | ICD-10-CM

## 2020-07-08 DIAGNOSIS — O365930 Maternal care for other known or suspected poor fetal growth, third trimester, not applicable or unspecified: Secondary | ICD-10-CM | POA: Diagnosis not present

## 2020-07-08 DIAGNOSIS — Z2839 Other underimmunization status: Secondary | ICD-10-CM

## 2020-07-08 DIAGNOSIS — Z283 Underimmunization status: Secondary | ICD-10-CM | POA: Insufficient documentation

## 2020-07-08 DIAGNOSIS — O99891 Other specified diseases and conditions complicating pregnancy: Secondary | ICD-10-CM | POA: Insufficient documentation

## 2020-07-08 DIAGNOSIS — O24319 Unspecified pre-existing diabetes mellitus in pregnancy, unspecified trimester: Secondary | ICD-10-CM

## 2020-07-08 DIAGNOSIS — O10913 Unspecified pre-existing hypertension complicating pregnancy, third trimester: Secondary | ICD-10-CM

## 2020-07-08 NOTE — Procedures (Signed)
Joann Meyers 09/25/1986 [redacted]w[redacted]d  Fetus A Non-Stress Test Interpretation for 07/08/20  Indication: IUGR  Fetal Heart Rate A Mode: External Baseline Rate (A): 125 bpm Variability: Moderate Accelerations: 15 x 15 Decelerations: None Multiple birth?: No  Uterine Activity Mode: Palpation, Toco Contraction Frequency (min): none Resting Tone Palpated: Relaxed Resting Time: Adequate  Interpretation (Fetal Testing) Nonstress Test Interpretation: Reactive Comments: Reviewed tracing with Dr. Donalee Citrin

## 2020-07-09 ENCOUNTER — Encounter (HOSPITAL_COMMUNITY): Payer: Self-pay | Admitting: Obstetrics and Gynecology

## 2020-07-09 ENCOUNTER — Ambulatory Visit (INDEPENDENT_AMBULATORY_CARE_PROVIDER_SITE_OTHER): Payer: Medicaid Other | Admitting: Obstetrics and Gynecology

## 2020-07-09 ENCOUNTER — Inpatient Hospital Stay (HOSPITAL_COMMUNITY)
Admission: AD | Admit: 2020-07-09 | Discharge: 2020-07-16 | DRG: 832 | Disposition: A | Discharge status: Home / Self Care | Payer: Medicaid Other | Attending: Obstetrics and Gynecology | Admitting: Obstetrics and Gynecology

## 2020-07-09 ENCOUNTER — Encounter: Payer: Self-pay | Admitting: Family Medicine

## 2020-07-09 VITALS — BP 156/94 | HR 96 | Wt 213.0 lb

## 2020-07-09 DIAGNOSIS — O99013 Anemia complicating pregnancy, third trimester: Secondary | ICD-10-CM | POA: Diagnosis present

## 2020-07-09 DIAGNOSIS — O1403 Mild to moderate pre-eclampsia, third trimester: Secondary | ICD-10-CM | POA: Diagnosis not present

## 2020-07-09 DIAGNOSIS — O0990 Supervision of high risk pregnancy, unspecified, unspecified trimester: Secondary | ICD-10-CM

## 2020-07-09 DIAGNOSIS — O24319 Unspecified pre-existing diabetes mellitus in pregnancy, unspecified trimester: Secondary | ICD-10-CM | POA: Diagnosis present

## 2020-07-09 DIAGNOSIS — E71311 Medium chain acyl CoA dehydrogenase deficiency: Secondary | ICD-10-CM | POA: Diagnosis present

## 2020-07-09 DIAGNOSIS — O09899 Supervision of other high risk pregnancies, unspecified trimester: Secondary | ICD-10-CM

## 2020-07-09 DIAGNOSIS — O119 Pre-existing hypertension with pre-eclampsia, unspecified trimester: Secondary | ICD-10-CM

## 2020-07-09 DIAGNOSIS — Z2839 Other underimmunization status: Secondary | ICD-10-CM

## 2020-07-09 DIAGNOSIS — O1414 Severe pre-eclampsia complicating childbirth: Secondary | ICD-10-CM | POA: Diagnosis present

## 2020-07-09 DIAGNOSIS — R7989 Other specified abnormal findings of blood chemistry: Secondary | ICD-10-CM | POA: Diagnosis present

## 2020-07-09 DIAGNOSIS — Z794 Long term (current) use of insulin: Secondary | ICD-10-CM | POA: Diagnosis not present

## 2020-07-09 DIAGNOSIS — O24113 Pre-existing diabetes mellitus, type 2, in pregnancy, third trimester: Secondary | ICD-10-CM | POA: Diagnosis present

## 2020-07-09 DIAGNOSIS — O1493 Unspecified pre-eclampsia, third trimester: Secondary | ICD-10-CM | POA: Diagnosis not present

## 2020-07-09 DIAGNOSIS — O99019 Anemia complicating pregnancy, unspecified trimester: Secondary | ICD-10-CM | POA: Diagnosis present

## 2020-07-09 DIAGNOSIS — O24313 Unspecified pre-existing diabetes mellitus in pregnancy, third trimester: Secondary | ICD-10-CM | POA: Diagnosis not present

## 2020-07-09 DIAGNOSIS — Z283 Underimmunization status: Secondary | ICD-10-CM

## 2020-07-09 DIAGNOSIS — O113 Pre-existing hypertension with pre-eclampsia, third trimester: Secondary | ICD-10-CM | POA: Diagnosis present

## 2020-07-09 DIAGNOSIS — O99891 Other specified diseases and conditions complicating pregnancy: Secondary | ICD-10-CM

## 2020-07-09 DIAGNOSIS — E119 Type 2 diabetes mellitus without complications: Secondary | ICD-10-CM

## 2020-07-09 DIAGNOSIS — Z20822 Contact with and (suspected) exposure to covid-19: Secondary | ICD-10-CM | POA: Diagnosis present

## 2020-07-09 DIAGNOSIS — O365990 Maternal care for other known or suspected poor fetal growth, unspecified trimester, not applicable or unspecified: Secondary | ICD-10-CM

## 2020-07-09 DIAGNOSIS — Z3A32 32 weeks gestation of pregnancy: Secondary | ICD-10-CM | POA: Diagnosis not present

## 2020-07-09 DIAGNOSIS — E1165 Type 2 diabetes mellitus with hyperglycemia: Secondary | ICD-10-CM | POA: Diagnosis present

## 2020-07-09 DIAGNOSIS — Z9114 Patient's other noncompliance with medication regimen: Secondary | ICD-10-CM | POA: Diagnosis not present

## 2020-07-09 DIAGNOSIS — Z8673 Personal history of transient ischemic attack (TIA), and cerebral infarction without residual deficits: Secondary | ICD-10-CM

## 2020-07-09 DIAGNOSIS — Z9119 Patient's noncompliance with other medical treatment and regimen: Secondary | ICD-10-CM

## 2020-07-09 DIAGNOSIS — Z3A33 33 weeks gestation of pregnancy: Secondary | ICD-10-CM | POA: Diagnosis not present

## 2020-07-09 DIAGNOSIS — E669 Obesity, unspecified: Secondary | ICD-10-CM

## 2020-07-09 DIAGNOSIS — I7771 Dissection of carotid artery: Secondary | ICD-10-CM

## 2020-07-09 DIAGNOSIS — O365930 Maternal care for other known or suspected poor fetal growth, third trimester, not applicable or unspecified: Secondary | ICD-10-CM | POA: Diagnosis present

## 2020-07-09 DIAGNOSIS — O10013 Pre-existing essential hypertension complicating pregnancy, third trimester: Secondary | ICD-10-CM | POA: Diagnosis present

## 2020-07-09 DIAGNOSIS — O10019 Pre-existing essential hypertension complicating pregnancy, unspecified trimester: Secondary | ICD-10-CM

## 2020-07-09 DIAGNOSIS — Z91199 Patient's noncompliance with other medical treatment and regimen due to unspecified reason: Secondary | ICD-10-CM

## 2020-07-09 DIAGNOSIS — O10913 Unspecified pre-existing hypertension complicating pregnancy, third trimester: Secondary | ICD-10-CM | POA: Diagnosis not present

## 2020-07-09 DIAGNOSIS — R030 Elevated blood-pressure reading, without diagnosis of hypertension: Secondary | ICD-10-CM | POA: Diagnosis not present

## 2020-07-09 DIAGNOSIS — D649 Anemia, unspecified: Secondary | ICD-10-CM | POA: Diagnosis present

## 2020-07-09 DIAGNOSIS — D696 Thrombocytopenia, unspecified: Secondary | ICD-10-CM | POA: Diagnosis present

## 2020-07-09 LAB — COMPREHENSIVE METABOLIC PANEL WITH GFR
ALT: 17 U/L (ref 0–44)
AST: 16 U/L (ref 15–41)
Albumin: 2.6 g/dL — ABNORMAL LOW (ref 3.5–5.0)
Alkaline Phosphatase: 66 U/L (ref 38–126)
Anion gap: 9 (ref 5–15)
BUN: 11 mg/dL (ref 6–20)
CO2: 22 mmol/L (ref 22–32)
Calcium: 9 mg/dL (ref 8.9–10.3)
Chloride: 105 mmol/L (ref 98–111)
Creatinine, Ser: 0.51 mg/dL (ref 0.44–1.00)
GFR calc Af Amer: >60 mL/min
GFR calc non Af Amer: >60 mL/min
Glucose, Bld: 310 mg/dL — ABNORMAL HIGH (ref 70–99)
Potassium: 3.9 mmol/L (ref 3.5–5.1)
Sodium: 136 mmol/L (ref 135–145)
Total Bilirubin: 0.5 mg/dL (ref 0.3–1.2)
Total Protein: 5.4 g/dL — ABNORMAL LOW (ref 6.5–8.1)

## 2020-07-09 LAB — PROTEIN / CREATININE RATIO, URINE
Creatinine, Urine: 46.62 mg/dL
Protein Creatinine Ratio: 0.49 mg/mg{Cre} — ABNORMAL HIGH (ref 0.00–0.15)
Total Protein, Urine: 23 mg/dL

## 2020-07-09 LAB — TYPE AND SCREEN
ABO/RH(D): O POS
Antibody Screen: NEGATIVE

## 2020-07-09 LAB — CBC
HCT: 30.6 % — ABNORMAL LOW (ref 36.0–46.0)
Hemoglobin: 10.2 g/dL — ABNORMAL LOW (ref 12.0–15.0)
MCH: 28.1 pg (ref 26.0–34.0)
MCHC: 33.3 g/dL (ref 30.0–36.0)
MCV: 84.3 fL (ref 80.0–100.0)
Platelets: 133 10*3/uL — ABNORMAL LOW (ref 150–400)
RBC: 3.63 MIL/uL — ABNORMAL LOW (ref 3.87–5.11)
RDW: 15.2 % (ref 11.5–15.5)
WBC: 8 10*3/uL (ref 4.0–10.5)
nRBC: 0 % (ref 0.0–0.2)

## 2020-07-09 LAB — URINALYSIS, ROUTINE W REFLEX MICROSCOPIC
Bilirubin Urine: NEGATIVE
Glucose, UA: >=500 mg/dL — AB
Hgb urine dipstick: NEGATIVE
Ketones, ur: NEGATIVE mg/dL
Nitrite: NEGATIVE
Protein, ur: NEGATIVE mg/dL
Specific Gravity, Urine: 1.02 (ref 1.005–1.030)
pH: 6 (ref 5.0–8.0)

## 2020-07-09 LAB — GLUCOSE, CAPILLARY: Glucose-Capillary: 167 mg/dL — ABNORMAL HIGH (ref 70–99)

## 2020-07-09 LAB — RESPIRATORY PANEL BY RT PCR (FLU A&B, COVID)
Influenza A by PCR: NEGATIVE
Influenza B by PCR: NEGATIVE
SARS Coronavirus 2 by RT PCR: NEGATIVE

## 2020-07-09 MED ORDER — ACETAMINOPHEN 325 MG PO TABS
650.0000 mg | ORAL_TABLET | ORAL | Status: DC | PRN
  Administered 2020-07-09 – 2020-07-13 (×2): 650 mg via ORAL
  Filled 2020-07-09 (×2): qty 2

## 2020-07-09 MED ORDER — ZOLPIDEM TARTRATE 5 MG PO TABS: 5.0000 mg | ORAL_TABLET | Freq: Every evening | ORAL | Status: DC | PRN

## 2020-07-09 MED ORDER — SODIUM CHLORIDE 0.9% FLUSH
3.0000 mL | INTRAVENOUS | Status: DC | PRN
  Administered 2020-07-15: 3 mL via INTRAVENOUS

## 2020-07-09 MED ORDER — INSULIN NPH (HUMAN) (ISOPHANE) 100 UNIT/ML ~~LOC~~ SUSP
12.0000 [IU] | Freq: Two times a day (BID) | SUBCUTANEOUS | Status: DC
  Administered 2020-07-09: 12 [IU] via SUBCUTANEOUS
  Filled 2020-07-09: qty 10

## 2020-07-09 MED ORDER — INSULIN ASPART 100 UNIT/ML ~~LOC~~ SOLN: 5.0000 [IU] | Freq: Three times a day (TID) | SUBCUTANEOUS | Status: DC

## 2020-07-09 MED ORDER — PRENATAL MULTIVITAMIN CH
1.0000 | ORAL_TABLET | Freq: Every day | ORAL | Status: DC
  Administered 2020-07-10 – 2020-07-16 (×6): 1 via ORAL
  Filled 2020-07-09 (×6): qty 1

## 2020-07-09 MED ORDER — SODIUM CHLORIDE 0.9% FLUSH
3.0000 mL | Freq: Two times a day (BID) | INTRAVENOUS | Status: DC
  Administered 2020-07-10 – 2020-07-16 (×10): 3 mL via INTRAVENOUS

## 2020-07-09 MED ORDER — DOCUSATE SODIUM 100 MG PO CAPS
100.0000 mg | ORAL_CAPSULE | Freq: Every day | ORAL | Status: DC
  Administered 2020-07-10 – 2020-07-12 (×3): 100 mg via ORAL
  Filled 2020-07-09 (×3): qty 1

## 2020-07-09 MED ORDER — LABETALOL HCL 200 MG PO TABS: 400.0000 mg | ORAL_TABLET | Freq: Two times a day (BID) | ORAL | 3 refills | Status: DC

## 2020-07-09 MED ORDER — SODIUM CHLORIDE 0.9 % IV SOLN: 250.0000 mL | INTRAVENOUS | Status: DC | PRN

## 2020-07-09 MED ORDER — CALCIUM CARBONATE ANTACID 500 MG PO CHEW: 2.0000 | CHEWABLE_TABLET | ORAL | Status: DC | PRN

## 2020-07-09 MED ORDER — LABETALOL HCL 200 MG PO TABS
400.0000 mg | ORAL_TABLET | Freq: Two times a day (BID) | ORAL | Status: DC
  Administered 2020-07-09 – 2020-07-16 (×14): 400 mg via ORAL
  Filled 2020-07-09 (×14): qty 2

## 2020-07-09 MED ORDER — INSULIN ASPART 100 UNIT/ML ~~LOC~~ SOLN
0.0000 [IU] | Freq: Three times a day (TID) | SUBCUTANEOUS | Status: DC
  Administered 2020-07-09: 6 [IU] via SUBCUTANEOUS
  Administered 2020-07-10 (×2): 4 [IU] via SUBCUTANEOUS
  Administered 2020-07-10: 6 [IU] via SUBCUTANEOUS
  Administered 2020-07-10: 12 [IU] via SUBCUTANEOUS
  Administered 2020-07-11: 4 [IU] via SUBCUTANEOUS
  Administered 2020-07-11 – 2020-07-13 (×6): 6 [IU] via SUBCUTANEOUS
  Administered 2020-07-13: 12 [IU] via SUBCUTANEOUS
  Administered 2020-07-13: 9 [IU] via SUBCUTANEOUS
  Administered 2020-07-14: 13 [IU] via SUBCUTANEOUS
  Administered 2020-07-14: 4 [IU] via SUBCUTANEOUS
  Administered 2020-07-14 – 2020-07-15 (×3): 9 [IU] via SUBCUTANEOUS
  Administered 2020-07-15: 4 [IU] via SUBCUTANEOUS
  Administered 2020-07-16 (×2): 6 [IU] via SUBCUTANEOUS

## 2020-07-09 MED ORDER — PANTOPRAZOLE SODIUM 20 MG PO TBEC
20.0000 mg | DELAYED_RELEASE_TABLET | Freq: Every day | ORAL | Status: DC
  Administered 2020-07-11 – 2020-07-16 (×6): 20 mg via ORAL
  Filled 2020-07-09 (×7): qty 1

## 2020-07-09 MED ORDER — ASPIRIN EC 81 MG PO TBEC
162.0000 mg | DELAYED_RELEASE_TABLET | Freq: Every day | ORAL | Status: DC
  Administered 2020-07-10 – 2020-07-16 (×7): 162 mg via ORAL
  Filled 2020-07-09 (×7): qty 2

## 2020-07-09 NOTE — Patient Instructions (Signed)

## 2020-07-09 NOTE — Progress Notes (Signed)
   PRENATAL VISIT NOTE  Subjective:  Joann Meyers is a 34 y.o. G1P0000 at [redacted]w[redacted]d being seen today for ongoing prenatal care.  She is currently monitored for the following issues for this high-risk pregnancy and has History of stroke; Type 2 diabetes mellitus without complication, without long-term current use of insulin ([place]); Microcytic anemia; Chest pain; Non-compliance; Obesity (BMI 30-39.9); Left-sided weakness; TIA (transient ischemic attack); Thyroid nodule; Supervision of high risk pregnancy, antepartum; Preexisting diabetes complicating pregnancy, antepartum; Seizures ([place]); Headache in pregnancy, antepartum, second trimester; 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[place]); Hypertension during pregnancy, antepartum; Obesity during pregnancy in second trimester; Gestational diabetes mellitus (GDM) affecting pregnancy, antepartum; Rubella non-immune status, antepartum; and Pregnancy affected by fetal growth restriction on their problem list.  Patient doing well with no acute concerns today. She reports no complaints.  Contractions: Not present. Vag. Bleeding: None.  Movement: Present. Denies leaking of fluid.   BP noted to be elevated today.  Per pt her BP was 120s-130s/ 90-100  At home Fasting blood sugar today was 279, yesterday was 167.  Postprandials 127-134 Pt did not bring in her recorded blood sugars.  All values are from recollection  5 units novolog before meals 12 units novulin BID  The following portions of the patient's history were reviewed and updated as appropriate: allergies, current medications, past family history, past medical history, past social history, past surgical history and problem list. Problem list updated.  Objective:   Vitals:   07/09/20 1102 07/09/20 1105  BP: (!) 150/100 (!) 156/94  Pulse: 96 96  Weight: 213 lb (96.6 kg)     Fetal Status: Fetal Heart Rate (bpm): 143   Movement: Present     General:  Alert, oriented and cooperative. Patient is in no acute  distress.  Skin: Skin is warm and dry. No rash noted.   Cardiovascular: Normal heart rate noted  Respiratory: Normal respiratory effort, no problems with respiration noted  Abdomen: Soft, gravid, appropriate for gestational age.  Pain/Pressure: Absent     Pelvic: Cervical exam deferred        Extremities: Normal range of motion.  Edema: None  Mental Status:  Normal mood and affect. Normal behavior. Normal judgment and thought content.   Assessment and Plan:  Pregnancy: G1P0000 at [redacted]w[redacted]d  1. 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[facility])   2. Pre-existing essential hypertension during pregnancy, antepartum Will increase labetalol to 400 mg BID  3. Type 2 diabetes mellitus without complication, without long-term current use of insulin ([facility]) Unsure of compliance , but FBS are well out of range  4. History of stroke   5. Non-compliance   6. Obesity (BMI 30-39.9)   7. Supervision of high risk pregnancy, antepartum   8. Pregnancy affected by fetal growth restriction Pt has weekly doppler and testing  9. Rubella non-immune status, antepartum Receive after delivery  Preterm labor symptoms and general obstetric precautions including but not limited to vaginal bleeding, contractions, leaking of fluid and fetal movement were reviewed in detail with the patient. Pt advised to got to [facility] for eval of elevated BP and elevated blood sugar, check PIH labs, serial BP and random blood sugar/Hgb A1c Please refer to After Visit Summary for other counseling recommendations.   Return in about 1 week (around 07/16/2020) for [facility], in person.   Lynnda Shields, MD

## 2020-07-09 NOTE — [facility]U Note (Signed)
rtn appt today.  BP and blood sugar was high.  Sent in for further eval. Denies HA, visual changes, epigastric pain.  Reports increase in swelling.

## 2020-07-09 NOTE — Progress Notes (Signed)
Fasting today was 279
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
## 2020-07-10 DIAGNOSIS — O24113 Pre-existing diabetes mellitus, type 2, in pregnancy, third trimester: Secondary | ICD-10-CM

## 2020-07-10 DIAGNOSIS — E1165 Type 2 diabetes mellitus with hyperglycemia: Secondary | ICD-10-CM

## 2020-07-10 DIAGNOSIS — O113 Pre-existing hypertension with pre-eclampsia, third trimester: Principal | ICD-10-CM

## 2020-07-10 DIAGNOSIS — Z794 Long term (current) use of insulin: Secondary | ICD-10-CM

## 2020-07-10 DIAGNOSIS — Z3A32 32 weeks gestation of pregnancy: Secondary | ICD-10-CM

## 2020-07-10 DIAGNOSIS — O1493 Unspecified pre-eclampsia, third trimester: Secondary | ICD-10-CM

## 2020-07-10 LAB — GLUCOSE, CAPILLARY
Glucose-Capillary: 244 mg/dL — ABNORMAL HIGH (ref 70–99)
Glucose-Capillary: 193 mg/dL — ABNORMAL HIGH (ref 70–99)
Glucose-Capillary: 149 mg/dL — ABNORMAL HIGH (ref 70–99)
Glucose-Capillary: 193 mg/dL — ABNORMAL HIGH (ref 70–99)
Glucose-Capillary: 130 mg/dL — ABNORMAL HIGH (ref 70–99)
Glucose-Capillary: 121 mg/dL — ABNORMAL HIGH (ref 70–99)

## 2020-07-10 MED ORDER — INSULIN ASPART 100 UNIT/ML ~~LOC~~ SOLN
10.0000 [IU] | Freq: Three times a day (TID) | SUBCUTANEOUS | Status: DC
  Administered 2020-07-10 – 2020-07-12 (×6): 10 [IU] via SUBCUTANEOUS

## 2020-07-10 MED ORDER — INSULIN NPH (HUMAN) (ISOPHANE) 100 UNIT/ML ~~LOC~~ SUSP
14.0000 [IU] | Freq: Two times a day (BID) | SUBCUTANEOUS | Status: DC
  Administered 2020-07-10 – 2020-07-12 (×5): 14 [IU] via SUBCUTANEOUS
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
## 2020-07-10 NOTE — Progress Notes (Signed)
Patient ID: Joann Meyers, female   DOB: January 20, 1986, 34 y.o.   MRN: [id number] [place]) NOTE  Joann Meyers is a 34 y.o. G1P0000 at [redacted]w[redacted]d by early ultrasound who is admitted for [facility] and DM management.   Fetal presentation is transverse. Length of Stay:  1  Days  Subjective: No c/o Patient reports the fetal movement as active. Patient reports uterine contraction  activity as none. Patient reports  vaginal bleeding as none. Patient describes fluid per vagina as None.  Vitals:  Blood pressure (!) 117/56, pulse 85, temperature 98.5 F (36.9 C), temperature source Oral, resp. rate 18, height 5\' 2"  (1.575 m), weight 96.6 kg, SpO2 98 %. Physical Examination:  General appearance - alert, well appearing, and in no distress Heart - normal rate and regular rhythm Abdomen - soft, nontender, nondistended Fundal Height:  size less than dates Cervical Exam: Not evaluated. Extremities: extremities normal, atraumatic, no cyanosis or edema and Homans sign is negative, no sign of DVT  Membranes:intact  Fetal Monitoring:   Fetal Heart Rate A  Mode External filed at 07/09/2020 2343  Baseline Rate (A) 135 bpm filed at 07/09/2020 2343  Variability 6-25 BPM filed at 07/09/2020 2343  Accelerations 15 x 15 filed at 07/09/2020 2343  Decelerations None filed at 07/09/2020 2343     Labs:  Results for orders placed or performed during the hospital encounter of 07/09/20 (from the past 24 hour(s))  Urinalysis, Routine w reflex microscopic Urine, Clean Catch   Collection Time: 07/09/20  1:10 PM  Result Value Ref Range   Color, Urine YELLOW YELLOW   APPearance CLOUDY (A) CLEAR   Specific Gravity, Urine 1.020 1.005 - 1.030   pH 6.0 5.0 - 8.0   Glucose, UA >=500 (A) NEGATIVE mg/dL   Hgb urine dipstick NEGATIVE NEGATIVE   Bilirubin Urine NEGATIVE NEGATIVE   Ketones, ur NEGATIVE NEGATIVE mg/dL   Protein, ur NEGATIVE NEGATIVE mg/dL   Nitrite NEGATIVE NEGATIVE    Leukocytes,Ua MODERATE (A) NEGATIVE   RBC / HPF 0-5 0 - 5 RBC/hpf   WBC, UA 11-20 0 - 5 WBC/hpf   Bacteria, UA FEW (A) NONE SEEN   Squamous Epithelial / LPF 6-10 0 - 5   Mucus PRESENT    Budding Yeast PRESENT   Protein / creatinine ratio, urine   Collection Time: 07/09/20  1:10 PM  Result Value Ref Range   Creatinine, Urine 46.62 mg/dL   Total Protein, Urine 23 mg/dL   Protein Creatinine Ratio 0.49 (H) 0.00 - 0.15 mg/mg[Cre]  CBC   Collection Time: 07/09/20  3:33 PM  Result Value Ref Range   WBC 8.0 4.0 - 10.5 K/uL   RBC 3.63 (L) 3.87 - 5.11 MIL/uL   Hemoglobin 10.2 (L) 12.0 - 15.0 g/dL   HCT 30.6 (L) 36 - 46 %   MCV 84.3 80.0 - 100.0 fL   MCH 28.1 26.0 - 34.0 pg   MCHC 33.3 30.0 - 36.0 g/dL   RDW 15.2 11.5 - 15.5 %   Platelets 133 (L) 150 - 400 K/uL   nRBC 0.0 0.0 - 0.2 %  Comprehensive metabolic panel   Collection Time: 07/09/20  3:33 PM  Result Value Ref Range   Sodium 136 135 - 145 mmol/L   Potassium 3.9 3.5 - 5.1 mmol/L   Chloride 105 98 - 111 mmol/L   CO2 22 22 - 32 mmol/L   Glucose, Bld 310 (H) 70 - 99 mg/dL   BUN 11 6 - 20 mg/dL  Creatinine, Ser 0.51 0.44 - 1.00 mg/dL   Calcium 9.0 8.9 - 10.3 mg/dL   Total Protein 5.4 (L) 6.5 - 8.1 g/dL   Albumin 2.6 (L) 3.5 - 5.0 g/dL   AST 16 15 - 41 U/L   ALT 17 0 - 44 U/L   Alkaline Phosphatase 66 38 - 126 U/L   Total Bilirubin 0.5 0.3 - 1.2 mg/dL   GFR calc non Af Amer >60 >60 mL/min   GFR calc Af Amer >60 >60 mL/min   Anion gap 9 5 - 15  Type and screen [place]   Collection Time: 07/09/20  7:13 PM  Result Value Ref Range   ABO/RH(D) O POS    Antibody Screen NEG    Sample Expiration      07/12/2020,2359 Performed at [facility], [place] [address], [place], [place] [address]   Respiratory Panel by RT PCR (Flu A&B, Covid) - Nasopharyngeal Swab   Collection Time: 07/09/20  7:54 PM   Specimen: Nasopharyngeal Swab  Result Value Ref Range   SARS Coronavirus 2 by RT PCR NEGATIVE NEGATIVE    Influenza A by PCR NEGATIVE NEGATIVE   Influenza B by PCR NEGATIVE NEGATIVE  Glucose, capillary   Collection Time: 07/09/20  9:37 PM  Result Value Ref Range   Glucose-Capillary 167 (H) 70 - 99 mg/dL  Glucose, capillary   Collection Time: 07/10/20  6:44 AM  Result Value Ref Range   Glucose-Capillary 244 (H) 70 - 99 mg/dL     Medications:  Scheduled  aspirin EC  162 mg Oral Daily   docusate sodium  100 mg Oral Daily   insulin aspart  0-24 Units Subcutaneous TID PC   insulin aspart  5 Units Subcutaneous TID AC   insulin NPH Human  14 Units Subcutaneous BID AC & HS   labetalol  400 mg Oral BID   pantoprazole  20 mg Oral Daily   prenatal multivitamin  1 tablet Oral Q1200   sodium chloride flush  3 mL Intravenous Q12H   I have reviewed the patient's current medications.  ASSESSMENT: Patient Active Problem List   Diagnosis Date Noted   Type 2 diabetes mellitus during pregnancy 07/09/2020   Rubella non-immune status, antepartum 05/20/2020   Pregnancy affected by fetal growth restriction 05/20/2020   Gestational diabetes mellitus (GDM) affecting pregnancy, antepartum 05/06/2020   Headache in pregnancy, antepartum, second trimester 03/24/2020   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[place]) 03/24/2020   Hypertension during pregnancy, antepartum 03/24/2020   Obesity during pregnancy in second trimester 03/24/2020   Supervision of high risk pregnancy, antepartum 02/04/2020   Preexisting diabetes complicating pregnancy, antepartum 02/04/2020   Seizures ([place])    Left-sided weakness 09/21/2018   TIA (transient ischemic attack) 09/21/2018   Thyroid nodule 09/21/2018   Chest pain 07/22/2017   Non-compliance 07/22/2017   Obesity (BMI 30-39.9) 07/22/2017   Microcytic anemia 03/13/2017   History of stroke 03/03/2017   Type 2 diabetes mellitus without complication, without long-term current use of insulin ([place]) 03/03/2017    PLAN: Needs adjustment of insulin therapy, SSI  now, DM coordinator consulted. Will consider BMZ  Emeterio Reeve 07/10/2020,7:31 AM

## 2020-07-10 NOTE — Progress Notes (Signed)
Inpatient Diabetes Program Recommendations  AACE/ADA: New Consensus Statement on Inpatient Glycemic Control (2015)  Target Ranges:  Prepandial:   less than 140 mg/dL      Peak postprandial:   less than 180 mg/dL (1-2 hours)      Critically ill patients:  140 - 180 mg/dL   Lab Results  Component Value Date   GLUCAP 149 (H) 07/10/2020   HGBA1C 6.5 (H) 05/06/2020    Review of Glycemic Control  Diabetes history: DM2 Outpatient Diabetes medications: NPH 12 units bid, Novolog 5 units tidwc - was on metformin and glyburide  Current orders for Inpatient glycemic control: NPH 14 units bid, Novolog 10 units tidwc + 0-24 units tid after meals.  HgbA1C - 6.5%  Inpatient Diabetes Program Recommendations:     Agree with orders.  Will f/u on 9/27.  Thank you. Lorenda Peck, RD, LDN, CDE Inpatient Diabetes Coordinator [phone]

## 2020-07-11 LAB — GLUCOSE, CAPILLARY
Glucose-Capillary: 104 mg/dL — ABNORMAL HIGH (ref 70–99)
Glucose-Capillary: 126 mg/dL — ABNORMAL HIGH (ref 70–99)
Glucose-Capillary: 180 mg/dL — ABNORMAL HIGH (ref 70–99)
Glucose-Capillary: 159 mg/dL — ABNORMAL HIGH (ref 70–99)
Glucose-Capillary: 183 mg/dL — ABNORMAL HIGH (ref 70–99)
Glucose-Capillary: 200 mg/dL — ABNORMAL HIGH (ref 70–99)

## 2020-07-11 MED ORDER — DIPHENHYDRAMINE HCL 25 MG PO CAPS
25.0000 mg | ORAL_CAPSULE | Freq: Four times a day (QID) | ORAL | Status: DC | PRN
  Administered 2020-07-11: 25 mg via ORAL
  Filled 2020-07-11: qty 1

## 2020-07-11 NOTE — Progress Notes (Signed)
Patient ID: Prisca Gearing, female   DOB: Jan 26, 1986, 34 y.o.   MRN: [id number] [place]) NOTE  Yasmeen Manka is a 34 y.o. G1P0000 at [redacted]w[redacted]d by early ultrasound who is admitted for [facility] and DM management.   Fetal presentation is transverse. Length of Stay:  2  Days  Subjective: No c/o, denies HA, RUQ pain vision changes Patient reports the fetal movement as active. Patient reports uterine contraction  activity as none. Patient reports  vaginal bleeding as none. Patient describes fluid per vagina as None.  Vitals:  Blood pressure 120/74, pulse 83, temperature 98.2 F (36.8 C), temperature source Oral, resp. rate 18, height 5\' 2"  (1.575 m), weight 96.6 kg, SpO2 99 %. Physical Examination:  General appearance - alert, well appearing, and in no distress Heart - normal rate and regular rhythm Abdomen - soft, nontender, nondistended Fundal Height:  size less than dates Cervical Exam: Not evaluated. Extremities: extremities normal, atraumatic, no cyanosis or edema and Homans sign is negative, no sign of DVT  Membranes:intact  Fetal Monitoring:   Fetal Heart Rate A  Mode External filed at 07/09/2020 2343  Baseline Rate (A) 135 bpm filed at 07/09/2020 2343  Variability 6-25 BPM filed at 07/09/2020 2343  Accelerations 15 x 15 filed at 07/09/2020 2343  Decelerations None filed at 07/09/2020 2343     Labs:  Results for orders placed or performed during the hospital encounter of 07/09/20 (from the past 24 hour(s))  Glucose, capillary   Collection Time: 07/10/20  8:49 AM  Result Value Ref Range   Glucose-Capillary 193 (H) 70 - 99 mg/dL  Glucose, capillary   Collection Time: 07/10/20 12:10 PM  Result Value Ref Range   Glucose-Capillary 149 (H) 70 - 99 mg/dL  Glucose, capillary   Collection Time: 07/10/20  4:40 PM  Result Value Ref Range   Glucose-Capillary 193 (H) 70 - 99 mg/dL  Glucose, capillary   Collection Time: 07/10/20  8:11 PM  Result Value  Ref Range   Glucose-Capillary 130 (H) 70 - 99 mg/dL  Glucose, capillary   Collection Time: 07/10/20 10:19 PM  Result Value Ref Range   Glucose-Capillary 121 (H) 70 - 99 mg/dL  Glucose, capillary   Collection Time: 07/11/20  2:16 AM  Result Value Ref Range   Glucose-Capillary 104 (H) 70 - 99 mg/dL     Medications:  Scheduled . aspirin EC  162 mg Oral Daily  . docusate sodium  100 mg Oral Daily  . insulin aspart  0-24 Units Subcutaneous TID PC  . insulin aspart  10 Units Subcutaneous TID AC  . insulin NPH Human  14 Units Subcutaneous BID AC & HS  . labetalol  400 mg Oral BID  . pantoprazole  20 mg Oral Daily  . prenatal multivitamin  1 tablet Oral Q1200  . sodium chloride flush  3 mL Intravenous Q12H   I have reviewed the patient's current medications.  ASSESSMENT: Patient Active Problem List   Diagnosis Date Noted  . Type 2 diabetes mellitus during pregnancy 07/09/2020  . Rubella non-immune status, antepartum 05/20/2020  . Pregnancy affected by fetal growth restriction 05/20/2020  . Gestational diabetes mellitus (GDM) affecting pregnancy, antepartum 05/06/2020  . Headache in pregnancy, antepartum, second trimester 03/24/2020  . 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 Hypertension during pregnancy, antepartum 03/24/2020  . Obesity during pregnancy in second trimester 03/24/2020  . Supervision of high risk pregnancy, antepartum 02/04/2020  . Preexisting diabetes complicating pregnancy, antepartum 02/04/2020  . Seizures ([place])   .  Left-sided weakness 09/21/2018  . TIA (transient ischemic attack) 09/21/2018  . Thyroid nodule 09/21/2018  . Chest pain 07/22/2017  . Non-compliance 07/22/2017  . Obesity (BMI 30-39.9) 07/22/2017  . Microcytic anemia 03/13/2017  . History of stroke 03/03/2017  . Type 2 diabetes mellitus without complication, without long-term current use of insulin ([place]) 03/03/2017    PLAN: Uncontrolled Type II: adjusted regimen to NPH 14/14, Nlog  07/25/09. SSI to cover. Last blood glucose 104.   CHTN: asx, BP stable no meds, hold BMZ Dispo: continue glucose optimization.   Cherre Blanc 07/11/2020,7:54 AM

## 2020-07-12 DIAGNOSIS — O1414 Severe pre-eclampsia complicating childbirth: Secondary | ICD-10-CM | POA: Diagnosis present

## 2020-07-12 DIAGNOSIS — O10913 Unspecified pre-existing hypertension complicating pregnancy, third trimester: Secondary | ICD-10-CM

## 2020-07-12 DIAGNOSIS — O1403 Mild to moderate pre-eclampsia, third trimester: Secondary | ICD-10-CM

## 2020-07-12 DIAGNOSIS — E119 Type 2 diabetes mellitus without complications: Secondary | ICD-10-CM

## 2020-07-12 DIAGNOSIS — Z9114 Patient's other noncompliance with medication regimen: Secondary | ICD-10-CM

## 2020-07-12 LAB — COMPREHENSIVE METABOLIC PANEL
ALT: 18 U/L (ref 0–44)
AST: 18 U/L (ref 15–41)
Albumin: 2.8 g/dL — ABNORMAL LOW (ref 3.5–5.0)
Alkaline Phosphatase: 61 U/L (ref 38–126)
Anion gap: 10 (ref 5–15)
BUN: 10 mg/dL (ref 6–20)
CO2: 20 mmol/L — ABNORMAL LOW (ref 22–32)
Calcium: 9.2 mg/dL (ref 8.9–10.3)
Chloride: 107 mmol/L (ref 98–111)
Creatinine, Ser: 0.54 mg/dL (ref 0.44–1.00)
GFR calc Af Amer: >60 mL/min (ref >60)
GFR calc non Af Amer: >60 mL/min (ref >60)
Glucose, Bld: 190 mg/dL — ABNORMAL HIGH (ref 70–99)
Potassium: 3.9 mmol/L (ref 3.5–5.1)
Sodium: 137 mmol/L (ref 135–145)
Total Bilirubin: 0.4 mg/dL (ref 0.3–1.2)
Total Protein: 6 g/dL — ABNORMAL LOW (ref 6.5–8.1)

## 2020-07-12 LAB — GLUCOSE, CAPILLARY
Glucose-Capillary: 145 mg/dL — ABNORMAL HIGH (ref 70–99)
Glucose-Capillary: 187 mg/dL — ABNORMAL HIGH (ref 70–99)
Glucose-Capillary: 165 mg/dL — ABNORMAL HIGH (ref 70–99)
Glucose-Capillary: 147 mg/dL — ABNORMAL HIGH (ref 70–99)
Glucose-Capillary: 167 mg/dL — ABNORMAL HIGH (ref 70–99)

## 2020-07-12 LAB — CBC
HCT: 33.9 % — ABNORMAL LOW (ref 36.0–46.0)
Hemoglobin: 11.3 g/dL — ABNORMAL LOW (ref 12.0–15.0)
MCH: 28.7 pg (ref 26.0–34.0)
MCHC: 33.3 g/dL (ref 30.0–36.0)
MCV: 86 fL (ref 80.0–100.0)
Platelets: 164 10*3/uL (ref 150–400)
RBC: 3.94 MIL/uL (ref 3.87–5.11)
RDW: 15.2 % (ref 11.5–15.5)
WBC: 8.4 10*3/uL (ref 4.0–10.5)
nRBC: 0 % (ref 0.0–0.2)

## 2020-07-12 LAB — HEMOGLOBIN A1C
Hgb A1c MFr Bld: 7.5 % — ABNORMAL HIGH (ref 4.8–5.6)
Mean Plasma Glucose: 168.55 mg/dL

## 2020-07-12 LAB — VITAMIN D 25 HYDROXY (VIT D DEFICIENCY, FRACTURES): Vit D, 25-Hydroxy: 17.88 ng/mL — ABNORMAL LOW (ref 30–100)

## 2020-07-12 MED ORDER — INSULIN ASPART 100 UNIT/ML ~~LOC~~ SOLN
14.0000 [IU] | Freq: Three times a day (TID) | SUBCUTANEOUS | Status: DC
  Administered 2020-07-12 – 2020-07-13 (×2): 14 [IU] via SUBCUTANEOUS

## 2020-07-12 MED ORDER — DOCUSATE SODIUM 100 MG PO CAPS: 100.0000 mg | ORAL_CAPSULE | Freq: Two times a day (BID) | ORAL | Status: DC | PRN

## 2020-07-12 MED ORDER — INSULIN ASPART 100 UNIT/ML ~~LOC~~ SOLN
12.0000 [IU] | Freq: Three times a day (TID) | SUBCUTANEOUS | Status: DC
  Administered 2020-07-12: 12 [IU] via SUBCUTANEOUS

## 2020-07-12 MED ORDER — SALINE SPRAY 0.65 % NA SOLN
1.0000 | NASAL | Status: DC | PRN
  Administered 2020-07-12: 1 via NASAL
  Filled 2020-07-12: qty 44

## 2020-07-12 MED ORDER — INSULIN NPH (HUMAN) (ISOPHANE) 100 UNIT/ML ~~LOC~~ SUSP
17.0000 [IU] | Freq: Two times a day (BID) | SUBCUTANEOUS | Status: DC
  Administered 2020-07-12 – 2020-07-13 (×2): 17 [IU] via SUBCUTANEOUS

## 2020-07-12 NOTE — Progress Notes (Signed)
Daily Antepartum Note  Admission Date: 07/09/2020 Current Date: 07/12/2020 9:33 AM  Joann Meyers is a 34 y.o. G1P0000 @ [redacted]w[redacted]d, HD#4, admitted for BP and blood sugar control.  Pregnancy complicated by: Patient Active Problem List   Diagnosis Date Noted  . Type 2 diabetes mellitus during pregnancy 07/09/2020  . Rubella non-immune status, antepartum 05/20/2020  . Pregnancy affected by fetal growth restriction 05/20/2020  . Gestational diabetes mellitus (GDM) affecting pregnancy, antepartum 05/06/2020  . Headache in pregnancy, antepartum, second trimester 03/24/2020  . 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 Hypertension during pregnancy, antepartum 03/24/2020  . Obesity during pregnancy in second trimester 03/24/2020  . Supervision of high risk pregnancy, antepartum 02/04/2020  . Preexisting diabetes complicating pregnancy, antepartum 02/04/2020  . Seizures ([place])   . Left-sided weakness 09/21/2018  . TIA (transient ischemic attack) 09/21/2018  . Thyroid nodule 09/21/2018  . Chest pain 07/22/2017  . Non-compliance 07/22/2017  . Obesity (BMI 30-39.9) 07/22/2017  . Microcytic anemia 03/13/2017  . History of stroke 03/03/2017  . Type 2 diabetes mellitus without complication, without long-term current use of insulin ([place]) 03/03/2017    Overnight/24hr events:  none  Subjective:  No s/s of pre-eclampsia, decreased fetal movement, labor s/s  Objective:    Current Vital Signs 24h Vital Sign Ranges  T 98.5 F (36.9 C) Temp  Avg: 98.2 F (36.8 C)  Min: 98 F (36.7 C)  Max: 98.5 F (36.9 C)  BP (!) 125/56 BP  Min: 121/63  Max: 149/77  HR 79 Pulse  Avg: 79.6  Min: 74  Max: 84  RR 18 Resp  Avg: 17.7  Min: 16  Max: 18  SaO2 97 %  (Room Air) SpO2  Avg: 98.4 %  Min: 97 %  Max: 100 %       24 Hour I/O Current Shift I/O  Time Ins Outs No intake/output data recorded. No intake/output data recorded.   Patient Vitals for the past 24 hrs:  BP Temp Temp src Pulse Resp SpO2   07/12/20 0739 (!) 125/56 98.5 F (36.9 C) Oral 79 18 97 %  07/12/20 0529 136/77 -- -- 80 18 99 %  07/12/20 0528 -- 98.1 F (36.7 C) Oral -- -- --  07/11/20 2320 138/71 -- -- 78 -- 100 %  07/11/20 2319 -- 98.3 F (36.8 C) Oral -- 16 --  07/11/20 2235 130/66 -- -- 74 -- --  07/11/20 1544 121/63 98.2 F (36.8 C) Oral 81 18 99 %  07/11/20 1113 127/70 98 F (36.7 C) Oral 81 18 99 %  07/11/20 1038 (!) 149/77 -- -- 84 18 --  07/11/20 1020 -- -- -- -- -- 98 %  07/11/20 1015 -- -- -- -- -- 97 %  07/11/20 1010 -- -- -- -- -- 99 %  07/11/20 1005 -- -- -- -- -- 97 %  07/11/20 1000 -- -- -- -- -- 98 %  07/11/20 0955 -- -- -- -- -- 98 %  07/11/20 0950 -- -- -- -- -- 98 %  07/11/20 0940 -- -- -- -- -- 99 %  07/11/20 0935 -- -- -- -- -- 99 %   FHT: 130 baseline, +accels, no decel, mod variability Toco: quiet x 40 minutes  Physical exam: General: Well nourished, well developed female in no acute distress. Abdomen: gravid nttp Cardiovascular: S1, S2 normal, no murmur, rub or gallop, regular rate and rhythm Respiratory: CTAB Extremities: no clubbing, cyanosis or edema Skin: Warm and dry.  Neuro: 1+ brachial  Medications: Current Facility-Administered Medications  Medication Dose Route Frequency Provider Last Rate Last Admin  . 0.9 %  sodium chloride infusion  250 mL Intravenous PRN Woodroe Mode, MD      . acetaminophen (TYLENOL) tablet 650 mg  650 mg Oral Q4H PRN Woodroe Mode, MD   650 mg at 07/09/20 2345  . aspirin EC tablet 162 mg  162 mg Oral Daily Woodroe Mode, MD   162 mg at 07/12/20 4098  . calcium carbonate (TUMS - dosed in mg elemental calcium) chewable tablet 400 mg of elemental calcium  2 tablet Oral Q4H PRN Woodroe Mode, MD      . diphenhydrAMINE (BENADRYL) capsule 25 mg  25 mg Oral Q6H PRN Cherre Blanc, MD   25 mg at 07/11/20 0028  . docusate sodium (COLACE) capsule 100 mg  100 mg Oral Daily Woodroe Mode, MD   100 mg at 07/12/20 1191  . insulin aspart  (novoLOG) injection 0-24 Units  0-24 Units Subcutaneous TID PC Woodroe Mode, MD   6 Units at 07/11/20 2033  . insulin aspart (novoLOG) injection 10 Units  10 Units Subcutaneous TID AC Cherre Blanc, MD   10 Units at 07/12/20 [phone]  . insulin NPH Human (NOVOLIN N) injection 14 Units  14 Units Subcutaneous BID AC & HS Woodroe Mode, MD   14 Units at 07/12/20 0836  . labetalol (NORMODYNE) tablet 400 mg  400 mg Oral BID Woodroe Mode, MD   400 mg at 07/12/20 9562  . pantoprazole (PROTONIX) EC tablet 20 mg  20 mg Oral Daily Woodroe Mode, MD   20 mg at 07/12/20 1308  . prenatal multivitamin tablet 1 tablet  1 tablet Oral Q1200 Woodroe Mode, MD   1 tablet at 07/11/20 1039  . sodium chloride flush (NS) 0.9 % injection 3 mL  3 mL Intravenous Q12H Woodroe Mode, MD   3 mL at 07/11/20 2238  . sodium chloride flush (NS) 0.9 % injection 3 mL  3 mL Intravenous PRN Woodroe Mode, MD      . zolpidem (AMBIEN) tablet 5 mg  5 mg Oral QHS PRN Woodroe Mode, MD        Labs:  Recent Labs  Lab 07/09/20 1533  WBC 8.0  HGB 10.2*  HCT 30.6*  PLT 133*    Recent Labs  Lab 07/09/20 1533  NA 136  K 3.9  CL 105  CO2 22  BUN 11  CREATININE 0.51  CALCIUM 9.0  PROT 5.4*  BILITOT 0.5  ALKPHOS 66  ALT 17  AST 16  GLUCOSE 310*   Results for MALENA, TIMPONE (MRN [id number]) as of 07/12/2020 12:42  Ref. Range 07/11/2020 20:27 07/11/2020 22:37 07/12/2020 07:53 07/12/2020 10:32  Glucose-Capillary Latest Ref Range: 70 - 99 mg/dL 183 (H) 200 (H) 145 (H) 187 (H)    Radiology:  No new imaging 9/23: bpp 10/10, normal UA dopplers 9/9: EFW 3%, AC 34%, 1175gm, normal dopplers Assessment & Plan:  Pt stable *Pregnancy: reactive NST.  *cHTN: pt with PC ratio in the 400s on admission and negative in the past, so I would dx her with mild pre-eclampsia. Doing well on increased labetalol *DM2: poor compliance. I had a long talk with her. She is currently on a diabetic diet, but she states she does not  adhere to one at home. She also states that, at home, she eats one meal at 10am and doesn't snack. Will  change to regular diet and I encouraged to do 10am and 1800 for her meals. Will increase her insultion from nph 14/14, aspart 10 tidac to 17/17, 12tidac *Asymmetric FGR: continue with qshift NSTs. Has rpt UA dopplers and bpp on Thursday. D/w her and mfm re: if she is stable to go home since her BPs and sugars always increase when she is discharged *Preterm: hasn't had a course of BMZ during the pregnancy *Vascular: last seen by them on 04/06/20. I will inbasket her MD to make sure she's fine for trial of labor, pushing.  *PPx: SCDs, OOB ad lib *FEN/GI: change to regular diet. SCDs *Dispo: based on dopplers, BPs and CBGs on Thursday.   Durene Romans MD Attending [facility] for [place] (Faculty Practice) GYN Consult Phone: [phone] (M-F, [phone]) & [phone] (Off hours, weekends, holidays)

## 2020-07-12 NOTE — Progress Notes (Addendum)
Inpatient Diabetes Program Recommendations  ADA Standards of Care 2021 Diabetes in Pregnancy Target Glucose Ranges:  Fasting: 60 - 90 mg/dL Preprandial: 60 - 105 mg/dL 1 hr postprandial: Less than 140mg /dL (from first bite of meal) 2 hr postprandial: Less than 120 mg/dL (from first bit of meal)    Lab Results  Component Value Date   GLUCAP 145 (H) 07/12/2020   HGBA1C 6.5 (H) 05/06/2020    Review of Glycemic Control Results for RHANDI, DESPAIN (MRN [id number]) as of 07/12/2020 09:29  Ref. Range 07/11/2020 09:45 07/11/2020 12:17 07/11/2020 16:29 07/11/2020 20:27 07/11/2020 22:37 07/12/2020 07:53  Glucose-Capillary Latest Ref Range: 70 - 99 mg/dL 126 (H) 180 (H) 159 (H) 183 (H) 200 (H) 145 (H)   Diabetes history:  DM2 Outpatient Diabetes medications:  NPH 12 units BID Novolog 5 units tid with meals Current orders for Inpatient glycemic control:  NPH 14 units BID Novolog 10 units TID with meals Novolog 0-24 units tid after meals  Inpatient Diabetes Program Recommendations:     NPH 15 units BID Novolog 14 units TID with meals  Will speak with patient today.  Addendum @ 1429: Spoke with patient at bedside.  She verifies current above home insulins.  Prior to pregnancy she states she was prescribed Metformin 80 mg daily.  She was diagnosed with DM2 in 2016.    She states she only eats 1-2 meals a day.  She eats scrambled eggs and a wheat bun for breakfast most days and if she is [place] at dinner she will eat rice and chicken.  Reviewed recommended CHO goals per meal for the pregnant patient.  Reviewed fasting goals of less than 90 mg/dL and 2 hr. postprandials of less than 120 mg/dL.  She says she checks her CBG's fasting, 1pm and 6 pm.  She states her fasting is usually 95-100 mg/dL, lunch 125 mg/dL and dinner, 160 mg/dL   She obtains her insulins without difficulty and administers as prescribed.    Reviewed long and short term complications of glucose levels above goal.   Dr.  Ilda Basset has increased NPH to 17 units BID and Novolog 14 units tid before meals.    Explained the DM team will be watching her glucose levels closely and will make recommendations inpatient and at discharge.    Will continue to follow while inpatient.  Thank you, Reche Dixon, RN, BSN Diabetes Coordinator Inpatient Diabetes Program [phone] (team pager from 8a-5p)

## 2020-07-12 NOTE — Progress Notes (Signed)
Pt with a small nose bleeds. BP this morning WNL. Will continue to monitor. Toya Smothers, RN

## 2020-07-13 DIAGNOSIS — R7989 Other specified abnormal findings of blood chemistry: Secondary | ICD-10-CM

## 2020-07-13 HISTORY — DX: Other specified abnormal findings of blood chemistry: R79.89

## 2020-07-13 LAB — GLUCOSE, CAPILLARY
Glucose-Capillary: 114 mg/dL — ABNORMAL HIGH (ref 70–99)
Glucose-Capillary: 171 mg/dL — ABNORMAL HIGH (ref 70–99)
Glucose-Capillary: 238 mg/dL — ABNORMAL HIGH (ref 70–99)
Glucose-Capillary: 231 mg/dL — ABNORMAL HIGH (ref 70–99)
Glucose-Capillary: 255 mg/dL — ABNORMAL HIGH (ref 70–99)

## 2020-07-13 MED ORDER — LIDOCAINE VISCOUS HCL 2 % MT SOLN
15.0000 mL | Freq: Once | OROMUCOSAL | Status: AC
  Administered 2020-07-13: 15 mL via ORAL
  Filled 2020-07-13: qty 15

## 2020-07-13 MED ORDER — VITAMIN D (ERGOCALCIFEROL) 1.25 MG (50000 UNIT) PO CAPS
50000.0000 [IU] | ORAL_CAPSULE | ORAL | Status: DC
  Administered 2020-07-13: 50000 [IU] via ORAL
  Filled 2020-07-13: qty 1

## 2020-07-13 MED ORDER — INSULIN NPH (HUMAN) (ISOPHANE) 100 UNIT/ML ~~LOC~~ SUSP
20.0000 [IU] | Freq: Two times a day (BID) | SUBCUTANEOUS | Status: DC
  Administered 2020-07-13 – 2020-07-14 (×2): 20 [IU] via SUBCUTANEOUS

## 2020-07-13 MED ORDER — ALUM & MAG HYDROXIDE-SIMETH 200-200-20 MG/5ML PO SUSP
30.0000 mL | Freq: Once | ORAL | Status: AC
  Administered 2020-07-13: 30 mL via ORAL
  Filled 2020-07-13: qty 30

## 2020-07-13 MED ORDER — INSULIN ASPART 100 UNIT/ML ~~LOC~~ SOLN
16.0000 [IU] | Freq: Three times a day (TID) | SUBCUTANEOUS | Status: DC
  Administered 2020-07-13 – 2020-07-14 (×3): 16 [IU] via SUBCUTANEOUS

## 2020-07-13 NOTE — Progress Notes (Signed)
Inpatient Diabetes Program Recommendations  Diabetes Treatment Program Recommendations  ADA Standards of Care 2018 Diabetes in Pregnancy Target Glucose Ranges:  Fasting: 60 - 90 mg/dL Preprandial: 60 - 105 mg/dL 1 hr postprandial: Less than 140mg /dL (from first bite of meal) 2 hr postprandial: Less than 120 mg/dL (from first bite of meal)    Lab Results  Component Value Date   GLUCAP 114 (H) 07/13/2020   HGBA1C 7.5 (H) 07/12/2020    Review of Glycemic Control Results for Joann Meyers, Joann Meyers (MRN [id number]) as of 07/13/2020 09:28  Ref. Range 07/12/2020 14:32 07/12/2020 20:04 07/12/2020 21:42 07/13/2020 07:39  Glucose-Capillary Latest Ref Range: 70 - 99 mg/dL 165 (H) 147 (H) 167 (H) 114 (H)   Diabetes history:  DM2 Outpatient Diabetes medications:  NPH 12 units BID Novolog 5 units tid with meals Current orders for Inpatient glycemic control:  NPH 17 units BID Novolog 14 units TID with meals Novolog 0-24 units tid after meals  Inpatient Diabetes Program Recommendations:     Consider increasing NPH to 20 units BID and increasing meal coverage to Novolog 16 units TID (assuming patient is consuming >50% of meal).   Thanks, Bronson Curb, MSN, RNC-OB Diabetes Coordinator [phone] (8a-5p)

## 2020-07-13 NOTE — Progress Notes (Addendum)
Daily Antepartum Note  Admission Date: 07/09/2020 Current Date: 07/13/2020 11:17 AM  Joann Meyers is a 34 y.o. G1P0000 @ [redacted]w[redacted]d, HD#5, admitted for BP and blood sugar control.  Pregnancy complicated by: Patient Active Problem List   Diagnosis Date Noted  . Mild pre-eclampsia 07/12/2020  . Type 2 diabetes mellitus during pregnancy 07/09/2020  . Rubella non-immune status, antepartum 05/20/2020  . Pregnancy affected by fetal growth restriction 05/20/2020  . Gestational diabetes mellitus (GDM) affecting pregnancy, antepartum 05/06/2020  . Headache in pregnancy, antepartum, second trimester 03/24/2020  . 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 Hypertension during pregnancy, antepartum 03/24/2020  . Obesity during pregnancy in second trimester 03/24/2020  . Supervision of high risk pregnancy, antepartum 02/04/2020  . Preexisting diabetes complicating pregnancy, antepartum 02/04/2020  . Seizures ([place])   . Left-sided weakness 09/21/2018  . TIA (transient ischemic attack) 09/21/2018  . Thyroid nodule 09/21/2018  . Chest pain 07/22/2017  . Non-compliance 07/22/2017  . Obesity (BMI 30-39.9) 07/22/2017  . Microcytic anemia 03/13/2017  . History of stroke 03/03/2017  . Type 2 diabetes mellitus without complication, without long-term current use of insulin ([place]) 03/03/2017    Overnight/24hr events:  none  Subjective:  No s/s of pre-eclampsia, decreased fetal movement, labor s/s She does note occasional substernal chest discomfort that comes and goes and started last night. No sob, GERD s/s.   Objective:    Current Vital Signs 24h Vital Sign Ranges  T 98.1 F (36.7 C) Temp  Avg: 98.2 F (36.8 C)  Min: 97.9 F (36.6 C)  Max: 98.3 F (36.8 C)  BP 125/69 BP  Min: 111/66  Max: 146/86  HR 74 Pulse  Avg: 76.3  Min: 74  Max: 80  RR 16 Resp  Avg: 18.3  Min: 16  Max: 20  SaO2 100 %  (Room AIr) SpO2  Avg: 99.1 %  Min: 98 %  Max: 100 %       24 Hour I/O Current Shift I/O   Time Ins Outs 09/27 0701 - 09/28 0700 In: 3 [I.V.:3] Out: -  No intake/output data recorded.   Patient Vitals for the past 24 hrs:  BP Temp Temp src Pulse Resp SpO2  07/13/20 0742 125/69 98.1 F (36.7 C) Oral 74 16 100 %  07/13/20 0400 111/66 97.9 F (36.6 C) Oral 77 18 100 %  07/13/20 0228 (!) 141/74 -- -- 75 -- 99 %  07/12/20 2343 129/73 98.1 F (36.7 C) Oral 75 20 99 %  07/12/20 2006 133/78 98.3 F (36.8 C) Oral 75 20 100 %  07/12/20 1600 124/66 98.2 F (36.8 C) Oral 78 18 98 %  07/12/20 1128 (!) 146/86 98.3 F (36.8 C) Oral 80 18 98 %   FHT: 135 baseline, +accels, no decel, mod variability Toco: quiet x 40 minutes  Physical exam: General: Well nourished, well developed female in no acute distress. Abdomen: gravid nttp Cardiovascular: S1, S2 normal, no murmur, rub or gallop, regular rate and rhythm Respiratory: CTAB. Chest pain not reproduced with palpation Extremities: no clubbing, cyanosis or edema Skin: Warm and dry.  Neuro: 1+ brachial   Medications: Current Facility-Administered Medications  Medication Dose Route Frequency Provider Last Rate Last Admin  . 0.9 %  sodium chloride infusion  250 mL Intravenous PRN Woodroe Mode, MD      . acetaminophen (TYLENOL) tablet 650 mg  650 mg Oral Q4H PRN Woodroe Mode, MD   650 mg at 07/13/20 1009  . alum & mag hydroxide-simeth (  MAALOX/MYLANTA) 200-200-20 MG/5ML suspension 30 mL  30 mL Oral Once Aletha Halim, MD       And  . lidocaine (XYLOCAINE) 2 % viscous mouth solution 15 mL  15 mL Oral Once Aletha Halim, MD      . aspirin EC tablet 162 mg  162 mg Oral Daily Woodroe Mode, MD   162 mg at 07/13/20 0953  . calcium carbonate (TUMS - dosed in mg elemental calcium) chewable tablet 400 mg of elemental calcium  2 tablet Oral Q4H PRN Woodroe Mode, MD      . diphenhydrAMINE (BENADRYL) capsule 25 mg  25 mg Oral Q6H PRN Cherre Blanc, MD   25 mg at 07/11/20 0028  . docusate sodium (COLACE) capsule 100 mg  100  mg Oral BID PRN Aletha Halim, MD      . insulin aspart (novoLOG) injection 0-24 Units  0-24 Units Subcutaneous TID PC Woodroe Mode, MD   6 Units at 07/13/20 [phone]  . insulin aspart (novoLOG) injection 14 Units  14 Units Subcutaneous TID Melvern Sample, MD   14 Units at 07/13/20 0755  . insulin NPH Human (NOVOLIN N) injection 17 Units  17 Units Subcutaneous BID AC & HS Aletha Halim, MD   17 Units at 07/13/20 0755  . labetalol (NORMODYNE) tablet 400 mg  400 mg Oral BID Woodroe Mode, MD   400 mg at 07/13/20 0953  . pantoprazole (PROTONIX) EC tablet 20 mg  20 mg Oral Daily Woodroe Mode, MD   20 mg at 07/13/20 0953  . prenatal multivitamin tablet 1 tablet  1 tablet Oral Q1200 Woodroe Mode, MD   1 tablet at 07/11/20 1039  . sodium chloride (OCEAN) 0.65 % nasal spray 1 spray  1 spray Each Nare PRN Aletha Halim, MD   1 spray at 07/12/20 1137  . sodium chloride flush (NS) 0.9 % injection 3 mL  3 mL Intravenous Q12H Woodroe Mode, MD   3 mL at 07/12/20 2156  . sodium chloride flush (NS) 0.9 % injection 3 mL  3 mL Intravenous PRN Woodroe Mode, MD      . zolpidem [facility]) tablet 5 mg  5 mg Oral QHS PRN Woodroe Mode, MD        Labs:  Recent Labs  Lab 07/09/20 1533 07/12/20 1041  WBC 8.0 8.4  HGB 10.2* 11.3*  HCT 30.6* 33.9*  PLT 133* 164    Recent Labs  Lab 07/09/20 1533 07/12/20 1041  NA 136 137  K 3.9 3.9  CL 105 107  CO2 22 20*  BUN 11 10  CREATININE 0.51 0.54  CALCIUM 9.0 9.2  PROT 5.4* 6.0*  BILITOT 0.5 0.4  ALKPHOS 66 61  ALT 17 18  AST 16 18  GLUCOSE 310* 190*   Results for KINNEDY, MONGIELLO (MRN [id number]) as of 07/13/2020 12:13  Ref. Range 07/12/2020 14:32 07/12/2020 20:04 07/12/2020 21:42 07/13/2020 07:39 07/13/2020 09:52  Glucose-Capillary Latest Ref Range: 70 - 99 mg/dL 165 (H) 147 (H) 167 (H) 114 (H) 171 (H)   Radiology:  No new imaging 9/23: bpp 10/10, normal UA dopplers 9/9: EFW 3%, AC 34%, 1175gm, normal dopplers Assessment & Plan:   Pt stable *Pregnancy: reactive NST.  Gi cocktail, if no improvement consider work up [employer] with mild pre-eclampsia (+PCR): Doing well on increased labetalol *DM2: poor compliance at home. Continue with CBG titration *Asymmetric FGR: continue with qshift NSTs. Has rpt UA dopplers and bpp on Thursday.  D/w her and mfm re: if she is stable to go home since her BPs and sugars always increase when she is discharged *Preterm: hasn't had a course of BMZ during the pregnancy *Vascular: last seen by them on 04/06/20. Inbasket message sent to MD to make sure she's fine for trial of labor, pushing.  *PPx: SCDs, OOB ad lib *FEN/GI: change to regular diet. SCDs *Dispo: based on dopplers, BPs and CBGs on Thursday.   Durene Romans MD Attending [facility] for [place] (Faculty Practice) GYN Consult Phone: [phone] (M-F, [phone]) & [phone] (Off hours, weekends, holidays)

## 2020-07-14 ENCOUNTER — Encounter: Payer: Self-pay | Admitting: Obstetrics and Gynecology

## 2020-07-14 LAB — GLUCOSE, CAPILLARY
Glucose-Capillary: 139 mg/dL — ABNORMAL HIGH (ref 70–99)
Glucose-Capillary: 158 mg/dL — ABNORMAL HIGH (ref 70–99)
Glucose-Capillary: 175 mg/dL — ABNORMAL HIGH (ref 70–99)
Glucose-Capillary: 195 mg/dL — ABNORMAL HIGH (ref 70–99)
Glucose-Capillary: 205 mg/dL — ABNORMAL HIGH (ref 70–99)
Glucose-Capillary: 207 mg/dL — ABNORMAL HIGH (ref 70–99)
Glucose-Capillary: 213 mg/dL — ABNORMAL HIGH (ref 70–99)
Glucose-Capillary: 238 mg/dL — ABNORMAL HIGH (ref 70–99)

## 2020-07-14 MED ORDER — INSULIN ASPART 100 UNIT/ML ~~LOC~~ SOLN
22.0000 [IU] | Freq: Three times a day (TID) | SUBCUTANEOUS | Status: DC
  Administered 2020-07-14 – 2020-07-15 (×2): 22 [IU] via SUBCUTANEOUS

## 2020-07-14 MED ORDER — INSULIN ASPART 100 UNIT/ML ~~LOC~~ SOLN
19.0000 [IU] | Freq: Three times a day (TID) | SUBCUTANEOUS | Status: DC
  Administered 2020-07-14: 19 [IU] via SUBCUTANEOUS

## 2020-07-14 MED ORDER — INSULIN NPH (HUMAN) (ISOPHANE) 100 UNIT/ML ~~LOC~~ SUSP: 23.0000 [IU] | Freq: Two times a day (BID) | SUBCUTANEOUS | Status: DC

## 2020-07-14 MED ORDER — INSULIN NPH (HUMAN) (ISOPHANE) 100 UNIT/ML ~~LOC~~ SUSP
25.0000 [IU] | Freq: Two times a day (BID) | SUBCUTANEOUS | Status: DC
  Administered 2020-07-14 – 2020-07-15 (×2): 25 [IU] via SUBCUTANEOUS

## 2020-07-14 NOTE — Progress Notes (Signed)
RE: okay to push and epidural Due: In 6 days Received: Today Early, Joann Meres, MD  Aletha Halim, MD No contraindications regarding her pregnancy. 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 Okay for pushing and also okay for epidural or spinal.       Previous Messages   ----- Message -----  From: Aletha Halim, MD  Sent: 07/12/2020 12:48 PM EDT  To: Rosetta Posner, MD  Subject: okay to push and epidural             Hi, Dr. Donnetta Hutching. You saw her last in June 2021. She's getting to the end of her pregnancy and I just wanted to double check and make sure that she's okay for pushing and okay for an epidural/spinal when the time comes. Thanks   Aletha Halim, Brooke Bonito MD  Attending  Center for [employer] (Faculty Practice)  07/12/2020

## 2020-07-14 NOTE — Progress Notes (Signed)
Daily Antepartum Note  Admission Date: 07/09/2020 Current Date: 07/14/2020 11:22 AM  Joann Meyers is a 34 y.o. G1P0000 @ [redacted]w[redacted]d, HD#6, admitted for BP and blood sugar control.  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   Hypertension during pregnancy, antepartum 03/24/2020   Obesity during pregnancy in second trimester 03/24/2020   Supervision of high risk pregnancy, antepartum 02/04/2020   Preexisting diabetes complicating pregnancy, antepartum 02/04/2020   Seizures ([place])    Left-sided weakness 09/21/2018   TIA (transient ischemic attack) 09/21/2018   Thyroid nodule 09/21/2018   Chest pain 07/22/2017   Non-compliance 07/22/2017   Obesity (BMI 30-39.9) 07/22/2017   Microcytic anemia 03/13/2017   History of stroke 03/03/2017   Type 2 diabetes mellitus without complication, without long-term current use of insulin ([place]) 03/03/2017    Overnight/24hr events:  none  Subjective:  No s/s of pre-eclampsia, decreased fetal movement, labor s/s, chest pain, sob  Objective:    Current Vital Signs 24h Vital Sign Ranges  T 98.1 F (36.7 C) Temp  Avg: 98.1 F (36.7 C)  Min: 98 F (36.7 C)  Max: 98.2 F (36.8 C)  BP (!) 147/65 BP  Min: 133/64  Max: 147/65  HR 79 Pulse  Avg: 83.1  Min: 79  Max: 87  RR 17 Resp  Avg: 17.7  Min: 17  Max: 19  SaO2 100 % Room Air SpO2  Avg: 98.6 %  Min: 97 %  Max: 100 %       24 Hour I/O Current Shift I/O  Time Ins Outs No intake/output data recorded. No intake/output data  recorded.   Patient Vitals for the past 24 hrs:  BP Temp Temp src Pulse Resp SpO2  07/14/20 0837 (!) 147/65 98.1 F (36.7 C) Oral 79 17 100 %  07/14/20 0459 140/75 98.2 F (36.8 C) Oral 87 17 98 %  07/14/20 0002 (!) 140/59 98.1 F (36.7 C) Oral 83 18 100 %  07/13/20 2010 136/65 -- -- 87 -- 98 %  07/13/20 2009 -- 98 F (36.7 C) Oral 85 17 98 %  07/13/20 1637 140/76 98.2 F (36.8 C) Oral 81 19 99 %  07/13/20 1438 133/64 98.1 F (36.7 C) Oral 80 18 97 %   FHT: 135 baseline, +accels, no decel, mod variability Toco: quiet x 20 minutes  Physical exam: General: Well nourished, well developed female in no acute distress. Abdomen: gravid nttp Cardiovascular: S1, S2 normal, no murmur, rub or gallop, regular rate and rhythm Respiratory: CTAB. Extremities: no clubbing, cyanosis or edema Skin: Warm and dry.   Medications: Current Facility-Administered Medications  Medication Dose Route Frequency Provider Last Rate Last Admin   0.9 %  sodium chloride infusion  250 mL Intravenous PRN Woodroe Mode, MD       acetaminophen (TYLENOL) tablet 650 mg  650 mg Oral Q4H PRN Woodroe Mode, MD   650 mg at 07/13/20 1009   aspirin EC tablet 162 mg  162 mg Oral Daily Woodroe Mode, MD   162 mg at 07/14/20 4098   calcium carbonate (TUMS - dosed in mg elemental  calcium) chewable tablet 400 mg of elemental calcium  2 tablet Oral Q4H PRN Woodroe Mode, MD       diphenhydrAMINE (BENADRYL) capsule 25 mg  25 mg Oral Q6H PRN Juanna Cao T, MD   25 mg at 07/11/20 0028   docusate sodium (COLACE) capsule 100 mg  100 mg Oral BID PRN Aletha Halim, MD       insulin aspart (novoLOG) injection 0-24 Units  0-24 Units Subcutaneous TID PC Woodroe Mode, MD   9 Units at 07/14/20 1046   insulin aspart (novoLOG) injection 16 Units  16 Units Subcutaneous TID Melvern Sample, MD   16 Units at 07/14/20 0844   insulin NPH Human (NOVOLIN N) injection 20 Units  20 Units Subcutaneous BID AC & HS  Aletha Halim, MD   20 Units at 07/14/20 0845   labetalol (NORMODYNE) tablet 400 mg  400 mg Oral BID Woodroe Mode, MD   400 mg at 07/14/20 0928   pantoprazole (PROTONIX) EC tablet 20 mg  20 mg Oral Daily Woodroe Mode, MD   20 mg at 07/14/20 8242   prenatal multivitamin tablet 1 tablet  1 tablet Oral Q1200 Woodroe Mode, MD   1 tablet at 07/13/20 1120   sodium chloride (OCEAN) 0.65 % nasal spray 1 spray  1 spray Each Nare PRN Aletha Halim, MD   1 spray at 07/12/20 1137   sodium chloride flush (NS) 0.9 % injection 3 mL  3 mL Intravenous Q12H Woodroe Mode, MD   3 mL at 07/13/20 2259   sodium chloride flush (NS) 0.9 % injection 3 mL  3 mL Intravenous PRN Woodroe Mode, MD       Vitamin D (Ergocalciferol) (DRISDOL) capsule 50,000 Units  50,000 Units Oral Q7 days Aletha Halim, MD   50,000 Units at 07/13/20 1446   zolpidem (AMBIEN) tablet 5 mg  5 mg Oral QHS PRN Woodroe Mode, MD        Labs:  Results for ALEAHA, FICKLING (MRN [id number]) as of 07/14/2020 11:23  Ref. Range 07/13/2020 20:07 07/14/2020 00:07 07/14/2020 06:43 07/14/2020 08:33 07/14/2020 10:39  Glucose-Capillary Latest Ref Range: 70 - 99 mg/dL 255 (H) 238 (H) 175 (H) 195 (H) 207 (H)   Radiology:  No new imaging 9/23: bpp 10/10, normal UA dopplers 9/9: EFW 3%, AC 34%, 1175gm, normal dopplers Assessment & Plan:  Pt stable *Pregnancy: reactive NST.  *cHTN with mild pre-eclampsia (+PCR): Doing well on increased labetalol *DM2: poor compliance at home. Continue with CBG titration. I increased her to nph 25/25 and aspart 22 tidac *Asymmetric FGR: continue with qshift NSTs. Has rpt UA dopplers and bpp tomorrow. D/w her and mfm re: if she is stable to go home since her BPs and sugars always increase when she is discharged *Preterm: hasn't had a course of BMZ during the pregnancy *Vascular: last seen by them on 04/06/20. Inbasket message sent to MD to make sure she's fine for trial of labor, pushing.  *PPx: SCDs,  OOB ad lib *FEN/GI: change to regular diet. SCDs *Dispo: based on dopplers, BPs and CBGs on Thursday.   Durene Romans MD Attending Center for [facility] GYN Consult Phone: [phone] (M-F, 0800-1700) & [phone] (Off hours, weekends, holidays)

## 2020-07-14 NOTE — Progress Notes (Addendum)
Inpatient Diabetes Program Recommendations  AACE/ADA: New Consensus Statement on Inpatient Glycemic Control (2015)  Target Ranges:  Prepandial:   less than 140 mg/dL      Peak postprandial:   less than 180 mg/dL (1-2 hours)      Critically ill patients:  140 - 180 mg/dL   Lab Results  Component Value Date   GLUCAP 195 (H) 07/14/2020   HGBA1C 7.5 (H) 07/12/2020    Review of Glycemic Control Results for Joann Meyers, Joann Meyers (MRN [id number]) as of 07/14/2020 08:58  Ref. Range 07/13/2020 20:07 07/14/2020 00:07 07/14/2020 06:43 07/14/2020 08:33  Glucose-Capillary Latest Ref Range: 70 - 99 mg/dL 255 (H) 238 (H) 175 (H) 195 (H)  Diabetes history: DM2 Outpatient Diabetes medications: NPH 12 units BID Novolog 5 units tid with meals Current orders for Inpatient glycemic control: NPH 20 units BID Novolog 16 units TID with meals Novolog 0-24 units tid after meals  Inpatient Diabetes Program Recommendations:  Consider increasing NPH to 25 units BID and Novolog 22 units TID.   Addendum: spoke at length with patient regarding current inpatient glucose trends.  Patient denies eating late night and only admits to having family bring in food once in the last 48 hours.  Patient reports, "I have actually been trying to be mindful of CHO intake."  Patient admits to drinking sugary beverages and feels that it is difficult to be cognizant given her cultural food. Reviewed culturally appropriate foods that are higher in CHO and portions that are more appropriate for patient. Reviewed plate method, how to count carbs and alternatives for sugary beverages.  Discussed patho of DM, especially in pregnancy, impact of hormonal fluctuations, insulin resistance, hyperinsulinemia in the neonate, risk for NICU admission, vascular changes, differences between long acting insulin vs short acting insulin, scheduled timing of injections and other commorbidities. Patient has a meter, but reports only checking blood  sugars once or twice per day because she would only eat one meal. Discussed need for balance with meals throughout the day and benefits toward metabolism and insulin injections. Reviewed recommended frequency of checking blood sugars in pregnancy and stressed the importance of target goals.  Lastly, discussed [employer], cost, benefits, application and how to interpreter results. Patient is interested. Applied sensor at 1300.   Discussed with Dr Ilda Basset recommendations and order provided for [facility] application.   Thanks, Bronson Curb, MSN, RNC-OB Diabetes Coordinator [phone] (8a-5p)

## 2020-07-15 ENCOUNTER — Inpatient Hospital Stay (HOSPITAL_BASED_OUTPATIENT_CLINIC_OR_DEPARTMENT_OTHER): Payer: Medicaid Other

## 2020-07-15 ENCOUNTER — Ambulatory Visit: Payer: Medicaid Other

## 2020-07-15 ENCOUNTER — Encounter: Payer: Self-pay | Admitting: Obstetrics and Gynecology

## 2020-07-15 DIAGNOSIS — O10013 Pre-existing essential hypertension complicating pregnancy, third trimester: Secondary | ICD-10-CM

## 2020-07-15 DIAGNOSIS — O24313 Unspecified pre-existing diabetes mellitus in pregnancy, third trimester: Secondary | ICD-10-CM

## 2020-07-15 DIAGNOSIS — D696 Thrombocytopenia, unspecified: Secondary | ICD-10-CM | POA: Insufficient documentation

## 2020-07-15 DIAGNOSIS — Z362 Encounter for other antenatal screening follow-up: Secondary | ICD-10-CM

## 2020-07-15 DIAGNOSIS — O365930 Maternal care for other known or suspected poor fetal growth, third trimester, not applicable or unspecified: Secondary | ICD-10-CM | POA: Diagnosis not present

## 2020-07-15 DIAGNOSIS — E71311 Medium chain acyl CoA dehydrogenase deficiency: Secondary | ICD-10-CM | POA: Diagnosis present

## 2020-07-15 DIAGNOSIS — O99112 Other diseases of the blood and blood-forming organs and certain disorders involving the immune mechanism complicating pregnancy, second trimester: Secondary | ICD-10-CM | POA: Insufficient documentation

## 2020-07-15 DIAGNOSIS — O2693 Pregnancy related conditions, unspecified, third trimester: Secondary | ICD-10-CM

## 2020-07-15 DIAGNOSIS — E119 Type 2 diabetes mellitus without complications: Secondary | ICD-10-CM | POA: Diagnosis not present

## 2020-07-15 DIAGNOSIS — Z3A33 33 weeks gestation of pregnancy: Secondary | ICD-10-CM

## 2020-07-15 LAB — COMPREHENSIVE METABOLIC PANEL
ALT: 17 U/L (ref 0–44)
AST: 17 U/L (ref 15–41)
Albumin: 2.6 g/dL — ABNORMAL LOW (ref 3.5–5.0)
Alkaline Phosphatase: 56 U/L (ref 38–126)
Anion gap: 9 (ref 5–15)
BUN: 10 mg/dL (ref 6–20)
CO2: 20 mmol/L — ABNORMAL LOW (ref 22–32)
Calcium: 8.9 mg/dL (ref 8.9–10.3)
Chloride: 107 mmol/L (ref 98–111)
Creatinine, Ser: 0.57 mg/dL (ref 0.44–1.00)
GFR calc Af Amer: >60 mL/min (ref >60)
GFR calc non Af Amer: >60 mL/min (ref >60)
Glucose, Bld: 160 mg/dL — ABNORMAL HIGH (ref 70–99)
Potassium: 3.6 mmol/L (ref 3.5–5.1)
Sodium: 136 mmol/L (ref 135–145)
Total Bilirubin: 0.3 mg/dL (ref 0.3–1.2)
Total Protein: 5.5 g/dL — ABNORMAL LOW (ref 6.5–8.1)

## 2020-07-15 LAB — CBC
HCT: 29.3 % — ABNORMAL LOW (ref 36.0–46.0)
Hemoglobin: 9.7 g/dL — ABNORMAL LOW (ref 12.0–15.0)
MCH: 27.9 pg (ref 26.0–34.0)
MCHC: 33.1 g/dL (ref 30.0–36.0)
MCV: 84.2 fL (ref 80.0–100.0)
Platelets: 136 10*3/uL — ABNORMAL LOW (ref 150–400)
RBC: 3.48 MIL/uL — ABNORMAL LOW (ref 3.87–5.11)
RDW: 14.8 % (ref 11.5–15.5)
WBC: 7.9 10*3/uL (ref 4.0–10.5)
nRBC: 0 % (ref 0.0–0.2)

## 2020-07-15 LAB — GLUCOSE, CAPILLARY
Glucose-Capillary: 146 mg/dL — ABNORMAL HIGH (ref 70–99)
Glucose-Capillary: 226 mg/dL — ABNORMAL HIGH (ref 70–99)
Glucose-Capillary: 77 mg/dL (ref 70–99)
Glucose-Capillary: 94 mg/dL (ref 70–99)
Glucose-Capillary: 215 mg/dL — ABNORMAL HIGH (ref 70–99)
Glucose-Capillary: 115 mg/dL — ABNORMAL HIGH (ref 70–99)
Glucose-Capillary: 141 mg/dL — ABNORMAL HIGH (ref 70–99)

## 2020-07-15 MED ORDER — INSULIN ASPART 100 UNIT/ML ~~LOC~~ SOLN
25.0000 [IU] | Freq: Three times a day (TID) | SUBCUTANEOUS | Status: DC
  Administered 2020-07-15 – 2020-07-16 (×5): 25 [IU] via SUBCUTANEOUS

## 2020-07-15 MED ORDER — SODIUM CHLORIDE 0.9 % IV SOLN
510.0000 mg | Freq: Once | INTRAVENOUS | Status: AC
  Administered 2020-07-15: 510 mg via INTRAVENOUS
  Filled 2020-07-15: qty 17

## 2020-07-15 MED ORDER — ONDANSETRON 4 MG PO TBDP
4.0000 mg | ORAL_TABLET | Freq: Three times a day (TID) | ORAL | Status: DC | PRN
  Administered 2020-07-15: 4 mg via ORAL
  Filled 2020-07-15: qty 1

## 2020-07-15 MED ORDER — INSULIN NPH (HUMAN) (ISOPHANE) 100 UNIT/ML ~~LOC~~ SUSP
30.0000 [IU] | Freq: Two times a day (BID) | SUBCUTANEOUS | Status: DC
  Administered 2020-07-15: 30 [IU] via SUBCUTANEOUS

## 2020-07-15 MED ORDER — INSULIN NPH (HUMAN) (ISOPHANE) 100 UNIT/ML ~~LOC~~ SUSP
15.0000 [IU] | Freq: Once | SUBCUTANEOUS | Status: AC
  Administered 2020-07-15: 15 [IU] via SUBCUTANEOUS

## 2020-07-15 NOTE — Progress Notes (Addendum)
Daily Antepartum Note  Admission Date: 07/09/2020 Current Date: 07/15/2020 10:11 AM  Joann Meyers is a 34 y.o. G1P0000 @ [redacted]w[redacted]d, HD#7, admitted for BP and blood sugar control.  Pregnancy complicated by: Patient Active Problem List   Diagnosis Date Noted  . Low vitamin D level 07/13/2020  . Mild pre-eclampsia 07/12/2020  . Type 2 diabetes mellitus during pregnancy 07/09/2020  . Rubella non-immune status, antepartum 05/20/2020  . Pregnancy affected by fetal growth restriction 05/20/2020  . Gestational diabetes mellitus (GDM) affecting pregnancy, antepartum 05/06/2020  . Headache in pregnancy, antepartum, second trimester 03/24/2020  . 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 Hypertension during pregnancy, antepartum 03/24/2020  . Obesity during pregnancy in second trimester 03/24/2020  . Supervision of high risk pregnancy, antepartum 02/04/2020  . Preexisting diabetes complicating pregnancy, antepartum 02/04/2020  . Seizures ([place])   . Left-sided weakness 09/21/2018  . TIA (transient ischemic attack) 09/21/2018  . Thyroid nodule 09/21/2018  . Chest pain 07/22/2017  . Non-compliance 07/22/2017  . Obesity (BMI 30-39.9) 07/22/2017  . Microcytic anemia 03/13/2017  . History of stroke 03/03/2017  . Type 2 diabetes mellitus without complication, without long-term current use of insulin ([place]) 03/03/2017    Overnight/24hr events:  none  Subjective:  No s/s of pre-eclampsia, decreased fetal movement, labor s/s, s/s of pre-eclampsia Objective:    Current Vital Signs 24h Vital Sign Ranges  T 98.2 F (36.8 C) Temp  Avg: 98.3 F (36.8 C)  Min: 98.2 F (36.8 C)  Max: 98.4 F (36.9 C)  BP (!) 146/73 (RN NOTIFIED) BP  Min: 128/69  Max: 149/71  HR 79 Pulse  Avg: 80  Min: 76  Max: 83  RR 18 Resp  Avg: 18  Min: 18  Max: 18  SaO2 100 % Room Air SpO2  Avg: 99.3 %  Min: 99 %  Max: 100 %       24 Hour I/O Current Shift I/O  Time Ins Outs 09/29 0701 - 09/30 0700 In: 1680  [P.O.:1680] Out: 1100 [Urine:1100] No intake/output data recorded.   Patient Vitals for the past 24 hrs:  BP Temp Temp src Pulse Resp SpO2  07/15/20 0732 (!) 146/73 98.2 F (36.8 C) Oral 79 18 100 %  07/14/20 2340 -- -- -- -- -- 99 %  07/14/20 2339 134/69 98.2 F (36.8 C) Oral 81 18 99 %  07/14/20 1929 (!) 133/59 98.3 F (36.8 C) Oral 76 18 100 %  07/14/20 1520 (!) 149/71 98.4 F (36.9 C) Oral 81 18 99 %  07/14/20 1206 128/69 98.4 F (36.9 C) Oral 83 18 99 %   FHT: 135 baseline, +accels, no decel, mod variability Toco: quiet x 20 minutes  Physical exam: General: Well nourished, well developed female in no acute distress. Abdomen: gravid nttp Cardiovascular: S1, S2 normal, no murmur, rub or gallop, regular rate and rhythm Respiratory: CTAB. Extremities: no clubbing, cyanosis or edema Skin: Warm and dry.   Medications: Current Facility-Administered Medications  Medication Dose Route Frequency Provider Last Rate Last Admin  . 0.9 %  sodium chloride infusion  250 mL Intravenous PRN Woodroe Mode, MD      . acetaminophen (TYLENOL) tablet 650 mg  650 mg Oral Q4H PRN Woodroe Mode, MD   650 mg at 07/13/20 1009  . aspirin EC tablet 162 mg  162 mg Oral Daily Woodroe Mode, MD   162 mg at 07/14/20 0272  . calcium carbonate (TUMS - dosed in mg elemental calcium) chewable tablet 400  mg of elemental calcium  2 tablet Oral Q4H PRN Woodroe Mode, MD      . diphenhydrAMINE (BENADRYL) capsule 25 mg  25 mg Oral Q6H PRN Cherre Blanc, MD   25 mg at 07/11/20 0028  . docusate sodium (COLACE) capsule 100 mg  100 mg Oral BID PRN Aletha Halim, MD      . insulin aspart (novoLOG) injection 0-24 Units  0-24 Units Subcutaneous TID PC Woodroe Mode, MD   13 Units at 07/14/20 2145  . insulin aspart (novoLOG) injection 22 Units  22 Units Subcutaneous TID Melvern Sample, MD   22 Units at 07/15/20 0755  . insulin NPH Human (NOVOLIN N) injection 25 Units  25 Units Subcutaneous BID AC &  HS Aletha Halim, MD   25 Units at 07/15/20 0756  . labetalol (NORMODYNE) tablet 400 mg  400 mg Oral BID Woodroe Mode, MD   400 mg at 07/14/20 2146  . pantoprazole (PROTONIX) EC tablet 20 mg  20 mg Oral Daily Woodroe Mode, MD   20 mg at 07/14/20 6433  . prenatal multivitamin tablet 1 tablet  1 tablet Oral Q1200 Woodroe Mode, MD   1 tablet at 07/14/20 1319  . sodium chloride (OCEAN) 0.65 % nasal spray 1 spray  1 spray Each Nare PRN Aletha Halim, MD   1 spray at 07/12/20 1137  . sodium chloride flush (NS) 0.9 % injection 3 mL  3 mL Intravenous Q12H Woodroe Mode, MD   3 mL at 07/14/20 2146  . sodium chloride flush (NS) 0.9 % injection 3 mL  3 mL Intravenous PRN Woodroe Mode, MD      . Vitamin D (Ergocalciferol) (DRISDOL) capsule 50,000 Units  50,000 Units Oral Q7 days Aletha Halim, MD   50,000 Units at 07/13/20 1446  . zolpidem (AMBIEN) tablet 5 mg  5 mg Oral QHS PRN Woodroe Mode, MD        Labs:  Results for [name], [name] (MRN [id number]) as of 07/15/2020 15:42  Ref. Range 07/14/2020 19:26 07/14/2020 21:30 07/15/2020 06:33 07/15/2020 09:51 07/15/2020 10:27 07/15/2020 10:56 07/15/2020 13:14 07/15/2020 14:02  Glucose-Capillary Latest Ref Range: 70 - 99 mg/dL 205 (H) 213 (H) 146 (H) 226 (H)   77 94   CBC Latest Ref Rng & Units 07/15/2020 07/12/2020 07/09/2020  WBC 4.0 - 10.5 K/uL 7.9 8.4 8.0  Hemoglobin 12.0 - 15.0 g/dL 9.7(L) 11.3(L) 10.2(L)  Hematocrit 36 - 46 % 29.3(L) 33.9(L) 30.6(L)  Platelets 150 - 400 K/uL 136(L) 164 133(L)    CMP Latest Ref Rng & Units 07/15/2020 07/12/2020 07/09/2020  Glucose 70 - 99 mg/dL 160(H) 190(H) 310(H)  BUN 6 - 20 mg/dL 10 10 11   Creatinine 0.44 - 1.00 mg/dL 0.57 0.54 0.51  Sodium 135 - 145 mmol/L 136 137 136  Potassium 3.5 - 5.1 mmol/L 3.6 3.9 3.9  Chloride 98 - 111 mmol/L 107 107 105  CO2 22 - 32 mmol/L 20(L) 20(L) 22  Calcium 8.9 - 10.3 mg/dL 8.9 9.2 9.0  Total Protein 6.5 - 8.1 g/dL 5.5(L) 6.0(L) 5.4(L)  Total Bilirubin 0.3 - 1.2 mg/dL  0.3 0.4 0.5  Alkaline Phos 38 - 126 U/L 56 61 66  AST 15 - 41 U/L 17 18 16   ALT 0 - 44 U/L 17 18 17      Radiology:  9/30: efw 1.2%, 1543g bpp 10/10, afi 21, elevated UA dopplers 9/23: bpp 10/10, normal UA dopplers 9/9: EFW 3%, AC 34%, 1175gm, normal  dopplers Assessment & Plan:  Pt stable *Pregnancy: reactive NST.  *cHTN with mild pre-eclampsia (+PCR): Doing well on increased labetalol *DM2: poor compliance at home. Continue with CBG titration. D/w DM team and will try and do an NPH 15u dose at 1500 as her morning dose may be wearing off by that time. I d/w her that I'd like to keep her until her CBGs are under better control and she is amenable to this.  *FGR: continue with qshift NSTs. Continue with weekly testing. Dr. Gertie Exon says to hold off on steroid course for now. She hasn't had a course yet this pregnancy *Preterm: see above *Vascular: last seen by them on 04/06/20. Per Dr. Donnetta Hutching, okay for trial of labor, reginal anesthesia *Anemia: pt amenable to feraheme.  *PPx: SCDs, OOB ad lib *FEN/GI: change to regular diet. SCDs *Dispo: likely Saturday morning   Durene Romans MD Attending Center for [facility]) GYN Consult Phone: [phone] (M-F, [phone]) & [phone] (Off hours, weekends, holidays)

## 2020-07-15 NOTE — Progress Notes (Signed)
Inpatient Diabetes Program Recommendations  AACE/ADA: New Consensus Statement on Inpatient Glycemic Control (2015)  Target Ranges:  Prepandial:   less than 140 mg/dL      Peak postprandial:   less than 180 mg/dL (1-2 hours)      Critically ill patients:  140 - 180 mg/dL   Lab Results  Component Value Date   GLUCAP 213 (H) 07/14/2020   HGBA1C 7.5 (H) 07/12/2020    Review of Glycemic Control Results for Joann Meyers, Joann Meyers (MRN [id number]) as of 07/15/2020 09:51  Ref. Range 07/14/2020 13:09 07/14/2020 15:16 07/14/2020 19:26 07/14/2020 21:30  Glucose-Capillary Latest Ref Range: 70 - 99 mg/dL 139 (H) 158 (H) 205 (H) 213 (H)   Diabetes history: DM2 Outpatient Diabetes medications: NPH 12 units BID Novolog 5 units tid with meals Current orders for Inpatient glycemic control: NPH 25units BID Novolog 22units TID with meals Novolog 0-24 units tid after meals  Inpatient Diabetes Program Recommendations:  Consider increasing NPH to 30 units BID and Novolog 25 units TID.  Add carb mod Add NPH 15 units @ 1500 x 1.  Spoke with patient regarding meal and snack intake in the last 24 hours and Freestyle Libre readings.  Patient admits to taking in gummy bears this AM. Reviewed how to read nutritional labels, amount of CHO in serving and showed glucose trends in CGM device. Patient unaware of impact of CHO to blood sugars. Stressed the importance of being mindful with intake, CHO alottments per meal/snack and why this in important for overall pregnancy health.  Based off readings does appear that NPH action is not lasting 12 hours aeb increasing trends without meal intake around 1600.  Concern for adding additional injection to patient's list with expectation for administration at home with discharge. Discussed recommendations with Dr Ilda Basset and to reassess in AM for switching to Levemir. With dietary indiscretion and poor expectation for patient to remain compliant with diet, will continue  with regular diet per MD.   Thanks, Bronson Curb, MSN, RNC-OB Diabetes Coordinator [phone] (8a-5p)

## 2020-07-15 NOTE — Progress Notes (Signed)
Feraheme IVF started at 1649. Pt started experiencing sharp lower back and bilateral leg pain. IVF stopped at 1700. Provider notified at 1706. 500 cc bolus of LR started per verbal order from MD. Provider at bedside at 1716.

## 2020-07-16 ENCOUNTER — Encounter: Payer: Medicaid Other | Admitting: Obstetrics & Gynecology

## 2020-07-16 ENCOUNTER — Other Ambulatory Visit (HOSPITAL_COMMUNITY): Payer: Self-pay | Admitting: Obstetrics and Gynecology

## 2020-07-16 DIAGNOSIS — O1403 Mild to moderate pre-eclampsia, third trimester: Secondary | ICD-10-CM

## 2020-07-16 DIAGNOSIS — Z9114 Patient's other noncompliance with medication regimen: Secondary | ICD-10-CM

## 2020-07-16 DIAGNOSIS — Z3A33 33 weeks gestation of pregnancy: Secondary | ICD-10-CM

## 2020-07-16 DIAGNOSIS — O10913 Unspecified pre-existing hypertension complicating pregnancy, third trimester: Secondary | ICD-10-CM

## 2020-07-16 DIAGNOSIS — Z8673 Personal history of transient ischemic attack (TIA), and cerebral infarction without residual deficits: Secondary | ICD-10-CM

## 2020-07-16 DIAGNOSIS — O365930 Maternal care for other known or suspected poor fetal growth, third trimester, not applicable or unspecified: Secondary | ICD-10-CM

## 2020-07-16 LAB — GLUCOSE, CAPILLARY
Glucose-Capillary: 108 mg/dL — ABNORMAL HIGH (ref 70–99)
Glucose-Capillary: 166 mg/dL — ABNORMAL HIGH (ref 70–99)
Glucose-Capillary: 165 mg/dL — ABNORMAL HIGH (ref 70–99)

## 2020-07-16 MED ORDER — FERROUS SULFATE 325 (65 FE) MG PO TABS: 325.0000 mg | ORAL_TABLET | Freq: Every day | ORAL | 3 refills | Status: DC

## 2020-07-16 MED ORDER — VITAMIN D (ERGOCALCIFEROL) 1.25 MG (50000 UNIT) PO CAPS: 50000.0000 [IU] | ORAL_CAPSULE | ORAL | 0 refills | Status: DC

## 2020-07-16 MED ORDER — INSULIN ASPART 100 UNIT/ML ~~LOC~~ SOLN: 25.0000 [IU] | Freq: Three times a day (TID) | SUBCUTANEOUS | 11 refills | Status: DC

## 2020-07-16 MED ORDER — INSULIN DETEMIR 100 UNIT/ML ~~LOC~~ SOLN: 40.0000 [IU] | Freq: Every day | SUBCUTANEOUS | Status: DC

## 2020-07-16 MED ORDER — SALINE SPRAY 0.65 % NA SOLN: 1.0000 | NASAL | 0 refills | Status: DC | PRN

## 2020-07-16 MED ORDER — INSULIN ASPART 100 UNIT/ML FLEXPEN: 25.0000 [IU] | PEN_INJECTOR | Freq: Three times a day (TID) | SUBCUTANEOUS | 11 refills | Status: DC

## 2020-07-16 MED ORDER — ONDANSETRON 4 MG PO TBDP: 4.0000 mg | ORAL_TABLET | Freq: Three times a day (TID) | ORAL | 0 refills | Status: DC | PRN

## 2020-07-16 MED ORDER — LEVEMIR FLEXTOUCH 100 UNIT/ML ~~LOC~~ SOPN: 40.0000 [IU] | PEN_INJECTOR | Freq: Two times a day (BID) | SUBCUTANEOUS | 11 refills | Status: DC

## 2020-07-16 MED ORDER — FERROUS GLUCONATE 324 (38 FE) MG PO TABS: 324.0000 mg | ORAL_TABLET | Freq: Every day | ORAL | Status: DC

## 2020-07-16 MED ORDER — POLYETHYLENE GLYCOL 3350 17 G PO PACK: 17.0000 g | PACK | Freq: Every day | ORAL | 2 refills | Status: DC

## 2020-07-16 MED ORDER — POLYETHYLENE GLYCOL 3350 17 G PO PACK: 17.0000 g | PACK | Freq: Every day | ORAL | Status: DC

## 2020-07-16 MED ORDER — INSULIN DETEMIR 100 UNIT/ML ~~LOC~~ SOLN
40.0000 [IU] | Freq: Two times a day (BID) | SUBCUTANEOUS | Status: DC
  Administered 2020-07-16: 40 [IU] via SUBCUTANEOUS
  Filled 2020-07-16 (×2): qty 0.4

## 2020-07-16 NOTE — Progress Notes (Signed)
Pt discharged in stable condition.

## 2020-07-16 NOTE — Progress Notes (Signed)
Meds delivered to pt from [facility].

## 2020-07-16 NOTE — Progress Notes (Signed)
Pt given discharge instructions and work note. Pt verbalized understanding. All questions answered. IV discontinued.

## 2020-07-16 NOTE — Progress Notes (Signed)
Pt awaiting for transitions of care pharmacy to delivery home meds prior to discharge.

## 2020-07-16 NOTE — Discharge Summary (Signed)
Physician Discharge Summary  Patient ID: Joann Meyers MRN: [id number] DOB/AGE: 1986-03-26 34 y.o.  Admit date: 07/09/2020 Discharge date: 07/16/2020  Admission Diagnoses: Pregnancy at 32/1. Poorly controlled BPs and blood sugars. Poor patient compliance  Secondary Diagnoses: Chronic HTN, DM2, FGR, History of CVA and TIA  Discharge Diagnoses: Pregnancy at 7/1. Mild pre-eclampsia (new proteinuria) Improved blood sugars and BPs  Discharged Condition: good  Hospital Course: .  *Pregnancy: reassuring daily NSTs and weekly BPPs *Mild pre-eclampsia superimposed on cHTN: dx via new proteinuria *DM2: insulin titrated during hospitalization.  *FGR: per Dr. Gertie Exon of [facility]. No need for steroid course.  9/30: efw 1.2%, 1543g bpp 10/10, afi 21, elevated UA dopplers 9/23: bpp 10/10, normal UA dopplers 9/9: EFW 3%, AC 34%, 1175gm, normal dopplers *Anemia: pt had severe myalagias and states she couldn't feel her legs with feraheme so it was stopped. Pt continued on PO iron  *Low vitamin D: 17.8. pt placed on qwk vitamin d *Vascular: Per Dr. Donnetta Hutching, patient okay for trial of labor and okay for regional anesthesia  Consults: Diabetes Team. Pharmacy Meds to [facility].  Significant Diagnostic Studies:  9/30: EFW 1.2%, 1543gm, AC 9%, BPP 8/8, elevated UA dopplers  Treatments: insulin titration. Oral BP medication A1c: 7.5 on 9/27 CBC Latest Ref Rng & Units 07/15/2020 07/12/2020 07/09/2020  WBC 4.0 - 10.5 K/uL 7.9 8.4 8.0  Hemoglobin 12.0 - 15.0 g/dL 9.7(L) 11.3(L) 10.2(L)  Hematocrit 36 - 46 % 29.3(L) 33.9(L) 30.6(L)  Platelets 150 - 400 K/uL 136(L) 164 133(L)   CMP Latest Ref Rng & Units 07/15/2020 07/12/2020 07/09/2020  Glucose 70 - 99 mg/dL 160(H) 190(H) 310(H)  BUN 6 - 20 mg/dL _0 Creatinine 0.44 - 1.00 mg/dL 0.57 0.54 0.51  Sodium 135 - 145 mmol/L 136 137 136  Potassium 3.5 - 5.1 mmol/L 3.6 3.9 3.9  Chloride 98 - 111 mmol/L 107 107 105  CO2 22 - 32 mmol/L 20(L) 20(L) 22  Calcium 8.9 - 10.3  mg/dL 8.9 9.2 9.0  Total Protein 6.5 - 8.1 g/dL 5.5(L) 6.0(L) 5.4(L)  Total Bilirubin 0.3 - 1.2 mg/dL 0.3 0.4 0.5  Alkaline Phos 38 - 126 U/L 56 61 66  AST 15 - 41 U/L _1 ALT 0 - 44 U/L _2 Discharge Exam: Blood pressure 108/67, pulse 79, temperature 98.2 F (36.8 C), temperature source Oral, resp. rate 18, height _3  (1.575 m), weight 96.6 kg, SpO2 100 %. General: Well nourished, well developed female in no acute distress. Abdomen: gravid nttp Cardiovascular: S1, S2 normal, no murmur, rub or gallop, regular rate and rhythm Respiratory: CTAB. Extremities: no clubbing, cyanosis or edema Skin: Warm and dry.   Disposition: Discharge disposition: 01-Home or Self Care       Discharge Instructions    Discharge patient   Complete by: As directed    Discharge disposition: 01-Home or Self Care   Discharge patient date: 07/16/2020     Allergies as of 07/16/2020      Reactions   Morphine And Related Anaphylaxis   Shellfish Allergy Anaphylaxis   "Swell up and can't breath"   Blain Pais Allergy] Anaphylaxis   "couldn't breath"   Feraheme [ferumoxytol] Other (See Comments)   Hx of reaction- myalgia ble and lower back   Latex Dermatitis   Tomato Hives      Medication List    STOP taking these medications   Doxylamine-Pyridoxine 10-10 MG Tbec Commonly known as: Diclegis   insulin NPH  Human 100 UNIT/ML injection Commonly known as: NOVOLIN N     TAKE these medications   Accu-Chek Guide w/Device Kit USE AS DIRECTED   Accu-Chek Softclix Lancets lancets 100 each by Other route 4 (four) times daily. Use as instructed   acetaminophen 500 MG tablet Commonly known as: TYLENOL Take 1 tablet (500 mg total) by mouth every 6 (six) hours as needed.   aspirin EC 81 MG tablet Take 2 tablets (162 mg total) by mouth daily.   blood glucose meter kit and supplies Dispense based on patient and insurance preference. Use up to four times daily as directed. (FOR ICD-10  E10.9, E11.9).   ferrous sulfate 325 (65 FE) MG tablet Take 1 tablet (325 mg total) by mouth daily with breakfast. What changed: when to take this   glucose blood test strip Use as instructed   insulin aspart 100 UNIT/ML injection Commonly known as: NovoLOG Inject 25 Units into the skin 3 (three) times daily before meals. What changed: how much to take   insulin aspart 100 UNIT/ML FlexPen Commonly known as: NOVOLOG Inject 25 Units into the skin 3 (three) times daily before meals. What changed: You were already taking a medication with the same name, and this prescription was added. Make sure you understand how and when to take each.   labetalol 200 MG tablet Commonly known as: NORMODYNE Take 2 tablets (400 mg total) by mouth 2 (two) times daily.   Levemir FlexTouch 100 UNIT/ML FlexPen Generic drug: insulin detemir Inject 40 Units into the skin 2 (two) times daily. Twice a day: Breakfast and before bed   ondansetron 4 MG disintegrating tablet Commonly known as: ZOFRAN-ODT Take 1 tablet (4 mg total) by mouth every 8 (eight) hours as needed for nausea or vomiting.   pantoprazole 20 MG tablet Commonly known as: Protonix Take 1 tablet (20 mg total) by mouth daily.   polyethylene glycol 17 g packet Commonly known as: MIRALAX / GLYCOLAX Take 17 g by mouth daily.   Prenatabs Rx 29-1 MG Tabs Take 1 tablet by mouth daily.   sodium chloride 0.65 % Soln nasal spray Commonly known as: OCEAN Place 1 spray into both nostrils as needed for congestion.   Vitamin D (Ergocalciferol) 1.25 MG (50000 UNIT) Caps capsule Commonly known as: DRISDOL Take 1 capsule (50,000 Units total) by mouth every 7 (seven) days for 11 doses.       Follow-up [place] for [facility]. Go in 7 day(s).   Specialty: Obstetrics and Gynecology Contact information: [place] [phone]               Signed: Aletha Halim 07/16/2020, 2:56 PM

## 2020-07-16 NOTE — Progress Notes (Signed)
Inpatient Diabetes Program Recommendations  Diabetes Treatment Program Recommendations  ADA Standards of Care 2018 Diabetes in Pregnancy Target Glucose Ranges:  Fasting: 60 - 90 mg/dL Preprandial: 60 - 105 mg/dL 1 hr postprandial: Less than 140mg /dL (from first bite of meal) 2 hr postprandial: Less than 120 mg/dL (from first bite of meal)    Lab Results  Component Value Date   GLUCAP 108 (H) 07/16/2020   HGBA1C 7.5 (H) 07/12/2020    Review of Glycemic Control Results for Joann Meyers, Joann Meyers (MRN [id number]) as of 07/16/2020 09:23  Ref. Range 07/15/2020 19:12 07/15/2020 21:18 07/16/2020 07:40  Glucose-Capillary Latest Ref Range: 70 - 99 mg/dL 115 (H) 141 (H) 108 (H)   Diabetes history: DM2 Outpatient Diabetes medications: NPH 12 units BID Novolog 5 units tid with meals Current orders for Inpatient glycemic control: NPH25units BID Novolog 25units TID with meals NPH 15 units x 1 Novolog 0-24 units tid after meals  Inpatient Diabetes Program Recommendations: Glucose trends appear much improved.  Consider switching NPH doses to Levemir 40 units BID in preparation for discharge.  Discussed with Dr Ilda Basset, orders updated. Verified with RN to hold AM NPH dose.   Thanks, Bronson Curb, MSN, RNC-OB Diabetes Coordinator [phone] (8a-5p)

## 2020-07-16 NOTE — Discharge Instructions (Signed)
Take your blood sugars four times a day. Please call if: Your morning fasting is consistently below 70 or above 120  Your two hour after meal numbers are consistenly below 80 or above 140  Your blood pressure is consistently above 155 for the top number or above 105 for the bottom number  Hypertension During Pregnancy Hypertension is also called high blood pressure. High blood pressure means that the force of your blood moving in your body is too strong. It can cause problems for you and your baby. Different types of high blood pressure can happen during pregnancy. The types are:  High blood pressure before you got pregnant. This is called chronic hypertension.  This can continue during your pregnancy. Your doctor will want to keep checking your blood pressure. You may need medicine to keep your blood pressure under control while you are pregnant. You will need follow-up visits after you have your baby.  High blood pressure that goes up during pregnancy when it was normal before. This is called gestational hypertension. It will usually get better after you have your baby, but your doctor will need to watch your blood pressure to make sure that it is getting better.  Very high blood pressure during pregnancy. This is called preeclampsia. Very high blood pressure is an emergency that needs to be checked and treated right away.  You may develop very high blood pressure after giving birth. This is called postpartum preeclampsia. This usually occurs within 48 hours after childbirth but may occur up to 6 weeks after giving birth. This is rare. How does this affect me? If you have high blood pressure during pregnancy, you have a higher chance of developing high blood pressure:  As you get older.  If you get pregnant again. In some cases, high blood pressure during pregnancy can cause:  Stroke.  Heart attack.  Damage to the kidneys, lungs, or liver.  Preeclampsia.  Jerky movements you cannot  control (convulsions or seizures).  Problems with the placenta.   What can I do to lower my risk?   Keep a healthy weight.  Eat a healthy diet.  Follow what your doctor tells you about treating any medical problems that you had before becoming pregnant. It is very important to go to all of your doctor visits. Your doctor will check your blood pressure and make sure that your pregnancy is progressing as it should. Treatment should start early if a problem is found.   Follow these instructions at home:  Take your blood pressure 1-2 times per day. Call the office if your blood pressure is 155 or higher for the top number or 105 or higher for the bottom number.    Eating and drinking   Drink enough fluid to keep your pee (urine) pale yellow.  Avoid caffeine. Lifestyle  Do not use any products that contain nicotine or tobacco, such as cigarettes, e-cigarettes, and chewing tobacco. If you need help quitting, ask your doctor.  Do not use alcohol or drugs.  Avoid stress.  Rest and get plenty of sleep.  Regular exercise can help. Ask your doctor what kinds of exercise are best for you. General instructions  Take over-the-counter and prescription medicines only as told by your doctor.  Keep all prenatal and follow-up visits as told by your doctor. This is important. Contact a doctor if:  You have symptoms that your doctor told you to watch for, such as: ? Headaches. ? Nausea. ? Vomiting. ? Belly (abdominal) pain. ?  Dizziness. ? Light-headedness. Get help right away if:  You have: ? Very bad belly pain that does not get better with treatment. ? A very bad headache that does not get better. ? Vomiting that does not get better. ? Sudden, fast weight gain. ? Sudden swelling in your hands, ankles, or face. ? Blood in your pee. ? Blurry vision. ? Double vision. ? Shortness of breath. ? Chest pain. ? Weakness on one side of your body. ? Trouble  talking. Summary  High blood pressure is also called hypertension.  High blood pressure means that the force of your blood moving in your body is too strong.  High blood pressure can cause problems for you and your baby.  Keep all follow-up visits as told by your doctor. This is important. This information is not intended to replace advice given to you by your health care provider. Make sure you discuss any questions you have with your health care provider. Document Released: 11/04/2010 Document Revised: 01/23/2019 Document Reviewed: 10/29/2018 Elsevier Patient Education  2020 [employer].

## 2020-07-22 ENCOUNTER — Ambulatory Visit: Payer: Medicaid Other | Admitting: *Deleted

## 2020-07-22 ENCOUNTER — Ambulatory Visit: Payer: Medicaid Other | Attending: Obstetrics and Gynecology

## 2020-07-22 ENCOUNTER — Encounter: Payer: Medicaid Other | Attending: Obstetrics & Gynecology | Admitting: Registered"

## 2020-07-22 ENCOUNTER — Other Ambulatory Visit: Payer: Self-pay

## 2020-07-22 ENCOUNTER — Ambulatory Visit: Payer: Medicaid Other | Admitting: Registered"

## 2020-07-22 VITALS — BP 148/70 | HR 85

## 2020-07-22 DIAGNOSIS — O10913 Unspecified pre-existing hypertension complicating pregnancy, third trimester: Secondary | ICD-10-CM | POA: Diagnosis not present

## 2020-07-22 DIAGNOSIS — R569 Unspecified convulsions: Secondary | ICD-10-CM

## 2020-07-22 DIAGNOSIS — O0990 Supervision of high risk pregnancy, unspecified, unspecified trimester: Secondary | ICD-10-CM

## 2020-07-22 DIAGNOSIS — G40909 Epilepsy, unspecified, not intractable, without status epilepticus: Secondary | ICD-10-CM

## 2020-07-22 DIAGNOSIS — O99891 Other specified diseases and conditions complicating pregnancy: Secondary | ICD-10-CM | POA: Insufficient documentation

## 2020-07-22 DIAGNOSIS — E119 Type 2 diabetes mellitus without complications: Secondary | ICD-10-CM | POA: Insufficient documentation

## 2020-07-22 DIAGNOSIS — O09899 Supervision of other high risk pregnancies, unspecified trimester: Secondary | ICD-10-CM

## 2020-07-22 DIAGNOSIS — O365990 Maternal care for other known or suspected poor fetal growth, unspecified trimester, not applicable or unspecified: Secondary | ICD-10-CM | POA: Diagnosis not present

## 2020-07-22 DIAGNOSIS — O99353 Diseases of the nervous system complicating pregnancy, third trimester: Secondary | ICD-10-CM

## 2020-07-22 DIAGNOSIS — Z3A00 Weeks of gestation of pregnancy not specified: Secondary | ICD-10-CM | POA: Insufficient documentation

## 2020-07-22 DIAGNOSIS — O24313 Unspecified pre-existing diabetes mellitus in pregnancy, third trimester: Secondary | ICD-10-CM

## 2020-07-22 DIAGNOSIS — Z283 Underimmunization status: Secondary | ICD-10-CM | POA: Diagnosis present

## 2020-07-22 DIAGNOSIS — Z3A34 34 weeks gestation of pregnancy: Secondary | ICD-10-CM

## 2020-07-22 DIAGNOSIS — O24319 Unspecified pre-existing diabetes mellitus in pregnancy, unspecified trimester: Secondary | ICD-10-CM

## 2020-07-22 DIAGNOSIS — Z2839 Other underimmunization status: Secondary | ICD-10-CM

## 2020-07-22 DIAGNOSIS — O365930 Maternal care for other known or suspected poor fetal growth, third trimester, not applicable or unspecified: Secondary | ICD-10-CM | POA: Diagnosis not present

## 2020-07-22 DIAGNOSIS — O2693 Pregnancy related conditions, unspecified, third trimester: Secondary | ICD-10-CM

## 2020-07-22 NOTE — Procedures (Signed)
Joann Meyers [birth year] [redacted]w[redacted]d  Fetus A Non-Stress Test Interpretation for 07/22/20  Indication: IUGR  Fetal Heart Rate A Mode: External Baseline Rate (A): 135 bpm Variability: Moderate Accelerations: 15 x 15 Decelerations: None Multiple birth?: No  Uterine Activity Mode: Palpation, Toco Contraction Frequency (min): none noted Resting Tone Palpated: Relaxed Resting Time: Adequate  Interpretation (Fetal Testing) Nonstress Test Interpretation: Reactive Comments: Reviewed tracing with Dr.Fang

## 2020-07-22 NOTE — Patient Instructions (Signed)
For Low blood sugar:  When having symptoms check your blood sugar. If 70 or less, use 15 grams of fast-acting carbohydrates. Glucose tablets are a good thing to have on hand, eat 3 glucose tables and wait 15 min then if back up eat protein. To avoid low blood sugar: Eat at least 15 grams of carbohydrates with meals because you are taking mealtime insulin. To avoid high blood sugar:   remember to take your insulin  Consider only having part of the taco bell item that you like because the whole cruchy taco is equal to 5 pieces of bread.  When looking up menu items, type the restaurant name and "Nutrition Facts" in the search bar.

## 2020-07-22 NOTE — Progress Notes (Signed)
Patient was seen on 07/22/20 for pre-existing type 2 Diabetes in pregnancy self-management, last A1c 7.5%. EDD 09/02/20.   Currently using insulin started during hospital admission Novolog pen; 25 units meals; Levemir 40 units evening  Diet history obtained. Patient states she was eating 1 meal per day, but has started eating 3x/day due to advice she was given. Pt states sometimes she gets full easy and eats small amounts of food. Beverages include water. Patient gets take out often. Pt reports she only drinks water.  Patient states she is always very tired. Pt reports she works 5pm-2 am 7 days/week. Pt reports she has to take a shower after work and doesn't get to sleep until 4:30 am, wakes up at 9 am. Pt states she has given her 2-week notice at work. Pt states she has taken her second week of high-dose vitamin D. The following learning objectives were met by the patient :   Describes the effects of carbohydrates on blood glucose levels  Demonstrates ability to create a balanced meal plan  Demonstrates carbohydrate counting   States when to check blood glucose levels  States appropriate treatment of low blood sugar  Plan:  Aim for 1-3 Carbohydrate Choices per meal (45 or less grams)   Aim for 1-2 Carbohydrate Choices per snack Begin reading food labels for Total Carbohydrate of foods Bring Log Book/Sheet and meter to every medical appointment  Baby Scripts: Patient records on readings on glucose log sheet Patient not appropriate for Baby Scripts due to extended amount of time which it would take to train her how to use. Take medication if directed by MD  Patient already has a meter, is  testing pre breakfast and 2 hours after each meal. Review of Log Book shows:  FBS Br Lu Dinner 95 110 124 192 90 109 119 177 95 219* '124 180 100 145 151 110 110 129 161 139 ' 200* 146  Pt states she had one hypoglycemic event, 59 mg/dL, since hospital discharge with symptoms of whole body  shaking. Pt states she ate chocolate and it took about an hour to feel better.  Patient instructed to monitor glucose levels: FBS: 60 - 95 mg/dl 2 hour: <120 mg/dl  Patient received the following handouts:  Pre-existing Diabetes and Pregnancy  Rule of 15 treating low blood sugar  Patient will be seen for follow-up as needed.

## 2020-07-27 ENCOUNTER — Other Ambulatory Visit: Payer: Self-pay | Admitting: Family Medicine

## 2020-07-27 ENCOUNTER — Other Ambulatory Visit: Payer: Self-pay

## 2020-07-27 DIAGNOSIS — K219 Gastro-esophageal reflux disease without esophagitis: Secondary | ICD-10-CM

## 2020-07-29 ENCOUNTER — Other Ambulatory Visit: Payer: Self-pay | Admitting: *Deleted

## 2020-07-29 ENCOUNTER — Ambulatory Visit: Payer: Medicaid Other

## 2020-07-29 ENCOUNTER — Ambulatory Visit: Payer: Medicaid Other | Admitting: *Deleted

## 2020-07-29 ENCOUNTER — Encounter (HOSPITAL_COMMUNITY): Payer: Self-pay | Admitting: Obstetrics and Gynecology

## 2020-07-29 ENCOUNTER — Other Ambulatory Visit: Payer: Self-pay

## 2020-07-29 ENCOUNTER — Ambulatory Visit: Payer: Medicaid Other | Attending: Obstetrics and Gynecology

## 2020-07-29 ENCOUNTER — Other Ambulatory Visit: Payer: Self-pay | Admitting: Obstetrics and Gynecology

## 2020-07-29 ENCOUNTER — Inpatient Hospital Stay (HOSPITAL_COMMUNITY)
Admission: AD | Admit: 2020-07-29 | Discharge: 2020-07-29 | Disposition: A | Discharge status: Home / Self Care | Payer: Medicaid Other | Attending: Obstetrics and Gynecology | Admitting: Obstetrics and Gynecology

## 2020-07-29 DIAGNOSIS — O0990 Supervision of high risk pregnancy, unspecified, unspecified trimester: Secondary | ICD-10-CM | POA: Insufficient documentation

## 2020-07-29 DIAGNOSIS — O24319 Unspecified pre-existing diabetes mellitus in pregnancy, unspecified trimester: Secondary | ICD-10-CM | POA: Insufficient documentation

## 2020-07-29 DIAGNOSIS — Z283 Underimmunization status: Secondary | ICD-10-CM | POA: Diagnosis present

## 2020-07-29 DIAGNOSIS — O365990 Maternal care for other known or suspected poor fetal growth, unspecified trimester, not applicable or unspecified: Secondary | ICD-10-CM

## 2020-07-29 DIAGNOSIS — R569 Unspecified convulsions: Secondary | ICD-10-CM | POA: Diagnosis present

## 2020-07-29 DIAGNOSIS — Z3A35 35 weeks gestation of pregnancy: Secondary | ICD-10-CM

## 2020-07-29 DIAGNOSIS — Z7982 Long term (current) use of aspirin: Secondary | ICD-10-CM | POA: Diagnosis not present

## 2020-07-29 DIAGNOSIS — Z79899 Other long term (current) drug therapy: Secondary | ICD-10-CM | POA: Insufficient documentation

## 2020-07-29 DIAGNOSIS — O24313 Unspecified pre-existing diabetes mellitus in pregnancy, third trimester: Secondary | ICD-10-CM | POA: Diagnosis not present

## 2020-07-29 DIAGNOSIS — G40909 Epilepsy, unspecified, not intractable, without status epilepticus: Secondary | ICD-10-CM

## 2020-07-29 DIAGNOSIS — O24113 Pre-existing diabetes mellitus, type 2, in pregnancy, third trimester: Secondary | ICD-10-CM | POA: Insufficient documentation

## 2020-07-29 DIAGNOSIS — Z8673 Personal history of transient ischemic attack (TIA), and cerebral infarction without residual deficits: Secondary | ICD-10-CM | POA: Diagnosis not present

## 2020-07-29 DIAGNOSIS — Z2839 Other underimmunization status: Secondary | ICD-10-CM

## 2020-07-29 DIAGNOSIS — O99353 Diseases of the nervous system complicating pregnancy, third trimester: Secondary | ICD-10-CM

## 2020-07-29 DIAGNOSIS — O2693 Pregnancy related conditions, unspecified, third trimester: Secondary | ICD-10-CM

## 2020-07-29 DIAGNOSIS — O99891 Other specified diseases and conditions complicating pregnancy: Secondary | ICD-10-CM | POA: Diagnosis present

## 2020-07-29 DIAGNOSIS — O09899 Supervision of other high risk pregnancies, unspecified trimester: Secondary | ICD-10-CM

## 2020-07-29 DIAGNOSIS — O113 Pre-existing hypertension with pre-eclampsia, third trimester: Secondary | ICD-10-CM

## 2020-07-29 DIAGNOSIS — O119 Pre-existing hypertension with pre-eclampsia, unspecified trimester: Secondary | ICD-10-CM

## 2020-07-29 DIAGNOSIS — Z3689 Encounter for other specified antenatal screening: Secondary | ICD-10-CM

## 2020-07-29 DIAGNOSIS — Z794 Long term (current) use of insulin: Secondary | ICD-10-CM | POA: Insufficient documentation

## 2020-07-29 DIAGNOSIS — O10913 Unspecified pre-existing hypertension complicating pregnancy, third trimester: Secondary | ICD-10-CM

## 2020-07-29 DIAGNOSIS — O365930 Maternal care for other known or suspected poor fetal growth, third trimester, not applicable or unspecified: Secondary | ICD-10-CM | POA: Diagnosis not present

## 2020-07-29 DIAGNOSIS — O321XX0 Maternal care for breech presentation, not applicable or unspecified: Secondary | ICD-10-CM

## 2020-07-29 LAB — COMPREHENSIVE METABOLIC PANEL
ALT: 19 U/L (ref 0–44)
AST: 22 U/L (ref 15–41)
Albumin: 2.6 g/dL — ABNORMAL LOW (ref 3.5–5.0)
Alkaline Phosphatase: 63 U/L (ref 38–126)
Anion gap: 10 (ref 5–15)
BUN: 15 mg/dL (ref 6–20)
CO2: 19 mmol/L — ABNORMAL LOW (ref 22–32)
Calcium: 8.8 mg/dL — ABNORMAL LOW (ref 8.9–10.3)
Chloride: 109 mmol/L (ref 98–111)
Creatinine, Ser: 0.44 mg/dL (ref 0.44–1.00)
GFR, Estimated: >60 mL/min (ref >60)
Glucose, Bld: 88 mg/dL (ref 70–99)
Potassium: 3.7 mmol/L (ref 3.5–5.1)
Sodium: 138 mmol/L (ref 135–145)
Total Bilirubin: 0.2 mg/dL — ABNORMAL LOW (ref 0.3–1.2)
Total Protein: 5.6 g/dL — ABNORMAL LOW (ref 6.5–8.1)

## 2020-07-29 LAB — CBC
HCT: 29.5 % — ABNORMAL LOW (ref 36.0–46.0)
Hemoglobin: 9.8 g/dL — ABNORMAL LOW (ref 12.0–15.0)
MCH: 28.7 pg (ref 26.0–34.0)
MCHC: 33.2 g/dL (ref 30.0–36.0)
MCV: 86.3 fL (ref 80.0–100.0)
Platelets: 154 10*3/uL (ref 150–400)
RBC: 3.42 MIL/uL — ABNORMAL LOW (ref 3.87–5.11)
RDW: 15.1 % (ref 11.5–15.5)
WBC: 8.7 10*3/uL (ref 4.0–10.5)
nRBC: 0.2 % (ref 0.0–0.2)

## 2020-07-29 LAB — URINALYSIS, ROUTINE W REFLEX MICROSCOPIC
Bilirubin Urine: NEGATIVE
Glucose, UA: NEGATIVE mg/dL
Hgb urine dipstick: NEGATIVE
Ketones, ur: NEGATIVE mg/dL
Nitrite: NEGATIVE
Protein, ur: 30 mg/dL — AB
Specific Gravity, Urine: 1.017 (ref 1.005–1.030)
pH: 5 (ref 5.0–8.0)

## 2020-07-29 LAB — PROTEIN / CREATININE RATIO, URINE
Creatinine, Urine: 73.66 mg/dL
Protein Creatinine Ratio: 0.9 mg/mg{Cre} — ABNORMAL HIGH (ref 0.00–0.15)
Total Protein, Urine: 66 mg/dL

## 2020-07-29 LAB — GLUCOSE, CAPILLARY: Glucose-Capillary: 87 mg/dL (ref 70–99)

## 2020-07-29 MED ORDER — LABETALOL HCL 5 MG/ML IV SOLN: 40.0000 mg | INTRAVENOUS | Status: DC | PRN

## 2020-07-29 MED ORDER — LABETALOL HCL 5 MG/ML IV SOLN: 80.0000 mg | INTRAVENOUS | Status: DC | PRN

## 2020-07-29 MED ORDER — HYDRALAZINE HCL 20 MG/ML IJ SOLN: 10.0000 mg | INTRAMUSCULAR | Status: DC | PRN

## 2020-07-29 MED ORDER — LABETALOL HCL 200 MG PO TABS: 400.0000 mg | ORAL_TABLET | Freq: Three times a day (TID) | ORAL | 3 refills | Status: DC

## 2020-07-29 MED ORDER — LABETALOL HCL 100 MG PO TABS
400.0000 mg | ORAL_TABLET | Freq: Once | ORAL | Status: AC
  Administered 2020-07-29: 400 mg via ORAL
  Filled 2020-07-29: qty 4

## 2020-07-29 MED ORDER — BETAMETHASONE SOD PHOS & ACET 6 (3-3) MG/ML IJ SUSP
12.0000 mg | INTRAMUSCULAR | Status: DC
  Administered 2020-07-29: 12 mg via INTRAMUSCULAR
  Filled 2020-07-29: qty 5

## 2020-07-29 MED ORDER — LABETALOL HCL 5 MG/ML IV SOLN
20.0000 mg | INTRAVENOUS | Status: DC | PRN
  Administered 2020-07-29: 20 mg via INTRAVENOUS
  Filled 2020-07-29: qty 4

## 2020-07-29 NOTE — Discharge Instructions (Signed)
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
## 2020-07-29 NOTE — MAU Note (Signed)
Pt sent from office for NST and elevated B/P. Did not take her B/P meds this morning.  Denies h/a or dizziness or visual changes. Good fetal movement reported

## 2020-07-30 ENCOUNTER — Other Ambulatory Visit (HOSPITAL_COMMUNITY)
Admission: RE | Admit: 2020-07-30 | Discharge: 2020-07-30 | Disposition: A | Discharge status: Home / Self Care | Payer: Medicaid Other | Source: Ambulatory Visit | Attending: Family Medicine | Admitting: Family Medicine

## 2020-07-30 ENCOUNTER — Ambulatory Visit (INDEPENDENT_AMBULATORY_CARE_PROVIDER_SITE_OTHER): Payer: Medicaid Other | Admitting: Family Medicine

## 2020-07-30 VITALS — BP 157/81 | HR 98 | Wt 229.7 lb

## 2020-07-30 DIAGNOSIS — O1403 Mild to moderate pre-eclampsia, third trimester: Secondary | ICD-10-CM

## 2020-07-30 DIAGNOSIS — O24319 Unspecified pre-existing diabetes mellitus in pregnancy, unspecified trimester: Secondary | ICD-10-CM

## 2020-07-30 DIAGNOSIS — O10019 Pre-existing essential hypertension complicating pregnancy, unspecified trimester: Secondary | ICD-10-CM

## 2020-07-30 DIAGNOSIS — R569 Unspecified convulsions: Secondary | ICD-10-CM

## 2020-07-30 DIAGNOSIS — O365990 Maternal care for other known or suspected poor fetal growth, unspecified trimester, not applicable or unspecified: Secondary | ICD-10-CM

## 2020-07-30 DIAGNOSIS — O0990 Supervision of high risk pregnancy, unspecified, unspecified trimester: Secondary | ICD-10-CM | POA: Diagnosis present

## 2020-07-30 DIAGNOSIS — O09899 Supervision of other high risk pregnancies, unspecified trimester: Secondary | ICD-10-CM

## 2020-07-30 DIAGNOSIS — O321XX0 Maternal care for breech presentation, not applicable or unspecified: Secondary | ICD-10-CM

## 2020-07-30 DIAGNOSIS — Z283 Underimmunization status: Secondary | ICD-10-CM

## 2020-07-30 DIAGNOSIS — Z8673 Personal history of transient ischemic attack (TIA), and cerebral infarction without residual deficits: Secondary | ICD-10-CM

## 2020-07-30 DIAGNOSIS — O99891 Other specified diseases and conditions complicating pregnancy: Secondary | ICD-10-CM

## 2020-07-30 DIAGNOSIS — O99212 Obesity complicating pregnancy, second trimester: Secondary | ICD-10-CM

## 2020-07-30 LAB — CULTURE, OB URINE

## 2020-07-30 MED ORDER — LABETALOL HCL 200 MG PO TABS: 600.0000 mg | ORAL_TABLET | Freq: Three times a day (TID) | ORAL | 3 refills | Status: DC

## 2020-07-30 MED ORDER — BETAMETHASONE SOD PHOS & ACET 6 (3-3) MG/ML IJ SUSP
12.0000 mg | Freq: Once | INTRAMUSCULAR | Status: AC
  Administered 2020-07-30: 12 mg via INTRAMUSCULAR

## 2020-07-30 NOTE — Progress Notes (Signed)
° °  PRENATAL VISIT NOTE  Subjective:  Joann Meyers is a 34 y.o. G1P0000 at 32w1dbeing seen today for ongoing prenatal care.  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  Patient reports no complaints. Was seen in MAU  Contractions: Not present. Vag. Bleeding: None.  Movement: Present. Denies leaking of fluid.   The following portions of the patient's history were reviewed and updated as appropriate: allergies, current medications, past family history, past medical history, past social history, past surgical history and problem list.   Objective:   Vitals:   07/30/20 1130 07/30/20 1139  BP: (!) 158/86 (!) 157/81  Pulse: (!) 108 98  Weight: 229 lb 11.2 oz (104.2 kg)     Fetal Status: Fetal Heart Rate (bpm): 131   Movement: Present     General:  Alert, oriented and cooperative. Patient is in no acute distress.  Skin: Skin is warm and dry. No rash noted.   Cardiovascular: Normal heart rate noted  Respiratory: Normal respiratory effort, no problems with respiration noted  Abdomen: Soft, gravid, appropriate for gestational age.  Pain/Pressure: Present     Pelvic: Cervical exam deferred        Extremities: Normal range of motion.  Edema: None  Mental Status: Normal mood and  affect. Normal behavior. Normal judgment and thought content.   Assessment and Plan:  Pregnancy: G1P0000 at 315w1d. Supervision of high risk pregnancy, antepartum FHT and FH normal  2. History of TIA (transient ischemic attack) and stroke Paresthesias resolved. Had BMMediaesterday in MAU. Will give second dose today.  3. Obesity during pregnancy in second trimester  4. Pregnancy affected by fetal growth restriction   5. Rubella non-immune status, antepartum MMR post delivery  6. Seizures ([place]  7. Preexisting diabetes complicating pregnancy, antepartum Fairly well controlled. [place] week  8. Pre-existing essential hypertension during pregnancy, antepartum Increase labetalol to 60055mID.  9. Mild pre-eclampsia in third trimester SUperimposed on preeclampsia  10. Breech position Patient desires version. Will schedule for 37 weeks with induction or c/s to follow.  Preterm labor symptoms and general obstetric precautions including but not limited to vaginal bleeding, contractions, leaking of fluid and fetal movement were reviewed in detail with the patient. Please refer to After Visit Summary for other counseling recommendations.   No follow-ups on file.  Future Appointments  Date Time Provider DepZol[place]0/18/2021  1:00 PM [facility]/18/2021  1:15 PM [facility]ST [facility]/21/2021 10:45 AM [facility] NURSE [facility]0/21/2021 11:00 AM [facility] US1 [facility]FCUS [facility] [place]/21/2021 11:30 AM [facility] NST W[facility]-MFC WMCPowerO

## 2020-07-30 NOTE — Progress Notes (Signed)
IOL faxed to L&D and received successful transmission.  Pt informed that L&D will call with time.  Pt verbalized understanding.  Mel Almond, RN  07/30/20

## 2020-07-30 NOTE — Addendum Note (Signed)
Addended by: Michel Harrow on: 07/30/2020 01:04 PM   Modules accepted: Orders

## 2020-07-31 ENCOUNTER — Other Ambulatory Visit: Payer: Self-pay

## 2020-07-31 ENCOUNTER — Inpatient Hospital Stay (HOSPITAL_COMMUNITY)
Admission: AD | Admit: 2020-07-31 | Discharge: 2020-08-04 | DRG: 787 | Disposition: A | Discharge status: Home / Self Care | Payer: Medicaid Other | Attending: Obstetrics & Gynecology | Admitting: Obstetrics & Gynecology

## 2020-07-31 DIAGNOSIS — R29810 Facial weakness: Secondary | ICD-10-CM | POA: Diagnosis not present

## 2020-07-31 DIAGNOSIS — I6521 Occlusion and stenosis of right carotid artery: Secondary | ICD-10-CM | POA: Diagnosis present

## 2020-07-31 DIAGNOSIS — G43909 Migraine, unspecified, not intractable, without status migrainosus: Secondary | ICD-10-CM | POA: Diagnosis present

## 2020-07-31 DIAGNOSIS — O1002 Pre-existing essential hypertension complicating childbirth: Secondary | ICD-10-CM | POA: Diagnosis present

## 2020-07-31 DIAGNOSIS — Z2839 Other underimmunization status: Secondary | ICD-10-CM

## 2020-07-31 DIAGNOSIS — O9942 Diseases of the circulatory system complicating childbirth: Secondary | ICD-10-CM | POA: Diagnosis present

## 2020-07-31 DIAGNOSIS — O1414 Severe pre-eclampsia complicating childbirth: Secondary | ICD-10-CM | POA: Diagnosis present

## 2020-07-31 DIAGNOSIS — R29706 NIHSS score 6: Secondary | ICD-10-CM | POA: Diagnosis present

## 2020-07-31 DIAGNOSIS — I7771 Dissection of carotid artery: Secondary | ICD-10-CM | POA: Diagnosis present

## 2020-07-31 DIAGNOSIS — O99019 Anemia complicating pregnancy, unspecified trimester: Secondary | ICD-10-CM | POA: Diagnosis present

## 2020-07-31 DIAGNOSIS — O99354 Diseases of the nervous system complicating childbirth: Secondary | ICD-10-CM | POA: Diagnosis present

## 2020-07-31 DIAGNOSIS — O169 Unspecified maternal hypertension, unspecified trimester: Secondary | ICD-10-CM | POA: Diagnosis present

## 2020-07-31 DIAGNOSIS — O99892 Other specified diseases and conditions complicating childbirth: Secondary | ICD-10-CM | POA: Diagnosis not present

## 2020-07-31 DIAGNOSIS — Z3A35 35 weeks gestation of pregnancy: Secondary | ICD-10-CM

## 2020-07-31 DIAGNOSIS — Z794 Long term (current) use of insulin: Secondary | ICD-10-CM

## 2020-07-31 DIAGNOSIS — O321XX0 Maternal care for breech presentation, not applicable or unspecified: Secondary | ICD-10-CM | POA: Diagnosis present

## 2020-07-31 DIAGNOSIS — Z8673 Personal history of transient ischemic attack (TIA), and cerebral infarction without residual deficits: Secondary | ICD-10-CM

## 2020-07-31 DIAGNOSIS — Z98891 History of uterine scar from previous surgery: Secondary | ICD-10-CM

## 2020-07-31 DIAGNOSIS — O0990 Supervision of high risk pregnancy, unspecified, unspecified trimester: Secondary | ICD-10-CM

## 2020-07-31 DIAGNOSIS — D62 Acute posthemorrhagic anemia: Secondary | ICD-10-CM | POA: Diagnosis present

## 2020-07-31 DIAGNOSIS — O10919 Unspecified pre-existing hypertension complicating pregnancy, unspecified trimester: Secondary | ICD-10-CM | POA: Diagnosis present

## 2020-07-31 DIAGNOSIS — O114 Pre-existing hypertension with pre-eclampsia, complicating childbirth: Principal | ICD-10-CM | POA: Diagnosis present

## 2020-07-31 DIAGNOSIS — R569 Unspecified convulsions: Secondary | ICD-10-CM

## 2020-07-31 DIAGNOSIS — O24319 Unspecified pre-existing diabetes mellitus in pregnancy, unspecified trimester: Secondary | ICD-10-CM | POA: Diagnosis present

## 2020-07-31 DIAGNOSIS — O1410 Severe pre-eclampsia, unspecified trimester: Secondary | ICD-10-CM | POA: Diagnosis present

## 2020-07-31 DIAGNOSIS — O365930 Maternal care for other known or suspected poor fetal growth, third trimester, not applicable or unspecified: Secondary | ICD-10-CM | POA: Diagnosis present

## 2020-07-31 DIAGNOSIS — E119 Type 2 diabetes mellitus without complications: Secondary | ICD-10-CM | POA: Diagnosis present

## 2020-07-31 DIAGNOSIS — O09899 Supervision of other high risk pregnancies, unspecified trimester: Secondary | ICD-10-CM

## 2020-07-31 DIAGNOSIS — O2412 Pre-existing diabetes mellitus, type 2, in childbirth: Secondary | ICD-10-CM | POA: Diagnosis present

## 2020-07-31 DIAGNOSIS — Z20822 Contact with and (suspected) exposure to covid-19: Secondary | ICD-10-CM | POA: Diagnosis present

## 2020-07-31 DIAGNOSIS — O9081 Anemia of the puerperium: Secondary | ICD-10-CM | POA: Diagnosis not present

## 2020-07-31 DIAGNOSIS — O119 Pre-existing hypertension with pre-eclampsia, unspecified trimester: Secondary | ICD-10-CM | POA: Diagnosis present

## 2020-07-31 DIAGNOSIS — Z283 Underimmunization status: Principal | ICD-10-CM

## 2020-07-31 NOTE — MAU Note (Signed)
Pt reports to MAU complaining of 10/10 headache and blurry vision that began at 1800. Reports she took her blood sugar was 375 and her blood pressure 175/100. Pt is also reporting pressure on her sternum.

## 2020-07-31 NOTE — MAU Note (Signed)
Pt stated she did not take her night time doe of her b/p med or her long actin insilin left them at home when she went to work.

## 2020-08-01 ENCOUNTER — Other Ambulatory Visit: Payer: Self-pay

## 2020-08-01 ENCOUNTER — Inpatient Hospital Stay (HOSPITAL_COMMUNITY): Payer: Medicaid Other | Admitting: Anesthesiology

## 2020-08-01 ENCOUNTER — Encounter (HOSPITAL_COMMUNITY)
Admission: AD | Disposition: A | Discharge status: Home / Self Care | Payer: Self-pay | Source: Home / Self Care | Attending: Obstetrics & Gynecology

## 2020-08-01 ENCOUNTER — Encounter (HOSPITAL_COMMUNITY): Payer: Self-pay | Admitting: Obstetrics & Gynecology

## 2020-08-01 DIAGNOSIS — E119 Type 2 diabetes mellitus without complications: Secondary | ICD-10-CM | POA: Diagnosis present

## 2020-08-01 DIAGNOSIS — O1414 Severe pre-eclampsia complicating childbirth: Secondary | ICD-10-CM

## 2020-08-01 DIAGNOSIS — O365930 Maternal care for other known or suspected poor fetal growth, third trimester, not applicable or unspecified: Secondary | ICD-10-CM | POA: Diagnosis present

## 2020-08-01 DIAGNOSIS — O99892 Other specified diseases and conditions complicating childbirth: Secondary | ICD-10-CM | POA: Diagnosis not present

## 2020-08-01 DIAGNOSIS — O9942 Diseases of the circulatory system complicating childbirth: Secondary | ICD-10-CM | POA: Diagnosis present

## 2020-08-01 DIAGNOSIS — D62 Acute posthemorrhagic anemia: Secondary | ICD-10-CM | POA: Diagnosis not present

## 2020-08-01 DIAGNOSIS — O9081 Anemia of the puerperium: Secondary | ICD-10-CM | POA: Diagnosis not present

## 2020-08-01 DIAGNOSIS — O1410 Severe pre-eclampsia, unspecified trimester: Secondary | ICD-10-CM | POA: Diagnosis present

## 2020-08-01 DIAGNOSIS — I6521 Occlusion and stenosis of right carotid artery: Secondary | ICD-10-CM | POA: Diagnosis present

## 2020-08-01 DIAGNOSIS — O119 Pre-existing hypertension with pre-eclampsia, unspecified trimester: Secondary | ICD-10-CM | POA: Diagnosis present

## 2020-08-01 DIAGNOSIS — R569 Unspecified convulsions: Secondary | ICD-10-CM | POA: Diagnosis not present

## 2020-08-01 DIAGNOSIS — I7771 Dissection of carotid artery: Secondary | ICD-10-CM | POA: Diagnosis not present

## 2020-08-01 DIAGNOSIS — R29810 Facial weakness: Secondary | ICD-10-CM | POA: Diagnosis not present

## 2020-08-01 DIAGNOSIS — O114 Pre-existing hypertension with pre-eclampsia, complicating childbirth: Secondary | ICD-10-CM | POA: Diagnosis present

## 2020-08-01 DIAGNOSIS — Z8673 Personal history of transient ischemic attack (TIA), and cerebral infarction without residual deficits: Secondary | ICD-10-CM | POA: Diagnosis not present

## 2020-08-01 DIAGNOSIS — O1002 Pre-existing essential hypertension complicating childbirth: Secondary | ICD-10-CM | POA: Diagnosis present

## 2020-08-01 DIAGNOSIS — Z794 Long term (current) use of insulin: Secondary | ICD-10-CM | POA: Diagnosis not present

## 2020-08-01 DIAGNOSIS — R29706 NIHSS score 6: Secondary | ICD-10-CM | POA: Diagnosis present

## 2020-08-01 DIAGNOSIS — O321XX0 Maternal care for breech presentation, not applicable or unspecified: Secondary | ICD-10-CM | POA: Diagnosis present

## 2020-08-01 DIAGNOSIS — G43909 Migraine, unspecified, not intractable, without status migrainosus: Secondary | ICD-10-CM | POA: Diagnosis present

## 2020-08-01 DIAGNOSIS — Z3A35 35 weeks gestation of pregnancy: Secondary | ICD-10-CM

## 2020-08-01 DIAGNOSIS — Z20822 Contact with and (suspected) exposure to covid-19: Secondary | ICD-10-CM | POA: Diagnosis present

## 2020-08-01 DIAGNOSIS — O2412 Pre-existing diabetes mellitus, type 2, in childbirth: Secondary | ICD-10-CM | POA: Diagnosis present

## 2020-08-01 DIAGNOSIS — Z98891 History of uterine scar from previous surgery: Secondary | ICD-10-CM

## 2020-08-01 DIAGNOSIS — O99354 Diseases of the nervous system complicating childbirth: Secondary | ICD-10-CM | POA: Diagnosis present

## 2020-08-01 LAB — RESPIRATORY PANEL BY RT PCR (FLU A&B, COVID)
Influenza A by PCR: NEGATIVE
Influenza B by PCR: NEGATIVE
SARS Coronavirus 2 by RT PCR: NEGATIVE

## 2020-08-01 LAB — GLUCOSE, CAPILLARY
Glucose-Capillary: 106 mg/dL — ABNORMAL HIGH (ref 70–99)
Glucose-Capillary: 142 mg/dL — ABNORMAL HIGH (ref 70–99)
Glucose-Capillary: 223 mg/dL — ABNORMAL HIGH (ref 70–99)
Glucose-Capillary: 258 mg/dL — ABNORMAL HIGH (ref 70–99)
Glucose-Capillary: 77 mg/dL (ref 70–99)
Glucose-Capillary: 99 mg/dL (ref 70–99)

## 2020-08-01 LAB — TYPE AND SCREEN
ABO/RH(D): O POS
Antibody Screen: NEGATIVE

## 2020-08-01 LAB — COMPREHENSIVE METABOLIC PANEL
ALT: 23 U/L (ref 0–44)
AST: 26 U/L (ref 15–41)
Albumin: 2.9 g/dL — ABNORMAL LOW (ref 3.5–5.0)
Alkaline Phosphatase: 62 U/L (ref 38–126)
Anion gap: 9 (ref 5–15)
BUN: 17 mg/dL (ref 6–20)
CO2: 21 mmol/L — ABNORMAL LOW (ref 22–32)
Calcium: 8.9 mg/dL (ref 8.9–10.3)
Chloride: 106 mmol/L (ref 98–111)
Creatinine, Ser: 0.54 mg/dL (ref 0.44–1.00)
GFR, Estimated: >60 mL/min (ref >60)
Glucose, Bld: 261 mg/dL — ABNORMAL HIGH (ref 70–99)
Potassium: 4 mmol/L (ref 3.5–5.1)
Sodium: 136 mmol/L (ref 135–145)
Total Bilirubin: 0.3 mg/dL (ref 0.3–1.2)
Total Protein: 6 g/dL — ABNORMAL LOW (ref 6.5–8.1)

## 2020-08-01 LAB — CBC
HCT: 29.7 % — ABNORMAL LOW (ref 36.0–46.0)
Hemoglobin: 9.6 g/dL — ABNORMAL LOW (ref 12.0–15.0)
MCH: 28.1 pg (ref 26.0–34.0)
MCHC: 32.3 g/dL (ref 30.0–36.0)
MCV: 86.8 fL (ref 80.0–100.0)
Platelets: 171 10*3/uL (ref 150–400)
RBC: 3.42 MIL/uL — ABNORMAL LOW (ref 3.87–5.11)
RDW: 15.4 % (ref 11.5–15.5)
WBC: 9.4 10*3/uL (ref 4.0–10.5)
nRBC: 1.4 % — ABNORMAL HIGH (ref 0.0–0.2)

## 2020-08-01 LAB — PROTEIN / CREATININE RATIO, URINE
Creatinine, Urine: 70.71 mg/dL
Protein Creatinine Ratio: 1.67 mg/mg{Cre} — ABNORMAL HIGH (ref 0.00–0.15)
Total Protein, Urine: 118 mg/dL

## 2020-08-01 LAB — MAGNESIUM: Magnesium: 3.9 mg/dL — ABNORMAL HIGH (ref 1.7–2.4)

## 2020-08-01 SURGERY — Surgical Case
Anesthesia: Spinal | Wound class: Clean Contaminated

## 2020-08-01 MED ORDER — PANTOPRAZOLE SODIUM 20 MG PO TBEC
20.0000 mg | DELAYED_RELEASE_TABLET | Freq: Every day | ORAL | Status: DC
  Administered 2020-08-01 – 2020-08-04 (×4): 20 mg via ORAL
  Filled 2020-08-01 (×4): qty 1

## 2020-08-01 MED ORDER — NALOXONE HCL 4 MG/10ML IJ SOLN
1.0000 ug/kg/h | INTRAVENOUS | Status: DC | PRN
  Filled 2020-08-01: qty 5

## 2020-08-01 MED ORDER — PRENATAL MULTIVITAMIN CH
1.0000 | ORAL_TABLET | Freq: Every day | ORAL | Status: DC
  Administered 2020-08-02 – 2020-08-04 (×3): 1 via ORAL
  Filled 2020-08-01 (×3): qty 1

## 2020-08-01 MED ORDER — ACCU-CHEK SOFTCLIX LANCETS MISC: 100.0000 | Freq: Four times a day (QID) | Status: DC

## 2020-08-01 MED ORDER — SCOPOLAMINE 1 MG/3DAYS TD PT72
MEDICATED_PATCH | TRANSDERMAL | Status: AC
  Filled 2020-08-01: qty 1

## 2020-08-01 MED ORDER — ONDANSETRON HCL 4 MG/2ML IJ SOLN
4.0000 mg | Freq: Once | INTRAMUSCULAR | Status: AC | PRN
  Administered 2020-08-01: 4 mg via INTRAVENOUS

## 2020-08-01 MED ORDER — SODIUM CHLORIDE 0.9% FLUSH: 3.0000 mL | INTRAVENOUS | Status: DC | PRN

## 2020-08-01 MED ORDER — ONDANSETRON HCL 4 MG/2ML IJ SOLN
INTRAMUSCULAR | Status: DC | PRN
  Administered 2020-08-01: 4 mg via INTRAVENOUS

## 2020-08-01 MED ORDER — PHENYLEPHRINE HCL-NACL 20-0.9 MG/250ML-% IV SOLN
INTRAVENOUS | Status: DC | PRN
  Administered 2020-08-01: 60 ug/min via INTRAVENOUS

## 2020-08-01 MED ORDER — SIMETHICONE 80 MG PO CHEW
80.0000 mg | CHEWABLE_TABLET | ORAL | Status: DC
  Administered 2020-08-02 – 2020-08-04 (×3): 80 mg via ORAL
  Filled 2020-08-01 (×3): qty 1

## 2020-08-01 MED ORDER — FENTANYL CITRATE (PF) 100 MCG/2ML IJ SOLN
INTRAMUSCULAR | Status: AC
  Filled 2020-08-01: qty 2

## 2020-08-01 MED ORDER — SCOPOLAMINE 1 MG/3DAYS TD PT72: 1.0000 | MEDICATED_PATCH | Freq: Once | TRANSDERMAL | Status: DC

## 2020-08-01 MED ORDER — SIMETHICONE 80 MG PO CHEW: 80.0000 mg | CHEWABLE_TABLET | ORAL | Status: DC | PRN

## 2020-08-01 MED ORDER — INSULIN GLARGINE 100 UNIT/ML ~~LOC~~ SOLN: 20.0000 [IU] | Freq: Two times a day (BID) | SUBCUTANEOUS | Status: DC

## 2020-08-01 MED ORDER — INSULIN DETEMIR 100 UNIT/ML ~~LOC~~ SOLN
20.0000 [IU] | Freq: Two times a day (BID) | SUBCUTANEOUS | Status: DC
  Administered 2020-08-01 – 2020-08-04 (×7): 20 [IU] via SUBCUTANEOUS
  Filled 2020-08-01 (×9): qty 0.2

## 2020-08-01 MED ORDER — INSULIN ASPART 100 UNIT/ML ~~LOC~~ SOLN
20.0000 [IU] | Freq: Once | SUBCUTANEOUS | Status: AC
  Administered 2020-08-01: 20 [IU] via SUBCUTANEOUS

## 2020-08-01 MED ORDER — SIMETHICONE 80 MG PO CHEW
80.0000 mg | CHEWABLE_TABLET | Freq: Three times a day (TID) | ORAL | Status: DC
  Administered 2020-08-01 – 2020-08-04 (×9): 80 mg via ORAL
  Filled 2020-08-01 (×10): qty 1

## 2020-08-01 MED ORDER — ONDANSETRON HCL 4 MG/2ML IJ SOLN
4.0000 mg | Freq: Once | INTRAMUSCULAR | Status: AC
  Administered 2020-08-01: 4 mg via INTRAVENOUS
  Filled 2020-08-01: qty 2

## 2020-08-01 MED ORDER — KETOROLAC TROMETHAMINE 30 MG/ML IJ SOLN
INTRAMUSCULAR | Status: AC
  Filled 2020-08-01: qty 1

## 2020-08-01 MED ORDER — DIPHENHYDRAMINE HCL 25 MG PO CAPS
25.0000 mg | ORAL_CAPSULE | Freq: Four times a day (QID) | ORAL | Status: DC | PRN
  Administered 2020-08-02: 25 mg via ORAL
  Filled 2020-08-01: qty 1

## 2020-08-01 MED ORDER — LACTATED RINGERS IV SOLN: INTRAVENOUS | Status: DC

## 2020-08-01 MED ORDER — SODIUM CHLORIDE 0.9 % IV SOLN: INTRAVENOUS | Status: DC | PRN

## 2020-08-01 MED ORDER — CEFAZOLIN SODIUM-DEXTROSE 2-4 GM/100ML-% IV SOLN
2.0000 g | INTRAVENOUS | Status: AC
  Administered 2020-08-01: 2 g via INTRAVENOUS

## 2020-08-01 MED ORDER — MAGNESIUM SULFATE BOLUS VIA INFUSION
5.0000 g | Freq: Once | INTRAVENOUS | Status: AC
  Administered 2020-08-01: 5 g via INTRAVENOUS
  Filled 2020-08-01: qty 1000

## 2020-08-01 MED ORDER — HYDROMORPHONE HCL 1 MG/ML IJ SOLN: 0.2500 mg | INTRAMUSCULAR | Status: DC | PRN

## 2020-08-01 MED ORDER — OXYTOCIN-SODIUM CHLORIDE 30-0.9 UT/500ML-% IV SOLN: 2.5000 [IU]/h | INTRAVENOUS | Status: AC

## 2020-08-01 MED ORDER — MENTHOL 3 MG MT LOZG: 1.0000 | LOZENGE | OROMUCOSAL | Status: DC | PRN

## 2020-08-01 MED ORDER — DIPHENHYDRAMINE HCL 25 MG PO CAPS: 25.0000 mg | ORAL_CAPSULE | ORAL | Status: DC | PRN

## 2020-08-01 MED ORDER — OXYCODONE HCL 5 MG PO TABS
5.0000 mg | ORAL_TABLET | ORAL | Status: DC | PRN
  Administered 2020-08-02: 5 mg via ORAL
  Administered 2020-08-03: 10 mg via ORAL
  Administered 2020-08-03: 5 mg via ORAL
  Filled 2020-08-01: qty 1
  Filled 2020-08-01: qty 2
  Filled 2020-08-01: qty 1

## 2020-08-01 MED ORDER — NALBUPHINE HCL 10 MG/ML IJ SOLN: 5.0000 mg | INTRAMUSCULAR | Status: DC | PRN

## 2020-08-01 MED ORDER — DIPHENHYDRAMINE HCL 50 MG/ML IJ SOLN: 12.5000 mg | INTRAMUSCULAR | Status: DC | PRN

## 2020-08-01 MED ORDER — OXYCODONE HCL 5 MG/5ML PO SOLN: 5.0000 mg | Freq: Once | ORAL | Status: DC | PRN

## 2020-08-01 MED ORDER — ZOLPIDEM TARTRATE 5 MG PO TABS: 5.0000 mg | ORAL_TABLET | Freq: Every evening | ORAL | Status: DC | PRN

## 2020-08-01 MED ORDER — FAMOTIDINE IN NACL 20-0.9 MG/50ML-% IV SOLN
20.0000 mg | Freq: Once | INTRAVENOUS | Status: AC
  Administered 2020-08-01: 20 mg via INTRAVENOUS
  Filled 2020-08-01: qty 50

## 2020-08-01 MED ORDER — OXYTOCIN-SODIUM CHLORIDE 30-0.9 UT/500ML-% IV SOLN
INTRAVENOUS | Status: AC
  Filled 2020-08-01: qty 500

## 2020-08-01 MED ORDER — TETANUS-DIPHTH-ACELL PERTUSSIS 5-2.5-18.5 LF-MCG/0.5 IM SUSP: 0.5000 mL | Freq: Once | INTRAMUSCULAR | Status: DC

## 2020-08-01 MED ORDER — DIBUCAINE (PERIANAL) 1 % EX OINT: 1.0000 "application " | TOPICAL_OINTMENT | CUTANEOUS | Status: DC | PRN

## 2020-08-01 MED ORDER — CEFAZOLIN SODIUM-DEXTROSE 2-4 GM/100ML-% IV SOLN
INTRAVENOUS | Status: AC
  Filled 2020-08-01: qty 100

## 2020-08-01 MED ORDER — MORPHINE SULFATE (PF) 0.5 MG/ML IJ SOLN
INTRAMUSCULAR | Status: AC
  Filled 2020-08-01: qty 10

## 2020-08-01 MED ORDER — NIFEDIPINE ER OSMOTIC RELEASE 30 MG PO TB24
30.0000 mg | ORAL_TABLET | Freq: Every day | ORAL | Status: DC
  Administered 2020-08-01 – 2020-08-02 (×2): 30 mg via ORAL
  Filled 2020-08-01 (×2): qty 1

## 2020-08-01 MED ORDER — SODIUM CHLORIDE 0.9 % IV SOLN
INTRAVENOUS | Status: AC
  Filled 2020-08-01: qty 2000

## 2020-08-01 MED ORDER — ONDANSETRON HCL 4 MG/2ML IJ SOLN
INTRAMUSCULAR | Status: AC
  Filled 2020-08-01: qty 2

## 2020-08-01 MED ORDER — OXYCODONE HCL 5 MG PO TABS: 5.0000 mg | ORAL_TABLET | Freq: Once | ORAL | Status: DC | PRN

## 2020-08-01 MED ORDER — METHYLERGONOVINE MALEATE 0.2 MG/ML IJ SOLN: 0.2000 mg | INTRAMUSCULAR | Status: DC | PRN

## 2020-08-01 MED ORDER — SENNOSIDES-DOCUSATE SODIUM 8.6-50 MG PO TABS
2.0000 | ORAL_TABLET | ORAL | Status: DC
  Administered 2020-08-02 – 2020-08-04 (×3): 2 via ORAL
  Filled 2020-08-01 (×3): qty 2

## 2020-08-01 MED ORDER — KETOROLAC TROMETHAMINE 30 MG/ML IJ SOLN
30.0000 mg | Freq: Four times a day (QID) | INTRAMUSCULAR | Status: AC
  Administered 2020-08-01 – 2020-08-02 (×2): 30 mg via INTRAVENOUS
  Filled 2020-08-01 (×3): qty 1

## 2020-08-01 MED ORDER — NALOXONE HCL 0.4 MG/ML IJ SOLN: 0.4000 mg | INTRAMUSCULAR | Status: DC | PRN

## 2020-08-01 MED ORDER — NALBUPHINE HCL 10 MG/ML IJ SOLN: 5.0000 mg | Freq: Once | INTRAMUSCULAR | Status: AC | PRN

## 2020-08-01 MED ORDER — SCOPOLAMINE 1 MG/3DAYS TD PT72
MEDICATED_PATCH | TRANSDERMAL | Status: DC | PRN
  Administered 2020-08-01: 1 via TRANSDERMAL

## 2020-08-01 MED ORDER — WITCH HAZEL-GLYCERIN EX PADS: 1.0000 "application " | MEDICATED_PAD | CUTANEOUS | Status: DC | PRN

## 2020-08-01 MED ORDER — KETOROLAC TROMETHAMINE 30 MG/ML IJ SOLN
30.0000 mg | Freq: Once | INTRAMUSCULAR | Status: AC | PRN
  Administered 2020-08-01: 30 mg via INTRAVENOUS

## 2020-08-01 MED ORDER — GABAPENTIN 100 MG PO CAPS
100.0000 mg | ORAL_CAPSULE | Freq: Three times a day (TID) | ORAL | Status: DC
  Administered 2020-08-01 – 2020-08-04 (×9): 100 mg via ORAL
  Filled 2020-08-01 (×9): qty 1

## 2020-08-01 MED ORDER — BUTALBITAL-APAP-CAFFEINE 50-325-40 MG PO TABS
2.0000 | ORAL_TABLET | Freq: Once | ORAL | Status: AC
  Administered 2020-08-01: 2 via ORAL
  Filled 2020-08-01: qty 2

## 2020-08-01 MED ORDER — ONDANSETRON HCL 4 MG/2ML IJ SOLN: 4.0000 mg | Freq: Three times a day (TID) | INTRAMUSCULAR | Status: DC | PRN

## 2020-08-01 MED ORDER — SOD CITRATE-CITRIC ACID 500-334 MG/5ML PO SOLN: 30.0000 mL | ORAL | Status: DC

## 2020-08-01 MED ORDER — NALBUPHINE HCL 10 MG/ML IJ SOLN
INTRAMUSCULAR | Status: AC
  Filled 2020-08-01: qty 1

## 2020-08-01 MED ORDER — STERILE WATER FOR IRRIGATION IR SOLN
Status: DC | PRN
  Administered 2020-08-01: 1000 mL

## 2020-08-01 MED ORDER — INSULIN DETEMIR 100 UNIT/ML ~~LOC~~ SOLN
40.0000 [IU] | Freq: Once | SUBCUTANEOUS | Status: AC
  Administered 2020-08-01: 40 [IU] via SUBCUTANEOUS
  Filled 2020-08-01: qty 0.4

## 2020-08-01 MED ORDER — OXYTOCIN-SODIUM CHLORIDE 30-0.9 UT/500ML-% IV SOLN
INTRAVENOUS | Status: DC | PRN
  Administered 2020-08-01: 30 [IU] via INTRAVENOUS

## 2020-08-01 MED ORDER — LABETALOL HCL 100 MG PO TABS
600.0000 mg | ORAL_TABLET | Freq: Once | ORAL | Status: AC
  Administered 2020-08-01: 600 mg via ORAL
  Filled 2020-08-01: qty 6

## 2020-08-01 MED ORDER — ENOXAPARIN SODIUM 40 MG/0.4ML ~~LOC~~ SOLN
40.0000 mg | SUBCUTANEOUS | Status: DC
  Administered 2020-08-01: 40 mg via SUBCUTANEOUS
  Filled 2020-08-01: qty 0.4

## 2020-08-01 MED ORDER — LACTATED RINGERS IV SOLN
INTRAVENOUS | Status: DC | PRN
  Administered 2020-08-01: 1000 mL via INTRAVENOUS

## 2020-08-01 MED ORDER — SODIUM CHLORIDE 0.9 % IR SOLN
Status: DC | PRN
  Administered 2020-08-01: 1000 mL

## 2020-08-01 MED ORDER — COCONUT OIL OIL: 1.0000 "application " | TOPICAL_OIL | Status: DC | PRN

## 2020-08-01 MED ORDER — MAGNESIUM SULFATE 40 GM/1000ML IV SOLN
2.0000 g/h | INTRAVENOUS | Status: AC
  Administered 2020-08-02: 2 g/h via INTRAVENOUS
  Filled 2020-08-01: qty 1000

## 2020-08-01 MED ORDER — INSULIN ASPART 100 UNIT/ML ~~LOC~~ SOLN
12.0000 [IU] | Freq: Three times a day (TID) | SUBCUTANEOUS | Status: DC
  Administered 2020-08-01: 6 [IU] via SUBCUTANEOUS
  Administered 2020-08-02 – 2020-08-04 (×7): 12 [IU] via SUBCUTANEOUS

## 2020-08-01 MED ORDER — PHENYLEPHRINE HCL-NACL 20-0.9 MG/250ML-% IV SOLN
INTRAVENOUS | Status: AC
  Filled 2020-08-01: qty 250

## 2020-08-01 MED ORDER — MAGNESIUM SULFATE 40 GM/1000ML IV SOLN
INTRAVENOUS | Status: AC
  Filled 2020-08-01: qty 1000

## 2020-08-01 MED ORDER — IBUPROFEN 800 MG PO TABS
800.0000 mg | ORAL_TABLET | Freq: Four times a day (QID) | ORAL | Status: DC
  Administered 2020-08-02 – 2020-08-04 (×9): 800 mg via ORAL
  Filled 2020-08-01 (×9): qty 1

## 2020-08-01 MED ORDER — METHYLERGONOVINE MALEATE 0.2 MG PO TABS: 0.2000 mg | ORAL_TABLET | ORAL | Status: DC | PRN

## 2020-08-01 MED ORDER — NALBUPHINE HCL 10 MG/ML IJ SOLN
5.0000 mg | Freq: Once | INTRAMUSCULAR | Status: AC | PRN
  Administered 2020-08-01: 5 mg via INTRAVENOUS

## 2020-08-01 MED ORDER — SOD CITRATE-CITRIC ACID 500-334 MG/5ML PO SOLN
30.0000 mL | Freq: Once | ORAL | Status: AC
  Administered 2020-08-01: 30 mL via ORAL
  Filled 2020-08-01: qty 15

## 2020-08-01 SURGICAL SUPPLY — 33 items
ADH SKN CLS LQ APL DERMABOND (GAUZE/BANDAGES/DRESSINGS) ×2
CHLORAPREP W/TINT 26ML (MISCELLANEOUS) ×3 IMPLANT
CLAMP CORD UMBIL (MISCELLANEOUS) IMPLANT
DERMABOND ADHESIVE PROPEN (GAUZE/BANDAGES/DRESSINGS) ×4
DERMABOND ADVANCED .7 DNX6 (GAUZE/BANDAGES/DRESSINGS) IMPLANT
DRSG OPSITE POSTOP 4X10 (GAUZE/BANDAGES/DRESSINGS) ×3 IMPLANT
ELECT REM PT RETURN 9FT ADLT (ELECTROSURGICAL) ×3
ELECTRODE REM PT RTRN 9FT ADLT (ELECTROSURGICAL) ×1 IMPLANT
EXTRACTOR VACUUM M CUP 4 TUBE (SUCTIONS) IMPLANT
EXTRACTOR VACUUM M CUP 4' TUBE (SUCTIONS)
GLOVE BIOGEL PI IND STRL 6.5 (GLOVE) ×1 IMPLANT
GLOVE BIOGEL PI IND STRL 7.0 (GLOVE) ×1 IMPLANT
GLOVE BIOGEL PI INDICATOR 6.5 (GLOVE) ×2
GLOVE BIOGEL PI INDICATOR 7.0 (GLOVE) ×2
GLOVE SURG SS PI 6.5 STRL IVOR (GLOVE) ×3 IMPLANT
GOWN STRL REUS W/TWL LRG LVL3 (GOWN DISPOSABLE) ×6 IMPLANT
KIT ABG SYR 3ML LUER SLIP (SYRINGE) IMPLANT
MAT PREVALON FULL STRYKER (MISCELLANEOUS) ×2 IMPLANT
NDL HYPO 25X5/8 SAFETYGLIDE (NEEDLE) IMPLANT
NEEDLE HYPO 25X5/8 SAFETYGLIDE (NEEDLE) IMPLANT
NS IRRIG 1000ML POUR BTL (IV SOLUTION) ×3 IMPLANT
PACK C SECTION WH (CUSTOM PROCEDURE TRAY) ×3 IMPLANT
PAD OB MATERNITY 4.3X12.25 (PERSONAL CARE ITEMS) ×3 IMPLANT
PENCIL SMOKE EVAC W/HOLSTER (ELECTROSURGICAL) ×3 IMPLANT
RETRACTOR TRAXI PANNICULUS (MISCELLANEOUS) IMPLANT
RTRCTR C-SECT PINK 25CM LRG (MISCELLANEOUS) IMPLANT
SUT PLAIN 0 NONE (SUTURE) IMPLANT
SUT VIC AB 0 CT1 36 (SUTURE) ×12 IMPLANT
SUT VIC AB 4-0 KS 27 (SUTURE) ×5 IMPLANT
TOWEL OR 17X24 6PK STRL BLUE (TOWEL DISPOSABLE) ×3 IMPLANT
TRAXI PANNICULUS RETRACTOR (MISCELLANEOUS) ×2
TRAY FOLEY W/BAG SLVR 14FR LF (SET/KITS/TRAYS/PACK) ×3 IMPLANT
WATER STERILE IRR 1000ML POUR (IV SOLUTION) ×5 IMPLANT

## 2020-08-01 NOTE — Lactation Note (Signed)
This note was copied from a baby's chart. Lactation Consultation Note  Patient Name: Joann Meyers [id number]'[id number] Date: 08/01/2020   Attempted to visit with mom but RN Caryl Pina was working with her in the room and let [place] know that mom is not ready to see lactation, she wasn't fully conscious. LC to come back later tonight to do initial assessment.  Maternal Data    Feeding Feeding Type: Formula Nipple Type: Nfant Extra Slow Flow (gold)  LATCH Score                   Interventions    Lactation Tools Discussed/Used     Consult Status      Joann Meyers S Joann Meyers 08/01/2020, 8:03 PM

## 2020-08-01 NOTE — Op Note (Signed)
Preoperative diagnosis:  1.  Intrauterine pregnancy at [redacted] weeks gestation                                         2.  Breech Presentation                                         3.  Preeclampsia with severe features   Postoperative diagnosis:  Same as above plus   Procedure:  Primary cesarean section  Surgeon:  Florian Buff MD  Assistant:  Sharene Skeans MD   Anesthesia: Spinal  Findings:     Over a low transverse incision was delivered a viable female with Apgars of 3 and 9. Uterus, tubes and ovaries were all normal.  There were no other significant findings  Description of operation:  Patient was taken to the operating room and placed in the sitting position where she underwent a spinal anesthetic. She was then placed in the supine position with tilt to the left side. When adequate anesthetic level was obtained she was prepped and draped in usual sterile fashion and a Foley catheter was placed. A Pfannenstiel skin incision was made and carried down sharply to the rectus fascia which was scored in the midline extended laterally. The fascia was taken off the muscles both superiorly and without difficulty. The muscles were divided.  The peritoneal cavity was entered.  Bladder blade was placed, no bladder flap was created.  A low transverse hysterotomy incision was made and delivered a viable female from breech presentation. Infant Apgars of 3 and 9. Cord pH was obtained and was ph 7.326, pCO2 47.2, Bicarb 24.. The uterus was exteriorized. It was closed in 2 layers, the first being a running interlocking layer and the second being an imbricating layer using 0 monocryl on a CTX needle. There was good resulting hemostasis. The uterus tubes and ovaries were all normal. Peritoneal cavity was irrigated vigorously. The muscles and peritoneum were reapproximated loosely. The fascia was closed using 0 Vicryl in running fashion. Subcutaneous tissue was made hemostatic and irrigated. The skin was closed using  4-0 Vicryl on a Keith needle in a subcuticular fashion.  Dermabond was placed for additional wound integrity and to serve as a barrier. Blood loss for the procedure was 228 cc. The patient received a gram of Ancef prophylactically. The patient was taken to the recovery room in good stable condition with all counts being correct x3.  EBL 228 cc  Placido Sou [facility] 08/01/2020 9:11 AM

## 2020-08-01 NOTE — Discharge Summary (Addendum)
Postpartum Discharge Summary     Patient Name: Joann Meyers DOB: 02-28-1986 MRN: [id number]  Date of admission: 07/31/2020 Delivery date:08/01/2020  Delivering provider: Florian Buff  Date of discharge: 08/04/2020  Admitting diagnosis: Preeclampsia, severe [O14.10] S/P cesarean section [Z98.891] Intrauterine pregnancy: [redacted]w[redacted]d    Secondary diagnosis:  Principal Problem:   Hypertension in pregnancy, preeclampsia, severe, delivered Active Problems:   History of TIA (transient ischemic attack) and stroke   Anemia in pregnancy   Preexisting diabetes complicating pregnancy, antepartum   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HCC)   Hypertension during pregnancy, antepartum   Chronic hypertension with superimposed preeclampsia   Cesarean delivery delivered   Migraine headache   Postoperative anemia due to acute blood loss  Additional problems: Migraines    Discharge diagnosis: Preterm Pregnancy Delivered, Preeclampsia (severe) and Type 2 DM                                              Post partum procedures:none Augmentation: N/A Complications: None  Hospital course: Scheduled C/S   34y.o. yo G1P0101 at 315w3das admitted to the hospital 07/31/2020 for scheduled cesarean section with the following indication: Malpresentation, severe range BP, 10/10 headache.  Delivery details are as follows:  Delivery Method:C-Section, Low Transverse  Details of operation can be found in separate operative note.  Patient was started on IV magnesium after delivery and Procardia for her blood pressure. For her diabetes, her insulin dosage was initially reduced to half of her PTA dosing and was discharged on Levemir 20 bid and Novolog 8 qac.  Patient had some concerning neurological symptoms on POD#1, severe headache, right sided weakness and Code Stroke called. Had negative evaluation (see CT/MRI results) below, Neurology consulted. Negative EEG. They felt it could be complex migraines vs PRES. Similar symptoms  on POD#2, negative head CT. No further intervention as per Neurology. Otherwise, she is ambulating, tolerating a regular diet, passing flatus, and urinating well. Patient is discharged home in stable condition on 08/04/20.        Newborn Data: Birth date:08/01/2020  Birth time:5:43 AM  Gender:Female  Living status:Living  Apgars:3 ,9  Weight:1960 g     Magnesium Sulfate received: Yes: Seizure prophylaxis BMZ received: Yes Rhophylac:N/A MMR:N/A T-DaP:Given prenatally Flu: No Transfusion:No  Physical exam  Vitals:   08/04/20 0540 08/04/20 0600 08/04/20 0816 08/04/20 1210  BP:   (!) 157/83 133/70  Pulse:   85 86  Resp:   18 18  Temp:   98.1 F (36.7 C) 97.8 F (36.6 C)  TempSrc:   Oral Oral  SpO2: 96% 96% 97% 98%  Weight:      Height:       General: alert, cooperative and no distress Lochia: appropriate Uterine Fundus: firm Incision: Healing well with no significant drainage, No significant erythema, Dressing is clean, dry, and intact DVT Evaluation: No evidence of DVT seen on physical exam. Negative Homan's sign. No cords or calf tenderness. Labs: Lab Results  Component Value Date   WBC 9.5 08/02/2020   HGB 9.6 (L) 08/02/2020   HCT 28.9 (L) 08/02/2020   MCV 87.0 08/02/2020   PLT 136 (L) 08/02/2020   CMP Latest Ref Rng & Units 08/01/2020  Glucose 70 - 99 mg/dL 261(H)  BUN 6 - 20 mg/dL 17  Creatinine 0.44 - 1.00 mg/dL 0.54  Sodium 135 - 145 mmol/L 136  Potassium 3.5 - 5.1 mmol/L 4.0  Chloride 98 - 111 mmol/L 106  CO2 22 - 32 mmol/L 21(L)  Calcium 8.9 - 10.3 mg/dL 8.9  Total Protein 6.5 - 8.1 g/dL 6.0(L)  Total Bilirubin 0.3 - 1.2 mg/dL 0.3  Alkaline Phos 38 - 126 U/L 62  AST 15 - 41 U/L 26  ALT 0 - 44 U/L 23   Edinburgh Score: Edinburgh Postnatal Depression Scale Screening Tool 08/03/2020  I have been able to laugh and see the funny side of things. 0  I have looked forward with enjoyment to things. 0  I have blamed myself unnecessarily when things went  wrong. 0  I have been anxious or worried for no good reason. 0  I have felt scared or panicky for no good reason. 0  Things have been getting on top of me. 0  I have been so unhappy that I have had difficulty sleeping. 0  I have felt sad or miserable. 1  I have been so unhappy that I have been crying. 0  The thought of harming myself has occurred to me. 0  Edinburgh Postnatal Depression Scale Total 1     After visit meds:  Allergies as of 08/04/2020      Reactions   Morphine And Related Anaphylaxis   Shellfish Allergy Anaphylaxis   "Swell up and can't breath"   Blain Pais Allergy] Anaphylaxis   "couldn't breath"   Feraheme [ferumoxytol] Other (See Comments)   Hx of reaction- myalgia ble and lower back   Latex Dermatitis      Medication List    STOP taking these medications   labetalol 200 MG tablet Commonly known as: NORMODYNE   Levemir FlexTouch 100 UNIT/ML FlexPen Generic drug: insulin detemir Replaced by: insulin detemir 100 UNIT/ML injection   pantoprazole 20 MG tablet Commonly known as: PROTONIX   polyethylene glycol 17 g packet Commonly known as: MIRALAX / GLYCOLAX     TAKE these medications   Accu-Chek Guide w/Device Kit USE AS DIRECTED   Accu-Chek Softclix Lancets lancets 100 each by Other route 4 (four) times daily. Use as instructed   acetaminophen 500 MG tablet Commonly known as: TYLENOL Take 1 tablet (500 mg total) by mouth every 6 (six) hours as needed.   aspirin EC 81 MG tablet Take 2 tablets (162 mg total) by mouth daily.   blood glucose meter kit and supplies Dispense based on patient and insurance preference. Use up to four times daily as directed. (FOR ICD-10 E10.9, E11.9).   ferrous sulfate 325 (65 FE) MG tablet Take 1 tablet (325 mg total) by mouth daily with breakfast.   glucose blood test strip Use as instructed   ibuprofen 800 MG tablet Commonly known as: ADVIL Take 1 tablet (800 mg total) by mouth 3 (three) times daily with  meals as needed for headache, moderate pain or cramping.   insulin aspart 100 UNIT/ML injection Commonly known as: novoLOG Inject 8 Units into the skin 3 (three) times daily with meals. What changed:   how much to take  when to take this  Another medication with the same name was removed. Continue taking this medication, and follow the directions you see here.   insulin detemir 100 UNIT/ML injection Commonly known as: LEVEMIR Inject 0.2 mLs (20 Units total) into the skin 2 (two) times daily. Replaces: Levemir FlexTouch 100 UNIT/ML FlexPen   metoCLOPramide 10 MG tablet Commonly known as: REGLAN Take 1 tablet (10 mg total) by mouth 4 (four) times  daily as needed for nausea or vomiting.   NIFEdipine 60 MG 24 hr tablet Commonly known as: ADALAT CC Take 1 tablet (60 mg total) by mouth daily. Start taking on: August 05, 2020   ondansetron 4 MG disintegrating tablet Commonly known as: ZOFRAN-ODT Take 1 tablet (4 mg total) by mouth every 6 (six) hours as needed for nausea or vomiting. What changed: when to take this   oxyCODONE 5 MG immediate release tablet Commonly known as: Oxy IR/ROXICODONE Take 1 tablet (5 mg total) by mouth every 4 (four) hours as needed for severe pain or breakthrough pain.   Prenatabs Rx 29-1 MG Tabs Take 1 tablet by mouth daily.   senna-docusate 8.6-50 MG tablet Commonly known as: Senokot-S Take 2 tablets by mouth daily. Start taking on: August 05, 2020   Vitamin D (Ergocalciferol) 1.25 MG (50000 UNIT) Caps capsule Commonly known as: DRISDOL Take 1 capsule (50,000 Units total) by mouth every 7 (seven) days for 11 doses.            Discharge Care Instructions  (From admission, onward)         Start     Ordered   08/04/20 0000  Discharge wound care:       Comments: As per discharge handout and nursing instructions   08/04/20 1308           Discharge home in stable condition Infant Feeding: Bottle and Breast Infant Disposition: in  NICU Discharge instruction: per After Visit Summary and Postpartum booklet. Activity: Advance as tolerated. Pelvic rest for 6 weeks.  Diet: routine diet Future Appointments: Future Appointments  Date Time Provider [place]  08/09/2020  1:30 PM [facility]  08/18/2020  2:00 PM [facility] NURSE [facility]  09/01/2020  1:15 PM Griffin Basil, MD [facility]   Follow up Visit:   Please schedule this patient for a In person postpartum visit in 6 weeks with the following provider: MD. Additional Postpartum F/U:Incision check 1 week and BP check 1 week  High risk pregnancy complicated by: HTN and type 2 diabetes, hx of TIA/Stroke, Severe PEC  Delivery mode:  C-Section, Low Transverse  Anticipated Birth Control:  Unsure   08/04/2020 Verita Schneiders, MD
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
## 2020-08-01 NOTE — Anesthesia Preprocedure Evaluation (Signed)
Anesthesia Evaluation  Patient identified by MRN, date of birth, ID band  Reviewed: Allergy & Precautions, NPO status , Patient's Chart, lab work & pertinent test results  Airway Mallampati: III  TM Distance: >3 FB Neck ROM: Full    Dental no notable dental hx. (+) Teeth Intact, Dental Advisory Given   Pulmonary neg pulmonary ROS,    Pulmonary exam normal breath sounds clear to auscultation       Cardiovascular hypertension, Pt. on medications and Pt. on home beta blockers Normal cardiovascular exam Rhythm:Regular Rate:Normal  Chronic HTN  Pre E   Neuro/Psych Seizures -,  TIAnegative psych ROS   GI/Hepatic negative GI ROS, Neg liver ROS,   Endo/Other  diabetes, Type 2  Renal/GU negative Renal ROS     Musculoskeletal   Abdominal (+) + obese,   Peds  Hematology  (+) anemia , Lab Results      Component                Value               Date                      WBC                      9.4                 08/01/2020                HGB                      9.6 (L)             08/01/2020                HCT                      29.7 (L)            08/01/2020                MCV                      86.8                08/01/2020                PLT                      171                 08/01/2020             T&S avail   Anesthesia Other Findings   Reproductive/Obstetrics (+) Pregnancy                             Anesthesia Physical Anesthesia Plan  ASA: III  Anesthesia Plan: Spinal   Post-op Pain Management:    Induction:   PONV Risk Score and Plan: 3 and Treatment may vary due to age or medical condition, Ondansetron and Scopolamine patch - Pre-op  Airway Management Planned: Nasal Cannula and Natural Airway  Additional Equipment: None  Intra-op Plan:   Post-operative Plan:   Informed Consent: I have reviewed the patients History and Physical, chart, labs and discussed the  procedure including the risks, benefits  and alternatives for the proposed anesthesia with the patient or authorized representative who has indicated his/her understanding and acceptance.       Plan Discussed with: CRNA and Anesthesiologist  Anesthesia Plan Comments: (35.3 wk G1P0 with Pre E and DM for  C/S under spina')       Anesthesia Quick Evaluation

## 2020-08-01 NOTE — Progress Notes (Signed)
Patient Joann Meyers 142 at 0703 Dr. Elonda Husky notified. Hold 0800 Novolog. No other new orders.

## 2020-08-01 NOTE — Transfer of Care (Signed)
Immediate Anesthesia Transfer of Care Note  Patient: Joann Meyers  Procedure(s) Performed: CESAREAN SECTION (N/A )  Patient Location: PACU  Anesthesia Type:Spinal  Level of Consciousness: awake, alert  and oriented  Airway & Oxygen Therapy: Patient Spontanous Breathing  Post-op Assessment: Report given to RN and Post -op Vital signs reviewed and stable  Post vital signs: Reviewed and stable  Last Vitals:  Vitals Value Taken Time  BP 135/77 08/01/20 0645  Temp    Pulse    Resp 18 08/01/20 0647  SpO2    Vitals shown include unvalidated device data.  Last Pain:  Vitals:   08/01/20 0218  TempSrc:   PainSc: 10-Worst pain ever         Complications: No complications documented.

## 2020-08-01 NOTE — Anesthesia Postprocedure Evaluation (Signed)
Anesthesia Post Note  Patient: Joann Meyers  Procedure(s) Performed: CESAREAN SECTION (N/A )     Patient location during evaluation: Mother Baby Anesthesia Type: Spinal Level of consciousness: oriented and awake and alert Pain management: pain level controlled Vital Signs Assessment: post-procedure vital signs reviewed and stable Respiratory status: spontaneous breathing and respiratory function stable Cardiovascular status: blood pressure returned to baseline and stable Postop Assessment: no headache, no backache, no apparent nausea or vomiting and able to ambulate Anesthetic complications: no   No complications documented.  Last Vitals:  Vitals:   08/01/20 1600 08/01/20 1705  BP:    Pulse:    Resp: 16 18  Temp:    SpO2:      Last Pain:  Vitals:   08/01/20 1700  TempSrc:   PainSc: 0-No pain   Pain Goal: Patients Stated Pain Goal: 3 (08/01/20 1045)                 Barnet Glasgow

## 2020-08-01 NOTE — MAU Note (Signed)
OR just called. Ready for patient in OR "B."

## 2020-08-01 NOTE — Progress Notes (Signed)
Dr. Kennon Rounds notified regarding patient's new onset of drowsiness, memory impairment and delayed responses.  CBG 99 Magnesium lab ordered. No new orders will continue to monitor.

## 2020-08-01 NOTE — MAU Note (Signed)
Dr. Elonda Husky, MD, at bedside at [phone] discussing the procedure with the patient. Consent form signed.   [facility] notified at 74.  OB Anesthiologist, Dr. Valma Cava, notified at [place].  OB OR Charge Nurse, Delsa Sale, RN, notified at [phone].  NICU Charge Nurse, Larene Beach, RN, notified at [phone].

## 2020-08-01 NOTE — Anesthesia Procedure Notes (Signed)
Spinal  Patient location during procedure: OB Start time: 08/01/2020 5:10 AM End time: 08/01/2020 5:16 AM Staffing Performed: anesthesiologist  Anesthesiologist: Barnet Glasgow, MD Preanesthetic Checklist Completed: patient identified, IV checked, risks and benefits discussed, surgical consent, monitors and equipment checked, pre-op evaluation and timeout performed Spinal Block Patient position: sitting Prep: DuraPrep and site prepped and draped Patient monitoring: heart rate, cardiac monitor, continuous pulse ox and blood pressure Approach: midline Location: L3-4 Injection technique: single-shot Needle Needle type: Pencan  Needle gauge: 24 G Needle length: 10 cm Assessment Sensory level: T4 Additional Notes  2 Attempt (s). Pt tolerated procedure well.

## 2020-08-01 NOTE — Progress Notes (Signed)
Mother called in PACU and consent obtained for infant to get formula ( blood sugar-31) and medications ( Vit K and Hep B) while infant is under radiant warmer.

## 2020-08-01 NOTE — Lactation Note (Signed)
This note was copied from a baby's chart. Lactation Consultation Note Baby 18 hrs. Old. Mom is on Mag. Mom sound asleep. Spoke w/RN. RN stated she does want to see Lactation. Mom is just very sleepy d/t Mag. DEBP in rm. Not set up. Asked to call for assistance when mom awakens. Left NICU booklet and Lactation brochure as well as LPI information sheet inside of NICU booklet. LC will attempt to f/u later tonight.  Patient Name: Joann Meyers [id number] Date: 08/01/2020     Maternal Data    Feeding Feeding Type: Formula Nipple Type: Nfant Extra Slow Flow (gold)  LATCH Score                   Interventions    Lactation Tools Discussed/Used     Consult Status      Joann Meyers 08/01/2020, 11:48 PM
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
## 2020-08-02 ENCOUNTER — Inpatient Hospital Stay (HOSPITAL_COMMUNITY): Payer: Medicaid Other

## 2020-08-02 ENCOUNTER — Ambulatory Visit: Payer: Medicaid Other

## 2020-08-02 ENCOUNTER — Encounter: Payer: Self-pay | Admitting: Family Medicine

## 2020-08-02 DIAGNOSIS — Z8673 Personal history of transient ischemic attack (TIA), and cerebral infarction without residual deficits: Secondary | ICD-10-CM

## 2020-08-02 DIAGNOSIS — I7771 Dissection of carotid artery: Secondary | ICD-10-CM

## 2020-08-02 DIAGNOSIS — R569 Unspecified convulsions: Secondary | ICD-10-CM

## 2020-08-02 DIAGNOSIS — O99820 Streptococcus B carrier state complicating pregnancy: Secondary | ICD-10-CM | POA: Insufficient documentation

## 2020-08-02 DIAGNOSIS — O1414 Severe pre-eclampsia complicating childbirth: Secondary | ICD-10-CM

## 2020-08-02 LAB — CBC
HCT: 28.9 % — ABNORMAL LOW (ref 36.0–46.0)
Hemoglobin: 9.6 g/dL — ABNORMAL LOW (ref 12.0–15.0)
MCH: 28.9 pg (ref 26.0–34.0)
MCHC: 33.2 g/dL (ref 30.0–36.0)
MCV: 87 fL (ref 80.0–100.0)
Platelets: 136 10*3/uL — ABNORMAL LOW (ref 150–400)
RBC: 3.32 MIL/uL — ABNORMAL LOW (ref 3.87–5.11)
RDW: 15.8 % — ABNORMAL HIGH (ref 11.5–15.5)
WBC: 9.5 10*3/uL (ref 4.0–10.5)
nRBC: 0.4 % — ABNORMAL HIGH (ref 0.0–0.2)

## 2020-08-02 LAB — GLUCOSE, CAPILLARY
Glucose-Capillary: 129 mg/dL — ABNORMAL HIGH (ref 70–99)
Glucose-Capillary: 118 mg/dL — ABNORMAL HIGH (ref 70–99)
Glucose-Capillary: 94 mg/dL (ref 70–99)
Glucose-Capillary: 94 mg/dL (ref 70–99)
Glucose-Capillary: 127 mg/dL — ABNORMAL HIGH (ref 70–99)
Glucose-Capillary: 202 mg/dL — ABNORMAL HIGH (ref 70–99)

## 2020-08-02 LAB — GC/CHLAMYDIA PROBE AMP (~~LOC~~) NOT AT ARMC
Chlamydia: NEGATIVE
Comment: NEGATIVE
Comment: NORMAL
Neisseria Gonorrhea: NEGATIVE

## 2020-08-02 LAB — CULTURE, BETA STREP (GROUP B ONLY): Strep Gp B Culture: POSITIVE — AB

## 2020-08-02 LAB — RPR: RPR Ser Ql: NONREACTIVE

## 2020-08-02 MED ORDER — FUROSEMIDE 10 MG/ML IJ SOLN
20.0000 mg | Freq: Once | INTRAMUSCULAR | Status: AC
  Administered 2020-08-02: 20 mg via INTRAVENOUS
  Filled 2020-08-02: qty 2

## 2020-08-02 MED ORDER — ASPIRIN EC 81 MG PO TBEC
81.0000 mg | DELAYED_RELEASE_TABLET | Freq: Every day | ORAL | Status: DC
  Administered 2020-08-02 – 2020-08-04 (×3): 81 mg via ORAL
  Filled 2020-08-02 (×3): qty 1

## 2020-08-02 MED ORDER — ACETAMINOPHEN 500 MG PO TABS
1000.0000 mg | ORAL_TABLET | Freq: Once | ORAL | Status: AC
  Administered 2020-08-02: 1000 mg via ORAL
  Filled 2020-08-02: qty 2

## 2020-08-02 MED ORDER — CYCLOBENZAPRINE HCL 10 MG PO TABS
10.0000 mg | ORAL_TABLET | Freq: Once | ORAL | Status: AC
  Administered 2020-08-02: 10 mg via ORAL
  Filled 2020-08-02: qty 1

## 2020-08-02 MED ORDER — SODIUM CHLORIDE 0.9 % IV SOLN
2000.0000 mg | Freq: Once | INTRAVENOUS | Status: AC
  Administered 2020-08-02: 2000 mg via INTRAVENOUS
  Filled 2020-08-02 (×2): qty 20

## 2020-08-02 MED ORDER — ENOXAPARIN SODIUM 60 MG/0.6ML ~~LOC~~ SOLN
50.0000 mg | SUBCUTANEOUS | Status: DC
  Administered 2020-08-02: 50 mg via SUBCUTANEOUS
  Filled 2020-08-02: qty 0.6

## 2020-08-02 MED ORDER — ENOXAPARIN SODIUM 40 MG/0.4ML ~~LOC~~ SOLN: 40.0000 mg | SUBCUTANEOUS | Status: DC

## 2020-08-02 NOTE — Progress Notes (Signed)
Subjective: Postpartum Day 1: Cesarean Delivery Patient reports incisional pain and tolerating PO.    Objective: Vital signs in last 24 hours: Temp:  [97.7 F (36.5 C)-98.6 F (37 C)] 98.6 F (37 C) (10/18 0358) Pulse Rate:  [61-93] 74 (10/18 0358) Resp:  [0-31] 18 (10/18 0600) BP: (97-124)/(54-74) 107/65 (10/18 0358) SpO2:  [89 %-99 %] 99 % (10/18 0358) Weight:  [104.2 kg] 104.2 kg (10/17 1010)  Physical Exam:  General: alert, cooperative and no distress Lochia: appropriate Uterine Fundus: firm Incision: healing well, no significant drainage, no dehiscence, no significant erythema DVT Evaluation: No evidence of DVT seen on physical exam.  Recent Labs    08/01/20 0041 08/02/20 0653  HGB 9.6* 9.6*  HCT 29.7* 28.9*    Assessment/Plan: Status post Cesarean section. Doing well postoperatively.  Continue current care: Magnesium stopped an hour ago, continue procardia 30 XL daily, insulin regimen at 1/2 prenatal dosing which seems to be reasonable dosing  Anticipate discharge in 2 days.  Florian Buff 08/02/2020, 7:41 AM

## 2020-08-02 NOTE — Progress Notes (Signed)
EEG complete - results pending 
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
## 2020-08-02 NOTE — Progress Notes (Signed)
Pt in MRI notifed OB unit to let RN know I can come over and get EEG when pt is back
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
## 2020-08-02 NOTE — Lactation Note (Addendum)
This note was copied from a baby's chart. Lactation Consultation Note  Patient Name: Girl Tariya Morrissette [id number] Date: 08/02/2020 Reason for consult: Initial assessment;1st time breastfeeding;NICU baby;Late-preterm 34-36.6wks LC to room for initial consult. Mom was not in room. Set up pump and provided pumping supplies. Per RN, mom was feeling unwell and transferred for higher level of care. She has not initiated pumping. Will plan f/u when she is feeling better. Spoke with RN and she will assist mom with initiating pumping/hand expression prn.  Maternal Data Does the patient have breastfeeding experience prior to this delivery?: No  Interventions Interventions:  (pump set up )    Consult Status Consult Status: Follow-up Date: 08/02/20 Follow-up type: In-patient    [name] 08/02/2020, 10:03 AM

## 2020-08-02 NOTE — Progress Notes (Signed)
0910 patient called out with HA 10/10. Upon assessment pt presented with right sided weakness, slurred speech, right sided facial numbness, left sided mouth droop, swollen tongue, heavy eyelids, tachypnea with O2 sat 91%. Dr, Domenica Reamer on unit, called for assessment. 0922 Code Stroke called (see code note). Patient returned to unit from CT/MRI at 1100 am. Transferred to room 105 for closer observation.

## 2020-08-02 NOTE — Progress Notes (Signed)
CSW completed chart review and attempted to meet with MOB. Due to MOB's current medical condition, MOB was not available to complete a clinical assessment with CSW. Bedside RN agreed to keep CSW updated.   Laurey Arrow, MSW, LCSW Clinical Social Work [phone]

## 2020-08-02 NOTE — Progress Notes (Signed)
Faculty Practice OB/GYN Attending Note (late entry)  Called by RN to evaluate patient with right sided weakness and loss of sensation that started around 0900.  Patient is now in bed, speech is slurred and has a a left sided droop.  Rapid Response Stroke RN called, then Code Stroke. Patient was taken to CT scan, no acute hemorrhage seen after evaluation by Neurologist. MRI/MRA ordered.  Patient noted to be stable through out. The plan for now is to load with Keppra (as per Neurologist), leading diagnosis is possible PRES in the setting of known severe preeclampsia. Will follow up MR read, currently ongoing. If no concerning abnormalities, will transfer patient back to [facility] and continue close observation.   Verita Schneiders, MD, [place] for [employer], [place]

## 2020-08-02 NOTE — Progress Notes (Signed)
Patient screened out for psychosocial assessment since none of the following apply: °Psychosocial stressors documented in mother or baby's chart °Gestation less than 32 weeks °Code at delivery  °Infant with anomalies °Please contact the Clinical Social Worker if specific needs arise, by MOB's request, or if MOB scores greater than 9/yes to question 10 on Edinburgh Postpartum Depression Screen. ° °Tayshun Gappa Boyd-Gilyard, MSW, LCSW °Clinical Social Work °[phone] °  °

## 2020-08-03 ENCOUNTER — Inpatient Hospital Stay (HOSPITAL_COMMUNITY): Payer: Medicaid Other

## 2020-08-03 LAB — GLUCOSE, CAPILLARY
Glucose-Capillary: 101 mg/dL — ABNORMAL HIGH (ref 70–99)
Glucose-Capillary: 94 mg/dL (ref 70–99)
Glucose-Capillary: 123 mg/dL — ABNORMAL HIGH (ref 70–99)
Glucose-Capillary: 98 mg/dL (ref 70–99)

## 2020-08-03 LAB — LDL CHOLESTEROL, DIRECT: Direct LDL: 99.6 mg/dL — ABNORMAL HIGH (ref 0–99)

## 2020-08-03 MED ORDER — NIFEDIPINE ER OSMOTIC RELEASE 30 MG PO TB24
60.0000 mg | ORAL_TABLET | Freq: Every day | ORAL | Status: DC
  Administered 2020-08-03 – 2020-08-04 (×2): 60 mg via ORAL
  Filled 2020-08-03 (×2): qty 2

## 2020-08-03 MED ORDER — ENOXAPARIN SODIUM 40 MG/0.4ML ~~LOC~~ SOLN
40.0000 mg | SUBCUTANEOUS | Status: DC
  Administered 2020-08-04: 40 mg via SUBCUTANEOUS
  Filled 2020-08-03 (×2): qty 0.4

## 2020-08-03 MED ORDER — METOCLOPRAMIDE HCL 5 MG/ML IJ SOLN
10.0000 mg | Freq: Once | INTRAMUSCULAR | Status: AC
  Administered 2020-08-03: 10 mg via INTRAVENOUS
  Filled 2020-08-03: qty 2

## 2020-08-03 NOTE — Progress Notes (Signed)
Subjective: Symptoms have resolved.   Has had some hand numbness for most of her pregnancy  Exam: Vitals:   08/03/20 0600 08/03/20 0819  BP:  (!) 150/73  Pulse:  85  Resp:  20  Temp:  98.3 F (36.8 C)  SpO2: 97% 98%   Gen: In bed, holding baby.  Resp: non-labored breathing, no acute distress Abd: soft, nt  Neuro: MS: awake, alert, interactive and appropriate CN: PERRL, EOMI Motor: MAEW Sensory:reduced in median nerve distribution.    Impression: 34 year old female with transient right symptoms in the setting of elevated blood pressure and headache.  The findings on the MRI I think are more likely to be artifactual, in any case did not correspond to the symptoms that she displayed.  She is already on antiplatelet therapy with aspirin.  She does have significant changes concerning for small vessel disease on her MRI, and with her history of stroke I would favor over the long-term starting her on statin therapy in any case.  If she is going to be breast-feeding, this would need to be taken into consideration, but when she is done breast-feeding I would favor starting statin. MRI negative pres is a consideration, but I think that migraine is a more likely culprit.   Hand numbness fits a median distribution, if no improvement now that pregnancy is done, could consider outpatient EMG.   Recommendations: 1) continue antiplatelet therapy with aspirin 2) consider statin therapy once breastfeeding is done.  3) consider reglan 10mg  IV for headache.  4) will have her follow up with outpatient neurology.   Roland Rack, MD [facility] [phone]  If 7pm- 7am, please page neurology on call as listed in [place].

## 2020-08-03 NOTE — Progress Notes (Signed)
Faculty Practice OB/GYN Attending Note  Called to evaluate with sudden onset of severe headache on her R 10/10 while in NICU. Also facial weakness, left lip droop. Able to move extremities.  BP 175/88. Neurology (Dr. Leonel Ramsay) notifed, he will come and assess.   Verita Schneiders, MD, [place] for [employer], [place]

## 2020-08-03 NOTE — Progress Notes (Signed)
Late entry, around non, she had recurrence of a right temporal throbbing headache and in that setting she had some left-sided weakness.  She had an NIH of two and therefore was not an IV TPA candidate due to mild symptoms.  Following administration of Reglan, both her headache resolved and her weakness resolved.  I suspect at this point that this represents complicated migraine given the stereotypical headache and worsening of the symptoms associated with worsening of headache and resolution with treatment.  With resolution of symptoms, I do not think a repeat MRI is likely to be helpful.  I would continue antiplatelet therapy, consideration of statin.  For headache, could give Reglan 10 mg IV as needed.  Roland Rack, MD [facility] [phone]  If 7pm- 7am, please page neurology on call as listed in [place].

## 2020-08-03 NOTE — Progress Notes (Signed)
Pt to CT via transport.

## 2020-08-03 NOTE — Clinical Social Work Maternal (Signed)
CLINICAL SOCIAL WORK MATERNAL/CHILD NOTE  Patient Details  Name: Sharene Krikorian MRN: [id number] Date of Birth: [birth year]  Date:  08/03/2020  Clinical Social Worker Initiating Note:  Laurey Arrow Date/Time: Initiated:  08/03/20/1103     Child's Name:  Levada Schilling   Biological Parents:  Mother, Father (FOB is Katina Dung Enright 08/18/1987 (Per MOB, FOB lives in [place]).)   Need for Interpreter:  None   Reason for Referral:  Other (Comment) (Community concerns regarding MOB's ablity to care for infant)   Address:  [address] [place] [address]    Phone number: MOB's number is [phone]   Additional phone number:   Household Members/Support Persons (HM/SP):    (MOB reported that she lives with her mother, brother, and sister in law.)   HM/SP Name Relationship DOB or Age  HM/SP -1        HM/SP -2        HM/SP -3        HM/SP -4        HM/SP -5        HM/SP -6        HM/SP -7        HM/SP -8          Natural Supports (not living in the home):  Immediate Family   Professional Supports: None   Employment: Part-time   Type of Work: [employer] on [employer].   Education:  9 to 11 years (MOB reported her last grade completed was 10th grade.)   Homebound arranged: No  Financial Resources:  Medicaid   Other Resources:  Physicist, medical  , [facility]   Cultural/Religious Considerations Which May Impact Care:  Per [employer] Face Sheet  Muslim.   Strengths:  Ability to meet basic needs  , Pediatrician chosen, Home prepared for child     Psychotropic Medications:         Pediatrician:    [place]  Pediatrician List:   [facility] for [place]      Pediatrician Fax Number:    Risk Factors/Current Problems:  None   Cognitive State:  Alert  , Able to Concentrate  , Linear Thinking  , Insightful  , Goal Oriented     Mood/Affect:  Interested  ,  Calm  , Happy  , Relaxed     CSW Assessment: CSW met with MOB in infant's room in room 342. When CSW arrived, MOB was bonding with infant as evidence by holding and engaging in infant massages; everyone appeared happy and comfortable. CSW explained CSW's role and encouraged MOB to ask questions anytime during the clinical assessment. MOB was polite, easy to engage, and receptive to meeting with CSW. MOB also appeared appropriate with infant and was appropriate to answering CSW's questions. MOB reported having a good understanding about her health as well as infant's health. MOB was able to share why infant was not transferred back to MOB's room and MOB sound accurate. MOB reported having a good support team consisting of MOB's mother, brother, and sister in law (all of whom live in the home with MOB).  MOB also communicated that her sister lives a few houses down that will also provide support of needed. Per MOB, FOB resides in [place] and will support MOB and infant from a distance. MOB communicated having all essential items for infant including a  new car seat and a bassinet. MOB reported that her brother will be bringing her new car seat from [facility] prior to infant's discharge. MOB identified [facility] for peds follow-up and she denied any barriers to taking infant to scheduled appointments. CSW offered community resources and MOB was open to a referral to the [employer] (referral sent to [facility]). MOB denied all psychosocial stressors, concerns, and needs. CSW will continue to offer resources and supports to family while infant remains in NICU.    CSW Plan/Description:  Sudden Infant Death Syndrome (SIDS) Education, Perinatal Mood and Anxiety Disorder (PMADs) Education, Psychosocial Support and Ongoing Assessment of Needs, Other Patient/Family Education, Other Information/Referral to [employer], MSW, [employer] Clinical Social  Work [phone]  Dimple Nanas, LCSW 08/03/2020, 11:30 AM

## 2020-08-03 NOTE — Progress Notes (Signed)
Dr. Elonda Husky notified about patient nose bleed. RN instructed to hold the morning dose of Lovenox, have the patient sit upright while holding nares for 10 minutes. RN will continue to monitor.

## 2020-08-03 NOTE — Progress Notes (Signed)
Subjective: Postpartum Day 2: Cesarean Delivery Patient reports incisional pain, tolerating PO, + flatus and no problems voiding.    Objective: Vital signs in last 24 hours: Temp:  [98.2 F (36.8 C)-98.7 F (37.1 C)] 98.3 F (36.8 C) (10/19 0529) Pulse Rate:  [84-90] 84 (10/19 0529) Resp:  [16-20] 20 (10/19 0529) BP: (120-147)/(62-76) 147/76 (10/19 0529) SpO2:  [92 %-99 %] 97 % (10/19 0600)  Physical Exam:  General: alert, cooperative and no distress Lochia: appropriate Uterine Fundus: firm Incision: healing well, no significant drainage, no dehiscence, no significant erythema DVT Evaluation: No evidence of DVT seen on physical exam.  Recent Labs    08/01/20 0041 08/02/20 0653  HGB 9.6* 9.6*  HCT 29.7* 28.9*    Assessment/Plan: Status post Cesarean section. Doing well postoperatively.  Continue current care. Lovenox held with 3 episodes of nosebleeds, resolved about 4 am Continue procardia xl 30 daily Continue levemir 20 BID, novolog 12 w/AC  Anticipate discharge tomorrow Florian Buff 08/03/2020, 7:36 AM

## 2020-08-03 NOTE — Progress Notes (Signed)
1142 pt visiting in NICU complaining of severe headache, eyelid heaviness and "like I did yesterday".   13 Dr. Harolyn Rutherford called from NICU with report of patient's symptoms- pt transported back to 105 1152 BP 175/88 HR 92 SPO2 91% on RA, 2LNC placed. Dr Harolyn Rutherford at bedside.  1155 Dr. Birder Robson at bedside  1200 Reglan 10mg  IV.   1202 Head CT ordered, awaiting transport from CT  1206 BP 144/68 SPO2 97% HR 81. 1.5LNC

## 2020-08-03 NOTE — Lactation Note (Signed)
This note was copied from a baby's chart. Lactation Consultation Note  Patient Name: Joann Meyers GNFAO'Z Date: 08/03/2020 Reason for consult: Initial assessment;Primapara;1st time breastfeeding;NICU baby;Late-preterm 34-36.6wks;Maternal endocrine disorder Type of Endocrine Disorder?: Diabetes  LC to room for initial consult. Pt has initiated pumping. Reviewed pumping basics: pump q 3 hours, safe collection and storage, cleaning pump pieces. Pt participated in [facility] during pregnancy. [facility] faxed referral form for [facility] pump with pt's consent. Pt offered opportunity to ask questions. All concerns were addressed. Pt left to visit baby in NICU. LC will plan f/u visit.  Maternal Data Formula Feeding for Exclusion: Yes Reason for exclusion: Admission to Intensive Care Unit (ICU) post-partum Has patient been taught Hand Expression?: Yes Does the patient have breastfeeding experience prior to this delivery?: No  Feeding Feeding Type: Formula Nipple Type: Nfant Extra Slow Flow (gold)   Interventions Interventions: Breast feeding basics reviewed;DEBP  Lactation Tools Discussed/Used WIC Program: Yes Pump Review: Setup, frequency, and cleaning;Milk Storage Initiated by:: LMiller Date initiated:: 08/03/20   Consult Status Consult Status: Follow-up Date: 08/04/20 Follow-up type: In-patient    [name] 08/03/2020, 9:25 AM

## 2020-08-04 ENCOUNTER — Other Ambulatory Visit (HOSPITAL_COMMUNITY): Payer: Self-pay | Admitting: Obstetrics & Gynecology

## 2020-08-04 DIAGNOSIS — G43009 Migraine without aura, not intractable, without status migrainosus: Secondary | ICD-10-CM

## 2020-08-04 DIAGNOSIS — G43909 Migraine, unspecified, not intractable, without status migrainosus: Secondary | ICD-10-CM | POA: Diagnosis present

## 2020-08-04 LAB — GLUCOSE, CAPILLARY
Glucose-Capillary: 114 mg/dL — ABNORMAL HIGH (ref 70–99)
Glucose-Capillary: 46 mg/dL — ABNORMAL LOW (ref 70–99)
Glucose-Capillary: 72 mg/dL (ref 70–99)
Glucose-Capillary: 103 mg/dL — ABNORMAL HIGH (ref 70–99)
Glucose-Capillary: 154 mg/dL — ABNORMAL HIGH (ref 70–99)

## 2020-08-04 MED ORDER — OXYCODONE HCL 5 MG PO TABS: 5.0000 mg | ORAL_TABLET | ORAL | 0 refills | Status: DC | PRN

## 2020-08-04 MED ORDER — METOCLOPRAMIDE HCL 10 MG PO TABS: 10.0000 mg | ORAL_TABLET | Freq: Four times a day (QID) | ORAL | 2 refills | Status: DC | PRN

## 2020-08-04 MED ORDER — INSULIN ASPART 100 UNIT/ML ~~LOC~~ SOLN: 8.0000 [IU] | Freq: Three times a day (TID) | SUBCUTANEOUS | Status: DC

## 2020-08-04 MED ORDER — ONDANSETRON 4 MG PO TBDP: 4.0000 mg | ORAL_TABLET | Freq: Four times a day (QID) | ORAL | 0 refills | Status: DC | PRN

## 2020-08-04 MED ORDER — INSULIN DETEMIR 100 UNIT/ML ~~LOC~~ SOLN: 20.0000 [IU] | Freq: Two times a day (BID) | SUBCUTANEOUS | 11 refills | Status: DC

## 2020-08-04 MED ORDER — NIFEDIPINE ER 60 MG PO TB24
60.0000 mg | ORAL_TABLET | Freq: Every day | ORAL | 2 refills | Status: DC
Start: 2020-08-05 — End: 2020-08-09

## 2020-08-04 MED ORDER — SENNOSIDES-DOCUSATE SODIUM 8.6-50 MG PO TABS
2.0000 | ORAL_TABLET | ORAL | 2 refills | Status: DC
Start: 2020-08-05 — End: 2021-01-14

## 2020-08-04 MED ORDER — FERROUS SULFATE 325 (65 FE) MG PO TABS: 325.0000 mg | ORAL_TABLET | Freq: Every day | ORAL | 3 refills | Status: DC

## 2020-08-04 MED ORDER — INSULIN ASPART 100 UNIT/ML ~~LOC~~ SOLN: 8.0000 [IU] | Freq: Three times a day (TID) | SUBCUTANEOUS | 11 refills | Status: DC

## 2020-08-04 MED ORDER — IBUPROFEN 800 MG PO TABS: 800.0000 mg | ORAL_TABLET | Freq: Three times a day (TID) | ORAL | 2 refills | Status: DC | PRN

## 2020-08-04 NOTE — Lactation Note (Signed)
This note was copied from a baby's chart. Lactation Consultation Note  Patient Name: Girl Elizebath Wever XOVAN'V Date: 08/04/2020 Reason for consult: Follow-up assessment   Mother paged for assistance with problem with her pump. Arrived in her room and observed that both of her membranes were in the bottles and were not attached.   When through all the pump parts and taught mother how to connect and take apart. Advised mother in cleaning and drying parts. Mother assist with pumping. Advised to pump q 2-3 hours . Mother reports that she has not pumped since last night due to her pump not working.   Encouraged mother to hand express before and after pumping.  Mother had no other question or concerns after pumping.    Maternal Data    Feeding Feeding Type: Formula Nipple Type: Nfant Extra Slow Flow (gold)  LATCH Score                   Interventions Interventions: DEBP  Lactation Tools Discussed/Used     Consult Status Consult Status: Follow-up Date: 08/05/20 Follow-up type: In-patient    Jess Barters [facility] 08/04/2020, 10:34 AM

## 2020-08-04 NOTE — Progress Notes (Signed)
Blood sugar recheck is 72. Pt reports feeling better. Toya Smothers, RN

## 2020-08-04 NOTE — Progress Notes (Signed)
Inpatient Diabetes Program Recommendations  AACE/ADA: New Consensus Statement on Inpatient Glycemic Control (2015)  Target Ranges:  Prepandial:   less than 140 mg/dL      Peak postprandial:   less than 180 mg/dL (1-2 hours)      Critically ill patients:  140 - 180 mg/dL   Lab Results  Component Value Date   GLUCAP 72 08/04/2020   HGBA1C 7.5 (H) 07/12/2020    Review of Glycemic Control Results for Joann Meyers, Joann Meyers (MRN [id number]) as of 08/04/2020 09:47  Ref. Range 08/04/2020 05:30 08/04/2020 09:16 08/04/2020 09:30  Glucose-Capillary Latest Ref Range: 70 - 99 mg/dL 114 (H) 46 (L) 72  Diabetes history: DM2 Outpatient Diabetes medications: Levemir 40 units BID Novolog 25 units tid with meals Current orders for Inpatient glycemic control: Levemir 20units BID Novolog12units TID with meals  Inpatient Diabetes Program Recommendations:    Noted hypoglycemic episode of 46 mg/dL following meal coverage this AM.  Consider decreasing Novolog to 8 units TID (assuming patient is consuming >50% of meals).  May also want to add Novolog 0-6 units TID & HS.   Thanks, Bronson Curb, MSN, RNC-OB Diabetes Coordinator [phone] (8a-5p)

## 2020-08-04 NOTE — Plan of Care (Signed)
Patient to be discharged with printed instructions. No concerns noted. Dalphine Cowie L Jaxsun Ciampi, RN  

## 2020-08-04 NOTE — Progress Notes (Signed)
Pt reporting feeling dizzy and diaphoretic. BS obtained which is 46.   8oz apple juice given Phillip Heal crackers Peanut butter   Will recheck in 15 min.  Toya Smothers, RN

## 2020-08-04 NOTE — Discharge Instructions (Signed)

## 2020-08-04 NOTE — Progress Notes (Signed)
Today she is not complaining of HA and exam shows no localizing or lateralizing deficits. At this time neurology will sign off. Please call with any questions.  Etta Quill PA-C [facility] [phone]  M-F  (9:00 am- 5:00 PM)  08/04/2020, 10:11 AM

## 2020-08-04 NOTE — Progress Notes (Signed)
Dr. Harolyn Rutherford notified of BS [id number]. No new orders given. Will continue to monitor. Toya Smothers, RN

## 2020-08-04 NOTE — Progress Notes (Signed)
Instructed by Dr. Harolyn Rutherford to hold 12units of Novalog with lunch. BS 103. Toya Smothers, RN

## 2020-08-05 ENCOUNTER — Ambulatory Visit: Payer: Medicaid Other

## 2020-08-05 ENCOUNTER — Ambulatory Visit: Payer: Self-pay

## 2020-08-05 NOTE — Lactation Note (Signed)
This note was copied from a baby's chart. Lactation Consultation Note  Patient Name: Joann Meyers KSKSH'N Date: 08/05/2020   Joann Meyers was not present in the baby's room when lactation was rounding in the NICU this afternoon. [name] is now 72 days old. Lactation to follow up this week to check on progress with pumping and milk production.    Lenore Manner 08/05/2020, 2:48 PM

## 2020-08-06 ENCOUNTER — Encounter: Payer: Self-pay | Admitting: Obstetrics & Gynecology

## 2020-08-06 ENCOUNTER — Encounter: Payer: Medicaid Other | Admitting: Obstetrics and Gynecology

## 2020-08-06 DIAGNOSIS — D62 Acute posthemorrhagic anemia: Secondary | ICD-10-CM | POA: Insufficient documentation

## 2020-08-09 ENCOUNTER — Encounter: Payer: Self-pay | Admitting: *Deleted

## 2020-08-09 ENCOUNTER — Other Ambulatory Visit: Payer: Self-pay

## 2020-08-09 ENCOUNTER — Ambulatory Visit (INDEPENDENT_AMBULATORY_CARE_PROVIDER_SITE_OTHER): Payer: Medicaid Other | Admitting: *Deleted

## 2020-08-09 VITALS — BP 156/77 | HR 93 | Ht 62.0 in | Wt 206.2 lb

## 2020-08-09 DIAGNOSIS — Z4889 Encounter for other specified surgical aftercare: Secondary | ICD-10-CM

## 2020-08-09 DIAGNOSIS — Z0130 Encounter for examination of blood pressure without abnormal findings: Secondary | ICD-10-CM

## 2020-08-09 MED ORDER — NIFEDIPINE ER OSMOTIC RELEASE 90 MG PO TB24: 90.0000 mg | ORAL_TABLET | Freq: Every day | ORAL | 1 refills | Status: DC

## 2020-08-09 NOTE — Progress Notes (Signed)
Patient was assessed and managed by nursing staff during this encounter. I have reviewed the chart and agree with the documentation and plan. I have also made any necessary editorial changes.  Verita Schneiders, MD 08/09/2020 4:53 PM

## 2020-08-09 NOTE — Progress Notes (Signed)
Pt presents for BP check and incision check s/p C/S on 08/01/20. Honeycomb dressing was removed and foul odor was observed. Incision did not appear to have swelling, redness or drainage however it was difficult to assess due to pt's level of discomfort and intolerance to manual inspection. Dermabond was observed to be starting to peel away from incision. Pt reports elevated BP @ home this morning (178/89). Today she has H/A - pain scale 7. She denies visual disturbances. BP check - 156/77, P - 93. I advised pt that I was going to get a doctor to examine her incision. I left the room to consult w/Dr. Harolyn Rutherford who was seeing other scheduled patients. I returned to the room 30 minutes later with Dr. Harolyn Rutherford and pt was not there. After searching for pt, I called her mobile number @ 3:20pm and she did not answer. I left a detailed message stating that it is important to be examined by the doctor and I was very sorry for her extended wait time. I stated that if pt could return today by 4:15, she could still be seen. If not, please call the office in the morning in order to be rescheduled.    1630  Second attempt to reach pt by phone and once again, she did not answer. I left another message stating that Dr. Harolyn Rutherford has increased her blood pressure medication (Nifedipine) to 90 mg daily. A new prescription has been sent to her pharmacy. I also stated that if she has not yet heard my previous message, to please listen to it and respond to [place]. I then called her home number and there was no answer and no voicemail.

## 2020-08-11 ENCOUNTER — Ambulatory Visit: Payer: Self-pay

## 2020-08-11 NOTE — Lactation Note (Signed)
This note was copied from a baby's chart. Lactation Consultation Note  LC returned to room at approximately 1200 to assist with infant latch in laid back biological position.   Consult Status Consult Status: Follow-up Date: 08/12/20 Follow-up type: In-patient    [name] 08/11/2020, 1:28 PM

## 2020-08-11 NOTE — Lactation Note (Signed)
This note was copied from a baby's chart. Lactation Consultation Note  Patient Name: Joann Meyers IOMBT'D Date: 08/11/2020 Reason for consult: Follow-up assessment;Mother's request;1st time breastfeeding;NICU baby;Late-preterm 34-36.6wks   LC to room to assist with first bf. LC reviewed positioning and expectations of late preterm infant. With mother's consent, LC assisted with placing her in a laid back position. LC undressed infant to dry diaper and placed her in an biological nursing position in front of R nipple. Observing appropriate feeding cues, LC assisted with sandwiching nipple to achieve deep latch. Intermittent pestering was necessary and baby suckled rhythmically for 16 minutes with audible swallowing. LC offered mother opportunity to ask questions and reviewed positioning/latch. LC offered to return prn to provide further education and assistance. Baby unlatched and appeared satisfied. Munjor placed baby sts and notified RN.   Maternal Data  Of concern: Mother lacks bilateral breast fullness in lower quadrant. She reports +breast changes during pregnancy but is yielding only small amounts of milk when pumping. She reports 8+ pumpings/day. Expected volume by day 14 should be 500+mls/24 hours. She may benefit by keeping a 24 hour pumping log. Will plan f/u visit with mom to assess further.   LATCH Score Latch: Grasps breast easily, tongue down, lips flanged, rhythmical sucking.  Audible Swallowing: A few with stimulation  Type of Nipple: Everted at rest and after stimulation  Comfort (Breast/Nipple): Soft / non-tender  Hold (Positioning): Assistance needed to correctly position infant at breast and maintain latch.  LATCH Score: 8  Interventions Interventions: Breast feeding basics reviewed;Support pillows;Assisted with latch;Position options;Skin to skin;Expressed milk;Pre-pump if needed;Adjust position  Lactation Tools Discussed/Used WIC Program: Yes   Consult  Status Consult Status: Follow-up Date: 08/12/20 Follow-up type: In-patient    [name] 08/11/2020, 9:52 AM

## 2020-08-12 ENCOUNTER — Inpatient Hospital Stay (HOSPITAL_COMMUNITY)
Admission: AD | Admit: 2020-08-12 | Payer: Medicaid Other | Source: Home / Self Care | Admitting: Obstetrics and Gynecology

## 2020-08-12 ENCOUNTER — Inpatient Hospital Stay (HOSPITAL_COMMUNITY): Payer: Medicaid Other

## 2020-08-12 ENCOUNTER — Inpatient Hospital Stay (HOSPITAL_COMMUNITY): Admit: 2020-08-12 | Payer: Medicaid Other | Admitting: Obstetrics and Gynecology

## 2020-08-12 ENCOUNTER — Telehealth: Payer: Self-pay | Admitting: General Practice

## 2020-08-12 NOTE — Telephone Encounter (Signed)
Melissa Randleman called and left message on nurse voicemail line stating she wanted to give [place] an update on the patient. She states she spoke with the patient this morning after reviewing our messages from her recent nurse visit appt. Ski told her she wasn't aware of the voicemail messages we left but will go today to pick up her new prescription and start taking it. Patient reported to [facility] in the 871U and a headache. Melissa encouraged her to go to the hospital but the patient stated she just wanted to pick up her prescription to start taking. Melissa stated she would reach out to the [facility] nurse to see if she could perform a blood pressure check on the patient and would assess her incision as well. Melissa stated if we have questions we can call her back.

## 2020-08-17 ENCOUNTER — Ambulatory Visit: Payer: Self-pay

## 2020-08-17 NOTE — Lactation Note (Signed)
This note was copied from a baby's chart. Lactation Consultation Note  Patient Name: Joann Meyers DHWYS'H Date: 08/17/2020 Reason for consult: Follow-up assessment;NICU baby  LC to room for f/u visit. Mom complains of decreasing milk supply. She continues to pump 8+ times in 24 hours. Provided hands free pumping top and encouraged mom to massage breasts to increase yield. Mom and baby are struggling with bf. LC to return tomorrow at 1200 to assist.    Consult Status Consult Status: Follow-up Date: 08/18/20 Follow-up type: In-patient    [name] 08/17/2020, 5:39 PM

## 2020-08-18 ENCOUNTER — Ambulatory Visit: Payer: Self-pay

## 2020-08-18 ENCOUNTER — Ambulatory Visit (INDEPENDENT_AMBULATORY_CARE_PROVIDER_SITE_OTHER): Payer: Medicaid Other | Admitting: *Deleted

## 2020-08-18 ENCOUNTER — Telehealth: Payer: Self-pay | Admitting: *Deleted

## 2020-08-18 ENCOUNTER — Other Ambulatory Visit: Payer: Self-pay

## 2020-08-18 VITALS — BP 140/90 | HR 84 | Ht 62.0 in | Wt 199.8 lb

## 2020-08-18 DIAGNOSIS — B372 Candidiasis of skin and nail: Secondary | ICD-10-CM

## 2020-08-18 MED ORDER — NYSTATIN 100000 UNIT/GM EX POWD: 1.0000 "application " | Freq: Three times a day (TID) | CUTANEOUS | 0 refills | Status: DC

## 2020-08-18 NOTE — Telephone Encounter (Signed)
Tilley here today for nurse visit earlier ; had asked for refills bp and insulin medications. Per review has refills of her bp meds, and insulin . I called and left message she has refills available- just call pharmacy. If issues , call us back. Terris Bodin,RN

## 2020-08-18 NOTE — Lactation Note (Signed)
This note was copied from a baby's chart. Lactation Consultation Note  Patient Name: Joann Meyers OEHOZ'Y Date: 08/18/2020 Reason for consult: Follow-up assessment;NICU baby;1st time breastfeeding;Late-preterm 34-36.6wks  LC to room to assist with bf. Speech & Language also present. LC assisted with positioning mother and infant in laid back biological position. Baby latched easily and suckled with audible swallowing for 11 minutes. Infant was able to maintain 5-10 sucks between breaths but had increased work of breath between suckling sequences. After bf, infant was placed sleeping on mothers chest for remainder of gavage. LC reviewed positioning and encouraged mother to request bf assistance prn.   LATCH Score Latch: Grasps breast easily, tongue down, lips flanged, rhythmical sucking.  Audible Swallowing: Spontaneous and intermittent  Type of Nipple: Everted at rest and after stimulation  Comfort (Breast/Nipple): Soft / non-tender  Hold (Positioning): Assistance needed to correctly position infant at breast and maintain latch.  LATCH Score: 9  Interventions Interventions: Breast feeding basics reviewed;Support pillows;Assisted with latch;Position options;Skin to skin;Adjust position;DEBP   Consult Status Consult Status: Follow-up Date: 08/19/20 Follow-up type: In-patient    [name] 08/18/2020, 12:20 PM

## 2020-08-18 NOTE — Progress Notes (Signed)
Patient seen and assessed by nursing staff.  Agree with documentation and plan.  

## 2020-08-18 NOTE — Progress Notes (Signed)
Here for bp check and wound check. Had c/s 08/01/20 for [facility] with  superimposed preeclampsia. D/c home on 08/04/20.  C/o states has been having headaches since she went home from hospital .States =9 and takes iburprofen and her oxycodone - states it does not help her headache.  States has been taking bp medicine everyday.  States today headache today is 9. States bp at home 176/88. Today in office bp 140/90.  Incision intact but area around incision 2- 3inches reddened , incision covered with dried , yellowish scales. Incision with mal odor. Dr Kennon Rounds in to view incision. Recommended Nystatin powder TID due to yeast. Also reviewed patient history.  Continue bp meds as ordered.  Come as scheduled for postpartum appointment.  Cleansed wound with saline and 4x4. Instructed patient to keep clean and dry.   Patient reports food insecurity. Taken to food market and given 1 bag of food. Joann Sancho,RN

## 2020-08-22 ENCOUNTER — Ambulatory Visit: Payer: Self-pay

## 2020-08-22 NOTE — Lactation Note (Signed)
This note was copied from a baby's chart. Lactation Consultation Note  Patient Name: Joann Meyers Date: 08/22/2020   LC attempted to visit with Mom of baby being discharged today.  Mom not in room at present.  RN to call [facility] when Mom arrives.    Broadus John 08/22/2020, 11:33 AM

## 2020-09-01 ENCOUNTER — Ambulatory Visit: Payer: Medicaid Other | Admitting: Obstetrics and Gynecology

## 2020-09-06 ENCOUNTER — Telehealth: Payer: Self-pay | Admitting: *Deleted

## 2020-09-06 NOTE — Telephone Encounter (Signed)
Message received from [place]. She stated that she spoke w/pt on 11/19. Pt missed PP appt due to she does not have access to her MyChart account and was not aware of appt. She would like to reschedule. Pt has HTN and is also taking insulin. Please reschedule appt and notify pt via phone @ [phone].  Message forwarded to scheduling staff.

## 2020-09-15 ENCOUNTER — Telehealth: Payer: Self-pay | Admitting: Family Medicine

## 2020-09-15 NOTE — Telephone Encounter (Signed)
I called pt twice to reschedule PP appt that was missed. Pt answered phone and hung up, I called back and pt answered phone said hello and I introduced myself and pt hung up again. If pt calls back we can get her rescheduled for PP. thanks

## 2020-10-04 ENCOUNTER — Ambulatory Visit (INDEPENDENT_AMBULATORY_CARE_PROVIDER_SITE_OTHER): Payer: Medicaid Other | Admitting: Obstetrics and Gynecology

## 2020-10-04 ENCOUNTER — Encounter: Payer: Self-pay | Admitting: Obstetrics and Gynecology

## 2020-10-04 ENCOUNTER — Other Ambulatory Visit: Payer: Self-pay

## 2020-10-04 VITALS — BP 138/80 | HR 92 | Temp 98.5°F | Wt 207.0 lb

## 2020-10-04 DIAGNOSIS — E119 Type 2 diabetes mellitus without complications: Secondary | ICD-10-CM

## 2020-10-04 DIAGNOSIS — O1414 Severe pre-eclampsia complicating childbirth: Secondary | ICD-10-CM

## 2020-10-04 DIAGNOSIS — B372 Candidiasis of skin and nail: Secondary | ICD-10-CM

## 2020-10-04 DIAGNOSIS — O24319 Unspecified pre-existing diabetes mellitus in pregnancy, unspecified trimester: Secondary | ICD-10-CM

## 2020-10-04 DIAGNOSIS — O2493 Unspecified diabetes mellitus in the puerperium: Secondary | ICD-10-CM

## 2020-10-04 DIAGNOSIS — O9883 Other maternal infectious and parasitic diseases complicating the puerperium: Secondary | ICD-10-CM

## 2020-10-04 MED ORDER — INSULIN ASPART 100 UNIT/ML ~~LOC~~ SOLN: 8.0000 [IU] | Freq: Three times a day (TID) | SUBCUTANEOUS | 11 refills | Status: DC

## 2020-10-04 MED ORDER — BLOOD PRESSURE MONITORING DEVI: 1.0000 | 0 refills | Status: DC

## 2020-10-04 MED ORDER — NYSTATIN 100000 UNIT/GM EX POWD: 1.0000 "application " | Freq: Three times a day (TID) | CUTANEOUS | 0 refills | Status: DC

## 2020-10-04 MED ORDER — INSULIN DETEMIR 100 UNIT/ML ~~LOC~~ SOLN: 20.0000 [IU] | Freq: Two times a day (BID) | SUBCUTANEOUS | 11 refills | Status: DC

## 2020-10-04 NOTE — Progress Notes (Signed)
[place] Partum Visit Note  Joann Meyers is a 34 y.o. G30P0101 female who presents for a postpartum visit. She is 9 weeks postpartum following a primary cesarean section.  I have fully reviewed the prenatal and intrapartum course. The delivery was at 35 gestational weeks.  Anesthesia: spinal. Postpartum course has been complicated by migraines that were suspected to be PRES, HTN, T2DM, and unable to get medications for last week. Baby is doing well. Baby is feeding by bottle - Similac with Iron. Bleeding no bleeding. Bowel function is normal. Bladder function is normal. Patient is not sexually active. Contraception method is none. Postpartum depression screening: negative.   The pregnancy intention screening data noted above was reviewed. Potential methods of contraception were discussed. The patient elected to proceed with Abstinence.     The following portions of the patient's history were reviewed and updated as appropriate: allergies, current medications, past family history, past medical history, past social history, past surgical history and problem list.  Review of Systems Constitutional: negative Eyes: negative Ears, nose, mouth, throat, and face: negative Respiratory: negative Cardiovascular: negative Gastrointestinal: negative Genitourinary:negative Integument/breast: positive for skin near and around incision red and painful Hematologic/lymphatic: negative Musculoskeletal:negative Neurological: negative Behavioral/Psych: negative Endocrine: negative Allergic/Immunologic: negative    Objective:  BP 138/80   Pulse 92   Temp 98.5 F (36.9 C)   Wt 207 lb (93.9 kg)   LMP  (LMP Unknown)   BMI 37.86 kg/m    General:  alert, cooperative and no distress   Breasts:  inspection negative, no nipple discharge or bleeding, no masses or nodularity palpable  Lungs: clear to auscultation bilaterally  Heart:  regular rate and rhythm, S1, S2 normal, no murmur, click, rub or gallop   Abdomen: abnormal findings:  redness, small skin tears under pannus   Vulva:  not evaluated  Vagina: not evaluated  Cervix:  not evaluated  Corpus: not examined  Adnexa:  not evaluated  Rectal Exam: Not performed.        Assessment:  Preexisting diabetes complicating pregnancy, antepartum  - Rx for insulin aspart (NOVOLOG) 100 UNIT/ML injection,  - Rx for insulin detemir (LEVEMIR) 100 UNIT/ML injection  Yeast infection of the skin  - Rx for nystatin (MYCOSTATIN/NYSTOP) powder  Encounter for postpartum visit - Normal postpartum exam. Pap smear not done at today's visit.   Plan:   Essential components of care per ACOG recommendations:  1.  Mood and well being: Patient with negative depression screening today. Reviewed local resources for support.  - Patient does not use tobacco.  - hx of drug use? No  2. Infant care and feeding:  -Patient currently breastmilk feeding? No  -Social determinants of health (SDOH) reviewed in EPIC. No concerns  3. Sexuality, contraception and birth spacing - Patient does not want a pregnancy in the next year.  Desired family size is unsure of number of children.  - Reviewed forms of contraception in tiered fashion. Patient desired no method today.  She states that her spouse isn't living here right now anyway. - Discussed birth spacing of 18 months  4. Sleep and fatigue -Encouraged family/partner/community support of 4 hrs of uninterrupted sleep to help with mood and fatigue  5. Physical Recovery  - Discussed patients delivery and complications - Patient had a primary cesarean delivery, postoperative healing reviewed. Patient expressed understanding - Patient has urinary incontinence? No  - Patient is safe to resume physical and sexual activity  6.  Health Maintenance - Last pap smear  done 03/03/2020 and was normal with negative HPV.  7. Multiple Chronic Diseases - PCP follow up -- needs referral for PCP  Laury Deep, [place] for  [place]

## 2020-10-12 ENCOUNTER — Other Ambulatory Visit: Payer: Self-pay

## 2020-10-12 ENCOUNTER — Encounter (HOSPITAL_COMMUNITY): Payer: Self-pay | Admitting: *Deleted

## 2020-10-12 ENCOUNTER — Ambulatory Visit (HOSPITAL_COMMUNITY)
Admission: EM | Admit: 2020-10-12 | Discharge: 2020-10-12 | Disposition: A | Discharge status: Home / Self Care | Payer: Medicaid Other | Attending: Student | Admitting: Student

## 2020-10-12 DIAGNOSIS — G5603 Carpal tunnel syndrome, bilateral upper limbs: Secondary | ICD-10-CM | POA: Diagnosis not present
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
## 2020-10-12 NOTE — ED Triage Notes (Signed)
Pt reports bil hand pain since June 21. Pt reported hands felt worse today after holding baby. Pt reports pain feels like needles in her hands,

## 2020-10-12 NOTE — Discharge Instructions (Addendum)
Wear your wrist braces at night for at least 4 weeks. If symptoms do not improve with this, follow-up with sports medicine (information provided below).   Seek additional medical attention if you have chest pain, shortness of breath, weakness/sensation changes in your arms/legs, vision changes, new/worsening fevers/chills, confusion, worsening of symptoms despite the above treatment plan, etc.

## 2020-10-22 ENCOUNTER — Other Ambulatory Visit: Payer: Self-pay | Admitting: Obstetrics & Gynecology

## 2020-10-26 ENCOUNTER — Other Ambulatory Visit: Payer: Self-pay | Admitting: Obstetrics & Gynecology

## 2020-10-29 ENCOUNTER — Encounter (HOSPITAL_COMMUNITY): Payer: Self-pay | Admitting: *Deleted

## 2020-10-29 ENCOUNTER — Other Ambulatory Visit: Payer: Self-pay

## 2020-10-29 ENCOUNTER — Ambulatory Visit (HOSPITAL_COMMUNITY)
Admission: EM | Admit: 2020-10-29 | Discharge: 2020-10-29 | Disposition: A | Discharge status: Home / Self Care | Payer: Medicaid Other | Attending: Emergency Medicine | Admitting: Emergency Medicine

## 2020-10-29 DIAGNOSIS — J069 Acute upper respiratory infection, unspecified: Secondary | ICD-10-CM | POA: Insufficient documentation

## 2020-10-29 DIAGNOSIS — R059 Cough, unspecified: Secondary | ICD-10-CM | POA: Insufficient documentation

## 2020-10-29 DIAGNOSIS — H66001 Acute suppurative otitis media without spontaneous rupture of ear drum, right ear: Secondary | ICD-10-CM | POA: Insufficient documentation

## 2020-10-29 DIAGNOSIS — R519 Headache, unspecified: Secondary | ICD-10-CM | POA: Insufficient documentation

## 2020-10-29 DIAGNOSIS — Z20822 Contact with and (suspected) exposure to covid-19: Secondary | ICD-10-CM | POA: Diagnosis not present

## 2020-10-29 MED ORDER — AMOXICILLIN-POT CLAVULANATE 875-125 MG PO TABS: 1.0000 | ORAL_TABLET | Freq: Two times a day (BID) | ORAL | 0 refills | Status: DC

## 2020-10-29 MED ORDER — BENZONATATE 100 MG PO CAPS: 100.0000 mg | ORAL_CAPSULE | Freq: Three times a day (TID) | ORAL | 0 refills | Status: DC | PRN
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
## 2020-10-29 NOTE — ED Triage Notes (Signed)
Pt reports HA and dry cough

## 2020-10-29 NOTE — Discharge Instructions (Addendum)
Self isolate until covid results are back.  We will notify you by phone if it is positive. Your negative results will be sent through your MyChart.    If it is positive you need to isolate from others for a total of 5 days. If no fever for 24 hours without medications, and symptoms improving you may end isolation on day 6, but wear a mask if around any others for an additional 5 days.  You do have an ear infection. Complete course of antibiotics.  Tessalon as needed for cough.  Push fluids to ensure adequate hydration and keep secretions thin.  If symptoms worsen or do not improve in the next week to return to be seen or to follow up with your PCP.

## 2020-10-30 LAB — SARS CORONAVIRUS 2 (TAT 6-24 HRS): SARS Coronavirus 2: NEGATIVE

## 2020-11-21 ENCOUNTER — Ambulatory Visit (HOSPITAL_COMMUNITY)
Admission: EM | Admit: 2020-11-21 | Discharge: 2020-11-21 | Disposition: A | Discharge status: Home / Self Care | Payer: Medicaid Other | Attending: Emergency Medicine | Admitting: Emergency Medicine

## 2020-11-21 ENCOUNTER — Other Ambulatory Visit: Payer: Self-pay

## 2020-11-21 ENCOUNTER — Encounter (HOSPITAL_COMMUNITY): Payer: Self-pay

## 2020-11-21 DIAGNOSIS — Z01812 Encounter for preprocedural laboratory examination: Secondary | ICD-10-CM | POA: Diagnosis not present

## 2020-11-21 DIAGNOSIS — B349 Viral infection, unspecified: Secondary | ICD-10-CM | POA: Insufficient documentation

## 2020-11-21 DIAGNOSIS — Z20822 Contact with and (suspected) exposure to covid-19: Secondary | ICD-10-CM | POA: Insufficient documentation

## 2020-11-21 LAB — SARS CORONAVIRUS 2 (TAT 6-24 HRS): SARS Coronavirus 2: NEGATIVE

## 2020-11-21 MED ORDER — IBUPROFEN 600 MG PO TABS: 600.0000 mg | ORAL_TABLET | Freq: Four times a day (QID) | ORAL | 0 refills | Status: DC | PRN

## 2020-11-21 MED ORDER — FLUTICASONE PROPIONATE 50 MCG/ACT NA SUSP: 2.0000 | Freq: Every day | NASAL | 0 refills | Status: DC

## 2020-11-21 MED ORDER — ONDANSETRON 8 MG PO TBDP: ORAL_TABLET | ORAL | 0 refills | Status: DC

## 2020-11-21 NOTE — ED Triage Notes (Signed)
Pt in with c/o headache, vomiting, and chills that started on Thursday. States her relative had covid  Also c/o diarrhea and runny nose  Pt has taken tylenol and ibuprofen for sxs
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
## 2020-11-21 NOTE — Discharge Instructions (Addendum)
Push electrolyte containing fluids such as Zofran.  Take 600 mg ibuprofen combined with 1000 mg of Tylenol together 3-4 times a day.  Saline nasal irrigation with a Milta Deiters Med rinse and distilled water as often as you want, Flonase for the nasal congestion.  We will contact you if your Covid comes back positive.  In the meantime, I would quarantine for another several days and wear well fitted masks around people until 10 days after the exposure  Below is a list of primary care practices who are taking new patients for you to follow-up with.  [facility] clinic Ground Floor - [facility], [place], [place], [place] [address] [phone]  [facility] Care at [facility] [address] [place], [place] [address] [phone]  [facility] [place], [place] [address] [phone]  [facility] [phone] [place] [address]  [facility]: [place]  [phone]  [place] [facility]: [place]   [phone]  [facility]: [address] [place]  [phone]  [place] primary care : [address] [place] [phone]  [facility] Primary Care: [address] [place] [address] [phone]  [facility]: [place] [phone]  Dr. Blanchie Serve [place]  [phone]  Dr. Benito Mccreedy, [facility]. [place], [place] [address]  [phone]  Go to www.goodrx.com to look up your medications. This will give you a list of where you can find your  prescriptions at the most affordable prices. Or ask the pharmacist what the cash price is, or if they have any other discount programs available to help make your medication more affordable. This can be less expensive than what you would pay with insurance.

## 2020-12-04 ENCOUNTER — Other Ambulatory Visit: Payer: Self-pay

## 2020-12-04 ENCOUNTER — Encounter (HOSPITAL_COMMUNITY): Payer: Self-pay | Admitting: *Deleted

## 2020-12-04 ENCOUNTER — Ambulatory Visit (HOSPITAL_COMMUNITY)
Admission: EM | Admit: 2020-12-04 | Discharge: 2020-12-04 | Disposition: A | Discharge status: Home / Self Care | Payer: Medicaid Other | Attending: Internal Medicine | Admitting: Internal Medicine

## 2020-12-04 DIAGNOSIS — J029 Acute pharyngitis, unspecified: Secondary | ICD-10-CM | POA: Insufficient documentation

## 2020-12-04 DIAGNOSIS — Z833 Family history of diabetes mellitus: Secondary | ICD-10-CM | POA: Diagnosis not present

## 2020-12-04 DIAGNOSIS — R1110 Vomiting, unspecified: Secondary | ICD-10-CM | POA: Diagnosis not present

## 2020-12-04 DIAGNOSIS — R519 Headache, unspecified: Secondary | ICD-10-CM | POA: Diagnosis not present

## 2020-12-04 DIAGNOSIS — Z20822 Contact with and (suspected) exposure to covid-19: Secondary | ICD-10-CM | POA: Insufficient documentation

## 2020-12-04 DIAGNOSIS — Z7982 Long term (current) use of aspirin: Secondary | ICD-10-CM | POA: Insufficient documentation

## 2020-12-04 DIAGNOSIS — R509 Fever, unspecified: Secondary | ICD-10-CM | POA: Diagnosis not present

## 2020-12-04 DIAGNOSIS — R112 Nausea with vomiting, unspecified: Secondary | ICD-10-CM | POA: Diagnosis not present

## 2020-12-04 DIAGNOSIS — E1165 Type 2 diabetes mellitus with hyperglycemia: Secondary | ICD-10-CM | POA: Insufficient documentation

## 2020-12-04 DIAGNOSIS — R739 Hyperglycemia, unspecified: Secondary | ICD-10-CM | POA: Diagnosis not present

## 2020-12-04 DIAGNOSIS — Z885 Allergy status to narcotic agent status: Secondary | ICD-10-CM | POA: Diagnosis not present

## 2020-12-04 DIAGNOSIS — R5381 Other malaise: Secondary | ICD-10-CM

## 2020-12-04 DIAGNOSIS — R059 Cough, unspecified: Secondary | ICD-10-CM

## 2020-12-04 DIAGNOSIS — Z794 Long term (current) use of insulin: Secondary | ICD-10-CM | POA: Diagnosis not present

## 2020-12-04 LAB — POCT URINALYSIS DIPSTICK, ED / UC
Bilirubin Urine: NEGATIVE
Glucose, UA: 100 mg/dL — AB
Ketones, ur: NEGATIVE mg/dL
Leukocytes,Ua: NEGATIVE
Nitrite: NEGATIVE
Protein, ur: >=300 mg/dL — AB
Specific Gravity, Urine: >=1.03 (ref 1.005–1.030)
Urobilinogen, UA: 0.2 mg/dL (ref 0.0–1.0)
pH: 5.5 (ref 5.0–8.0)

## 2020-12-04 LAB — SARS CORONAVIRUS 2 (TAT 6-24 HRS): SARS Coronavirus 2: NEGATIVE

## 2020-12-04 LAB — CBG MONITORING, ED: Glucose-Capillary: 324 mg/dL — ABNORMAL HIGH (ref 70–99)

## 2020-12-04 NOTE — ED Notes (Signed)
CBG 324, Pt did not take insuline this morning

## 2020-12-04 NOTE — Discharge Instructions (Addendum)
Take diabetes medications as prescribed.  Check blood sugar.  If you are experiencing abdominal pain, unable to keep fluids down go to the Emergency department for evaluation.  COVID test pending.
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
## 2020-12-04 NOTE — ED Triage Notes (Signed)
PT reports since Wed she has had fever,sore throat, vomiting .

## 2021-01-14 ENCOUNTER — Ambulatory Visit: Payer: Medicaid Other | Attending: Nurse Practitioner | Admitting: Nurse Practitioner

## 2021-01-14 ENCOUNTER — Other Ambulatory Visit: Payer: Self-pay

## 2021-01-14 ENCOUNTER — Encounter: Payer: Self-pay | Admitting: Nurse Practitioner

## 2021-01-14 VITALS — BP 122/85 | HR 77 | Resp 18 | Ht 62.0 in | Wt 201.6 lb

## 2021-01-14 DIAGNOSIS — G43909 Migraine, unspecified, not intractable, without status migrainosus: Secondary | ICD-10-CM | POA: Diagnosis not present

## 2021-01-14 DIAGNOSIS — E119 Type 2 diabetes mellitus without complications: Secondary | ICD-10-CM

## 2021-01-14 DIAGNOSIS — I10 Essential (primary) hypertension: Secondary | ICD-10-CM | POA: Diagnosis not present

## 2021-01-14 DIAGNOSIS — R0981 Nasal congestion: Secondary | ICD-10-CM

## 2021-01-14 DIAGNOSIS — D649 Anemia, unspecified: Secondary | ICD-10-CM

## 2021-01-14 DIAGNOSIS — K5909 Other constipation: Secondary | ICD-10-CM

## 2021-01-14 DIAGNOSIS — Z8673 Personal history of transient ischemic attack (TIA), and cerebral infarction without residual deficits: Secondary | ICD-10-CM

## 2021-01-14 LAB — POCT GLYCOSYLATED HEMOGLOBIN (HGB A1C): HbA1c, POC (controlled diabetic range): 7.9 % — AB (ref 0.0–7.0)

## 2021-01-14 LAB — GLUCOSE, POCT (MANUAL RESULT ENTRY): POC Glucose: 267 mg/dl — AB (ref 70–99)

## 2021-01-14 MED ORDER — SENNOSIDES-DOCUSATE SODIUM 8.6-50 MG PO TABS: 2.0000 | ORAL_TABLET | ORAL | 2 refills | Status: DC

## 2021-01-14 MED ORDER — GLUCOSE BLOOD VI STRP: ORAL_STRIP | 12 refills | Status: DC

## 2021-01-14 MED ORDER — METFORMIN HCL 1000 MG PO TABS: 1000.0000 mg | ORAL_TABLET | Freq: Two times a day (BID) | ORAL | 3 refills | Status: DC

## 2021-01-14 MED ORDER — IBUPROFEN 600 MG PO TABS: 600.0000 mg | ORAL_TABLET | Freq: Four times a day (QID) | ORAL | 0 refills | Status: DC | PRN

## 2021-01-14 MED ORDER — ASPIRIN EC 81 MG PO TBEC: 81.0000 mg | DELAYED_RELEASE_TABLET | Freq: Every day | ORAL | 3 refills | Status: AC

## 2021-01-14 MED ORDER — GLIPIZIDE 10 MG PO TABS: 10.0000 mg | ORAL_TABLET | Freq: Two times a day (BID) | ORAL | 3 refills | Status: DC

## 2021-01-14 MED ORDER — FERROUS SULFATE 325 (65 FE) MG PO TABS: 325.0000 mg | ORAL_TABLET | Freq: Every day | ORAL | 3 refills | Status: DC

## 2021-01-14 MED ORDER — FLUTICASONE PROPIONATE 50 MCG/ACT NA SUSP: 2.0000 | Freq: Every day | NASAL | 0 refills | Status: DC

## 2021-01-14 MED ORDER — NIFEDIPINE ER OSMOTIC RELEASE 90 MG PO TB24: 90.0000 mg | ORAL_TABLET | Freq: Every day | ORAL | 1 refills | Status: DC

## 2021-01-14 MED ORDER — ACCU-CHEK SOFTCLIX LANCETS MISC: 100.0000 | Freq: Four times a day (QID) | 12 refills | Status: DC
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
## 2021-01-14 NOTE — Progress Notes (Signed)
Pt also needs refills on all medications. Pt states she has not taken any medication in about 3 weeks. Her BS was 267 and A1C 7.9.

## 2021-01-15 LAB — CBC
Hematocrit: 31.4 % — ABNORMAL LOW (ref 34.0–46.6)
Hemoglobin: 10.4 g/dL — ABNORMAL LOW (ref 11.1–15.9)
MCH: 26.3 pg — ABNORMAL LOW (ref 26.6–33.0)
MCHC: 33.1 g/dL (ref 31.5–35.7)
MCV: 79 fL (ref 79–97)
Platelets: 164 10*3/uL (ref 150–450)
RBC: 3.96 x10E6/uL (ref 3.77–5.28)
RDW: 16 % — ABNORMAL HIGH (ref 11.7–15.4)
WBC: 4.3 10*3/uL (ref 3.4–10.8)

## 2021-01-15 LAB — CMP14+EGFR
ALT: 14 IU/L (ref 0–32)
AST: 16 IU/L (ref 0–40)
Albumin/Globulin Ratio: 2.1 (ref 1.2–2.2)
Albumin: 4.4 g/dL (ref 3.8–4.8)
Alkaline Phosphatase: 86 IU/L (ref 44–121)
BUN/Creatinine Ratio: 21 (ref 9–23)
BUN: 11 mg/dL (ref 6–20)
Bilirubin Total: 0.8 mg/dL (ref 0.0–1.2)
CO2: 20 mmol/L (ref 20–29)
Calcium: 9.4 mg/dL (ref 8.7–10.2)
Chloride: 103 mmol/L (ref 96–106)
Creatinine, Ser: 0.53 mg/dL — ABNORMAL LOW (ref 0.57–1.00)
Globulin, Total: 2.1 g/dL (ref 1.5–4.5)
Glucose: 263 mg/dL — ABNORMAL HIGH (ref 65–99)
Potassium: 4.5 mmol/L (ref 3.5–5.2)
Sodium: 139 mmol/L (ref 134–144)
Total Protein: 6.5 g/dL (ref 6.0–8.5)
eGFR: 124 mL/min/{1.73_m2} (ref >59)

## 2021-01-15 LAB — MICROALBUMIN / CREATININE URINE RATIO
Creatinine, Urine: 122.9 mg/dL
Microalb/Creat Ratio: 258 mg/g creat — ABNORMAL HIGH (ref 0–29)
Microalbumin, Urine: 316.8 ug/mL

## 2021-01-16 ENCOUNTER — Other Ambulatory Visit: Payer: Self-pay | Admitting: Nurse Practitioner

## 2021-01-16 MED ORDER — LISINOPRIL 2.5 MG PO TABS: 2.5000 mg | ORAL_TABLET | Freq: Every day | ORAL | 3 refills | Status: DC

## 2021-01-16 NOTE — Progress Notes (Signed)
BP Readings from Last 3 Encounters:  01/14/21 122/85  12/04/20 (!) 138/94  11/21/20 136/82

## 2021-03-27 ENCOUNTER — Emergency Department (HOSPITAL_COMMUNITY): Payer: Medicaid Other

## 2021-03-27 ENCOUNTER — Encounter (HOSPITAL_COMMUNITY): Payer: Self-pay | Admitting: Pharmacy Technician

## 2021-03-27 ENCOUNTER — Emergency Department (HOSPITAL_COMMUNITY)
Admission: EM | Admit: 2021-03-27 | Discharge: 2021-03-27 | Disposition: A | Discharge status: Home / Self Care | Payer: Medicaid Other | Attending: Emergency Medicine | Admitting: Emergency Medicine

## 2021-03-27 ENCOUNTER — Other Ambulatory Visit: Payer: Self-pay

## 2021-03-27 DIAGNOSIS — R519 Headache, unspecified: Secondary | ICD-10-CM | POA: Insufficient documentation

## 2021-03-27 DIAGNOSIS — Z91040 Latex allergy status: Secondary | ICD-10-CM | POA: Diagnosis not present

## 2021-03-27 DIAGNOSIS — R079 Chest pain, unspecified: Secondary | ICD-10-CM | POA: Diagnosis present

## 2021-03-27 DIAGNOSIS — R072 Precordial pain: Secondary | ICD-10-CM | POA: Insufficient documentation

## 2021-03-27 DIAGNOSIS — Z7984 Long term (current) use of oral hypoglycemic drugs: Secondary | ICD-10-CM | POA: Insufficient documentation

## 2021-03-27 DIAGNOSIS — R0789 Other chest pain: Secondary | ICD-10-CM

## 2021-03-27 DIAGNOSIS — Z7982 Long term (current) use of aspirin: Secondary | ICD-10-CM | POA: Diagnosis not present

## 2021-03-27 DIAGNOSIS — Z794 Long term (current) use of insulin: Secondary | ICD-10-CM | POA: Diagnosis not present

## 2021-03-27 DIAGNOSIS — R42 Dizziness and giddiness: Secondary | ICD-10-CM | POA: Diagnosis not present

## 2021-03-27 DIAGNOSIS — R112 Nausea with vomiting, unspecified: Secondary | ICD-10-CM | POA: Insufficient documentation

## 2021-03-27 DIAGNOSIS — E1165 Type 2 diabetes mellitus with hyperglycemia: Secondary | ICD-10-CM | POA: Diagnosis not present

## 2021-03-27 DIAGNOSIS — Z79899 Other long term (current) drug therapy: Secondary | ICD-10-CM | POA: Insufficient documentation

## 2021-03-27 DIAGNOSIS — I2510 Atherosclerotic heart disease of native coronary artery without angina pectoris: Secondary | ICD-10-CM | POA: Insufficient documentation

## 2021-03-27 DIAGNOSIS — I10 Essential (primary) hypertension: Secondary | ICD-10-CM | POA: Diagnosis not present

## 2021-03-27 DIAGNOSIS — Z20822 Contact with and (suspected) exposure to covid-19: Secondary | ICD-10-CM | POA: Insufficient documentation

## 2021-03-27 LAB — COMPREHENSIVE METABOLIC PANEL
ALT: 16 U/L (ref 0–44)
AST: 17 U/L (ref 15–41)
Albumin: 3.5 g/dL (ref 3.5–5.0)
Alkaline Phosphatase: 62 U/L (ref 38–126)
Anion gap: 9 (ref 5–15)
BUN: 24 mg/dL — ABNORMAL HIGH (ref 6–20)
CO2: 22 mmol/L (ref 22–32)
Calcium: 8.9 mg/dL (ref 8.9–10.3)
Chloride: 103 mmol/L (ref 98–111)
Creatinine, Ser: 0.68 mg/dL (ref 0.44–1.00)
GFR, Estimated: >60 mL/min (ref >60)
Glucose, Bld: 365 mg/dL — ABNORMAL HIGH (ref 70–99)
Potassium: 4.1 mmol/L (ref 3.5–5.1)
Sodium: 134 mmol/L — ABNORMAL LOW (ref 135–145)
Total Bilirubin: 1 mg/dL (ref 0.3–1.2)
Total Protein: 6.1 g/dL — ABNORMAL LOW (ref 6.5–8.1)

## 2021-03-27 LAB — CBC
HCT: 31.8 % — ABNORMAL LOW (ref 36.0–46.0)
Hemoglobin: 10.5 g/dL — ABNORMAL LOW (ref 12.0–15.0)
MCH: 26.4 pg (ref 26.0–34.0)
MCHC: 33 g/dL (ref 30.0–36.0)
MCV: 80.1 fL (ref 80.0–100.0)
Platelets: 160 10*3/uL (ref 150–400)
RBC: 3.97 MIL/uL (ref 3.87–5.11)
RDW: 15.9 % — ABNORMAL HIGH (ref 11.5–15.5)
WBC: 6 10*3/uL (ref 4.0–10.5)
nRBC: 0 % (ref 0.0–0.2)

## 2021-03-27 LAB — URINALYSIS, ROUTINE W REFLEX MICROSCOPIC
Bilirubin Urine: NEGATIVE
Glucose, UA: >=500 mg/dL — AB
Hgb urine dipstick: NEGATIVE
Ketones, ur: NEGATIVE mg/dL
Nitrite: NEGATIVE
Protein, ur: 30 mg/dL — AB
Specific Gravity, Urine: 1.02 (ref 1.005–1.030)
pH: 6 (ref 5.0–8.0)

## 2021-03-27 LAB — TROPONIN I (HIGH SENSITIVITY)
Troponin I (High Sensitivity): 4 ng/L (ref <18)
Troponin I (High Sensitivity): 4 ng/L (ref <18)

## 2021-03-27 LAB — LIPASE, BLOOD: Lipase: 29 U/L (ref 11–51)

## 2021-03-27 LAB — I-STAT BETA HCG BLOOD, ED (MC, WL, AP ONLY): I-stat hCG, quantitative: <5 m[IU]/mL (ref <5)

## 2021-03-27 LAB — SARS CORONAVIRUS 2 (TAT 6-24 HRS): SARS Coronavirus 2: NEGATIVE

## 2021-03-27 MED ORDER — METOCLOPRAMIDE HCL 5 MG/ML IJ SOLN
10.0000 mg | Freq: Once | INTRAMUSCULAR | Status: AC
  Administered 2021-03-27: 10 mg via INTRAVENOUS
  Filled 2021-03-27: qty 2

## 2021-03-27 MED ORDER — ACETAMINOPHEN 500 MG PO TABS
1000.0000 mg | ORAL_TABLET | Freq: Once | ORAL | Status: AC
  Administered 2021-03-27: 1000 mg via ORAL
  Filled 2021-03-27: qty 2

## 2021-03-27 MED ORDER — SODIUM CHLORIDE 0.9 % IV BOLUS
500.0000 mL | Freq: Once | INTRAVENOUS | Status: AC
  Administered 2021-03-27: 500 mL via INTRAVENOUS

## 2021-03-27 MED ORDER — DIPHENHYDRAMINE HCL 50 MG/ML IJ SOLN
12.5000 mg | Freq: Once | INTRAMUSCULAR | Status: AC
  Administered 2021-03-27: 12.5 mg via INTRAVENOUS
  Filled 2021-03-27: qty 1

## 2021-03-27 MED ORDER — DEXAMETHASONE SODIUM PHOSPHATE 10 MG/ML IJ SOLN
10.0000 mg | Freq: Once | INTRAMUSCULAR | Status: AC
  Administered 2021-03-27: 10 mg via INTRAVENOUS
  Filled 2021-03-27: qty 1

## 2021-03-27 NOTE — Discharge Instructions (Addendum)
Follow up with Neurology  Return for new or worsening symptoms

## 2021-03-27 NOTE — ED Notes (Signed)
ED Provider at bedside. 
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
## 2021-03-27 NOTE — ED Triage Notes (Signed)
Pt here via pov with reports of chest pai, emesis and headache onset this morning. Pt states her daughter has been sick with a bad cough and possibly the flu.
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
## 2021-08-05 ENCOUNTER — Ambulatory Visit (HOSPITAL_COMMUNITY)
Admission: EM | Admit: 2021-08-05 | Discharge: 2021-08-05 | Disposition: A | Discharge status: Home / Self Care | Payer: Medicaid Other | Attending: Student | Admitting: Student

## 2021-08-05 ENCOUNTER — Encounter (HOSPITAL_COMMUNITY): Payer: Self-pay | Admitting: Emergency Medicine

## 2021-08-05 ENCOUNTER — Other Ambulatory Visit: Payer: Self-pay

## 2021-08-05 DIAGNOSIS — L309 Dermatitis, unspecified: Secondary | ICD-10-CM | POA: Diagnosis not present

## 2021-08-05 DIAGNOSIS — Z3201 Encounter for pregnancy test, result positive: Secondary | ICD-10-CM | POA: Diagnosis not present

## 2021-08-05 DIAGNOSIS — R0789 Other chest pain: Secondary | ICD-10-CM

## 2021-08-05 LAB — POC URINE PREG, ED: Preg Test, Ur: POSITIVE — AB

## 2021-08-05 MED ORDER — HYDROCORTISONE 1 % EX CREA: TOPICAL_CREAM | CUTANEOUS | 0 refills | Status: AC
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
## 2021-08-05 NOTE — ED Notes (Signed)
Pt left without discharge paperwork. Attempted to call pt to inform pt of medications being sent to the pharmacy. Unable to reach pt at this time.
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
## 2021-08-05 NOTE — Discharge Instructions (Addendum)
-  Establish care with an OB/GYN, information below -Severe abdominal pain or chest pain, head to the emergency department

## 2021-08-07 ENCOUNTER — Inpatient Hospital Stay (HOSPITAL_COMMUNITY)
Admission: AD | Admit: 2021-08-07 | Discharge: 2021-08-07 | Disposition: A | Discharge status: Home / Self Care | Payer: Medicaid Other | Attending: Obstetrics & Gynecology | Admitting: Obstetrics & Gynecology

## 2021-08-07 ENCOUNTER — Inpatient Hospital Stay (HOSPITAL_COMMUNITY): Payer: Medicaid Other

## 2021-08-07 ENCOUNTER — Other Ambulatory Visit: Payer: Self-pay

## 2021-08-07 ENCOUNTER — Encounter (HOSPITAL_COMMUNITY): Payer: Self-pay | Admitting: Obstetrics & Gynecology

## 2021-08-07 DIAGNOSIS — D649 Anemia, unspecified: Secondary | ICD-10-CM | POA: Diagnosis not present

## 2021-08-07 DIAGNOSIS — O26891 Other specified pregnancy related conditions, first trimester: Secondary | ICD-10-CM

## 2021-08-07 DIAGNOSIS — Z3491 Encounter for supervision of normal pregnancy, unspecified, first trimester: Secondary | ICD-10-CM

## 2021-08-07 DIAGNOSIS — O24111 Pre-existing diabetes mellitus, type 2, in pregnancy, first trimester: Secondary | ICD-10-CM | POA: Diagnosis not present

## 2021-08-07 DIAGNOSIS — O10912 Unspecified pre-existing hypertension complicating pregnancy, second trimester: Secondary | ICD-10-CM | POA: Insufficient documentation

## 2021-08-07 DIAGNOSIS — R109 Unspecified abdominal pain: Secondary | ICD-10-CM | POA: Diagnosis not present

## 2021-08-07 DIAGNOSIS — O24311 Unspecified pre-existing diabetes mellitus in pregnancy, first trimester: Secondary | ICD-10-CM

## 2021-08-07 DIAGNOSIS — Z7984 Long term (current) use of oral hypoglycemic drugs: Secondary | ICD-10-CM | POA: Insufficient documentation

## 2021-08-07 DIAGNOSIS — E119 Type 2 diabetes mellitus without complications: Secondary | ICD-10-CM | POA: Diagnosis not present

## 2021-08-07 DIAGNOSIS — Z3A01 Less than 8 weeks gestation of pregnancy: Secondary | ICD-10-CM | POA: Insufficient documentation

## 2021-08-07 DIAGNOSIS — Z7901 Long term (current) use of anticoagulants: Secondary | ICD-10-CM | POA: Diagnosis not present

## 2021-08-07 DIAGNOSIS — Z79899 Other long term (current) drug therapy: Secondary | ICD-10-CM | POA: Insufficient documentation

## 2021-08-07 DIAGNOSIS — O3680X0 Pregnancy with inconclusive fetal viability, not applicable or unspecified: Secondary | ICD-10-CM | POA: Diagnosis not present

## 2021-08-07 DIAGNOSIS — O99011 Anemia complicating pregnancy, first trimester: Secondary | ICD-10-CM | POA: Diagnosis not present

## 2021-08-07 DIAGNOSIS — Z8673 Personal history of transient ischemic attack (TIA), and cerebral infarction without residual deficits: Secondary | ICD-10-CM | POA: Insufficient documentation

## 2021-08-07 DIAGNOSIS — O24112 Pre-existing diabetes mellitus, type 2, in pregnancy, second trimester: Secondary | ICD-10-CM | POA: Insufficient documentation

## 2021-08-07 LAB — URINALYSIS, ROUTINE W REFLEX MICROSCOPIC
Bacteria, UA: NONE SEEN
Bilirubin Urine: NEGATIVE
Glucose, UA: >=500 mg/dL — AB
Hgb urine dipstick: NEGATIVE
Ketones, ur: NEGATIVE mg/dL
Nitrite: NEGATIVE
Protein, ur: NEGATIVE mg/dL
Specific Gravity, Urine: 1.017 (ref 1.005–1.030)
pH: 6 (ref 5.0–8.0)

## 2021-08-07 LAB — COMPREHENSIVE METABOLIC PANEL
ALT: 14 U/L (ref 0–44)
AST: 16 U/L (ref 15–41)
Albumin: 3.5 g/dL (ref 3.5–5.0)
Alkaline Phosphatase: 61 U/L (ref 38–126)
Anion gap: 6 (ref 5–15)
BUN: 10 mg/dL (ref 6–20)
CO2: 24 mmol/L (ref 22–32)
Calcium: 9 mg/dL (ref 8.9–10.3)
Chloride: 106 mmol/L (ref 98–111)
Creatinine, Ser: 0.63 mg/dL (ref 0.44–1.00)
GFR, Estimated: >60 mL/min (ref >60)
Glucose, Bld: 338 mg/dL — ABNORMAL HIGH (ref 70–99)
Potassium: 4.4 mmol/L (ref 3.5–5.1)
Sodium: 136 mmol/L (ref 135–145)
Total Bilirubin: 0.6 mg/dL (ref 0.3–1.2)
Total Protein: 6 g/dL — ABNORMAL LOW (ref 6.5–8.1)

## 2021-08-07 LAB — CBC
HCT: 25.2 % — ABNORMAL LOW (ref 36.0–46.0)
Hemoglobin: 8.1 g/dL — ABNORMAL LOW (ref 12.0–15.0)
MCH: 26.1 pg (ref 26.0–34.0)
MCHC: 32.1 g/dL (ref 30.0–36.0)
MCV: 81.3 fL (ref 80.0–100.0)
Platelets: 168 10*3/uL (ref 150–400)
RBC: 3.1 MIL/uL — ABNORMAL LOW (ref 3.87–5.11)
RDW: 16.3 % — ABNORMAL HIGH (ref 11.5–15.5)
WBC: 4.4 10*3/uL (ref 4.0–10.5)
nRBC: 0 % (ref 0.0–0.2)

## 2021-08-07 LAB — HCG, QUANTITATIVE, PREGNANCY: hCG, Beta Chain, Quant, S: 9299 m[IU]/mL — ABNORMAL HIGH (ref <5)

## 2021-08-07 MED ORDER — METFORMIN HCL 1000 MG PO TABS: 1000.0000 mg | ORAL_TABLET | Freq: Two times a day (BID) | ORAL | 0 refills | Status: DC

## 2021-08-07 MED ORDER — FERROUS SULFATE 325 (65 FE) MG PO TABS: 325.0000 mg | ORAL_TABLET | ORAL | 0 refills | Status: DC
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
## 2021-08-07 NOTE — MAU Note (Signed)
Joann Meyers is a 35 y.o. here in MAU reporting: is here to confirm pregnancy, states she had some positive and negative tests at home. Did have some abdominal pain this morning but states no pain at this time. No bleeding.  LMP: 06/30/2021  Onset of complaint: ongoing  Pain score: 0/10  Vitals:   08/07/21 1524  BP: 128/76  Pulse: 77  Resp: 16  Temp: 98.7 F (37.1 C)  SpO2: 100%     Lab orders placed from triage: none

## 2021-08-07 NOTE — Discharge Instructions (Signed)
Return to care  If you have heavier bleeding that soaks through more than 2 pads per hour for an hour or more If you bleed so much that you feel like you might pass out or you do pass out If you have significant abdominal pain that is not improved with Tylenol   

## 2021-08-09 ENCOUNTER — Telehealth: Payer: Self-pay | Admitting: Radiology

## 2021-08-09 NOTE — Telephone Encounter (Signed)
Called patient to inform her off ultrasound appointment and New OB scheduled at [facility]. Phone number is not in service. Sent [employer].

## 2021-08-15 ENCOUNTER — Telehealth: Payer: Self-pay | Admitting: Medical

## 2021-08-15 ENCOUNTER — Other Ambulatory Visit: Payer: Self-pay

## 2021-08-15 ENCOUNTER — Ambulatory Visit
Admission: RE | Admit: 2021-08-15 | Discharge: 2021-08-15 | Disposition: A | Discharge status: Home / Self Care | Payer: Medicaid Other | Source: Ambulatory Visit | Attending: Student | Admitting: Student

## 2021-08-15 DIAGNOSIS — Z3A01 Less than 8 weeks gestation of pregnancy: Secondary | ICD-10-CM | POA: Diagnosis present

## 2021-08-15 DIAGNOSIS — O3680X0 Pregnancy with inconclusive fetal viability, not applicable or unspecified: Secondary | ICD-10-CM | POA: Diagnosis present

## 2021-08-15 DIAGNOSIS — R109 Unspecified abdominal pain: Secondary | ICD-10-CM | POA: Diagnosis present

## 2021-08-15 DIAGNOSIS — O24311 Unspecified pre-existing diabetes mellitus in pregnancy, first trimester: Secondary | ICD-10-CM | POA: Diagnosis present

## 2021-08-15 DIAGNOSIS — O26891 Other specified pregnancy related conditions, first trimester: Secondary | ICD-10-CM | POA: Insufficient documentation

## 2021-08-15 DIAGNOSIS — Z3491 Encounter for supervision of normal pregnancy, unspecified, first trimester: Secondary | ICD-10-CM | POA: Diagnosis present

## 2021-08-15 NOTE — Telephone Encounter (Signed)
I called Joann Meyers today at 3:43 PM and confirmed patient's identity using two patient identifiers. [place] results from earlier today were reviewed. Patient is scheduled for new OB visit at [facility] on 09/15/21. First trimester warning signs reviewed. Patient voiced understanding and had no further questions.   US OB Transvaginal  Result Date: 08/15/2021 CLINICAL DATA:  Question pregnancy viability.  Pelvic pain. EXAM: OBSTETRIC <14 WK [place] AND TRANSVAGINAL OB US TECHNIQUE: Both transabdominal and transvaginal ultrasound examinations were performed for complete evaluation of the gestation as well as the maternal uterus, adnexal regions, and pelvic cul-de-sac. Transvaginal technique was performed to assess early pregnancy. COMPARISON:  08/07/2021 FINDINGS: Intrauterine gestational sac: Present, single and normal. Yolk sac:  Present Embryo:  Present Cardiac Activity: Present Heart Rate: 114 bpm CRL:  4.9 mm   6 w   1 d                  [place] EDC: 04/09/2022 Subchorionic hemorrhage:  None visualized. Maternal uterus/adnexae: Normal appearing uterus and ovaries. Corpus luteum on the left. No free fluid. IMPRESSION: Normal appearing single intrauterine pregnancy at 6 weeks 1 day by crown-rump length. Normal cardiac activity now visible. Electronically Signed   By: Nelson Chimes M.D.   On: 08/15/2021 15:26    Luvenia Redden, PA-C 08/15/2021 3:43 PM

## 2021-08-26 ENCOUNTER — Encounter (HOSPITAL_COMMUNITY): Payer: Self-pay | Admitting: Obstetrics and Gynecology

## 2021-08-26 ENCOUNTER — Other Ambulatory Visit: Payer: Self-pay

## 2021-08-26 ENCOUNTER — Inpatient Hospital Stay (HOSPITAL_COMMUNITY)
Admission: AD | Admit: 2021-08-26 | Discharge: 2021-08-26 | Discharge status: Against Medical Advice | Payer: Medicaid Other | Attending: Obstetrics and Gynecology | Admitting: Obstetrics and Gynecology

## 2021-08-26 DIAGNOSIS — Z5329 Procedure and treatment not carried out because of patient's decision for other reasons: Secondary | ICD-10-CM | POA: Insufficient documentation

## 2021-08-26 DIAGNOSIS — O219 Vomiting of pregnancy, unspecified: Secondary | ICD-10-CM | POA: Diagnosis not present

## 2021-08-26 DIAGNOSIS — Z3A08 8 weeks gestation of pregnancy: Secondary | ICD-10-CM | POA: Insufficient documentation

## 2021-08-26 DIAGNOSIS — E118 Type 2 diabetes mellitus with unspecified complications: Secondary | ICD-10-CM | POA: Insufficient documentation

## 2021-08-26 DIAGNOSIS — O26891 Other specified pregnancy related conditions, first trimester: Secondary | ICD-10-CM | POA: Diagnosis not present

## 2021-08-26 DIAGNOSIS — O24311 Unspecified pre-existing diabetes mellitus in pregnancy, first trimester: Secondary | ICD-10-CM

## 2021-08-26 DIAGNOSIS — O24319 Unspecified pre-existing diabetes mellitus in pregnancy, unspecified trimester: Secondary | ICD-10-CM

## 2021-08-26 DIAGNOSIS — R1011 Right upper quadrant pain: Secondary | ICD-10-CM | POA: Insufficient documentation

## 2021-08-26 DIAGNOSIS — O24111 Pre-existing diabetes mellitus, type 2, in pregnancy, first trimester: Secondary | ICD-10-CM | POA: Diagnosis not present

## 2021-08-26 LAB — URINALYSIS, ROUTINE W REFLEX MICROSCOPIC
Bilirubin Urine: NEGATIVE
Glucose, UA: 50 mg/dL — AB
Ketones, ur: NEGATIVE mg/dL
Nitrite: NEGATIVE
Protein, ur: 30 mg/dL — AB
Specific Gravity, Urine: 1.017 (ref 1.005–1.030)
pH: 5 (ref 5.0–8.0)

## 2021-08-26 LAB — COMPREHENSIVE METABOLIC PANEL
ALT: 17 U/L (ref 0–44)
AST: 11 U/L — ABNORMAL LOW (ref 15–41)
Albumin: 3.1 g/dL — ABNORMAL LOW (ref 3.5–5.0)
Alkaline Phosphatase: 50 U/L (ref 38–126)
Anion gap: 6 (ref 5–15)
BUN: 14 mg/dL (ref 6–20)
CO2: 24 mmol/L (ref 22–32)
Calcium: 8.7 mg/dL — ABNORMAL LOW (ref 8.9–10.3)
Chloride: 105 mmol/L (ref 98–111)
Creatinine, Ser: 0.53 mg/dL (ref 0.44–1.00)
GFR, Estimated: >60 mL/min (ref >60)
Glucose, Bld: 168 mg/dL — ABNORMAL HIGH (ref 70–99)
Potassium: 3.8 mmol/L (ref 3.5–5.1)
Sodium: 135 mmol/L (ref 135–145)
Total Bilirubin: 0.6 mg/dL (ref 0.3–1.2)
Total Protein: 5.8 g/dL — ABNORMAL LOW (ref 6.5–8.1)

## 2021-08-26 LAB — CBC
HCT: 28.4 % — ABNORMAL LOW (ref 36.0–46.0)
Hemoglobin: 9.4 g/dL — ABNORMAL LOW (ref 12.0–15.0)
MCH: 26.5 pg (ref 26.0–34.0)
MCHC: 33.1 g/dL (ref 30.0–36.0)
MCV: 80 fL (ref 80.0–100.0)
Platelets: 161 10*3/uL (ref 150–400)
RBC: 3.55 MIL/uL — ABNORMAL LOW (ref 3.87–5.11)
RDW: 16.1 % — ABNORMAL HIGH (ref 11.5–15.5)
WBC: 4.7 10*3/uL (ref 4.0–10.5)
nRBC: 0 % (ref 0.0–0.2)

## 2021-08-26 LAB — LIPASE, BLOOD: Lipase: 32 U/L (ref 11–51)

## 2021-08-26 LAB — GLUCOSE, CAPILLARY: Glucose-Capillary: 165 mg/dL — ABNORMAL HIGH (ref 70–99)

## 2021-08-26 MED ORDER — ACETAMINOPHEN 500 MG PO TABS
1000.0000 mg | ORAL_TABLET | Freq: Once | ORAL | Status: AC
  Administered 2021-08-26: 1000 mg via ORAL
  Filled 2021-08-26: qty 2

## 2021-08-26 MED ORDER — METOCLOPRAMIDE HCL 10 MG PO TABS
10.0000 mg | ORAL_TABLET | Freq: Once | ORAL | Status: AC
  Administered 2021-08-26: 10 mg via ORAL
  Filled 2021-08-26: qty 1

## 2021-08-26 NOTE — MAU Note (Signed)
Presents c/o abdominal pain, states pain is "all over my stomach".  Reports pain hurts to touch and feels like someone is punching her.  Denies VB.  Also reports N/V, but able to keep water down. States she's shaking, doesn't have meter to check CBG.
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
## 2021-08-28 ENCOUNTER — Inpatient Hospital Stay (HOSPITAL_COMMUNITY)
Admission: AD | Admit: 2021-08-28 | Discharge: 2021-08-28 | Disposition: A | Discharge status: Home / Self Care | Payer: Medicaid Other | Attending: Family Medicine | Admitting: Family Medicine

## 2021-08-28 ENCOUNTER — Inpatient Hospital Stay (HOSPITAL_COMMUNITY): Payer: Medicaid Other

## 2021-08-28 ENCOUNTER — Other Ambulatory Visit: Payer: Self-pay

## 2021-08-28 ENCOUNTER — Encounter (HOSPITAL_COMMUNITY): Payer: Self-pay | Admitting: Family Medicine

## 2021-08-28 DIAGNOSIS — Z3A08 8 weeks gestation of pregnancy: Secondary | ICD-10-CM | POA: Diagnosis not present

## 2021-08-28 DIAGNOSIS — O219 Vomiting of pregnancy, unspecified: Secondary | ICD-10-CM

## 2021-08-28 DIAGNOSIS — R109 Unspecified abdominal pain: Secondary | ICD-10-CM | POA: Insufficient documentation

## 2021-08-28 DIAGNOSIS — O26891 Other specified pregnancy related conditions, first trimester: Secondary | ICD-10-CM

## 2021-08-28 LAB — URINALYSIS, ROUTINE W REFLEX MICROSCOPIC
Bilirubin Urine: NEGATIVE
Glucose, UA: 50 mg/dL — AB
Hgb urine dipstick: NEGATIVE
Ketones, ur: NEGATIVE mg/dL
Nitrite: NEGATIVE
Protein, ur: NEGATIVE mg/dL
Specific Gravity, Urine: 1.012 (ref 1.005–1.030)
pH: 6 (ref 5.0–8.0)

## 2021-08-28 LAB — GLUCOSE, CAPILLARY: Glucose-Capillary: 210 mg/dL — ABNORMAL HIGH (ref 70–99)

## 2021-08-28 MED ORDER — SODIUM CHLORIDE 0.9 % IV SOLN
8.0000 mg | Freq: Once | INTRAVENOUS | Status: AC
  Administered 2021-08-28: 8 mg via INTRAVENOUS
  Filled 2021-08-28: qty 4

## 2021-08-28 MED ORDER — FAMOTIDINE IN NACL 20-0.9 MG/50ML-% IV SOLN
20.0000 mg | Freq: Once | INTRAVENOUS | Status: AC
  Administered 2021-08-28: 20 mg via INTRAVENOUS
  Filled 2021-08-28: qty 50

## 2021-08-28 MED ORDER — PROMETHAZINE HCL 12.5 MG PO TABS: 12.5000 mg | ORAL_TABLET | Freq: Four times a day (QID) | ORAL | 3 refills | Status: DC | PRN

## 2021-08-28 MED ORDER — PANTOPRAZOLE SODIUM 40 MG PO TBEC: 40.0000 mg | DELAYED_RELEASE_TABLET | Freq: Every day | ORAL | 3 refills | Status: DC

## 2021-08-28 MED ORDER — PROMETHAZINE HCL 25 MG/ML IJ SOLN
25.0000 mg | Freq: Once | INTRAMUSCULAR | Status: AC
  Administered 2021-08-28: 25 mg via INTRAMUSCULAR
  Filled 2021-08-28: qty 1

## 2021-08-28 MED ORDER — LACTATED RINGERS IV BOLUS
1000.0000 mL | Freq: Once | INTRAVENOUS | Status: AC
  Administered 2021-08-28: 1000 mL via INTRAVENOUS

## 2021-08-28 NOTE — MAU Note (Signed)
Joann Meyers is a 35 y.o. at [redacted]w[redacted]d here in MAU reporting: having abdominal pain. States pain has been going on since previous visit. No bleeding or discharge.   Onset of complaint: ongoing  Pain score: 10/10  Vitals:   08/28/21 0925  BP: 140/76  Pulse: 76  Resp: 18  Temp: 98.1 F (36.7 C)  SpO2: 99%     Lab orders placed from triage: UA
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
## 2021-09-15 ENCOUNTER — Encounter: Payer: Self-pay | Admitting: Obstetrics & Gynecology

## 2021-09-15 ENCOUNTER — Other Ambulatory Visit: Payer: Self-pay

## 2021-09-15 ENCOUNTER — Ambulatory Visit (INDEPENDENT_AMBULATORY_CARE_PROVIDER_SITE_OTHER): Payer: Medicaid Other | Admitting: Obstetrics & Gynecology

## 2021-09-15 VITALS — BP 145/81 | HR 85 | Wt 195.6 lb

## 2021-09-15 DIAGNOSIS — O99011 Anemia complicating pregnancy, first trimester: Secondary | ICD-10-CM

## 2021-09-15 DIAGNOSIS — I7771 Dissection of carotid artery: Secondary | ICD-10-CM

## 2021-09-15 DIAGNOSIS — O09299 Supervision of pregnancy with other poor reproductive or obstetric history, unspecified trimester: Secondary | ICD-10-CM | POA: Diagnosis not present

## 2021-09-15 DIAGNOSIS — O24319 Unspecified pre-existing diabetes mellitus in pregnancy, unspecified trimester: Secondary | ICD-10-CM | POA: Diagnosis not present

## 2021-09-15 DIAGNOSIS — O10019 Pre-existing essential hypertension complicating pregnancy, unspecified trimester: Secondary | ICD-10-CM

## 2021-09-15 DIAGNOSIS — Z5941 Food insecurity: Secondary | ICD-10-CM

## 2021-09-15 DIAGNOSIS — Z8673 Personal history of transient ischemic attack (TIA), and cerebral infarction without residual deficits: Secondary | ICD-10-CM

## 2021-09-15 DIAGNOSIS — Z3A11 11 weeks gestation of pregnancy: Secondary | ICD-10-CM

## 2021-09-15 DIAGNOSIS — E119 Type 2 diabetes mellitus without complications: Secondary | ICD-10-CM

## 2021-09-15 DIAGNOSIS — O34219 Maternal care for unspecified type scar from previous cesarean delivery: Secondary | ICD-10-CM | POA: Insufficient documentation

## 2021-09-15 DIAGNOSIS — O0990 Supervision of high risk pregnancy, unspecified, unspecified trimester: Secondary | ICD-10-CM

## 2021-09-15 MED ORDER — ACCU-CHEK GUIDE W/DEVICE KIT: PACK | 0 refills | Status: DC

## 2021-09-15 MED ORDER — ACCU-CHEK SOFTCLIX LANCETS MISC: 100.0000 | Freq: Four times a day (QID) | 12 refills | Status: DC

## 2021-09-15 MED ORDER — BLOOD PRESSURE KIT DEVI: 1.0000 | Freq: Every day | 0 refills | Status: DC

## 2021-09-15 MED ORDER — ASPIRIN EC 81 MG PO TBEC: 81.0000 mg | DELAYED_RELEASE_TABLET | Freq: Every day | ORAL | 2 refills | Status: DC

## 2021-09-15 MED ORDER — GLUCOSE BLOOD VI STRP: ORAL_STRIP | 12 refills | Status: DC
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
## 2021-09-16 DIAGNOSIS — O09899 Supervision of other high risk pregnancies, unspecified trimester: Secondary | ICD-10-CM | POA: Insufficient documentation

## 2021-09-16 DIAGNOSIS — O99012 Anemia complicating pregnancy, second trimester: Secondary | ICD-10-CM | POA: Insufficient documentation

## 2021-09-16 DIAGNOSIS — O99011 Anemia complicating pregnancy, first trimester: Secondary | ICD-10-CM | POA: Insufficient documentation

## 2021-09-16 LAB — CBC/D/PLT+RPR+RH+ABO+RUBIGG...
Antibody Screen: NEGATIVE
Basophils Absolute: 0 10*3/uL (ref 0.0–0.2)
Basos: 1 %
EOS (ABSOLUTE): 0.1 10*3/uL (ref 0.0–0.4)
Eos: 3 %
HCV Ab: 0.2 s/co ratio (ref 0.0–0.9)
HIV Screen 4th Generation wRfx: NONREACTIVE
Hematocrit: 31.9 % — ABNORMAL LOW (ref 34.0–46.6)
Hemoglobin: 10.4 g/dL — ABNORMAL LOW (ref 11.1–15.9)
Hepatitis B Surface Ag: NEGATIVE
Immature Grans (Abs): 0 10*3/uL (ref 0.0–0.1)
Immature Granulocytes: 0 %
Lymphocytes Absolute: 0.8 10*3/uL (ref 0.7–3.1)
Lymphs: 14 %
MCH: 26.2 pg — ABNORMAL LOW (ref 26.6–33.0)
MCHC: 32.6 g/dL (ref 31.5–35.7)
MCV: 80 fL (ref 79–97)
Monocytes Absolute: 0.4 10*3/uL (ref 0.1–0.9)
Monocytes: 8 %
Neutrophils Absolute: 4.2 10*3/uL (ref 1.4–7.0)
Neutrophils: 74 %
Platelets: 169 10*3/uL (ref 150–450)
RBC: 3.97 x10E6/uL (ref 3.77–5.28)
RDW: 15.3 % (ref 11.7–15.4)
RPR Ser Ql: NONREACTIVE
Rh Factor: POSITIVE
Rubella Antibodies, IGG: <0.9 index — ABNORMAL LOW (ref >0.99)
WBC: 5.6 10*3/uL (ref 3.4–10.8)

## 2021-09-16 LAB — PROTEIN / CREATININE RATIO, URINE
Creatinine, Urine: 39.3 mg/dL
Protein, Ur: 66.1 mg/dL
Protein/Creat Ratio: 1682 mg/g creat — ABNORMAL HIGH (ref 0–200)

## 2021-09-16 LAB — COMPREHENSIVE METABOLIC PANEL
ALT: 11 IU/L (ref 0–32)
AST: 9 IU/L (ref 0–40)
Albumin/Globulin Ratio: 1.8 (ref 1.2–2.2)
Albumin: 4.1 g/dL (ref 3.8–4.8)
Alkaline Phosphatase: 72 IU/L (ref 44–121)
BUN/Creatinine Ratio: 38 — ABNORMAL HIGH (ref 9–23)
BUN: 18 mg/dL (ref 6–20)
Bilirubin Total: 0.4 mg/dL (ref 0.0–1.2)
CO2: 21 mmol/L (ref 20–29)
Calcium: 9.2 mg/dL (ref 8.7–10.2)
Chloride: 101 mmol/L (ref 96–106)
Creatinine, Ser: 0.47 mg/dL — ABNORMAL LOW (ref 0.57–1.00)
Globulin, Total: 2.3 g/dL (ref 1.5–4.5)
Glucose: 225 mg/dL — ABNORMAL HIGH (ref 70–99)
Potassium: 4.7 mmol/L (ref 3.5–5.2)
Sodium: 134 mmol/L (ref 134–144)
Total Protein: 6.4 g/dL (ref 6.0–8.5)
eGFR: 128 mL/min/{1.73_m2} (ref >59)

## 2021-09-16 LAB — HCV INTERPRETATION

## 2021-09-16 LAB — TSH: TSH: 3.82 u[IU]/mL (ref 0.450–4.500)

## 2021-09-16 LAB — HEMOGLOBIN A1C
Est. average glucose Bld gHb Est-mCnc: 166 mg/dL
Hgb A1c MFr Bld: 7.4 % — ABNORMAL HIGH (ref 4.8–5.6)

## 2021-09-16 MED ORDER — FERROUS SULFATE 325 (65 FE) MG PO TABS: 325.0000 mg | ORAL_TABLET | ORAL | 3 refills | Status: DC

## 2021-09-16 NOTE — Addendum Note (Signed)
Addended by: Verita Schneiders A on: 09/16/2021 09:26 AM   Modules accepted: Orders

## 2021-09-17 ENCOUNTER — Encounter: Payer: Self-pay | Admitting: Obstetrics & Gynecology

## 2021-09-17 DIAGNOSIS — O1210 Gestational proteinuria, unspecified trimester: Secondary | ICD-10-CM | POA: Insufficient documentation

## 2021-09-17 LAB — URINE CULTURE, OB REFLEX

## 2021-09-17 LAB — CULTURE, OB URINE

## 2021-09-21 ENCOUNTER — Other Ambulatory Visit: Payer: Self-pay | Admitting: Obstetrics & Gynecology

## 2021-09-21 ENCOUNTER — Other Ambulatory Visit: Payer: Self-pay

## 2021-09-21 ENCOUNTER — Encounter: Payer: Medicaid Other | Attending: Obstetrics & Gynecology | Admitting: Registered"

## 2021-09-21 ENCOUNTER — Ambulatory Visit: Payer: Medicaid Other | Admitting: Registered"

## 2021-09-21 DIAGNOSIS — O24319 Unspecified pre-existing diabetes mellitus in pregnancy, unspecified trimester: Secondary | ICD-10-CM

## 2021-09-21 DIAGNOSIS — O24119 Pre-existing diabetes mellitus, type 2, in pregnancy, unspecified trimester: Secondary | ICD-10-CM | POA: Diagnosis not present

## 2021-09-21 DIAGNOSIS — Z3A00 Weeks of gestation of pregnancy not specified: Secondary | ICD-10-CM | POA: Diagnosis not present

## 2021-09-21 DIAGNOSIS — E119 Type 2 diabetes mellitus without complications: Secondary | ICD-10-CM

## 2021-09-21 MED ORDER — METFORMIN HCL 1000 MG PO TABS
1000.0000 mg | ORAL_TABLET | Freq: Two times a day (BID) | ORAL | 5 refills | Status: DC
Start: 2021-09-21 — End: 2022-01-24

## 2021-09-21 NOTE — Progress Notes (Signed)
Start time:  0820   End time:  0915  Patient seen on 09/21/21 for Type 2 Diabetes in pregnancy and PRE-POD training to prepare for potential use of the Omnipod DASH Insulin Pump.  EDD 04/06/2022   A1c 7.4% Patient has a history of insulin controlled diabetes in pregnancy  Medication: Metformin 1000 bid. Patient states hospital discharge with metformin and needs more. RD notified MD and prescription was refilled. Pt states her blood sugar was 300-something when going to the hospital.  SMBG: patient did not bring log sheet, reports she had a recent reading of 192 mg/dL after dinner of chicken sandwich, fries and peach Sprite. Pt states she usually doesn't drink soda and her FBS has been ~82 and after meals less than 120 mg/dL. Patient downloaded MySugr app in the office.   Hypoglycemia: Pt [place] event 53 mg/dL  Pt states she has not had an eye exam since 2015.  In addition to reviewing basics of self-management of blood sugar control, in anticipation of patient needing insulin, we also reviewed options for insulin delivery (Omnipod DASH) and CGM (Dexcom).   Patient will return next week for follow up for blood sugar review and continued counseling.  Patient to contact me via MyChart or phone.

## 2021-09-21 NOTE — Progress Notes (Signed)
Metformin 1000 mg po bid prescribed for patient, after her meeting with Diabetic Coordinator.  Will continue to monitor her blood sugars as recommended.   Verita Schneiders, MD, [place] for [employer], [place]

## 2021-09-21 NOTE — Patient Instructions (Signed)
Check blood sugar 4 times per day Connect MySugr app to your accu chek meter Write blood sugar readings on the glucose log sheet and bring with you to all appointments  Find out who your mom goes to for her eye exams and let me know so we can get a referral for you to see them  Remember to take your metformin at night with food  Return with blood sugar log for follow-up visit for review and we will start the next steps to get supplies for Dexcom CGM and Omnipod insulin pump

## 2021-09-26 ENCOUNTER — Ambulatory Visit: Payer: Medicaid Other | Admitting: Registered"

## 2021-09-29 ENCOUNTER — Encounter: Payer: Medicaid Other | Admitting: Family Medicine

## 2021-10-05 ENCOUNTER — Telehealth: Payer: Self-pay | Admitting: Lactation Services

## 2021-10-05 DIAGNOSIS — O285 Abnormal chromosomal and genetic finding on antenatal screening of mother: Secondary | ICD-10-CM

## 2021-10-05 NOTE — Addendum Note (Signed)
Addended by: Donn Pierini on: 10/05/2021 04:47 PM   Modules accepted: Orders

## 2021-10-05 NOTE — Telephone Encounter (Signed)
Called MFM and schedule Genetic Counseling due to abnormal Panorama showing low fetal fraction and High Risk for Triploidy, Trisomy 18 or Trisomy 68. MFM will call back once sonographer is consulted about need for [place] and Genetic Counseling.   Called patient to inform her of results and recommendations. She did not answer. Asked her to call the office for results. Asked her to check her My Chart message.   My Chart message sent.

## 2021-10-05 NOTE — Telephone Encounter (Addendum)
Called patient again to discuss results and scheduling of Genetic Screening.   Explained to patient that Lucina Mellow shows there may be a chromosomal abnormality in the infant and further testing and Genetic Counseling is recommended to clarify results.   [place] and Genetic Counseling scheduled for Friday at 9:45 with Maternal Fetal medicine. Advised to come by 9:30 with full bladder. Partner is not in the country. Patient was informed cannot bring children to appointment. Patient agreed to appointment date and time. Location of appointment given.

## 2021-10-07 ENCOUNTER — Other Ambulatory Visit: Payer: Self-pay

## 2021-10-07 ENCOUNTER — Other Ambulatory Visit: Payer: Self-pay | Admitting: Obstetrics & Gynecology

## 2021-10-07 ENCOUNTER — Ambulatory Visit: Payer: Medicaid Other

## 2021-10-07 ENCOUNTER — Ambulatory Visit (HOSPITAL_BASED_OUTPATIENT_CLINIC_OR_DEPARTMENT_OTHER): Payer: Medicaid Other

## 2021-10-07 ENCOUNTER — Ambulatory Visit: Payer: Medicaid Other | Admitting: *Deleted

## 2021-10-07 ENCOUNTER — Encounter: Payer: Self-pay | Admitting: *Deleted

## 2021-10-07 ENCOUNTER — Ambulatory Visit: Payer: Medicaid Other | Attending: Obstetrics and Gynecology

## 2021-10-07 VITALS — BP 144/77 | HR 81

## 2021-10-07 DIAGNOSIS — O09299 Supervision of pregnancy with other poor reproductive or obstetric history, unspecified trimester: Secondary | ICD-10-CM

## 2021-10-07 DIAGNOSIS — O1210 Gestational proteinuria, unspecified trimester: Secondary | ICD-10-CM | POA: Insufficient documentation

## 2021-10-07 DIAGNOSIS — Z363 Encounter for antenatal screening for malformations: Secondary | ICD-10-CM

## 2021-10-07 DIAGNOSIS — O280 Abnormal hematological finding on antenatal screening of mother: Secondary | ICD-10-CM

## 2021-10-07 DIAGNOSIS — O34219 Maternal care for unspecified type scar from previous cesarean delivery: Secondary | ICD-10-CM | POA: Diagnosis present

## 2021-10-07 NOTE — Progress Notes (Signed)
Name: Sayana Hedtke Indication: Abnormal NIPS Result  DOB: 1986/07/23 Age: 35 y.o.   EDC: 04/10/2022 LMP: 06/30/2021 Referring Provider:  Osborne Oman, MD   EGA: [redacted]w[redacted]d Genetic Counselor: Staci Righter, MS, CGC  OB Hx: [id number] Date of Appointment: 10/07/2021  Accompanied by: Kristine Linea Face to Face Time: 30 Minutes     Genetic Counseling:   Aneuploidy Risk. Genetic counseling reviewed with Charnette that her Non-Invasive Prenatal Screening (NIPS) result is high risk due to a low fetal fraction (2.3%). Fetal fraction is an important quality control metric. The laboratory reports there was insufficient fetal DNA in the sample to obtain a reliable result using standard NIPS methods. Therefore, an additional proprietary analysis was performed, incorporating fetal fraction, maternal age, maternal weight, and gestational age. Based upon the results of the additional analysis, the pregnancy is considered to have a high risk for Triploidy, Trisomy 55, and Trisomy 43. The risks for Trisomy 21 and Monosomy X are not changed. Given this result Aisia has the option to complete diagnostic testing via CVS/Amniocentesis. Genetic counseling also reviewed with Kadince that Johnsie Cancel will accept a repeat blood sample. Deette expressed that she is not interested in invasive testing at this time and would like to have a repeat NIPS blood sample drawn in our office today. In Snow's pregnancy in 2021 her NIPS resulted high risk due to a low fetal fraction, however, a redraw of the NIPS resulted low risk.   Birth Defects. All babies have approximately a 3-5% risk for a birth defect and a majority of these defects cannot be detected through the screening or diagnostic testing listed below. Ultrasound may detect some birth defects, but it may not detect all birth defects. About half of pregnancies with Down syndrome do not show any soft markers on ultrasound. A normal ultrasound does not guarantee a healthy  pregnancy.  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 If her reproductive partner is also a carrier for this condition, their chance to have an affected pregnancy/child is 1 in 4 (25%). Carrier screening for Henri's reproductive partner is indicated if it has not already been completed. Genetic counseling did not address this with Cynth[facility]. Genetic counseling called [place] on 10/07/2021 at 1:53pm and left a detailed voicemail regarding this information including the [facility] call back number. We will attempt to discuss carrier testing for Waylynn's reproductive partner again when we call her with her NIPS redraw results.    Testing/Screening Options:   CVS/Amniocentesis. These procedures are available for prenatal diagnosis. Possible procedural difficulties and complications that can arise with these procedures include maternal infection, cramping, bleeding, fluid leakage, and/or pregnancy loss. The risk for pregnancy loss with a CVS is 1/300-500. The risk for pregnancy loss with an amniocentesis is 1/500-1,000. Per the [employer] of Obstetricians and Gynecologists (ACOG) Practice Bulletin 162, all pregnant women should be offered prenatal assessment for aneuploidy by diagnostic testing regardless of maternal age or other risk factors. If indicated, genetic testing that could be ordered on a CVS or amniocentesis sample includes a fetal karyotype, fetal microarray, and testing for specific syndromes.  Non-invasive prenatal screening (NIPS). This can screen the pregnancy for aneuploidy involving chromosomes 13, 18, 21, X, and Y. If NIPS results indicate high-risk for a chromosomal  aneuploidy, prenatal diagnosis via CVS or  amniocentesis would be recommended. A low-risk NIPS result does not ensure an unaffected pregnancy. NIPS does not screen for neural tube defects or other genetic conditions. Per the ACOG Practice Bulletin 45 all pregnant women regardless of age, baseline risk, and number of fetuses should be offered all screening options including NIPS which was previously only offered for singleton high risk pregnancies.   Thank you for sharing in the care of [place] with [place].  Please do not hesitate to contact us if you have any questions.  Staci Righter, MS, [facility]

## 2021-10-13 ENCOUNTER — Other Ambulatory Visit: Payer: Self-pay

## 2021-10-13 ENCOUNTER — Telehealth (INDEPENDENT_AMBULATORY_CARE_PROVIDER_SITE_OTHER): Payer: Medicaid Other | Admitting: Family Medicine

## 2021-10-13 DIAGNOSIS — O24312 Unspecified pre-existing diabetes mellitus in pregnancy, second trimester: Secondary | ICD-10-CM

## 2021-10-13 DIAGNOSIS — O0990 Supervision of high risk pregnancy, unspecified, unspecified trimester: Secondary | ICD-10-CM

## 2021-10-13 DIAGNOSIS — O09892 Supervision of other high risk pregnancies, second trimester: Secondary | ICD-10-CM

## 2021-10-13 DIAGNOSIS — O10012 Pre-existing essential hypertension complicating pregnancy, second trimester: Secondary | ICD-10-CM

## 2021-10-13 DIAGNOSIS — R569 Unspecified convulsions: Secondary | ICD-10-CM

## 2021-10-13 DIAGNOSIS — O99412 Diseases of the circulatory system complicating pregnancy, second trimester: Secondary | ICD-10-CM

## 2021-10-13 DIAGNOSIS — E119 Type 2 diabetes mellitus without complications: Secondary | ICD-10-CM

## 2021-10-13 DIAGNOSIS — O09899 Supervision of other high risk pregnancies, unspecified trimester: Secondary | ICD-10-CM

## 2021-10-13 DIAGNOSIS — Z3A15 15 weeks gestation of pregnancy: Secondary | ICD-10-CM

## 2021-10-13 DIAGNOSIS — O1212 Gestational proteinuria, second trimester: Secondary | ICD-10-CM

## 2021-10-13 DIAGNOSIS — Z8673 Personal history of transient ischemic attack (TIA), and cerebral infarction without residual deficits: Secondary | ICD-10-CM

## 2021-10-13 DIAGNOSIS — O09299 Supervision of pregnancy with other poor reproductive or obstetric history, unspecified trimester: Secondary | ICD-10-CM

## 2021-10-13 DIAGNOSIS — I7771 Dissection of carotid artery: Secondary | ICD-10-CM

## 2021-10-13 DIAGNOSIS — O34219 Maternal care for unspecified type scar from previous cesarean delivery: Secondary | ICD-10-CM

## 2021-10-13 DIAGNOSIS — Z2839 Other underimmunization status: Secondary | ICD-10-CM

## 2021-10-13 DIAGNOSIS — O09292 Supervision of pregnancy with other poor reproductive or obstetric history, second trimester: Secondary | ICD-10-CM

## 2021-10-13 DIAGNOSIS — O99212 Obesity complicating pregnancy, second trimester: Secondary | ICD-10-CM

## 2021-10-13 DIAGNOSIS — O99352 Diseases of the nervous system complicating pregnancy, second trimester: Secondary | ICD-10-CM

## 2021-10-13 DIAGNOSIS — O10019 Pre-existing essential hypertension complicating pregnancy, unspecified trimester: Secondary | ICD-10-CM

## 2021-10-13 DIAGNOSIS — O1210 Gestational proteinuria, unspecified trimester: Secondary | ICD-10-CM

## 2021-10-13 DIAGNOSIS — O24319 Unspecified pre-existing diabetes mellitus in pregnancy, unspecified trimester: Secondary | ICD-10-CM

## 2021-10-13 DIAGNOSIS — O99210 Obesity complicating pregnancy, unspecified trimester: Secondary | ICD-10-CM

## 2021-10-13 MED ORDER — PREPLUS 27-1 MG PO TABS: 1.0000 | ORAL_TABLET | Freq: Every day | ORAL | 11 refills | Status: DC

## 2021-10-13 MED ORDER — LABETALOL HCL 300 MG PO TABS: 300.0000 mg | ORAL_TABLET | Freq: Two times a day (BID) | ORAL | 2 refills | Status: DC

## 2021-10-13 MED ORDER — INSULIN ASPART 100 UNIT/ML IJ SOLN: 5.0000 [IU] | Freq: Three times a day (TID) | INTRAMUSCULAR | 12 refills | Status: DC

## 2021-10-13 MED ORDER — INSULIN NPH (HUMAN) (ISOPHANE) 100 UNIT/ML ~~LOC~~ SUSP: 10.0000 [IU] | Freq: Two times a day (BID) | SUBCUTANEOUS | 3 refills | Status: DC

## 2021-10-13 MED ORDER — ASPIRIN EC 81 MG PO TBEC
81.0000 mg | DELAYED_RELEASE_TABLET | Freq: Every day | ORAL | 2 refills | Status: DC
Start: 2021-10-13 — End: 2022-01-24

## 2021-10-13 NOTE — Progress Notes (Signed)
Patient reports recent headaches and feeling shaky. Also reports abdominal pain/pressure the past 5 days that does not improve with tylenol. Patient is currently at work and cannot check her blood pressure but states she checked it last night and it was 180/98. Asked patient if she could come into the office today for a BP check and she states she cannot because she gets off work at [place].

## 2021-10-13 NOTE — Progress Notes (Signed)
° °OBSTETRICS PRENATAL VIRTUAL VISIT ENCOUNTER NOTE ° °Provider location: [facility]  ° °Patient location: Home ° °I connected with Joann Meyers on 10/13/21 at  1:35 PM EST by MyChart Video Encounter and verified that I am speaking with the correct person using two identifiers. I discussed the limitations, risks, security and privacy concerns of performing an evaluation and management service virtually and the availability of in person appointments. I also discussed with the patient that there may be a patient responsible charge related to this service. The patient expressed understanding and agreed to proceed. °Subjective:  °Joann Meyers is a 35 y.o. G2P0101 at [redacted]w[redacted]d being seen today for ongoing prenatal care.  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 ° °Patient reports backache and abdokminal pain .  Contractions: Not present. Vag. Bleeding: None.  Movement: Absent. Denies any leaking of fluid.  ° °The following portions of the patient's history were reviewed and updated as appropriate: allergies, current medications, past family history, past medical history, past social history, past surgical history and problem list.  ° °Objective:  °There were no vitals filed for this  visit. ° °Fetal Status:     Movement: Absent    ° °General:  Alert, oriented and cooperative. Patient is in no acute distress.  °Respiratory: Normal respiratory effort, no problems with respiration noted  °Mental Status: Normal mood and affect. Normal behavior. Normal judgment and thought content.  °Rest of physical exam deferred due to type of encounter ° °Imaging: °US MFM OB COMP + 14 WK ° °Result Date: 10/07/2021 °----------------------------------------------------------------------  OBSTETRICS REPORT                       (Signed Final 10/07/2021 02:14 pm) ---------------------------------------------------------------------- Patient Info  ID #:       [id number]                          D.O.B.:  10/02/1986 (35 yrs)  Name:       Joann Meyers                 Visit Date: 10/07/2021 10:18 am ---------------------------------------------------------------------- Performed By  Attending:        Ravi Shankar MD        Ref. Address:     [address]                                                             [place], [place]                                                               [id number]  Performed By:     Vance Peper BS,      [facility]                                                             [place] [facility]  Referred By:      [facility] ---------------------------------------------------------------------- Orders  #  Description                           Code        Ordered By  1  [place] MFM OB COMP + 41 WK                76805.01    Verita Schneiders ----------------------------------------------------------------------  #  Order #                     Accession #                Episode #  1  [id number]                   [id number]                 [id number]  ---------------------------------------------------------------------- Indications  Abnormal biochemical finding on antenatal      O28.1  screening of mother (panorama increased  risk triploidy  Poor obstetric history: Previous preeclampsia  O09.299  Poor obstetric history: Previous preterm       O09.219  delivery, antepartum  Hypertension - Chronic/Pre-existing            Z66.294  Obesity complicating pregnancy, second         O99.212  trimester  Gestational diabetes in pregnancy,             O24.415  controlled by oral hypoglycemic drugs  (metformin)  Medical complication of pregnancy (previous    O26.90  carotid artery dissection/history TIA and  CVA)  Medium Chain Acyl-CoA Dehydrogenas  Deficiency  Medical complication of pregnancy (seizures)   O26.90  [redacted] weeks gestation of pregnancy                Z3A.14 ---------------------------------------------------------------------- Fetal Evaluation  Num Of Fetuses:         1  Fetal Heart Rate(bpm):  152  Cardiac Activity:       Observed  Presentation:  Cephalic  Placenta:               Posterior  P. Cord Insertion:      Not well visualized  Amniotic Fluid  AFI FV:      Within normal limits ---------------------------------------------------------------------- Biometry  BPD:      26.3  mm     G. Age:  14w 4d                  CI:        73.85   %    70 - 86                                                          FL/HC:      12.9   %  HC:       97.2  mm     G. Age:  14w 3d                  HC/AC:      1.21        1.14 - 1.31  AC:       80.1  mm     G. Age:  14w 3d                  FL/BPD:     47.5   %  FL:       12.5  mm     G. Age:  13w 5d                  FL/AC:      15.6   %    20 - 24  HUM:      14.1  mm     G. Age:  13w 6d  CER:        10  mm     G. Age:  N/A  Est. FW:      90  gm      0 lb 3 oz ---------------------------------------------------------------------- OB History  Gravidity:    2         Term:   1  Living:       1  ---------------------------------------------------------------------- Gestational Age  LMP:           14w 1d        Date:  06/30/21                 EDD:   04/06/22  U/S Today:     14w 2d                                        EDD:   04/05/22  Best:          14w 1d     Det. By:  LMP  (06/30/21)          EDD:   04/06/22 ---------------------------------------------------------------------- Anatomy  Cranium:               Appears normal         Aortic Arch:            Not well visualized  Cavum:  Appears normal         Ductal Arch:            Not well visualized  Ventricles:            Appears normal         Diaphragm:              Not well visualized  Choroid Plexus:        Appears normal         Stomach:                Appears normal, left                                                                        sided  Cerebellum:            Not well visualized    Abdomen:                Appears normal  Posterior Fossa:       Appears normal         Abdominal Wall:         Not well visualized  Face:                  Not well visualized    Cord Vessels:           Appears normal (3                                                                        vessel cord)  Lips:                  Not well visualized    Kidneys:                Appear normal  Palate:                Not well visualized    Bladder:                Appears normal  Thoracic:              Appears normal         Spine:                  Not well visualized  Heart:                 Not well visualized    Upper Extremities:      Appears normal  RVOT:                  Not well visualized    Lower Extremities:      Appears normal  LVOT:                  Not well visualized  Other:  Open hands visualized. Technically difficult due to maternal habitus          and  early GA. ---------------------------------------------------------------------- Impression  G2 P1.  Patient had abnormal cell free fetal DNA screening  which showed increased risk for  trisomy 18, trisomy 13 and  triploidy.  She has type 2 diabetes and takes metformin for control.  Past medical history significant for carotid artery dissection in  2018 with good recovery.  Obstetrical history significant for a preterm cesarean delivery  at [redacted] weeks gestation.  Her pregnancy was complicated by  severe fetal growth restriction.  On today's ultrasound, fetal biometry is consistent with her  established dates.  Amniotic fluid is normal good fetal activity  seen.  Fetal anatomical survey is very limited because of  early gestational age.  I counseled her on the procedure of chorionic villous  sampling (now) and amniocentesis (up to 16 weeks) for a  definitive result on the fetal karyotype.  Patient met with our  genetic counselor after ultrasound and opted to have a repeat  blood sample for cell free fetal DNA screening.  Patient was sent to [facility] to have her blood drawn for cell  free fetal DNA screening. ---------------------------------------------------------------------- Recommendations  She has an appointment for fetal anatomical survey. ----------------------------------------------------------------------                  Ravi Shankar, MD Electronically Signed Final Report   10/07/2021 02:14 pm ----------------------------------------------------------------------  ° °Assessment and Plan:  °Pregnancy: G2P0101 at [redacted]w[redacted]d °1. 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HCC) °Needs good blood pressure control ° °2. Pre-existing essential hypertension during pregnancy, antepartum °BP per her report are in the 180/90s °Increased her Labetalol to 300 mg bid ° ° °3. Preexisting diabetes complicating pregnancy, antepartum °Reports CBGs 139-193 °Will add insulin to her Metformin ° °4. History of TIA (transient ischemic attack) and stroke °Trauma based ° °5. Supervision of high risk pregnancy, antepartum °Abnormal NIPT with [facility], 2nd sample pending with MFM ° °6. Rubella non-immune status, antepartum °Will need MMR  pp ° °7. Previous cesarean delivery, antepartum °Interested in TOLAC ° °8. Seizures (HCC) ° ° °9. Obesity in pregnancy, antepartum ° ° °10. History of severe preeclampsia, prior pregnancy, currently pregnant °ON ASA ° °11. Proteinuria affecting pregnancy, antepartum °1682 mg at New OB visit ° °General obstetric precautions including but not limited to vaginal bleeding, contractions, leaking of fluid and fetal movement were reviewed in detail with the patient. °I discussed the assessment and treatment plan with the patient. The patient was provided an opportunity to ask questions and all were answered. The patient agreed with the plan and demonstrated an understanding of the instructions. The patient was advised to call back or seek an in-person office evaluation/[facility] for any urgent or concerning symptoms. °Please refer to After Visit Summary for other counseling recommendations.  ° °I provided 11 minutes of face-to-face time during this encounter. ° °No follow-ups on file. ° °Future Appointments  °Date Time Provider Department Center  °11/10/2021 12:30 PM [facility] NURSE [facility]  °11/10/2021 12:45 PM [facility] US4 [facility]  °11/21/2021  4:45 PM Johnston, Angela D, RD [facility]  ° ° °Tanya S Pratt, MD °[facility], [facility] ° °

## 2021-10-14 ENCOUNTER — Inpatient Hospital Stay (HOSPITAL_COMMUNITY)
Admission: AD | Admit: 2021-10-14 | Discharge: 2021-10-14 | Disposition: A | Discharge status: Home / Self Care | Payer: Medicaid Other | Attending: Obstetrics & Gynecology | Admitting: Obstetrics & Gynecology

## 2021-10-14 ENCOUNTER — Encounter (HOSPITAL_COMMUNITY): Payer: Self-pay | Admitting: Obstetrics & Gynecology

## 2021-10-14 ENCOUNTER — Other Ambulatory Visit: Payer: Self-pay

## 2021-10-14 DIAGNOSIS — O99212 Obesity complicating pregnancy, second trimester: Secondary | ICD-10-CM | POA: Diagnosis not present

## 2021-10-14 DIAGNOSIS — O24112 Pre-existing diabetes mellitus, type 2, in pregnancy, second trimester: Secondary | ICD-10-CM | POA: Diagnosis not present

## 2021-10-14 DIAGNOSIS — R569 Unspecified convulsions: Secondary | ICD-10-CM | POA: Diagnosis not present

## 2021-10-14 DIAGNOSIS — O1210 Gestational proteinuria, unspecified trimester: Secondary | ICD-10-CM

## 2021-10-14 DIAGNOSIS — D508 Other iron deficiency anemias: Secondary | ICD-10-CM | POA: Diagnosis not present

## 2021-10-14 DIAGNOSIS — Z98891 History of uterine scar from previous surgery: Secondary | ICD-10-CM | POA: Diagnosis not present

## 2021-10-14 DIAGNOSIS — O09299 Supervision of pregnancy with other poor reproductive or obstetric history, unspecified trimester: Secondary | ICD-10-CM

## 2021-10-14 DIAGNOSIS — O112 Pre-existing hypertension with pre-eclampsia, second trimester: Secondary | ICD-10-CM | POA: Diagnosis not present

## 2021-10-14 DIAGNOSIS — O34219 Maternal care for unspecified type scar from previous cesarean delivery: Secondary | ICD-10-CM | POA: Diagnosis not present

## 2021-10-14 DIAGNOSIS — Z7984 Long term (current) use of oral hypoglycemic drugs: Secondary | ICD-10-CM | POA: Insufficient documentation

## 2021-10-14 DIAGNOSIS — R42 Dizziness and giddiness: Secondary | ICD-10-CM | POA: Diagnosis present

## 2021-10-14 DIAGNOSIS — R739 Hyperglycemia, unspecified: Secondary | ICD-10-CM

## 2021-10-14 DIAGNOSIS — Z3A15 15 weeks gestation of pregnancy: Secondary | ICD-10-CM | POA: Diagnosis not present

## 2021-10-14 DIAGNOSIS — E1165 Type 2 diabetes mellitus with hyperglycemia: Secondary | ICD-10-CM | POA: Diagnosis not present

## 2021-10-14 DIAGNOSIS — Z794 Long term (current) use of insulin: Secondary | ICD-10-CM | POA: Diagnosis not present

## 2021-10-14 DIAGNOSIS — Z79899 Other long term (current) drug therapy: Secondary | ICD-10-CM | POA: Diagnosis not present

## 2021-10-14 DIAGNOSIS — O26892 Other specified pregnancy related conditions, second trimester: Secondary | ICD-10-CM | POA: Insufficient documentation

## 2021-10-14 DIAGNOSIS — O0992 Supervision of high risk pregnancy, unspecified, second trimester: Secondary | ICD-10-CM | POA: Diagnosis not present

## 2021-10-14 DIAGNOSIS — Z8673 Personal history of transient ischemic attack (TIA), and cerebral infarction without residual deficits: Secondary | ICD-10-CM | POA: Diagnosis not present

## 2021-10-14 DIAGNOSIS — O99012 Anemia complicating pregnancy, second trimester: Secondary | ICD-10-CM | POA: Diagnosis not present

## 2021-10-14 DIAGNOSIS — R109 Unspecified abdominal pain: Secondary | ICD-10-CM | POA: Insufficient documentation

## 2021-10-14 LAB — CBC
HCT: 25.9 % — ABNORMAL LOW (ref 36.0–46.0)
Hemoglobin: 8.5 g/dL — ABNORMAL LOW (ref 12.0–15.0)
MCH: 26.8 pg (ref 26.0–34.0)
MCHC: 32.8 g/dL (ref 30.0–36.0)
MCV: 81.7 fL (ref 80.0–100.0)
Platelets: 130 10*3/uL — ABNORMAL LOW (ref 150–400)
RBC: 3.17 MIL/uL — ABNORMAL LOW (ref 3.87–5.11)
RDW: 15.9 % — ABNORMAL HIGH (ref 11.5–15.5)
WBC: 5.9 10*3/uL (ref 4.0–10.5)
nRBC: 0 % (ref 0.0–0.2)

## 2021-10-14 LAB — URINALYSIS, ROUTINE W REFLEX MICROSCOPIC
Bilirubin Urine: NEGATIVE
Glucose, UA: 150 mg/dL — AB
Hgb urine dipstick: NEGATIVE
Ketones, ur: NEGATIVE mg/dL
Leukocytes,Ua: NEGATIVE
Nitrite: NEGATIVE
Protein, ur: 100 mg/dL — AB
Specific Gravity, Urine: 1.019 (ref 1.005–1.030)
pH: 5 (ref 5.0–8.0)

## 2021-10-14 LAB — COMPREHENSIVE METABOLIC PANEL
ALT: 13 U/L (ref 0–44)
AST: 15 U/L (ref 15–41)
Albumin: 2.9 g/dL — ABNORMAL LOW (ref 3.5–5.0)
Alkaline Phosphatase: 44 U/L (ref 38–126)
Anion gap: 9 (ref 5–15)
BUN: 12 mg/dL (ref 6–20)
CO2: 22 mmol/L (ref 22–32)
Calcium: 8.7 mg/dL — ABNORMAL LOW (ref 8.9–10.3)
Chloride: 101 mmol/L (ref 98–111)
Creatinine, Ser: 0.52 mg/dL (ref 0.44–1.00)
GFR, Estimated: >60 mL/min (ref >60)
Glucose, Bld: 231 mg/dL — ABNORMAL HIGH (ref 70–99)
Potassium: 3.7 mmol/L (ref 3.5–5.1)
Sodium: 132 mmol/L — ABNORMAL LOW (ref 135–145)
Total Bilirubin: 0.7 mg/dL (ref 0.3–1.2)
Total Protein: 5.8 g/dL — ABNORMAL LOW (ref 6.5–8.1)

## 2021-10-14 LAB — GLUCOSE, CAPILLARY
Glucose-Capillary: 254 mg/dL — ABNORMAL HIGH (ref 70–99)
Glucose-Capillary: 202 mg/dL — ABNORMAL HIGH (ref 70–99)
Glucose-Capillary: 178 mg/dL — ABNORMAL HIGH (ref 70–99)

## 2021-10-14 MED ORDER — ONDANSETRON 4 MG PO TBDP
8.0000 mg | ORAL_TABLET | Freq: Once | ORAL | Status: AC
  Administered 2021-10-14: 15:00:00 8 mg via ORAL
  Filled 2021-10-14: qty 2

## 2021-10-14 MED ORDER — ACETAMINOPHEN 325 MG PO TABS
650.0000 mg | ORAL_TABLET | Freq: Once | ORAL | Status: AC
  Administered 2021-10-14: 15:00:00 650 mg via ORAL
  Filled 2021-10-14: qty 2

## 2021-10-14 MED ORDER — INSULIN ASPART 100 UNIT/ML IJ SOLN
10.0000 [IU] | Freq: Once | INTRAMUSCULAR | Status: AC
  Administered 2021-10-14: 14:00:00 10 [IU] via SUBCUTANEOUS
  Filled 2021-10-14: qty 0.1

## 2021-10-14 MED ORDER — ONDANSETRON 8 MG PO TBDP
8.0000 mg | ORAL_TABLET | Freq: Three times a day (TID) | ORAL | 0 refills | Status: DC | PRN
Start: 2021-10-14 — End: 2021-11-06
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
## 2021-10-14 NOTE — MAU Note (Signed)
Pt states pain has improved, nausea got better but now is has returned. K[place]ra CNM made aware.

## 2021-10-14 NOTE — MAU Note (Signed)
...  Joann Meyers is a 35 y.o. at [redacted]w[redacted]d here in MAU reporting: Dizziness and fatigue since 1130 this morning while at work. She is also endorsing intermittent nausea for one week and states she has had a hard time keeping food down consistently. She last vomited at 1100 and 1130 this morning. Denies having any medication for nausea. Also endorsing intermittent lower abdominal pain that has been ongoing for 5 days with white discharge that smells fishy.  Last meal this morning at 1030.  Pain score:  7/10 lower abdomen  FHT: 151 doppler Lab orders placed from triage: UA

## 2021-10-20 ENCOUNTER — Encounter: Payer: Self-pay | Admitting: Family Medicine

## 2021-10-20 DIAGNOSIS — Z7189 Other specified counseling: Secondary | ICD-10-CM | POA: Insufficient documentation

## 2021-10-21 ENCOUNTER — Telehealth: Payer: Self-pay | Admitting: Genetics

## 2021-10-21 NOTE — Telephone Encounter (Signed)
Called Joann Meyers to return results from her Non-Invasive Prenatal Screening. Connee stated she is at work and cannot talk on the phone at this time. She requested that genetic counseling call her on Monday (10/24/21) after 1pm.

## 2021-10-24 ENCOUNTER — Telehealth: Payer: Self-pay | Admitting: Genetics

## 2021-10-24 NOTE — Telephone Encounter (Signed)
Follow-up call to [facility]. The following was discussed:  The NIPS which Nubia opted to have re-drawn (due to an initial low fetal fraction) in our office on 10/07/21 resulted high risk again due to low fetal fraction (2.6%). Fetal fraction is an important quality control metric. The laboratory reports there was insufficient fetal DNA in the sample to obtain a reliable result using standard NIPS methods. Therefore, an additional proprietary analysis was performed, incorporating fetal fraction, maternal age, maternal weight, and gestational age. Based upon the results of the additional analysis, the pregnancy is considered to have a high risk (1 in 17 chance) for Triploidy, Trisomy 18, and Trisomy 13. The risks for Trisomy 21 and Monosomy X are not changed. Given this result, Haniyah has the option to complete diagnostic testing via amniocentesis. We also discussed that Johnsie Cancel will accept one more repeat NIPS blood sample (total of three). Anola also has the option to decline amniocentesis and decline a third NIPS and have ultrasounds only. Nikoletta expressed that she is not interested in amniocentesis and would like to have her blood drawn for a third time at her next [facility] on 11/10/21.  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 Genetic counseling recommended carrier screening for Samona's partner. Kiera stated that her partner is currently living in [place] and is not available for screening. Genetic counseling offered to work with her partner's routine healthcare providers to facilitate screening for him in  [place]. Briana declined screening on his behalf at this time but states that she will discuss it with her partner further. Genetic counseling instructed Karalina to contact genetic counseling as soon as possible if her partner decides he wants to complete this screening in [place].   All questions answered.

## 2021-10-25 ENCOUNTER — Other Ambulatory Visit: Payer: Self-pay

## 2021-10-27 ENCOUNTER — Telehealth: Payer: Self-pay | Admitting: Lactation Services

## 2021-10-27 NOTE — Telephone Encounter (Signed)
Received PA for Novolin N Insulin.   Called pharmacy and patient has not picked up Insulin.    Per Medicaid covered medications, Humulin N is covered by Medicaid. Per patient chart she is allergic to shellfish and cannot be prescribed Humulin N. Will complete PA.

## 2021-11-06 ENCOUNTER — Other Ambulatory Visit: Payer: Self-pay

## 2021-11-06 ENCOUNTER — Inpatient Hospital Stay (HOSPITAL_COMMUNITY)
Admission: AD | Admit: 2021-11-06 | Discharge: 2021-11-06 | Disposition: A | Discharge status: Home / Self Care | Payer: Medicaid Other | Attending: Family Medicine | Admitting: Family Medicine

## 2021-11-06 ENCOUNTER — Encounter (HOSPITAL_COMMUNITY): Payer: Self-pay | Admitting: Family Medicine

## 2021-11-06 DIAGNOSIS — M25552 Pain in left hip: Secondary | ICD-10-CM | POA: Insufficient documentation

## 2021-11-06 DIAGNOSIS — O219 Vomiting of pregnancy, unspecified: Secondary | ICD-10-CM | POA: Insufficient documentation

## 2021-11-06 DIAGNOSIS — O26892 Other specified pregnancy related conditions, second trimester: Secondary | ICD-10-CM | POA: Insufficient documentation

## 2021-11-06 DIAGNOSIS — O209 Hemorrhage in early pregnancy, unspecified: Secondary | ICD-10-CM | POA: Insufficient documentation

## 2021-11-06 DIAGNOSIS — Z7984 Long term (current) use of oral hypoglycemic drugs: Secondary | ICD-10-CM | POA: Diagnosis not present

## 2021-11-06 DIAGNOSIS — Z794 Long term (current) use of insulin: Secondary | ICD-10-CM | POA: Insufficient documentation

## 2021-11-06 DIAGNOSIS — Z9114 Patient's other noncompliance with medication regimen: Secondary | ICD-10-CM | POA: Diagnosis not present

## 2021-11-06 DIAGNOSIS — R109 Unspecified abdominal pain: Secondary | ICD-10-CM | POA: Diagnosis not present

## 2021-11-06 DIAGNOSIS — Z833 Family history of diabetes mellitus: Secondary | ICD-10-CM | POA: Insufficient documentation

## 2021-11-06 DIAGNOSIS — O24112 Pre-existing diabetes mellitus, type 2, in pregnancy, second trimester: Secondary | ICD-10-CM | POA: Insufficient documentation

## 2021-11-06 DIAGNOSIS — Z3A18 18 weeks gestation of pregnancy: Secondary | ICD-10-CM | POA: Insufficient documentation

## 2021-11-06 DIAGNOSIS — O24319 Unspecified pre-existing diabetes mellitus in pregnancy, unspecified trimester: Secondary | ICD-10-CM

## 2021-11-06 DIAGNOSIS — M79606 Pain in leg, unspecified: Secondary | ICD-10-CM

## 2021-11-06 DIAGNOSIS — M79605 Pain in left leg: Secondary | ICD-10-CM | POA: Insufficient documentation

## 2021-11-06 LAB — URINALYSIS, ROUTINE W REFLEX MICROSCOPIC
Bilirubin Urine: NEGATIVE
Glucose, UA: >=500 mg/dL — AB
Hgb urine dipstick: NEGATIVE
Ketones, ur: NEGATIVE mg/dL
Nitrite: NEGATIVE
Protein, ur: 100 mg/dL — AB
Specific Gravity, Urine: 1.015 (ref 1.005–1.030)
pH: 5 (ref 5.0–8.0)

## 2021-11-06 LAB — CBC
HCT: 25.9 % — ABNORMAL LOW (ref 36.0–46.0)
Hemoglobin: 8.6 g/dL — ABNORMAL LOW (ref 12.0–15.0)
MCH: 27.3 pg (ref 26.0–34.0)
MCHC: 33.2 g/dL (ref 30.0–36.0)
MCV: 82.2 fL (ref 80.0–100.0)
Platelets: 139 10*3/uL — ABNORMAL LOW (ref 150–400)
RBC: 3.15 MIL/uL — ABNORMAL LOW (ref 3.87–5.11)
RDW: 17.2 % — ABNORMAL HIGH (ref 11.5–15.5)
WBC: 7 10*3/uL (ref 4.0–10.5)
nRBC: 0 % (ref 0.0–0.2)

## 2021-11-06 LAB — GLUCOSE, CAPILLARY: Glucose-Capillary: 162 mg/dL — ABNORMAL HIGH (ref 70–99)

## 2021-11-06 LAB — COMPREHENSIVE METABOLIC PANEL
ALT: 14 U/L (ref 0–44)
AST: 17 U/L (ref 15–41)
Albumin: 2.9 g/dL — ABNORMAL LOW (ref 3.5–5.0)
Alkaline Phosphatase: 45 U/L (ref 38–126)
Anion gap: 6 (ref 5–15)
BUN: 12 mg/dL (ref 6–20)
CO2: 21 mmol/L — ABNORMAL LOW (ref 22–32)
Calcium: 8.9 mg/dL (ref 8.9–10.3)
Chloride: 106 mmol/L (ref 98–111)
Creatinine, Ser: 0.52 mg/dL (ref 0.44–1.00)
GFR, Estimated: >60 mL/min (ref >60)
Glucose, Bld: 189 mg/dL — ABNORMAL HIGH (ref 70–99)
Potassium: 3.6 mmol/L (ref 3.5–5.1)
Sodium: 133 mmol/L — ABNORMAL LOW (ref 135–145)
Total Bilirubin: 0.6 mg/dL (ref 0.3–1.2)
Total Protein: 6 g/dL — ABNORMAL LOW (ref 6.5–8.1)

## 2021-11-06 MED ORDER — SODIUM CHLORIDE 0.9 % IV SOLN: INTRAVENOUS | Status: DC

## 2021-11-06 MED ORDER — ONDANSETRON 4 MG PO TBDP: 4.0000 mg | ORAL_TABLET | Freq: Three times a day (TID) | ORAL | 0 refills | Status: DC | PRN

## 2021-11-06 MED ORDER — "INSULIN SYRINGE/NEEDLE 28G X 1/2"" 0.5 ML MISC": 1.0000 [IU] | Freq: Three times a day (TID) | 1 refills | Status: DC

## 2021-11-06 MED ORDER — ONDANSETRON 4 MG PO TBDP
4.0000 mg | ORAL_TABLET | Freq: Once | ORAL | Status: AC
Start: 2021-11-06 — End: 2021-11-06
  Administered 2021-11-06: 4 mg via ORAL
  Filled 2021-11-06: qty 1

## 2021-11-06 MED ORDER — ACETAMINOPHEN 325 MG PO TABS
650.0000 mg | ORAL_TABLET | Freq: Once | ORAL | Status: AC
  Administered 2021-11-06: 650 mg via ORAL
  Filled 2021-11-06: qty 2

## 2021-11-06 NOTE — MAU Note (Signed)
Printed AVS given to patient and patient signed form since signature pad was not responding in room.  Pt verbalized understanding of discharge instructions and went home in good condition.

## 2021-11-06 NOTE — MAU Note (Addendum)
...  Joann Meyers is a 36 y.o. at [redacted]w[redacted]d here in MAU reporting: Vaginal spotting this past Friday and Saturday, but none today. She states she has also been experiencing intermittent abdominal pain around her belly button for one week now and intermittent left leg pain from her hip down to her knee for two weeks. She states at rest her abdominal pain is an 8/10 but when she walks it is a 10/10. No LOF.   She has not taken any of her medications for one week. She states something she has been taking has made her not feel well and shakey. She is supposed to take insulin, iron, metformin, zofran, aspirin, and labetalol. Has not had any insulin since her last visit to MAU on 10/14/2021.   She states her BP's have been high "160's to 210's for the top number."  Pain scores: 8/10 abdominal pain  10/10 left leg pain   FHT: 150 doppler Lab orders placed from triage: UA
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
## 2021-11-07 ENCOUNTER — Other Ambulatory Visit: Payer: Self-pay

## 2021-11-07 MED ORDER — INSULIN SYRINGE 31G X 5/16" 0.5 ML MISC
1.0000 [IU] | Freq: Three times a day (TID) | 1 refills | Status: DC
Start: 2021-11-07 — End: 2021-11-15

## 2021-11-07 NOTE — Progress Notes (Signed)
Call received from pharmacy stating does not have 28 insulin syringe, needing 31G . New Rx sent to pharmacy on file.  Shelda Pal

## 2021-11-08 ENCOUNTER — Other Ambulatory Visit: Payer: Self-pay

## 2021-11-08 ENCOUNTER — Ambulatory Visit (HOSPITAL_COMMUNITY)
Admission: EM | Admit: 2021-11-08 | Discharge: 2021-11-08 | Disposition: A | Discharge status: Home / Self Care | Payer: Medicaid Other | Attending: Emergency Medicine | Admitting: Emergency Medicine

## 2021-11-08 ENCOUNTER — Telehealth: Payer: Self-pay | Admitting: Registered"

## 2021-11-08 ENCOUNTER — Encounter (HOSPITAL_COMMUNITY): Payer: Self-pay

## 2021-11-08 DIAGNOSIS — L249 Irritant contact dermatitis, unspecified cause: Secondary | ICD-10-CM

## 2021-11-08 DIAGNOSIS — G5603 Carpal tunnel syndrome, bilateral upper limbs: Secondary | ICD-10-CM | POA: Diagnosis not present

## 2021-11-08 MED ORDER — TRIAMCINOLONE ACETONIDE 0.025 % EX OINT: 1.0000 "application " | TOPICAL_OINTMENT | Freq: Two times a day (BID) | CUTANEOUS | 0 refills | Status: DC

## 2021-11-08 NOTE — Telephone Encounter (Signed)
Returned call from [place] from Care Management, High Risk pregnancy regarding message that Pt is not taking insulin x1 week, pt reports makes her shaky. Earnest Bailey states she told the patient she has to get her diabetes supplies and check her blood sugar, and concerned that this patient will not be see for diabetes appointment for 2 weeks. RD is not able to see her until her scheduled visit and will reach out to the clinical staff to see if someone else is able to reach out to the patient before her diabetes education follow-up visit.  At Patient's last visit with educator on 09/21/21 Dexcom CGM and Omnipod DASH insulin pump options were discussed. Patient did not return for follow-up visit to discuss next steps and not sure if she is interested in going that route.
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
## 2021-11-08 NOTE — ED Triage Notes (Signed)
Pt presents with c/o headache and bilateral hand pain X 5 days.

## 2021-11-08 NOTE — Discharge Instructions (Addendum)
Wear the cock-up splints at all times for the next several weeks, then at night.  May take 1000 mg of Tylenol 3-4 times a day as needed for pain.  Wash your hands with lukewarm, not hot water.  Apply several-day or Vaseline after washing your hands to moisturize them.  You can try the triamcinolone ointment, Claritin or Zyrtec for itching.  Return for any signs of infection peer

## 2021-11-09 ENCOUNTER — Encounter (HOSPITAL_COMMUNITY): Payer: Self-pay | Admitting: Obstetrics and Gynecology

## 2021-11-09 ENCOUNTER — Encounter: Payer: Self-pay | Admitting: Cardiology

## 2021-11-09 ENCOUNTER — Inpatient Hospital Stay (HOSPITAL_COMMUNITY)
Admission: AD | Admit: 2021-11-09 | Discharge: 2021-11-09 | Disposition: A | Discharge status: Home / Self Care | Payer: Medicaid Other | Attending: Obstetrics and Gynecology | Admitting: Obstetrics and Gynecology

## 2021-11-09 ENCOUNTER — Ambulatory Visit (INDEPENDENT_AMBULATORY_CARE_PROVIDER_SITE_OTHER): Payer: Medicaid Other | Admitting: Cardiology

## 2021-11-09 ENCOUNTER — Inpatient Hospital Stay (HOSPITAL_BASED_OUTPATIENT_CLINIC_OR_DEPARTMENT_OTHER): Payer: Medicaid Other

## 2021-11-09 VITALS — BP 148/70 | HR 79 | Ht 62.0 in | Wt 211.4 lb

## 2021-11-09 DIAGNOSIS — R102 Pelvic and perineal pain: Secondary | ICD-10-CM | POA: Diagnosis not present

## 2021-11-09 DIAGNOSIS — R519 Headache, unspecified: Secondary | ICD-10-CM | POA: Diagnosis not present

## 2021-11-09 DIAGNOSIS — R569 Unspecified convulsions: Secondary | ICD-10-CM | POA: Diagnosis not present

## 2021-11-09 DIAGNOSIS — Z3A18 18 weeks gestation of pregnancy: Secondary | ICD-10-CM

## 2021-11-09 DIAGNOSIS — O26892 Other specified pregnancy related conditions, second trimester: Secondary | ICD-10-CM | POA: Insufficient documentation

## 2021-11-09 DIAGNOSIS — D649 Anemia, unspecified: Secondary | ICD-10-CM | POA: Diagnosis not present

## 2021-11-09 DIAGNOSIS — Z6790 Unspecified blood type, Rh positive: Secondary | ICD-10-CM

## 2021-11-09 DIAGNOSIS — Z8759 Personal history of other complications of pregnancy, childbirth and the puerperium: Secondary | ICD-10-CM | POA: Diagnosis not present

## 2021-11-09 DIAGNOSIS — O4402 Placenta previa specified as without hemorrhage, second trimester: Secondary | ICD-10-CM | POA: Insufficient documentation

## 2021-11-09 DIAGNOSIS — O42912 Preterm premature rupture of membranes, unspecified as to length of time between rupture and onset of labor, second trimester: Secondary | ICD-10-CM | POA: Insufficient documentation

## 2021-11-09 DIAGNOSIS — Z8673 Personal history of transient ischemic attack (TIA), and cerebral infarction without residual deficits: Secondary | ICD-10-CM

## 2021-11-09 DIAGNOSIS — E119 Type 2 diabetes mellitus without complications: Secondary | ICD-10-CM | POA: Insufficient documentation

## 2021-11-09 DIAGNOSIS — O4400 Placenta previa specified as without hemorrhage, unspecified trimester: Secondary | ICD-10-CM

## 2021-11-09 DIAGNOSIS — O4290 Premature rupture of membranes, unspecified as to length of time between rupture and onset of labor, unspecified weeks of gestation: Secondary | ICD-10-CM

## 2021-11-09 DIAGNOSIS — O99210 Obesity complicating pregnancy, unspecified trimester: Secondary | ICD-10-CM

## 2021-11-09 DIAGNOSIS — R051 Acute cough: Secondary | ICD-10-CM | POA: Insufficient documentation

## 2021-11-09 DIAGNOSIS — I7771 Dissection of carotid artery: Secondary | ICD-10-CM

## 2021-11-09 DIAGNOSIS — Z0371 Encounter for suspected problem with amniotic cavity and membrane ruled out: Secondary | ICD-10-CM | POA: Diagnosis not present

## 2021-11-09 DIAGNOSIS — O99012 Anemia complicating pregnancy, second trimester: Secondary | ICD-10-CM | POA: Diagnosis not present

## 2021-11-09 DIAGNOSIS — O24112 Pre-existing diabetes mellitus, type 2, in pregnancy, second trimester: Secondary | ICD-10-CM | POA: Insufficient documentation

## 2021-11-09 DIAGNOSIS — O10919 Unspecified pre-existing hypertension complicating pregnancy, unspecified trimester: Secondary | ICD-10-CM | POA: Diagnosis not present

## 2021-11-09 DIAGNOSIS — O42919 Preterm premature rupture of membranes, unspecified as to length of time between rupture and onset of labor, unspecified trimester: Secondary | ICD-10-CM | POA: Diagnosis not present

## 2021-11-09 DIAGNOSIS — Z20822 Contact with and (suspected) exposure to covid-19: Secondary | ICD-10-CM | POA: Insufficient documentation

## 2021-11-09 DIAGNOSIS — O99352 Diseases of the nervous system complicating pregnancy, second trimester: Secondary | ICD-10-CM | POA: Insufficient documentation

## 2021-11-09 DIAGNOSIS — O09299 Supervision of pregnancy with other poor reproductive or obstetric history, unspecified trimester: Secondary | ICD-10-CM

## 2021-11-09 DIAGNOSIS — O10912 Unspecified pre-existing hypertension complicating pregnancy, second trimester: Secondary | ICD-10-CM | POA: Diagnosis not present

## 2021-11-09 LAB — RESP PANEL BY RT-PCR (FLU A&B, COVID) ARPGX2
Influenza A by PCR: NEGATIVE
Influenza B by PCR: NEGATIVE
SARS Coronavirus 2 by RT PCR: NEGATIVE

## 2021-11-09 LAB — AMNISURE RUPTURE OF MEMBRANE (ROM) NOT AT ARMC: Amnisure ROM: POSITIVE

## 2021-11-09 LAB — GLUCOSE, CAPILLARY: Glucose-Capillary: 134 mg/dL — ABNORMAL HIGH (ref 70–99)

## 2021-11-09 LAB — WET PREP, GENITAL
Clue Cells Wet Prep HPF POC: NONE SEEN
Sperm: NONE SEEN
Trich, Wet Prep: NONE SEEN
WBC, Wet Prep HPF POC: >=10 — AB (ref <10)
Yeast Wet Prep HPF POC: NONE SEEN

## 2021-11-09 MED ORDER — NIFEDIPINE ER OSMOTIC RELEASE 30 MG PO TB24: 30.0000 mg | ORAL_TABLET | Freq: Every day | ORAL | 3 refills | Status: DC

## 2021-11-09 NOTE — Patient Instructions (Addendum)
Medication Instructions:  Your physician has recommended you make the following change in your medication:  START: Nifedipine (procardia) 30 mg once daily *If you need a refill on your cardiac medications before your next appointment, please call your pharmacy*   Lab Work: None If you have labs (blood work) drawn today and your tests are completely normal, you will receive your results only by: [facility] (if you have MyChart) OR A paper copy in the mail If you have any lab test that is abnormal or we need to change your treatment, we will call you to review the results.   Testing/Procedures: None   Follow-Up: At [facility], you and your health needs are our priority.  As part of our continuing mission to provide you with exceptional heart care, we have created designated Provider Care Teams.  These Care Teams include your primary Cardiologist (physician) and Advanced Practice Providers (APPs -  Physician Assistants and Nurse Practitioners) who all work together to provide you with the care you need, when you need it.  We recommend signing up for the patient portal called "MyChart".  Sign up information is provided on this After Visit Summary.  MyChart is used to connect with patients for Virtual Visits (Telemedicine).  Patients are able to view lab/test results, encounter notes, upcoming appointments, etc.  Non-urgent messages can be sent to your provider as well.   To learn more about what you can do with MyChart, go to [email or web address].    Your next appointment:   8 week(s)  The format for your next appointment:   In Person  Provider:   Berniece Salines, DO     Other Instructions
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
## 2021-11-09 NOTE — Discharge Instructions (Signed)

## 2021-11-09 NOTE — Telephone Encounter (Signed)
Called pt to follow up. Call cannot be completed at this time. Called pt contact; call cannot be completed. Sent MyChart message.

## 2021-11-09 NOTE — MAU Note (Signed)
Patient arrived to MAU from home by EMS complaining of lower abdominal pain, and leakage of clear fluid that started at 1600 today. Patient of headache that started at 3am after she vomited, which she is rating 10/10. She stated that she took tylenol at [facility] and has not gotten any relief.  RN noticed patient has a heavy cough. Patient stated that she has had the cough for 4 days.
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
## 2021-11-10 ENCOUNTER — Ambulatory Visit: Payer: Medicaid Other | Attending: Obstetrics | Admitting: Obstetrics

## 2021-11-10 ENCOUNTER — Other Ambulatory Visit: Payer: Self-pay

## 2021-11-10 ENCOUNTER — Ambulatory Visit: Payer: Medicaid Other

## 2021-11-10 ENCOUNTER — Ambulatory Visit: Payer: Medicaid Other | Attending: Obstetrics & Gynecology

## 2021-11-10 ENCOUNTER — Other Ambulatory Visit: Payer: Self-pay | Admitting: *Deleted

## 2021-11-10 ENCOUNTER — Ambulatory Visit: Payer: Medicaid Other | Admitting: *Deleted

## 2021-11-10 VITALS — BP 132/66 | HR 92

## 2021-11-10 DIAGNOSIS — O09299 Supervision of pregnancy with other poor reproductive or obstetric history, unspecified trimester: Secondary | ICD-10-CM | POA: Insufficient documentation

## 2021-11-10 DIAGNOSIS — O42112 Preterm premature rupture of membranes, onset of labor more than 24 hours following rupture, second trimester: Secondary | ICD-10-CM

## 2021-11-10 DIAGNOSIS — O4402 Placenta previa specified as without hemorrhage, second trimester: Secondary | ICD-10-CM | POA: Diagnosis present

## 2021-11-10 DIAGNOSIS — Z3A19 19 weeks gestation of pregnancy: Secondary | ICD-10-CM | POA: Diagnosis not present

## 2021-11-10 DIAGNOSIS — O1210 Gestational proteinuria, unspecified trimester: Secondary | ICD-10-CM

## 2021-11-10 DIAGNOSIS — O34219 Maternal care for unspecified type scar from previous cesarean delivery: Secondary | ICD-10-CM | POA: Diagnosis present

## 2021-11-10 DIAGNOSIS — O42919 Preterm premature rupture of membranes, unspecified as to length of time between rupture and onset of labor, unspecified trimester: Secondary | ICD-10-CM

## 2021-11-10 DIAGNOSIS — O0990 Supervision of high risk pregnancy, unspecified, unspecified trimester: Secondary | ICD-10-CM | POA: Insufficient documentation

## 2021-11-10 DIAGNOSIS — O99212 Obesity complicating pregnancy, second trimester: Secondary | ICD-10-CM

## 2021-11-10 DIAGNOSIS — O280 Abnormal hematological finding on antenatal screening of mother: Secondary | ICD-10-CM

## 2021-11-10 NOTE — Progress Notes (Signed)
Pt states she felt a bubble pop in her vagina yesterday and clear liquid started coming out and soaked her underware and jeans.  She changed clothes 7 times as it continued to leak. States it is still leaking today. Went to MAU and they confirmed +ROM.

## 2021-11-10 NOTE — Progress Notes (Signed)
MFM Note  Joann Meyers was seen for a detailed fetal anatomy scan due to probable PPROM that occurred yesterday.  The patient presented to the MAU yesterday after she felt leakage of fluid that saturated her underwear and pants while she was shopping.  The Amnisure test in the MAU was positive.  The patient reports that she continues to leak a small amount of fluid today.  She also has a extremely complicated past medical history with multiple medical issues.  Her cell free DNA test indicated a high risk for triploidy, trisomy 74 or 13 due to low fetal fraction.  The patient has already met with our genetic counselor regarding this finding.  She was informed that the fetal growth was appropriate for her gestational age.  Although subjectively low, there still appears to be a normal amount of amniotic fluid.  The views of the fetal anatomy were limited today due to maternal body habitus and the fetal position.  The patient was informed that anomalies may be missed due to technical limitations. If the fetus is in a suboptimal position or maternal habitus is increased, visualization of the fetus in the maternal uterus may be impaired.  A posterior placenta previa is noted today. We will continue to assess this finding during her future exams.   Due to PPROM at 19 weeks, she was advised that there is a high likelihood that this will not be a successful pregnancy.  She was advised that she may be at increased risk for spontaneous preterm labor, an IUFD, an intrauterine infection, and fetal pulmonary hypoplasia should the amniotic fluid continue to decrease. Viability is currently considered at around 23 weeks.   Management options for PPROM at such an early gestational age were reviewed today.  The patient has stated that she would like to do everything possible to help her achieve the best pregnancy outcome.  The patient was advised that we will continue to follow her with weekly ultrasound exams  for fetal assessment and for assessment of the amniotic fluid.  Should she remain undelivered with a viable fetus at 22 weeks and 5 days without any signs of an intrauterine infection, a course of antenatal corticosteroids can be administered at that time and consideration may be given to continued inpatient management until delivery.  The patient was advised to continue to monitor for signs and symptoms of an intrauterine infection such as fevers, shaking chills, and a foul-smelling discharge.  She understands to that she should go to the hospital should she have any signs or symptoms of an intrauterine infection or signs of labor.  The patient was advised that as there is still a normal amount of amniotic fluid, hopefully we can help her obtain a good pregnancy outcome.  However, she understands that usually women with PPROM will continue to leak amniotic fluid which may contribute to a poor obstetrical outcome.  She was given a note to be taken out of work for the time being.    She will return in 1 week for another ultrasound exam for fetal assessment.  The patient stated that all of her questions have been answered today.  A total of 45 minutes was spent counseling and coordinating the care for this patient.  Greater than 50% of the time was spent in direct face-to-face contact.

## 2021-11-11 LAB — GC/CHLAMYDIA PROBE AMP (~~LOC~~) NOT AT ARMC
Chlamydia: NEGATIVE
Comment: NEGATIVE
Comment: NORMAL
Neisseria Gonorrhea: NEGATIVE

## 2021-11-13 ENCOUNTER — Telehealth: Payer: Self-pay | Admitting: Radiology

## 2021-11-13 NOTE — Telephone Encounter (Signed)
Tried calling patient to inform her of appt scheduled for 11/15/21 @ 9:55 with Dr Ilda Basset. Unable to leave voicemail. Sent mychart message with appointment information.

## 2021-11-14 ENCOUNTER — Encounter: Payer: Self-pay | Admitting: Obstetrics & Gynecology

## 2021-11-14 DIAGNOSIS — O42112 Preterm premature rupture of membranes, onset of labor more than 24 hours following rupture, second trimester: Secondary | ICD-10-CM | POA: Insufficient documentation

## 2021-11-14 DIAGNOSIS — O42919 Preterm premature rupture of membranes, unspecified as to length of time between rupture and onset of labor, unspecified trimester: Secondary | ICD-10-CM | POA: Insufficient documentation

## 2021-11-15 ENCOUNTER — Other Ambulatory Visit: Payer: Self-pay

## 2021-11-15 ENCOUNTER — Ambulatory Visit (INDEPENDENT_AMBULATORY_CARE_PROVIDER_SITE_OTHER): Payer: Medicaid Other | Admitting: Obstetrics and Gynecology

## 2021-11-15 VITALS — BP 139/78 | HR 91 | Temp 98.1°F | Wt 207.5 lb

## 2021-11-15 DIAGNOSIS — O99210 Obesity complicating pregnancy, unspecified trimester: Secondary | ICD-10-CM

## 2021-11-15 DIAGNOSIS — Z6838 Body mass index (BMI) 38.0-38.9, adult: Secondary | ICD-10-CM

## 2021-11-15 DIAGNOSIS — O42112 Preterm premature rupture of membranes, onset of labor more than 24 hours following rupture, second trimester: Secondary | ICD-10-CM

## 2021-11-15 DIAGNOSIS — O34219 Maternal care for unspecified type scar from previous cesarean delivery: Secondary | ICD-10-CM

## 2021-11-15 DIAGNOSIS — O24319 Unspecified pre-existing diabetes mellitus in pregnancy, unspecified trimester: Secondary | ICD-10-CM

## 2021-11-15 DIAGNOSIS — D696 Thrombocytopenia, unspecified: Secondary | ICD-10-CM

## 2021-11-15 DIAGNOSIS — O4402 Placenta previa specified as without hemorrhage, second trimester: Secondary | ICD-10-CM

## 2021-11-15 DIAGNOSIS — I7771 Dissection of carotid artery: Secondary | ICD-10-CM

## 2021-11-15 DIAGNOSIS — O10919 Unspecified pre-existing hypertension complicating pregnancy, unspecified trimester: Secondary | ICD-10-CM

## 2021-11-15 DIAGNOSIS — O285 Abnormal chromosomal and genetic finding on antenatal screening of mother: Secondary | ICD-10-CM | POA: Insufficient documentation

## 2021-11-15 DIAGNOSIS — E119 Type 2 diabetes mellitus without complications: Secondary | ICD-10-CM

## 2021-11-15 DIAGNOSIS — Z8673 Personal history of transient ischemic attack (TIA), and cerebral infarction without residual deficits: Secondary | ICD-10-CM

## 2021-11-15 DIAGNOSIS — Z3A19 19 weeks gestation of pregnancy: Secondary | ICD-10-CM

## 2021-11-15 DIAGNOSIS — O1210 Gestational proteinuria, unspecified trimester: Secondary | ICD-10-CM

## 2021-11-15 DIAGNOSIS — R053 Chronic cough: Secondary | ICD-10-CM

## 2021-11-15 DIAGNOSIS — O99112 Other diseases of the blood and blood-forming organs and certain disorders involving the immune mechanism complicating pregnancy, second trimester: Secondary | ICD-10-CM

## 2021-11-15 MED ORDER — BENZONATATE 100 MG PO CAPS: 200.0000 mg | ORAL_CAPSULE | Freq: Two times a day (BID) | ORAL | 0 refills | Status: DC | PRN

## 2021-11-15 MED ORDER — "INSULIN SYRINGE 31G X 5/16"" 0.5 ML MISC": 1.0000 [IU] | Freq: Three times a day (TID) | 1 refills | Status: DC

## 2021-11-15 MED ORDER — GLUCOSE BLOOD VI STRP: 1.0000 | ORAL_STRIP | Freq: Four times a day (QID) | 12 refills | Status: DC

## 2021-11-15 MED ORDER — FAMOTIDINE 20 MG PO TABS: 20.0000 mg | ORAL_TABLET | Freq: Two times a day (BID) | ORAL | 3 refills | Status: DC

## 2021-11-15 NOTE — Progress Notes (Signed)
PRENATAL VISIT NOTE  Subjective:  Joann Meyers is a 36 y.o. G2P0101 at [redacted]w[redacted]d being seen today for ongoing prenatal care.  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  Patient reports no complaints.  Contractions: Not present. Vag. Bleeding: None.  Movement: Absent. Denies leaking of fluid.   The following portions of the patient's history were reviewed and updated as appropriate: allergies, current medications, past family history, past medical history, past social history, past surgical history and problem list.   Objective:   Vitals:   11/15/21 1020  BP: 139/78  Pulse: 91  Temp: 98.1 F (36.7 C)  Weight: 207 lb 8 oz (94.1 kg)    Fetal Status: Fetal Heart Rate (bpm): 156   Movement: Absent     General:  Alert, oriented and cooperative. Patient is in no acute distress.  Skin: Skin is warm and dry. No rash noted.   Cardiovascular: Normal heart rate noted, normal s1 and s2, no MRGs   Respiratory: Ctab, Normal respiratory effort, no problems with respiration noted  Abdomen: Soft, gravid, appropriate for gestational age.  Pain/Pressure: Absent     Pelvic: Cervical exam deferred        Extremities: Normal range of motion.  Edema: None  Mental Status: Normal mood and affect. Normal behavior. Normal judgment and thought content.   Assessment and Plan:  Pregnancy: G2P0101 at [redacted]w[redacted]d 1. [redacted] weeks gestation of pregnancy Patient confirms on aspirin  I d/w Dr. Annamaria Boots and he will determine next week if there is enough amniotic fluid around the baby to make a fetal echo worthwhile.   2. Chronic hypertension affecting pregnancy Pt followed by [facility] Dr. Harriet Masson, last visit 1/25 with plan for 8wk f/u.Marland Kitchen She confirms she is taking labetalol 300 bid and procardia xl 30 qday. Continue with current regimen.   3. Preexisting diabetes complicating pregnancy, antepartum Patient states she is on metformin 1000 with breakfast and 1000 with dinner. She states she is not taking the insulin b/c she doesn't have the needles b/c the pharmacy didn't get the script; she states she does have the insulin and syringes; she states she needs a lancet. Paper Rx for needles and lancets given to patient and I told her if there are any issues with this to please let us know.  Per the chart, she was given aspart 5U tidac as her only insulin and she confirms this. She states her AM fastings are low 100s and she states when she checks her sugars her AM fastings are in the low 100s and she is checking before lunch and after  dinner. Before lunch they are in the 130s and after dinner it is in the 300s.  For now, I told her to just do the 5U aspart with meals but  she'll need to be on higher but we have to start some where and I asked her to do 2 hour post prandials  F/u on Friday for CBG check  Patient has 2/6 DM education visit  4. Preterm premature rupture of membranes in second trimester At 18/6. I gave her MAU  precautions (chorio, PTL, VB, etc) and plan is qwk HROB and MFM AFI visits, steroids at 22/5 and 22/6 and admit to antepartum at 23/0 1/26: 20%, 243gm, ac 21%, afi wnl (5), POSTERIOR PREVIA, anatomy incomplete, cervix 3cm   Follow up 2/3 MFM visit  5. Placenta previa in second trimester Pelvic rest recommended. MAU precautions given  6. 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HCC) Keep BP at goal.  No follow up with Neurology or Vascular. Was placed on BASA last pregnancy, no need for anticoagulation then. From 04/06/2020 Vascular Dr. Donnetta Hutching: Had long discussion with the patient.  I explained this is a stable situation and has been documented for over 3 years.  She does not require any specific therapy for this and explained that she has proven that she has adequate cross-filling from left to right.  Would not recommend any further imaging studies or follow-up unless she develops new events.  7. Obesity in pregnancy, antepartum  8. BMI 38.0-38.9,adult  9. History of TIA (transient ischemic attack) and stroke No follow up with Neurology or Vascular. Was placed on BASA last pregnancy, no need for anticoagulation then  10. Abnormal genetic test during pregnancy High risk for T13/T18 on panorama; pt seen by genetic counseling and decline amnio. See above  11. Previous cesarean delivery, antepartum  12. Proteinuria affecting pregnancy, antepartum At her new OB  13. Type 2 diabetes mellitus without complication, without long-term current use of insulin ([place]) See above.  - glucose blood test strip; 1 each by Other route 4 (four) times daily. Morning fasting, 2 hours after each meal  Dispense: 100 each; Refill: 12  14. Benign gestational thrombocytopenia in second trimester [facility]) Stable CBC Latest Ref Rng & Units 11/06/2021 10/14/2021 09/15/2021  WBC 4.0 - 10.5 K/uL 7.0 5.9 5.6  Hemoglobin 12.0 - 15.0 g/dL 8.6(L) 8.5(L) 10.4(L)  Hematocrit 36.0 - 46.0 % 25.9(L) 25.9(L) 31.9(L)  Platelets 150 - 400 K/uL  139(L) 130(L) 169    15. Chronic cough Neg covid on 1/25. Cough is dry. Tessalon perles given.   16. History of severe pre-eclampsia  Preterm labor symptoms and general obstetric precautions including but not limited to vaginal bleeding, contractions, leaking of fluid and fetal movement were reviewed in detail with the patient. Please refer to After Visit Summary for other counseling recommendations.   Return in 3 days (on 11/18/2021) for in person, md visit.CBG check  Future Appointments  Date Time Provider [place]  11/18/2021  8:15 AM Clarnce Flock, MD [facility]  11/18/2021  2:15 PM [facility] NURSE [facility]  11/18/2021  2:30 PM [facility] US3 [facility]  11/21/2021  4:45 PM Christella Hartigan, RD [facility]  11/24/2021  2:15 PM [facility] NURSE [facility]  11/24/2021  2:30 PM [facility] US3 [facility]  12/01/2021  1:00 PM [facility] NURSE [facility]  12/01/2021  1:15 PM [facility] US2 [facility]  01/04/2022  4:00 PM Tobb, Kardie, DO [facility]    Aletha Halim, MD

## 2021-11-18 ENCOUNTER — Ambulatory Visit: Payer: Medicaid Other | Admitting: *Deleted

## 2021-11-18 ENCOUNTER — Ambulatory Visit (INDEPENDENT_AMBULATORY_CARE_PROVIDER_SITE_OTHER): Payer: Medicaid Other | Admitting: Family Medicine

## 2021-11-18 ENCOUNTER — Ambulatory Visit (HOSPITAL_BASED_OUTPATIENT_CLINIC_OR_DEPARTMENT_OTHER): Payer: Medicaid Other | Admitting: Obstetrics

## 2021-11-18 ENCOUNTER — Other Ambulatory Visit: Payer: Self-pay

## 2021-11-18 ENCOUNTER — Ambulatory Visit: Payer: Medicaid Other | Attending: Obstetrics

## 2021-11-18 ENCOUNTER — Encounter: Payer: Self-pay | Admitting: Family Medicine

## 2021-11-18 VITALS — BP 124/83 | HR 83 | Wt 210.2 lb

## 2021-11-18 VITALS — BP 130/61 | HR 90

## 2021-11-18 DIAGNOSIS — Z3A20 20 weeks gestation of pregnancy: Secondary | ICD-10-CM | POA: Diagnosis not present

## 2021-11-18 DIAGNOSIS — I7771 Dissection of carotid artery: Secondary | ICD-10-CM

## 2021-11-18 DIAGNOSIS — E71311 Medium chain acyl CoA dehydrogenase deficiency: Secondary | ICD-10-CM

## 2021-11-18 DIAGNOSIS — O42112 Preterm premature rupture of membranes, onset of labor more than 24 hours following rupture, second trimester: Secondary | ICD-10-CM | POA: Diagnosis not present

## 2021-11-18 DIAGNOSIS — O34219 Maternal care for unspecified type scar from previous cesarean delivery: Secondary | ICD-10-CM | POA: Diagnosis present

## 2021-11-18 DIAGNOSIS — O99212 Obesity complicating pregnancy, second trimester: Secondary | ICD-10-CM

## 2021-11-18 DIAGNOSIS — O09299 Supervision of pregnancy with other poor reproductive or obstetric history, unspecified trimester: Secondary | ICD-10-CM | POA: Insufficient documentation

## 2021-11-18 DIAGNOSIS — O99210 Obesity complicating pregnancy, unspecified trimester: Secondary | ICD-10-CM

## 2021-11-18 DIAGNOSIS — Z8673 Personal history of transient ischemic attack (TIA), and cerebral infarction without residual deficits: Secondary | ICD-10-CM

## 2021-11-18 DIAGNOSIS — Z2839 Other underimmunization status: Secondary | ICD-10-CM

## 2021-11-18 DIAGNOSIS — O24319 Unspecified pre-existing diabetes mellitus in pregnancy, unspecified trimester: Secondary | ICD-10-CM

## 2021-11-18 DIAGNOSIS — R569 Unspecified convulsions: Secondary | ICD-10-CM

## 2021-11-18 DIAGNOSIS — O1210 Gestational proteinuria, unspecified trimester: Secondary | ICD-10-CM

## 2021-11-18 DIAGNOSIS — O42912 Preterm premature rupture of membranes, unspecified as to length of time between rupture and onset of labor, second trimester: Secondary | ICD-10-CM | POA: Diagnosis not present

## 2021-11-18 DIAGNOSIS — E668 Other obesity: Secondary | ICD-10-CM

## 2021-11-18 DIAGNOSIS — O285 Abnormal chromosomal and genetic finding on antenatal screening of mother: Secondary | ICD-10-CM

## 2021-11-18 DIAGNOSIS — O4102X9 Oligohydramnios, second trimester, other fetus: Secondary | ICD-10-CM | POA: Insufficient documentation

## 2021-11-18 DIAGNOSIS — O09899 Supervision of other high risk pregnancies, unspecified trimester: Secondary | ICD-10-CM

## 2021-11-18 DIAGNOSIS — O4402 Placenta previa specified as without hemorrhage, second trimester: Secondary | ICD-10-CM

## 2021-11-18 DIAGNOSIS — Z7189 Other specified counseling: Secondary | ICD-10-CM

## 2021-11-18 DIAGNOSIS — O42919 Preterm premature rupture of membranes, unspecified as to length of time between rupture and onset of labor, unspecified trimester: Secondary | ICD-10-CM | POA: Insufficient documentation

## 2021-11-18 DIAGNOSIS — O0990 Supervision of high risk pregnancy, unspecified, unspecified trimester: Secondary | ICD-10-CM

## 2021-11-18 DIAGNOSIS — O10919 Unspecified pre-existing hypertension complicating pregnancy, unspecified trimester: Secondary | ICD-10-CM

## 2021-11-18 MED ORDER — "INSULIN SYRINGE 29G X 1/2"" 0.3 ML MISC": 1.0000 | 11 refills | Status: DC | PRN

## 2021-11-18 MED ORDER — INSULIN ASPART 100 UNIT/ML IJ SOLN: 18.0000 [IU] | Freq: Three times a day (TID) | INTRAMUSCULAR | 12 refills | Status: DC

## 2021-11-18 MED ORDER — INSULIN GLARGINE 100 UNIT/ML ~~LOC~~ SOLN
14.0000 [IU] | Freq: Two times a day (BID) | SUBCUTANEOUS | 11 refills | Status: DC
Start: 2021-11-18 — End: 2022-01-24

## 2021-11-18 NOTE — Progress Notes (Signed)
MFM Note  Joann Meyers was seen for follow-up ultrasound exam due to PPROM that occurred last week. She reports continued leakage of fluid over the past week.  She denies any signs or symptoms of an intrauterine infection.    Oligohydramnios with a total AFI of 1.4 cm was noted on today's exam.  A posterior placenta previa continues to be noted.    The poor prognosis for her pregnancy due to PPROM at a very early gestational age and due to oligohydramnios was discussed again today.  The patient has decided to continue her pregnancy and would like [place] to do everything possible to help her achieve the best outcome for her baby.   She understands that due to oligohydramnios at her early gestational age, there is an increased risk for fetal contractures and fetal pulmonary hypoplasia even if she delivers at viability or beyond.  The increased risk of an intrauterine infection, IUFD, and spontaneous labor due to PPROM was discussed.  Should she remain undelivered with a viable fetus at 22 weeks and 5 days, a course of antenatal corticosteroids should be administered at that time and consideration may be given to continued inpatient management until delivery.  The patient was advised to continue to monitor for signs and symptoms of an intrauterine infection such as fevers, shaking chills, and a foul-smelling discharge.  She understands to that she should go to the hospital should she have any signs or symptoms of an intrauterine infection or signs of labor.  We will continue to follow her with weekly ultrasound exams (as an outpatient) for fetal assessment until she reaches 22 weeks and 5 days.  She will return in 1 week for another ultrasound exam for fetal assessment.  The patient stated that all of her questions have been answered today.  A total of 15 she minutes was spent counseling and coordinating the care for this patient.  Greater than 50% of the time was spent in direct face-to-face  contact.

## 2021-11-18 NOTE — Progress Notes (Signed)
° °Subjective:  °Joann Meyers is a 35 y.o. G2P0101 at [redacted]w[redacted]d being seen today for ongoing prenatal care.  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 ° °Patient reports no complaints.  Contractions: Not present. Vag. Bleeding: None.  Movement: Present. Denies leaking of fluid.  ° °The following portions of the patient's history were reviewed and updated as appropriate: allergies, current medications, past family history, past medical history, past social history, past surgical history and problem list. Problem list updated. ° °Objective:  ° °Vitals:  ° 11/18/21 0825  °BP: 124/83  °Pulse: 83  °Weight: 210 lb 3.2 oz (95.3 kg)  ° ° °Fetal Status: Fetal Heart Rate (bpm): 132   Movement: Present    ° °General:  Alert, oriented and cooperative. Patient is in no acute distress.  °Skin: Skin is warm and dry. No rash noted.   °Cardiovascular: Normal heart rate noted  °Respiratory: Normal respiratory effort, no problems  with respiration noted  °Abdomen: Soft, gravid, appropriate for gestational age. Pain/Pressure: Absent     °Pelvic: Vag. Bleeding: None     °Cervical exam deferred        °Extremities: Normal range of motion.  Edema: None  °Mental Status: Normal mood and affect. Normal behavior. Normal judgment and thought content.  ° °Urinalysis:     ° °Assessment and Plan:  °Pregnancy: G2P0101 at [redacted]w[redacted]d ° °1. Supervision of high risk pregnancy, antepartum °BP and FHR normal today °AFP today °Reports she is still leaking fluid ° °2. Preexisting diabetes complicating pregnancy, antepartum °Sugars poorly controlled °Fastings range 110-160, post prandials 200-380 °Prescribed 1g Metformin BID and aspart 5u TID °Start weight based insulin °95 kg * 0.9 = 86 /3=28 °Start Lantus 14u BID (anaphylaxis to NPH ingredient unfortunately), 18u TID novolog °Due to issues with pharmacy given box of U-100 insulin syringes and new rx sent °Patient reports she is familiar with this dosing regimen from prior pregnancy °Discussed we will try to move towards getting an insulin pump, but will do this in the mean time °Has DM educator visit scheduled for 11/21/2021 °Pending MFM recs on whether or not to obtain fetal echo ° °3. Chronic hypertension affecting pregnancy °On ASA, labetalol 300 BID, and Nifed 30 daily °Well controlled ° °4. 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HCC) °Followed by neurology, no acute issues ° °5. History of TIA (transient ischemic attack) and stroke ° ° °6. Seizures (HCC) °Noted in chart but unclear what the circumstances were and not on any AEDs, likely noted around time of TIA/carotid dissection ° °7. Obesity in pregnancy, antepartum ° ° °8. Rubella non-immune status, antepartum °Offer MMR PP ° °9. 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 History of severe preeclampsia, prior pregnancy, currently pregnant °On ASA ° °11. Previous cesarean delivery, antepartum °  Mode of delivery not yet discussed, though  currently with posterior previa ° °12. Proteinuria affecting pregnancy, antepartum °New OB UPCR 1.6, likely secondary to longstanding DM2 and cHTN ° °13. Red Chart Rounds Patient ° ° °14. Placenta previa in second trimester °Noted on most recent anatomy scan ° °15. Preterm premature rupture of membranes in second trimester °Noted in MAU on 11/09/2021, amnisure positive °Most recent US with normal MVP though subjectively low appearing fluid °Reports she continues to leak fluid °Has follow up US w MFM later today ° °16. Abnormal genetic test during pregnancy °High risk for Trisomy 13/18 or Triploidy on NIPS °Previously declined amnio ° °Preterm labor symptoms and general obstetric precautions including but not limited to vaginal bleeding, contractions, leaking of fluid and fetal movement were reviewed in detail with the patient. °Please refer to After Visit Summary for other counseling recommendations.  °Return in 1 week (on 11/25/2021) for HRC, ob visit, needs MD. ° ° °Eckstat, Matthew M, MD ° °

## 2021-11-18 NOTE — Patient Instructions (Signed)

## 2021-11-20 LAB — AFP, SERUM, OPEN SPINA BIFIDA
AFP MoM: 1.31
AFP Value: 51.1 ng/mL
Gest. Age on Collection Date: 20.1 weeks
Maternal Age At EDD: 35.3 yr
OSBR Risk 1 IN: 1392
Test Results:: NEGATIVE
Weight: 210 [lb_av]

## 2021-11-21 ENCOUNTER — Other Ambulatory Visit: Payer: Self-pay

## 2021-11-21 ENCOUNTER — Encounter: Payer: Medicaid Other | Attending: Obstetrics & Gynecology | Admitting: Registered"

## 2021-11-21 DIAGNOSIS — Z91199 Patient's noncompliance with other medical treatment and regimen due to unspecified reason: Secondary | ICD-10-CM | POA: Diagnosis not present

## 2021-11-21 DIAGNOSIS — Z713 Dietary counseling and surveillance: Secondary | ICD-10-CM | POA: Diagnosis not present

## 2021-11-21 DIAGNOSIS — Z3A20 20 weeks gestation of pregnancy: Secondary | ICD-10-CM | POA: Insufficient documentation

## 2021-11-21 DIAGNOSIS — E119 Type 2 diabetes mellitus without complications: Secondary | ICD-10-CM | POA: Insufficient documentation

## 2021-11-21 DIAGNOSIS — O24319 Unspecified pre-existing diabetes mellitus in pregnancy, unspecified trimester: Secondary | ICD-10-CM

## 2021-11-21 DIAGNOSIS — O24112 Pre-existing diabetes mellitus, type 2, in pregnancy, second trimester: Secondary | ICD-10-CM | POA: Diagnosis not present

## 2021-11-21 DIAGNOSIS — O09522 Supervision of elderly multigravida, second trimester: Secondary | ICD-10-CM | POA: Diagnosis not present

## 2021-11-21 MED ORDER — FREESTYLE LIBRE 2 SENSOR MISC: 1.0000 | 11 refills | Status: DC

## 2021-11-21 NOTE — Patient Instructions (Signed)
When you feel shaky check you blood sugar. If it is lower 70 have a little juice or soda, or fruit like grapes. Then after feeling better eat some protein or a meal.

## 2021-11-21 NOTE — Progress Notes (Signed)
Patient was seen on 11/21/21 for follow-up for Type 2 Diabetes in pregnancy  START 4:50 pm       END 5:25 pm  EDD 04/06/2022, [redacted]w[redacted]d   A1c 7.4% 09/15/21  Pt states she stopped taking all her medication from Oct 19 - Feb 3. Restarted when discharged from hospital a few days ago.  Medications Metformin when wakes up and after dinner 18 units Insulin aspart 30 min before eating (bid) 14 units Lantus before breakfast and ~1 hr after dinner  SMBG: Forgets log/ daughter threw log sheet away FBS: 110 (20 min after eating grapes), before dinner >200 mg/dL. Doesn't check after meals  Pt has h/o hypoglycemia: Pt states she felt shaky when waking up this morning did not check blood sugar before eating a lot of grapes to feel better and BG was 110.  Diet: wakes up late, only eats 2x/day  Only drinking water since discharge Sample meal: eggs, beans, eggs, tortilla, bread. Pt states only small portions of bread.  Patient states she has a hard time with dietary counseling because she eats traditional foods made at home and not typical foods [place].  Plan: A prescription for the CGM will be sent to your pharmacy for the [facility], When you get it call [phone] and tell them you need a 60 min appointment to start the [place].  Until you start the [place] it is important to use your glucometer to check your blood sugar more often: Use one of your log book options to keep track of your readings For some meals check before and 2 hours after you eat. When you feel shaky check you blood sugar. If it is lower 70 have a little juice or soda, or fruit like grapes. Then after feeling better eat some protein or a meal.  Note: 09/21/21 initial visit discussed CGM and insulin pump. Today discussed importance of being able to reach her to follow up after starting things such as the CGM. Pt states she does not answer her phone to avoid spam calls but will call back if a message is left or recognizes  the number.

## 2021-11-23 ENCOUNTER — Telehealth: Payer: Self-pay | Admitting: Family Medicine

## 2021-11-23 NOTE — Telephone Encounter (Signed)
[facility] called and said that they do not have the gauge that was asked for the patient... they have the 31 gauge.

## 2021-11-23 NOTE — Telephone Encounter (Signed)
Contacted pharmacy and notified them it was okay to give pt 31 gauge needle.    Joann Meyers  11/23/21

## 2021-11-24 ENCOUNTER — Other Ambulatory Visit: Payer: Self-pay

## 2021-11-24 ENCOUNTER — Ambulatory Visit: Payer: Medicaid Other | Admitting: *Deleted

## 2021-11-24 ENCOUNTER — Ambulatory Visit: Payer: Medicaid Other | Attending: Obstetrics

## 2021-11-24 VITALS — BP 134/59 | HR 93

## 2021-11-24 DIAGNOSIS — O4402 Placenta previa specified as without hemorrhage, second trimester: Secondary | ICD-10-CM

## 2021-11-24 DIAGNOSIS — O4102X0 Oligohydramnios, second trimester, not applicable or unspecified: Secondary | ICD-10-CM | POA: Diagnosis not present

## 2021-11-24 DIAGNOSIS — O09299 Supervision of pregnancy with other poor reproductive or obstetric history, unspecified trimester: Secondary | ICD-10-CM

## 2021-11-24 DIAGNOSIS — O34219 Maternal care for unspecified type scar from previous cesarean delivery: Secondary | ICD-10-CM | POA: Insufficient documentation

## 2021-11-24 DIAGNOSIS — O09292 Supervision of pregnancy with other poor reproductive or obstetric history, second trimester: Secondary | ICD-10-CM

## 2021-11-24 DIAGNOSIS — O42112 Preterm premature rupture of membranes, onset of labor more than 24 hours following rupture, second trimester: Secondary | ICD-10-CM | POA: Insufficient documentation

## 2021-11-24 DIAGNOSIS — Z3A21 21 weeks gestation of pregnancy: Secondary | ICD-10-CM | POA: Diagnosis not present

## 2021-11-24 DIAGNOSIS — O42912 Preterm premature rupture of membranes, unspecified as to length of time between rupture and onset of labor, second trimester: Secondary | ICD-10-CM | POA: Diagnosis not present

## 2021-11-24 DIAGNOSIS — O42919 Preterm premature rupture of membranes, unspecified as to length of time between rupture and onset of labor, unspecified trimester: Secondary | ICD-10-CM | POA: Diagnosis not present

## 2021-11-24 DIAGNOSIS — O1210 Gestational proteinuria, unspecified trimester: Secondary | ICD-10-CM | POA: Diagnosis present

## 2021-11-24 NOTE — Progress Notes (Signed)
Pt states she has not felt any fetal movements today or yesterday. Did not notify her Doctor about the lack of movements.  Continues to observe watery fluid from vagina.

## 2021-11-25 ENCOUNTER — Encounter: Payer: Self-pay | Admitting: Family Medicine

## 2021-11-25 ENCOUNTER — Ambulatory Visit (INDEPENDENT_AMBULATORY_CARE_PROVIDER_SITE_OTHER): Payer: Medicaid Other | Admitting: Family Medicine

## 2021-11-25 VITALS — BP 130/85 | HR 92 | Wt 209.3 lb

## 2021-11-25 DIAGNOSIS — Z8673 Personal history of transient ischemic attack (TIA), and cerebral infarction without residual deficits: Secondary | ICD-10-CM

## 2021-11-25 DIAGNOSIS — Z7189 Other specified counseling: Secondary | ICD-10-CM

## 2021-11-25 DIAGNOSIS — Z2839 Other underimmunization status: Secondary | ICD-10-CM

## 2021-11-25 DIAGNOSIS — O0990 Supervision of high risk pregnancy, unspecified, unspecified trimester: Secondary | ICD-10-CM

## 2021-11-25 DIAGNOSIS — R569 Unspecified convulsions: Secondary | ICD-10-CM

## 2021-11-25 DIAGNOSIS — O285 Abnormal chromosomal and genetic finding on antenatal screening of mother: Secondary | ICD-10-CM

## 2021-11-25 DIAGNOSIS — O9081 Anemia of the puerperium: Secondary | ICD-10-CM

## 2021-11-25 DIAGNOSIS — E119 Type 2 diabetes mellitus without complications: Secondary | ICD-10-CM

## 2021-11-25 DIAGNOSIS — O34219 Maternal care for unspecified type scar from previous cesarean delivery: Secondary | ICD-10-CM

## 2021-11-25 DIAGNOSIS — O1210 Gestational proteinuria, unspecified trimester: Secondary | ICD-10-CM

## 2021-11-25 DIAGNOSIS — O09299 Supervision of pregnancy with other poor reproductive or obstetric history, unspecified trimester: Secondary | ICD-10-CM

## 2021-11-25 DIAGNOSIS — O99210 Obesity complicating pregnancy, unspecified trimester: Secondary | ICD-10-CM

## 2021-11-25 DIAGNOSIS — O99011 Anemia complicating pregnancy, first trimester: Secondary | ICD-10-CM

## 2021-11-25 DIAGNOSIS — I7771 Dissection of carotid artery: Secondary | ICD-10-CM

## 2021-11-25 DIAGNOSIS — O42112 Preterm premature rupture of membranes, onset of labor more than 24 hours following rupture, second trimester: Secondary | ICD-10-CM

## 2021-11-25 DIAGNOSIS — O09899 Supervision of other high risk pregnancies, unspecified trimester: Secondary | ICD-10-CM

## 2021-11-25 DIAGNOSIS — O10919 Unspecified pre-existing hypertension complicating pregnancy, unspecified trimester: Secondary | ICD-10-CM

## 2021-11-25 DIAGNOSIS — O4402 Placenta previa specified as without hemorrhage, second trimester: Secondary | ICD-10-CM

## 2021-11-25 MED ORDER — FAMOTIDINE 20 MG PO TABS: 20.0000 mg | ORAL_TABLET | Freq: Two times a day (BID) | ORAL | 3 refills | Status: DC

## 2021-11-25 NOTE — Progress Notes (Signed)
Subjective:  Joann Meyers is a 36 y.o. G2P0101 at 16w1dbeing seen today for ongoing prenatal care.  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  Patient reports no complaints.  Contractions: Not present. Vag. Bleeding: None.  Movement: Absent. Denies leaking of fluid.   The following portions of the patient's history were reviewed and updated as appropriate: allergies, current medications, past family history, past medical history, past social history, past surgical history and problem list. Problem list updated.  Objective:   Vitals:   11/25/21 0818  BP: 130/85  Pulse: 92  Weight: 209 lb 4.8 oz (94.9 kg)    Fetal Status: Fetal Heart Rate (bpm): 153   Movement: Absent     General:  Alert, oriented and cooperative. Patient is in no acute distress.  Skin: Skin is warm and dry. No rash noted.   Cardiovascular: Normal heart rate noted  Respiratory: Normal respiratory effort, no problems with  respiration noted  Abdomen: Soft, gravid, appropriate for gestational age. Pain/Pressure: Absent     Pelvic: Vag. Bleeding: None     Cervical exam deferred        Extremities: Normal range of motion.  Edema: None  Mental Status: Normal mood and affect. Normal behavior. Normal judgment and thought content.   Urinalysis:      Assessment and Plan:  Pregnancy: G2P0101 at 252w1d1. Supervision of high risk pregnancy, antepartum BP and FHR normal No fluid seen on bedside [place]. Preterm premature rupture of membranes in second trimester Noted in MAU on 11/09/2021 Reports she has continued leaking Oligo noted on [place], max pocket 1.26  3. Chronic hypertension affecting pregnancy On ASA, labetalol 300 BID, and Nifed 30 daily Well controlled  4. Type 2 diabetes mellitus without complication, without long-term current use of insulin (HCC) Forgot log Reports significant improvement with Lantus 14u BID and Lispro 18u TID On recall fastings are in the 80's Post prandials mostly in the 110's with exception of evening, admits to dietary indiscretion No change to regimen currently  5. 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HCC) Followed by neurology, no acute issues  6. History of TIA (transient ischemic attack) and stroke   7. Seizures ([place] in [place] but unclear what the circumstances were and not on any AEDs, likely noted around time of TIA/carotid dissection  8. Obesity in pregnancy, antepartum   9. Rubella non-immune status, antepartum Offer MMR PP  10. History of severe preeclampsia, prior pregnancy, currently pregnant On ASA  11. Previous cesarean delivery, antepartum   12. Proteinuria affecting pregnancy, antepartum New OB UPCR 1.6, likely secondary to longstanding DM2 and cHTN  13. Anemia affecting pregnancy in first trimester Noted on review of chart after visit, hgb 8.6 Would benefit from IV iron, previously declined it in  the MAU on 10/14/2022 Will send her  message and see if she would be amenable now  14. Placenta previa in second trimester Reviewed precautions in great detail, emphasized importance of going to hospital immediately if she has bleeding, abdominal pain  15. Red Chart Rounds Patient   16. Abnormal genetic test during pregnancy High risk T13/T18 on NIPS  Preterm labor symptoms and general obstetric precautions including but not limited to vaginal bleeding, contractions, leaking of fluid and fetal movement were reviewed in detail with the patient. Please refer to After Visit Summary for other counseling recommendations.  Return in 1 week (on 12/02/2021) for [facility], ob visit, needs MD.   Clarnce Flock, MD

## 2021-11-25 NOTE — Patient Instructions (Signed)

## 2021-12-01 ENCOUNTER — Other Ambulatory Visit: Payer: Self-pay | Admitting: *Deleted

## 2021-12-01 ENCOUNTER — Ambulatory Visit: Payer: Medicaid Other | Attending: Obstetrics | Admitting: Obstetrics

## 2021-12-01 ENCOUNTER — Ambulatory Visit: Payer: Medicaid Other | Admitting: *Deleted

## 2021-12-01 ENCOUNTER — Other Ambulatory Visit: Payer: Self-pay

## 2021-12-01 ENCOUNTER — Ambulatory Visit: Payer: Medicaid Other | Attending: Obstetrics

## 2021-12-01 VITALS — BP 119/64 | HR 85

## 2021-12-01 DIAGNOSIS — O4402 Placenta previa specified as without hemorrhage, second trimester: Secondary | ICD-10-CM

## 2021-12-01 DIAGNOSIS — O24414 Gestational diabetes mellitus in pregnancy, insulin controlled: Secondary | ICD-10-CM

## 2021-12-01 DIAGNOSIS — O09299 Supervision of pregnancy with other poor reproductive or obstetric history, unspecified trimester: Secondary | ICD-10-CM | POA: Diagnosis present

## 2021-12-01 DIAGNOSIS — O42912 Preterm premature rupture of membranes, unspecified as to length of time between rupture and onset of labor, second trimester: Secondary | ICD-10-CM | POA: Diagnosis not present

## 2021-12-01 DIAGNOSIS — O42112 Preterm premature rupture of membranes, onset of labor more than 24 hours following rupture, second trimester: Secondary | ICD-10-CM | POA: Diagnosis not present

## 2021-12-01 DIAGNOSIS — O24410 Gestational diabetes mellitus in pregnancy, diet controlled: Secondary | ICD-10-CM

## 2021-12-01 DIAGNOSIS — Z3A22 22 weeks gestation of pregnancy: Secondary | ICD-10-CM

## 2021-12-01 DIAGNOSIS — O1210 Gestational proteinuria, unspecified trimester: Secondary | ICD-10-CM

## 2021-12-01 DIAGNOSIS — O34219 Maternal care for unspecified type scar from previous cesarean delivery: Secondary | ICD-10-CM

## 2021-12-01 DIAGNOSIS — Z362 Encounter for other antenatal screening follow-up: Secondary | ICD-10-CM | POA: Diagnosis not present

## 2021-12-01 DIAGNOSIS — O42919 Preterm premature rupture of membranes, unspecified as to length of time between rupture and onset of labor, unspecified trimester: Secondary | ICD-10-CM

## 2021-12-01 DIAGNOSIS — O09292 Supervision of pregnancy with other poor reproductive or obstetric history, second trimester: Secondary | ICD-10-CM | POA: Diagnosis not present

## 2021-12-01 NOTE — Progress Notes (Signed)
Pt state she has not felt her baby move in the past 10 days.

## 2021-12-01 NOTE — Progress Notes (Signed)
MFM Note  Joann Meyers was seen for follow-up ultrasound due to PPROM that occurred at 18+ weeks. She denies any signs or symptoms of an intrauterine infection.  I have written a letter for her to the Department of Immigration to possibly help expedite her husband's visa to come to the [place].  The fetal growth appears appropriate for her gestational age (EFW 1 pound 1 ounces, 481 g, 52nd percentile for her gestational age).    Oligohydramnios with a total AFI of 1.6 cm was noted on today's exam.  A posterior placenta previa continues to be noted.    The poor prognosis for her pregnancy due to PPROM at a very early gestational age and due to oligohydramnios was discussed again today.    She understands that due to oligohydramnios at her early gestational age, there is an increased risk for fetal contractures and fetal pulmonary hypoplasia even if she delivers at viability or beyond.   As the patient would like [place] to do everything possible to help her achieve the best outcome for her baby, I have made plans for her to return to our office early next week when she will be 22 weeks and 5 days to receive a complete course of antenatal corticosteroids.   She should then be hospitalized until delivery starting at 23 weeks.  I will help the patient make arrangements to be admitted to the hospital at the end of next week.   She will return in 1 week for another ultrasound exam prior to being admitted to the hospital for fetal assessment.  The patient stated that all of her questions have been answered today.  A total of 20 she minutes was spent counseling and coordinating the care for this patient.  Greater than 50% of the time was spent in direct face-to-face contact.

## 2021-12-02 ENCOUNTER — Encounter: Payer: Medicaid Other | Admitting: Family Medicine

## 2021-12-06 ENCOUNTER — Other Ambulatory Visit: Payer: Self-pay | Admitting: Obstetrics & Gynecology

## 2021-12-06 ENCOUNTER — Other Ambulatory Visit: Payer: Self-pay

## 2021-12-06 ENCOUNTER — Ambulatory Visit: Payer: Medicaid Other | Attending: Obstetrics and Gynecology

## 2021-12-06 ENCOUNTER — Ambulatory Visit (INDEPENDENT_AMBULATORY_CARE_PROVIDER_SITE_OTHER): Payer: Medicaid Other | Admitting: Registered"

## 2021-12-06 ENCOUNTER — Ambulatory Visit: Payer: Medicaid Other | Admitting: *Deleted

## 2021-12-06 ENCOUNTER — Ambulatory Visit (HOSPITAL_BASED_OUTPATIENT_CLINIC_OR_DEPARTMENT_OTHER): Payer: Medicaid Other

## 2021-12-06 ENCOUNTER — Encounter: Payer: Medicaid Other | Admitting: Registered"

## 2021-12-06 ENCOUNTER — Other Ambulatory Visit: Payer: Self-pay | Admitting: Lactation Services

## 2021-12-06 ENCOUNTER — Other Ambulatory Visit: Payer: Self-pay | Admitting: Obstetrics

## 2021-12-06 VITALS — BP 131/68

## 2021-12-06 DIAGNOSIS — E119 Type 2 diabetes mellitus without complications: Secondary | ICD-10-CM

## 2021-12-06 DIAGNOSIS — O09522 Supervision of elderly multigravida, second trimester: Secondary | ICD-10-CM | POA: Diagnosis not present

## 2021-12-06 DIAGNOSIS — O368120 Decreased fetal movements, second trimester, not applicable or unspecified: Secondary | ICD-10-CM | POA: Diagnosis not present

## 2021-12-06 DIAGNOSIS — O09299 Supervision of pregnancy with other poor reproductive or obstetric history, unspecified trimester: Secondary | ICD-10-CM

## 2021-12-06 DIAGNOSIS — Z3A22 22 weeks gestation of pregnancy: Secondary | ICD-10-CM | POA: Insufficient documentation

## 2021-12-06 DIAGNOSIS — O42912 Preterm premature rupture of membranes, unspecified as to length of time between rupture and onset of labor, second trimester: Secondary | ICD-10-CM | POA: Insufficient documentation

## 2021-12-06 DIAGNOSIS — O99212 Obesity complicating pregnancy, second trimester: Secondary | ICD-10-CM | POA: Diagnosis not present

## 2021-12-06 DIAGNOSIS — O42919 Preterm premature rupture of membranes, unspecified as to length of time between rupture and onset of labor, unspecified trimester: Secondary | ICD-10-CM

## 2021-12-06 DIAGNOSIS — O4402 Placenta previa specified as without hemorrhage, second trimester: Secondary | ICD-10-CM

## 2021-12-06 DIAGNOSIS — O34219 Maternal care for unspecified type scar from previous cesarean delivery: Secondary | ICD-10-CM

## 2021-12-06 DIAGNOSIS — O09212 Supervision of pregnancy with history of pre-term labor, second trimester: Secondary | ICD-10-CM | POA: Diagnosis not present

## 2021-12-06 DIAGNOSIS — O1210 Gestational proteinuria, unspecified trimester: Secondary | ICD-10-CM

## 2021-12-06 DIAGNOSIS — O42112 Preterm premature rupture of membranes, onset of labor more than 24 hours following rupture, second trimester: Secondary | ICD-10-CM

## 2021-12-06 DIAGNOSIS — O24112 Pre-existing diabetes mellitus, type 2, in pregnancy, second trimester: Secondary | ICD-10-CM | POA: Diagnosis not present

## 2021-12-06 DIAGNOSIS — O10912 Unspecified pre-existing hypertension complicating pregnancy, second trimester: Secondary | ICD-10-CM | POA: Diagnosis not present

## 2021-12-06 MED ORDER — BETAMETHASONE SOD PHOS & ACET 6 (3-3) MG/ML IJ SUSP
12.0000 mg | Freq: Once | INTRAMUSCULAR | Status: AC
Start: 2021-12-06 — End: 2021-12-06
  Administered 2021-12-06: 12 mg via INTRAMUSCULAR

## 2021-12-06 MED ORDER — INSULIN ASPART 100 UNIT/ML FLEXPEN: 18.0000 [IU] | PEN_INJECTOR | Freq: Three times a day (TID) | SUBCUTANEOUS | 11 refills | Status: DC

## 2021-12-06 MED ORDER — PEN NEEDLES 32G X 5 MM MISC: 1.0000 | Freq: Three times a day (TID) | 6 refills | Status: DC

## 2021-12-06 NOTE — Patient Instructions (Addendum)
Great job consistently checking your fasting blood sugar. Start checking blood 1-2 hours after breakfast, lunch and dinner In your log book write the time when you start a meal and the next column write the time you check your blood sugar and the blood sugar reading.  For drinks try to keep to sugar free options  Try to find more low carb snacks that can be protein (nuts, eggs, meat etc) or vegetables. You can dip them in ranch or other dressing.  Instead of snacking on gummy bears find another sugar free snack such as sugar-free jello. Look for other things such as nuts to replace your chip snacks

## 2021-12-06 NOTE — Progress Notes (Signed)
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 EDD 04/06/22; [redacted]w[redacted]d.   Start 11:35     End 12:05  Pt reports Lantus am before getting out of bed and 14 units qhs Novolog 18 units 15-30 min before some meals.   Pt states she forgets to take meal time insulin sometimes, especially when outside the home. Pt states she doesn't want people to think she is doing drugs. Pt states she had used insulin pens before and would prefer that. Dr. [name] changed her Novog Rx to pens  Pt also states sometimes doesn't take with with lunch when not eating bread.  Pt reports snacks in between meals of candy and chips. Pt states she does not like diet soda, also states she is supposed to only drink water at this time because of pregnancy complications.   Pt states she boils her meat because she thought she wasn't supposed to use oil.  Pt brought blood sugar log. Patient checks 3x/day: FBS, before lunch and ~1 hour after dinner.  The following learning objectives reviewed during follow-up visit:  Purpose and timing of checking blood sugar Importance of including more protein in the diet Balancing protein and carbs  Plan:  Great job consistently checking your fasting blood sugar. Start checking blood 1-2 hours after breakfast, lunch and dinner In your log book write the time when you start a meal and the next column write the time you check your blood sugar and the blood sugar reading.  For drinks try to keep to sugar free options  Try to find more low carb snacks that can be protein (nuts, eggs, meat etc) or vegetables. You can dip them in ranch or other dressing.  Instead of snacking on gummy bears find another sugar free snack such as sugar-free jello. Look for other things such as nuts to replace your chip snacks  Patient instructed to monitor glucose levels: FBS: 70 - 95 mg/dl 2 hour: <120 mg/dl  Patient received the following handouts: none  Patient  will be seen for follow-up as needed.

## 2021-12-06 NOTE — Progress Notes (Unsigned)
MFM Note  Joann Meyers is currently at 22 weeks and 5 days following PPROM at 18+ weeks.  She returns today to receive a complete course of antenatal corticosteroids.  A limited ultrasound performed today shows a viable singleton gestation with anhydramnios.  The fetal heart rate was 143 bpm.  Fetal movements were noted throughout today's exam.  We will plan on administering a complete course of antenatal corticosteroids today and tomorrow.    She will be admitted for inpatient management until delivery at 23 weeks (in 2 days).

## 2021-12-07 ENCOUNTER — Ambulatory Visit (HOSPITAL_BASED_OUTPATIENT_CLINIC_OR_DEPARTMENT_OTHER): Payer: Medicaid Other

## 2021-12-07 ENCOUNTER — Other Ambulatory Visit: Payer: Self-pay | Admitting: Obstetrics and Gynecology

## 2021-12-07 ENCOUNTER — Ambulatory Visit (HOSPITAL_BASED_OUTPATIENT_CLINIC_OR_DEPARTMENT_OTHER): Payer: Medicaid Other | Admitting: Obstetrics

## 2021-12-07 ENCOUNTER — Ambulatory Visit: Payer: Medicaid Other | Admitting: *Deleted

## 2021-12-07 VITALS — BP 133/76 | HR 92

## 2021-12-07 DIAGNOSIS — O42112 Preterm premature rupture of membranes, onset of labor more than 24 hours following rupture, second trimester: Secondary | ICD-10-CM | POA: Diagnosis not present

## 2021-12-07 DIAGNOSIS — O24112 Pre-existing diabetes mellitus, type 2, in pregnancy, second trimester: Secondary | ICD-10-CM

## 2021-12-07 DIAGNOSIS — O99212 Obesity complicating pregnancy, second trimester: Secondary | ICD-10-CM | POA: Diagnosis not present

## 2021-12-07 DIAGNOSIS — Z3A22 22 weeks gestation of pregnancy: Secondary | ICD-10-CM

## 2021-12-07 DIAGNOSIS — O1210 Gestational proteinuria, unspecified trimester: Secondary | ICD-10-CM

## 2021-12-07 DIAGNOSIS — O09292 Supervision of pregnancy with other poor reproductive or obstetric history, second trimester: Secondary | ICD-10-CM

## 2021-12-07 DIAGNOSIS — O42912 Preterm premature rupture of membranes, unspecified as to length of time between rupture and onset of labor, second trimester: Secondary | ICD-10-CM | POA: Diagnosis not present

## 2021-12-07 DIAGNOSIS — O10912 Unspecified pre-existing hypertension complicating pregnancy, second trimester: Secondary | ICD-10-CM

## 2021-12-07 DIAGNOSIS — O42919 Preterm premature rupture of membranes, unspecified as to length of time between rupture and onset of labor, unspecified trimester: Secondary | ICD-10-CM

## 2021-12-07 DIAGNOSIS — O368120 Decreased fetal movements, second trimester, not applicable or unspecified: Secondary | ICD-10-CM

## 2021-12-07 DIAGNOSIS — O09299 Supervision of pregnancy with other poor reproductive or obstetric history, unspecified trimester: Secondary | ICD-10-CM

## 2021-12-07 DIAGNOSIS — O34219 Maternal care for unspecified type scar from previous cesarean delivery: Secondary | ICD-10-CM

## 2021-12-07 MED ORDER — BETAMETHASONE SOD PHOS & ACET 6 (3-3) MG/ML IJ SUSP
12.0000 mg | Freq: Once | INTRAMUSCULAR | Status: AC
Start: 2021-12-07 — End: 2021-12-07
  Administered 2021-12-07: 12 mg via INTRAMUSCULAR

## 2021-12-07 NOTE — Progress Notes (Signed)
MFM Note   Joann Meyers was seen for a follow-up ultrasound due to PPROM that occurred at 18+ weeks. She is now 22 weeks and 6 days pregnant. She denies any signs or symptoms of an intrauterine infection.    She had a growth scan last week that showed an overall EFW of 1 pound 1 ounces, 481 g, 52nd percentile for her gestational age.  A viable singleton intrauterine gestation with anhydramnios was noted today.  Fetal movements were noted during today's exam. The fetus was in the breech presentation.   A posterior placenta previa continues to be noted.     The poor prognosis for her pregnancy due to PPROM at a very early gestational age and due to anhydramnios was discussed again today.     She understands that due to oligohydramnios/anhydramnios at her early gestational age, there is an increased risk for fetal contractures and fetal pulmonary hypoplasia even if she delivers at viability or beyond.    As the patient would like [place] to do everything possible to help her achieve the best outcome for her baby, she will be admitted tomorrow when she is 23 weeks for continued inpatient management until delivery. She has already received a complete course of antenatal corticosteroids yesterday and today in our office.   While hospitalized, she should receive daily fetal testing.    She should have another ultrasound to assess the fetal growth and placental location in 3 weeks.    The patient understands that the goal for her delivery would be at around 34 weeks.    However, delivery prior to 34 weeks would be indicated should she go into spontaneous labor, should she show any signs of an intrauterine infection, or at any time for nonreassuring fetal status.  She plans on being admitted to the hospital at around 11 am to 12:00 pm tomorrow.   The patient stated that all of her questions have been answered today.   A total of 20 she minutes was spent counseling and coordinating the care for this  patient.  Greater than 50% of the time was spent in direct face-to-face contact.

## 2021-12-08 ENCOUNTER — Inpatient Hospital Stay (HOSPITAL_COMMUNITY)
Admission: AD | Admit: 2021-12-08 | Discharge: 2022-01-24 | DRG: 786 | Disposition: A | Discharge status: Home / Self Care | Payer: Medicaid Other | Attending: Obstetrics & Gynecology | Admitting: Obstetrics & Gynecology

## 2021-12-08 ENCOUNTER — Encounter (HOSPITAL_COMMUNITY): Payer: Self-pay | Admitting: Obstetrics & Gynecology

## 2021-12-08 ENCOUNTER — Other Ambulatory Visit: Payer: Self-pay

## 2021-12-08 DIAGNOSIS — Z20822 Contact with and (suspected) exposure to covid-19: Secondary | ICD-10-CM | POA: Diagnosis present

## 2021-12-08 DIAGNOSIS — O4403 Placenta previa specified as without hemorrhage, third trimester: Secondary | ICD-10-CM | POA: Diagnosis not present

## 2021-12-08 DIAGNOSIS — O4590 Premature separation of placenta, unspecified, unspecified trimester: Secondary | ICD-10-CM | POA: Diagnosis not present

## 2021-12-08 DIAGNOSIS — Z794 Long term (current) use of insulin: Secondary | ICD-10-CM | POA: Diagnosis not present

## 2021-12-08 DIAGNOSIS — K5900 Constipation, unspecified: Secondary | ICD-10-CM | POA: Diagnosis present

## 2021-12-08 DIAGNOSIS — Z3A27 27 weeks gestation of pregnancy: Secondary | ICD-10-CM | POA: Diagnosis not present

## 2021-12-08 DIAGNOSIS — O321XX0 Maternal care for breech presentation, not applicable or unspecified: Secondary | ICD-10-CM | POA: Diagnosis present

## 2021-12-08 DIAGNOSIS — I7771 Dissection of carotid artery: Secondary | ICD-10-CM | POA: Diagnosis present

## 2021-12-08 DIAGNOSIS — E11649 Type 2 diabetes mellitus with hypoglycemia without coma: Secondary | ICD-10-CM | POA: Diagnosis present

## 2021-12-08 DIAGNOSIS — Z98891 History of uterine scar from previous surgery: Principal | ICD-10-CM

## 2021-12-08 DIAGNOSIS — O1092 Unspecified pre-existing hypertension complicating childbirth: Secondary | ICD-10-CM | POA: Diagnosis not present

## 2021-12-08 DIAGNOSIS — O9912 Other diseases of the blood and blood-forming organs and certain disorders involving the immune mechanism complicating childbirth: Secondary | ICD-10-CM | POA: Diagnosis present

## 2021-12-08 DIAGNOSIS — O4400 Placenta previa specified as without hemorrhage, unspecified trimester: Secondary | ICD-10-CM | POA: Diagnosis not present

## 2021-12-08 DIAGNOSIS — O99112 Other diseases of the blood and blood-forming organs and certain disorders involving the immune mechanism complicating pregnancy, second trimester: Secondary | ICD-10-CM | POA: Diagnosis present

## 2021-12-08 DIAGNOSIS — O10019 Pre-existing essential hypertension complicating pregnancy, unspecified trimester: Secondary | ICD-10-CM | POA: Diagnosis not present

## 2021-12-08 DIAGNOSIS — Z3A23 23 weeks gestation of pregnancy: Secondary | ICD-10-CM | POA: Diagnosis not present

## 2021-12-08 DIAGNOSIS — E71311 Medium chain acyl CoA dehydrogenase deficiency: Secondary | ICD-10-CM | POA: Diagnosis present

## 2021-12-08 DIAGNOSIS — Z3A29 29 weeks gestation of pregnancy: Secondary | ICD-10-CM | POA: Diagnosis not present

## 2021-12-08 DIAGNOSIS — O9081 Anemia of the puerperium: Secondary | ICD-10-CM | POA: Diagnosis not present

## 2021-12-08 DIAGNOSIS — Z2839 Other underimmunization status: Secondary | ICD-10-CM

## 2021-12-08 DIAGNOSIS — O4593 Premature separation of placenta, unspecified, third trimester: Secondary | ICD-10-CM | POA: Diagnosis present

## 2021-12-08 DIAGNOSIS — O1002 Pre-existing essential hypertension complicating childbirth: Secondary | ICD-10-CM | POA: Diagnosis present

## 2021-12-08 DIAGNOSIS — O42919 Preterm premature rupture of membranes, unspecified as to length of time between rupture and onset of labor, unspecified trimester: Secondary | ICD-10-CM | POA: Diagnosis present

## 2021-12-08 DIAGNOSIS — O34219 Maternal care for unspecified type scar from previous cesarean delivery: Secondary | ICD-10-CM | POA: Diagnosis present

## 2021-12-08 DIAGNOSIS — O0990 Supervision of high risk pregnancy, unspecified, unspecified trimester: Secondary | ICD-10-CM

## 2021-12-08 DIAGNOSIS — O99210 Obesity complicating pregnancy, unspecified trimester: Secondary | ICD-10-CM | POA: Diagnosis present

## 2021-12-08 DIAGNOSIS — O10919 Unspecified pre-existing hypertension complicating pregnancy, unspecified trimester: Secondary | ICD-10-CM | POA: Diagnosis present

## 2021-12-08 DIAGNOSIS — Z23 Encounter for immunization: Secondary | ICD-10-CM

## 2021-12-08 DIAGNOSIS — O365930 Maternal care for other known or suspected poor fetal growth, third trimester, not applicable or unspecified: Secondary | ICD-10-CM | POA: Diagnosis present

## 2021-12-08 DIAGNOSIS — O42912 Preterm premature rupture of membranes, unspecified as to length of time between rupture and onset of labor, second trimester: Secondary | ICD-10-CM

## 2021-12-08 DIAGNOSIS — O9972 Diseases of the skin and subcutaneous tissue complicating childbirth: Secondary | ICD-10-CM | POA: Diagnosis not present

## 2021-12-08 DIAGNOSIS — O09292 Supervision of pregnancy with other poor reproductive or obstetric history, second trimester: Secondary | ICD-10-CM | POA: Diagnosis not present

## 2021-12-08 DIAGNOSIS — O281 Abnormal biochemical finding on antenatal screening of mother: Secondary | ICD-10-CM | POA: Diagnosis not present

## 2021-12-08 DIAGNOSIS — Z3A25 25 weeks gestation of pregnancy: Secondary | ICD-10-CM | POA: Diagnosis not present

## 2021-12-08 DIAGNOSIS — O09213 Supervision of pregnancy with history of pre-term labor, third trimester: Secondary | ICD-10-CM | POA: Diagnosis not present

## 2021-12-08 DIAGNOSIS — O285 Abnormal chromosomal and genetic finding on antenatal screening of mother: Secondary | ICD-10-CM | POA: Diagnosis present

## 2021-12-08 DIAGNOSIS — O4290 Premature rupture of membranes, unspecified as to length of time between rupture and onset of labor, unspecified weeks of gestation: Secondary | ICD-10-CM | POA: Diagnosis present

## 2021-12-08 DIAGNOSIS — O42112 Preterm premature rupture of membranes, onset of labor more than 24 hours following rupture, second trimester: Secondary | ICD-10-CM | POA: Diagnosis present

## 2021-12-08 DIAGNOSIS — K047 Periapical abscess without sinus: Secondary | ICD-10-CM | POA: Diagnosis not present

## 2021-12-08 DIAGNOSIS — O09899 Supervision of other high risk pregnancies, unspecified trimester: Secondary | ICD-10-CM

## 2021-12-08 DIAGNOSIS — Z8673 Personal history of transient ischemic attack (TIA), and cerebral infarction without residual deficits: Secondary | ICD-10-CM | POA: Diagnosis not present

## 2021-12-08 DIAGNOSIS — D6959 Other secondary thrombocytopenia: Secondary | ICD-10-CM | POA: Diagnosis present

## 2021-12-08 DIAGNOSIS — O09299 Supervision of pregnancy with other poor reproductive or obstetric history, unspecified trimester: Secondary | ICD-10-CM

## 2021-12-08 DIAGNOSIS — D696 Thrombocytopenia, unspecified: Secondary | ICD-10-CM | POA: Diagnosis present

## 2021-12-08 DIAGNOSIS — O24319 Unspecified pre-existing diabetes mellitus in pregnancy, unspecified trimester: Secondary | ICD-10-CM | POA: Diagnosis present

## 2021-12-08 DIAGNOSIS — O99012 Anemia complicating pregnancy, second trimester: Secondary | ICD-10-CM | POA: Diagnosis not present

## 2021-12-08 DIAGNOSIS — D62 Acute posthemorrhagic anemia: Secondary | ICD-10-CM | POA: Diagnosis not present

## 2021-12-08 DIAGNOSIS — L309 Dermatitis, unspecified: Secondary | ICD-10-CM | POA: Diagnosis not present

## 2021-12-08 DIAGNOSIS — Z7984 Long term (current) use of oral hypoglycemic drugs: Secondary | ICD-10-CM

## 2021-12-08 DIAGNOSIS — O99892 Other specified diseases and conditions complicating childbirth: Secondary | ICD-10-CM | POA: Diagnosis not present

## 2021-12-08 DIAGNOSIS — O99214 Obesity complicating childbirth: Secondary | ICD-10-CM | POA: Diagnosis present

## 2021-12-08 DIAGNOSIS — O2412 Pre-existing diabetes mellitus, type 2, in childbirth: Secondary | ICD-10-CM | POA: Diagnosis present

## 2021-12-08 DIAGNOSIS — O365990 Maternal care for other known or suspected poor fetal growth, unspecified trimester, not applicable or unspecified: Secondary | ICD-10-CM | POA: Diagnosis not present

## 2021-12-08 DIAGNOSIS — Z3A26 26 weeks gestation of pregnancy: Secondary | ICD-10-CM | POA: Diagnosis not present

## 2021-12-08 DIAGNOSIS — O329XX0 Maternal care for malpresentation of fetus, unspecified, not applicable or unspecified: Secondary | ICD-10-CM | POA: Diagnosis present

## 2021-12-08 DIAGNOSIS — O42913 Preterm premature rupture of membranes, unspecified as to length of time between rupture and onset of labor, third trimester: Secondary | ICD-10-CM | POA: Diagnosis not present

## 2021-12-08 HISTORY — DX: Type 2 diabetes mellitus without complications: E11.9

## 2021-12-08 LAB — CBC
HCT: 24.1 % — ABNORMAL LOW (ref 36.0–46.0)
Hemoglobin: 8 g/dL — ABNORMAL LOW (ref 12.0–15.0)
MCH: 27.3 pg (ref 26.0–34.0)
MCHC: 33.2 g/dL (ref 30.0–36.0)
MCV: 82.3 fL (ref 80.0–100.0)
Platelets: 165 10*3/uL (ref 150–400)
RBC: 2.93 MIL/uL — ABNORMAL LOW (ref 3.87–5.11)
RDW: 16.9 % — ABNORMAL HIGH (ref 11.5–15.5)
WBC: 8.3 10*3/uL (ref 4.0–10.5)
nRBC: 0 % (ref 0.0–0.2)

## 2021-12-08 LAB — TYPE AND SCREEN
ABO/RH(D): O POS
Antibody Screen: NEGATIVE

## 2021-12-08 LAB — URINALYSIS, ROUTINE W REFLEX MICROSCOPIC
Bilirubin Urine: NEGATIVE
Glucose, UA: >=500 mg/dL — AB
Hgb urine dipstick: NEGATIVE
Ketones, ur: NEGATIVE mg/dL
Leukocytes,Ua: NEGATIVE
Nitrite: NEGATIVE
Protein, ur: NEGATIVE mg/dL
Specific Gravity, Urine: 1.021 (ref 1.005–1.030)
pH: 6 (ref 5.0–8.0)

## 2021-12-08 LAB — RESP PANEL BY RT-PCR (FLU A&B, COVID) ARPGX2
Influenza A by PCR: NEGATIVE
Influenza B by PCR: NEGATIVE
SARS Coronavirus 2 by RT PCR: NEGATIVE

## 2021-12-08 LAB — GLUCOSE, CAPILLARY: Glucose-Capillary: 355 mg/dL — ABNORMAL HIGH (ref 70–99)

## 2021-12-08 MED ORDER — INSULIN GLARGINE-YFGN 100 UNIT/ML ~~LOC~~ SOLN
14.0000 [IU] | Freq: Two times a day (BID) | SUBCUTANEOUS | Status: DC
  Administered 2021-12-08: 14 [IU] via SUBCUTANEOUS
  Filled 2021-12-08 (×3): qty 0.14

## 2021-12-08 MED ORDER — ASPIRIN EC 81 MG PO TBEC
81.0000 mg | DELAYED_RELEASE_TABLET | Freq: Every day | ORAL | Status: DC
Start: 2021-12-08 — End: 2022-01-23
  Administered 2021-12-09 – 2022-01-22 (×45): 81 mg via ORAL
  Filled 2021-12-08 (×46): qty 1

## 2021-12-08 MED ORDER — CALCIUM CARBONATE ANTACID 500 MG PO CHEW
2.0000 | CHEWABLE_TABLET | ORAL | Status: DC | PRN
  Administered 2021-12-08 – 2021-12-24 (×4): 400 mg via ORAL
  Filled 2021-12-08 (×4): qty 2

## 2021-12-08 MED ORDER — DOCUSATE SODIUM 100 MG PO CAPS
100.0000 mg | ORAL_CAPSULE | Freq: Every day | ORAL | Status: DC
  Administered 2021-12-10 – 2021-12-14 (×5): 100 mg via ORAL
  Filled 2021-12-08 (×7): qty 1

## 2021-12-08 MED ORDER — INSULIN ASPART 100 UNIT/ML FLEXPEN: 18.0000 [IU] | PEN_INJECTOR | Freq: Three times a day (TID) | SUBCUTANEOUS | Status: DC

## 2021-12-08 MED ORDER — ZOLPIDEM TARTRATE 5 MG PO TABS: 5.0000 mg | ORAL_TABLET | Freq: Every evening | ORAL | Status: DC | PRN

## 2021-12-08 MED ORDER — PANTOPRAZOLE SODIUM 40 MG PO TBEC
40.0000 mg | DELAYED_RELEASE_TABLET | Freq: Every day | ORAL | Status: DC
  Administered 2021-12-08 – 2022-01-24 (×48): 40 mg via ORAL
  Filled 2021-12-08 (×48): qty 1

## 2021-12-08 MED ORDER — SODIUM CHLORIDE 0.9 % IV SOLN
2.0000 g | Freq: Four times a day (QID) | INTRAVENOUS | Status: AC
  Administered 2021-12-08 – 2021-12-10 (×8): 2 g via INTRAVENOUS
  Filled 2021-12-08 (×8): qty 2000

## 2021-12-08 MED ORDER — NIFEDIPINE ER OSMOTIC RELEASE 30 MG PO TB24
30.0000 mg | ORAL_TABLET | Freq: Every day | ORAL | Status: DC
Start: 2021-12-08 — End: 2022-01-24
  Administered 2021-12-09 – 2022-01-24 (×47): 30 mg via ORAL
  Filled 2021-12-08 (×50): qty 1

## 2021-12-08 MED ORDER — AZITHROMYCIN 250 MG PO TABS
1000.0000 mg | ORAL_TABLET | Freq: Once | ORAL | Status: AC
  Administered 2021-12-08: 1000 mg via ORAL
  Filled 2021-12-08: qty 4

## 2021-12-08 MED ORDER — ACETAMINOPHEN 325 MG PO TABS
650.0000 mg | ORAL_TABLET | ORAL | Status: DC | PRN
  Administered 2021-12-11 – 2022-01-16 (×19): 650 mg via ORAL
  Filled 2021-12-08 (×22): qty 2

## 2021-12-08 MED ORDER — PRENATAL PLUS 27-1 MG PO TABS
1.0000 | ORAL_TABLET | Freq: Every day | ORAL | Status: DC
  Filled 2021-12-08: qty 1

## 2021-12-08 MED ORDER — AMOXICILLIN 500 MG PO CAPS
500.0000 mg | ORAL_CAPSULE | Freq: Three times a day (TID) | ORAL | Status: AC
  Administered 2021-12-10 – 2021-12-15 (×15): 500 mg via ORAL
  Filled 2021-12-08 (×15): qty 1

## 2021-12-08 MED ORDER — INSULIN ASPART 100 UNIT/ML IJ SOLN
18.0000 [IU] | Freq: Three times a day (TID) | INTRAMUSCULAR | Status: DC
  Administered 2021-12-08 – 2021-12-09 (×2): 18 [IU] via SUBCUTANEOUS

## 2021-12-08 MED ORDER — LACTATED RINGERS IV SOLN: INTRAVENOUS | Status: DC

## 2021-12-08 MED ORDER — COMPLETENATE 29-1 MG PO CHEW
1.0000 | CHEWABLE_TABLET | Freq: Every day | ORAL | Status: DC
  Administered 2021-12-09 – 2022-01-22 (×40): 1 via ORAL
  Filled 2021-12-08 (×42): qty 1

## 2021-12-08 MED ORDER — METFORMIN HCL 500 MG PO TABS
1000.0000 mg | ORAL_TABLET | Freq: Two times a day (BID) | ORAL | Status: DC
  Administered 2021-12-08 – 2021-12-30 (×43): 1000 mg via ORAL
  Filled 2021-12-08 (×44): qty 2

## 2021-12-08 MED ORDER — LABETALOL HCL 200 MG PO TABS
300.0000 mg | ORAL_TABLET | Freq: Two times a day (BID) | ORAL | Status: DC
  Administered 2021-12-08 – 2022-01-17 (×80): 300 mg via ORAL
  Filled 2021-12-08 (×80): qty 1

## 2021-12-08 NOTE — Plan of Care (Signed)
Problem: Education: °Goal: Knowledge of General Education information will improve °Description: Including pain rating scale, medication(s)/side effects and non-pharmacologic comfort measures °Outcome: Completed/Met °  °

## 2021-12-08 NOTE — H&P (Signed)
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 ultrasound due to  PPROM that occurred at 18+ weeks. She denies any signs or  symptoms of an intrauterine infection.  I have written a letter  for her to the [employer] of [employer] to possibly help  expedite  her husband's visa to come to the [place].  The fetal growth appears appropriate for her gestational age  (EFW 1 pound 1 ounces, 481 g, 52nd percentile for her  gestational age).  Oligohydramnios with a total AFI of 1.6 cm was noted on  today's exam.  A posterior placenta previa continues to be  noted.  The poor prognosis for her pregnancy due to PPROM at a  very early gestational age and due to oligohydramnios was  discussed again today.  She understands that due to oligohydramnios at her early  gestational age, there is an  increased risk for fetal  contractures and fetal pulmonary hypoplasia even if she  delivers at viability or beyond.  As the patient would like [place] to do everything possible to help  her achieve the best outcome for her baby, I have made  plans for her to return to our office early next week when she  will be 22 weeks and 5 days to receive a complete course of  antenatal corticosteroids.  She should then be hospitalized until delivery starting at 23  weeks.  I will help the patient make arrangements to be  admitted to the hospital at the end of next week.  She will return in 1 week for another ultrasound exam prior to  being admitted to the hospital for fetal assessment.  The patient stated that all of her questions have been  answered today.  A total of 20 she minutes was spent counseling and  coordinating the care for this patient.  Greater than 50% of  the time was spent in direct face-to-face contact. ----------------------------------------------------------------------                   Johnell Comings, MD Electronically Signed Final Report   12/01/2021 05:34 pm ----------------------------------------------------------------------  [place] MFM OB LIMITED  Result Date: 12/07/2021 ----------------------------------------------------------------------  OBSTETRICS REPORT                       (Signed Final 12/07/2021 01:53 pm) ---------------------------------------------------------------------- Patient Info  ID #:       [id number]                          D.O.B.:  1985-12-27 (36 yrs)  Name:       Argentina Ponder                 Visit Date: 12/07/2021 12:52 pm ---------------------------------------------------------------------- Performed By  Attending:        Johnell Comings MD         Ref. Address:     [address]                                                             [place], [place]  Performed By:     Lelan Pons RDMS       [facility]                                                             [place] for                                                             Women  Referred By:      [facility] ---------------------------------------------------------------------- Orders  #  Description                           Code        Ordered By  1  [place] MFM OB LIMITED                     [phone]    YU FANG ----------------------------------------------------------------------  #  Order #                     Accession #                Episode #  1  [id number]                   [id number]                 [id number] ---------------------------------------------------------------------- Indications  Premature rupture of membranes - leaking       O42.90  fluid  Decreased fetal movement                       O36.8190  Abnormal biochemical finding on antenatal      O28.1  screening of mother (panorama increased  risk triploidy  Poor obstetric history: Previous preeclampsia  O09.299  Poor obstetric history: Previous preterm       O09.219  delivery, antepartum  Hypertension - Chronic/Pre-existing            O17.019  Advanced maternal age multigravida 63+,        O50.522  second trimester (29 yrs)  Obesity complicating pregnancy, second         O99.212  trimester(BMI 100)  Gestational diabetes in pregnancy,             O24.415  controlled by oral hypoglycemic drugs  (metformin)  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 Cord Insertion:      Previously Visualized  Amniotic Fluid  AFI FV:      Anhydramnios ---------------------------------------------------------------------- Biometry  LV:        4.7  mm ---------------------------------------------------------------------- OB History  Gravidity:    2         Term:   1  Living:       1 ---------------------------------------------------------------------- Gestational Age  LMP:           22w 6d        Date:  06/30/21                 EDD:   04/06/22  Best:          Kathrene Bongo 6d     Det. By:  LMP  (06/30/21)          EDD:   04/06/22 ---------------------------------------------------------------------- Anatomy  Stomach:               Not well visualized    Bladder:                Small ---------------------------------------------------------------------- Cervix Uterus Adnexa  Cervix  Normal appearance by transabdominal scan.  Right Ovary  Not visualized.  Left Ovary  Visualized. ---------------------------------------------------------------------- Comments  Hanae Segers was seen for a follow-up ultrasound due to  PPROM that occurred at 18+ weeks. She is now 22 weeks  and 6 days pregnant. She denies any signs or symptoms of  an intrauterine infection.  She had a growth scan last week that showed an overall  EFW of 1 pound 1 ounces, 481 g, 52nd percentile for her  gestational age.  A viable singleton intrauterine gestation with anhydramnios  was noted today.  Fetal movements were noted during  today's exam. The fetus was in the breech presentation.  A posterior placenta previa continues to be noted.  The poor prognosis for her pregnancy due to PPROM at a  very early gestational age and due to anhydramnios was  discussed again today.  She understands that due to oligohydramnios/anhydramnios  at her early gestational age, there is an increased risk for  fetal contractures and fetal pulmonary hypoplasia even if she  delivers at viability or beyond.   As the patient would like [place] to do everything possible to help  her achieve the best outcome for her baby, she will be  admitted tomorrow when she is 23 weeks for continued  inpatient management until delivery. She has already  received a complete course of antenatal corticosteroids  yesterday and today in our office.  While hospitalized, she should receive daily fetal testing.  She should have another ultrasound to assess the fetal  growth and placental location in 3 weeks.  The patient understands that the goal for her delivery would  be at around 34 weeks.  However, delivery prior to 34 weeks would be indicated  should she go into spontaneous labor, should she show any  signs of an intrauterine infection, or at any time for  nonreassuring fetal status.  She plans on being admitted to the hospital at around 11 am  to  12:00 pm tomorrow.  The patient stated that all of her questions have been  answered today.  A total of 20 she minutes was spent counseling and  coordinating the care for this patient.  Greater than 50% of  the time was spent in direct face-to-face contact. ----------------------------------------------------------------------                   Johnell Comings, MD Electronically Signed Final Report   12/07/2021 01:53 pm ----------------------------------------------------------------------  [place] MFM OB LIMITED  Result Date: 12/06/2021 ----------------------------------------------------------------------  OBSTETRICS REPORT                       (Signed Final 12/06/2021 01:51 pm) ---------------------------------------------------------------------- Patient Info  ID #:       [id number]                          D.O.B.:  October 31, 1985 (36 yrs)  Name:       Argentina Ponder                 Visit Date: 12/06/2021 11:14 am ---------------------------------------------------------------------- Performed By  Attending:        Johnell Comings MD         Ref. Address:     [address]                                                              [place], [place]  Performed By:     Jeanene Erb BS,      [facility]                                     [facility] at                                                             [place] for                                                             Women  Referred By:      [facility]                    for Women ---------------------------------------------------------------------- Orders  #  Description  Code        Ordered By  1  [place] MFM OB LIMITED                     [id number]    Peterson Ao ----------------------------------------------------------------------  #  Order #                     Accession #                Episode #  1  [id number]                   [id number]                 [id number] ---------------------------------------------------------------------- Indications  Premature rupture of membranes - leaking       O42.90  fluid  Decreased fetal movement                       O36.8190  Abnormal biochemical finding on antenatal      O28.1  screening of mother (panorama increased  risk triploidy  Poor obstetric history: Previous preeclampsia  O09.299  Poor obstetric history: Previous preterm       O09.219  delivery, antepartum  Hypertension - Chronic/Pre-existing            O7.019  Advanced maternal age multigravida 56+,        O18.522  second trimester (83 yrs)  Obesity complicating pregnancy, second         O99.212  trimester(BMI 61)  Gestational diabetes in pregnancy,             O24.415  controlled by oral hypoglycemic drugs  (metformin)  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 Cord Insertion:      Previously Visualized  Amniotic Fluid  AFI FV:      Anhydramnios ---------------------------------------------------------------------- OB History  Gravidity:    2         Term:   1  Living:       1 ---------------------------------------------------------------------- Gestational Age  LMP:           22w 5d        Date:  06/30/21                 EDD:   04/06/22  Best:          22w 5d     Det. By:  LMP  (06/30/21)          EDD:   04/06/22 ---------------------------------------------------------------------- Anatomy  Stomach:               Not well visualized    Bladder:                Appears normal  Cord Vessels:  Appears normal (3                         vessel cord) ---------------------------------------------------------------------- Doppler - Fetal Vessels  Umbilical Artery   S/D     %tile      RI    %tile      PI    %tile            ADFV    RDFV   3.23       29    0.69       34    1.12       41               No      No ---------------------------------------------------------------------- Comments  Deliliah Alanis is currently at 22 weeks and 5 days following  PPROM at 18+ weeks.  She returns today to receive a complete course of antenatal  corticosteroids.  A limited ultrasound performed today shows a viable  singleton gestation with anhydramnios.  The fetal heart rate  was 143 bpm.  Fetal movements were noted throughout  today's exam.  We will plan on administering a complete course of antenatal  corticosteroids today and tomorrow.  She will be admitted for inpatient management until delivery  at 23 weeks (in 2 days). ----------------------------------------------------------------------                   Johnell Comings, MD  Electronically Signed Final Report   12/06/2021 01:51 pm ----------------------------------------------------------------------  [place] MFM OB LIMITED  Result Date: 11/24/2021 ----------------------------------------------------------------------  OBSTETRICS REPORT                       (Signed Final 11/24/2021 03:11 pm) ---------------------------------------------------------------------- Patient Info  ID #:       [id number]                          D.O.B.:  11/20/1985 (35 yrs)  Name:       Argentina Ponder                 Visit Date: 11/24/2021 01:55 pm ---------------------------------------------------------------------- Performed By  Attending:        Tama High MD        Ref. Address:     [address]                                                             [place], [place]  Performed By:     Marye Round Pharisien     [facility] at                                                             [place] for                                                             Women  Referred By:      [facility]                    for Women ---------------------------------------------------------------------- Orders  #  Description                           Code        Ordered By  1  [place] MFM OB LIMITED                     [id number]    YU FANG ----------------------------------------------------------------------  #  Order #                     Accession #                Episode #  1  [id number]                   [id number]                 [id number] ---------------------------------------------------------------------- Indications  Premature rupture of membranes - leaking       O42.90  fluid  Oligohydraminios, second trimester,            O41.02X0  unspecified  Abnormal biochemical finding on antenatal      O28.1  screening of mother (panorama increased  risk  triploidy  Poor obstetric history: Previous preeclampsia  O09.299  Poor obstetric history: Previous preterm       O09.219  delivery, antepartum  Hypertension - Chronic/Pre-existing            B51.025  Obesity complicating pregnancy, second         O99.212  trimester  Gestational diabetes in pregnancy,             O24.415  controlled by oral hypoglycemic drugs  (metformin)  Medical complication 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 Cord Insertion:      Previously Visualized  Amniotic Fluid  AFI FV:      Oligohydramnios  Largest Pocket(cm)                              1.26 ---------------------------------------------------------------------- Biometry  CER:        22  mm     G. Age:  20w 5d         50  %  LV:        5.4  mm  CM:        2.8  mm ---------------------------------------------------------------------- OB History  Gravidity:    2         Term:   1  Living:       1 ---------------------------------------------------------------------- Gestational Age  LMP:           21w 0d        Date:  06/30/21                 EDD:   04/06/22  Best:          Audrea Muscat 0d     Det. By:  LMP  (06/30/21)          EDD:   04/06/22 ---------------------------------------------------------------------- Anatomy  Cranium:               Appears normal         Posterior Fossa:        Appears normal  Cavum:                 Appears normal         Stomach:                Present but small  Ventricles:            Appears normal         Kidneys:                Appear  normal  Cerebellum:            Appears normal         Bladder:                Appears normal  Other:  Technically difficult due to low amniotic fluid. ---------------------------------------------------------------------- Cervix Uterus Adnexa  Cervix  Length:           3.89  cm.  Normal appearance by transabdominal scan.  Uterus  Normal shape and size.  Right Ovary  Not visualized.  Left Ovary  Not visualized.  Cul De Sac  No free fluid seen.  Adnexa  No adnexal mass visualized. ---------------------------------------------------------------------- Impression  Previable PPROM. Patient returned for amniotic fluid  assessment.  Oligohydramnios is seen again.  Good fetal heart activity is  present.  Cephalic presentation.  She opted to continue expectant management.  Patient will  be admitted around [redacted] weeks gestation. ---------------------------------------------------------------------- Recommendations  -Fetal growth next week. ----------------------------------------------------------------------                  Tama High, MD Electronically Signed Final Report   11/24/2021 03:11 pm ----------------------------------------------------------------------  [place] MFM OB LIMITED  Result Date: 11/18/2021 ----------------------------------------------------------------------  OBSTETRICS REPORT                       (Signed Final 11/18/2021 03:59 pm) ---------------------------------------------------------------------- Patient Info  ID #:       [id number]  D.O.B.:  03-19-1986 (35 yrs)  Name:       AHMYAH GIDLEY                 Visit Date: 11/18/2021 02:36 pm ---------------------------------------------------------------------- Performed By  Attending:        Johnell Comings MD         Ref. Address:     [address]                                                             [place], [place]  Performed By:     Marye Round Pharisien      [facility]         [facility]                                     [facility] at                                                             [place] for                                                             Women  Referred By:      [facility] ---------------------------------------------------------------------- Orders  #  Description                           Code        Ordered By  1  [place] MFM OB LIMITED                     [id number]    Peterson Ao ----------------------------------------------------------------------  #  Order #                     Accession #                Episode #  1  [id number]                   [id number]                 [id number] ---------------------------------------------------------------------- Indications  Premature rupture of membranes - leaking  O42.90  fluid  Oligohydraminios, second trimester,            O41.02X0  unspecified  Abnormal biochemical finding on antenatal      O28.1  screening of mother (panorama increased  risk triploidy  Poor obstetric history: Previous preeclampsia  O09.299  Poor obstetric history: Previous preterm       O09.219  delivery, antepartum  Hypertension - Chronic/Pre-existing            L93.570  Obesity complicating pregnancy, second         O99.212  trimester  Gestational diabetes in pregnancy,             O24.415  controlled by oral hypoglycemic drugs  (metformin)  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 Cord  Insertion:      Previously Visualized  Amniotic Fluid  AFI FV:      Oligohydramnios                              Largest Pocket(cm)                              1.41 ---------------------------------------------------------------------- Biometry  LV:        4.3  mm ---------------------------------------------------------------------- OB History  Gravidity:    2         Term:   1  Living:       1 ---------------------------------------------------------------------- Gestational Age  LMP:           20w 1d        Date:  06/30/21                 EDD:   04/06/22  Best:          20w 1d     Det. By:  LMP  (06/30/21)          EDD:   04/06/22 ---------------------------------------------------------------------- Anatomy  Cranium:               Appears normal         Aortic Arch:            Previously seen  Cavum:                 Appears normal         Ductal Arch:            Color Flow  Previously seen  Ventricles:            Appears normal         Diaphragm:              Appears normal  Choroid Plexus:        Appears normal         Stomach:                Absence of fluid                                                                        filled stomach  Cerebellum:            Previously seen        Abdomen:                Previously seen  Posterior Fossa:       Appears normal         Abdominal Wall:         Previously seen  Nuchal Fold:           Not applicable (>66    Cord Vessels:           Appears normal ([redacted]                         wks GA)                                        vessel cord)  Face:                  Profile appears        Kidneys:                Appear normal                         normal;orbits prev                         vis  Lips:                  Not well visualized    Bladder:                Appears normal  Thoracic:              Appears normal         Spine:                  Previously seen  Heart:                 Appears  normal         Upper Extremities:      Previously seen                         (4CH, axis, and                         situs)  RVOT:                  Appears normal         Lower Extremities:      Previously seen  LVOT:                  Not well visualized  Other:  VC, 3VV and 3VTV visualized. Heels/feet and open hands/5th digits          prev visualized. Patient does not want to know gender but appears          wnl. Technically difficult due to low amniotic fluid. ---------------------------------------------------------------------- Doppler - Fetal Vessels  Umbilical Artery   S/D     %tile      RI    %tile      PI    %tile            ADFV    RDFV   3.13       13    0.68       14    1.14       26               No      No ---------------------------------------------------------------------- Cervix Uterus Adnexa  Cervix  Normal appearance by transabdominal scan.  Uterus  Normal shape and size.  Right Ovary  Not visualized.  Left Ovary  Not visualized.  Cul De Sac  No free fluid seen.  Adnexa  No adnexal mass visualized. ---------------------------------------------------------------------- Comments  Jaala Dain was seen for follow-up ultrasound exam due to  PPROM that occurred last week. She reports continued  leakage of fluid over the past week.  She denies any signs or  symptoms of an intrauterine infection.  Oligohydramnios with a total AFI of 1.4 cm was noted on  today's exam.  A posterior placenta previa continues to be  noted.  The poor prognosis for her pregnancy due to PPROM at a  very early gestational age and due to oligohydramnios was  discussed again today.  The patient has decided to continue  her pregnancy and would like [place] to do everything possible to  help her achieve the best outcome for her baby.  She understands that due to oligohydramnios at her early  gestational age, there is an increased risk for fetal  contractures and fetal pulmonary hypoplasia even if she  delivers at viability or  beyond.  The increased risk of an  intrauterine infection, IUFD, and spontaneous labor due to  PPROM was discussed.  Should she remain undelivered with a viable fetus at 22  weeks and 5 days, a course of antenatal corticosteroids  should be administered at that time and consideration may  be given to continued inpatient management until delivery.  The patient was advised to continue to monitor for signs and  symptoms of an intrauterine infection such as fevers, shaking  chills, and a foul-smelling discharge.  She understands to that  she should go to the hospital should she have any signs or  symptoms of an intrauterine infection or signs of labor.  We will continue to follow her with weekly ultrasound exams  (as an outpatient) for fetal assessment until she reaches 22  weeks and 5 days.  She will return in 1 week for another ultrasound exam for fetal  assessment.  The patient stated that all of her questions have been  answered today.  A total of 15 she minutes was spent counseling  and  coordinating the care for this patient.  Greater than 50% of  the time was spent in direct face-to-face contact. ----------------------------------------------------------------------                   Johnell Comings, MD Electronically Signed Final Report   11/18/2021 03:59 pm ----------------------------------------------------------------------  [place] MFM OB LIMITED  Result Date: 11/09/2021 ----------------------------------------------------------------------  OBSTETRICS REPORT                       (Signed Final 11/09/2021 09:01 pm) ---------------------------------------------------------------------- Patient Info  ID #:       [id number]                          D.O.B.:  July 10, 1986 (35 yrs)  Name:       Argentina Ponder                 Visit Date: 11/09/2021 06:58 pm ---------------------------------------------------------------------- Performed By  Attending:        Tama High MD        Ref. Address:      [address]                                                               [place], [place]  Performed By:     Hubert Azure          [facility]                    [place]  Referred By:      [facility] ---------------------------------------------------------------------- Orders  #  Description                           Code        Ordered By  1  [place] MFM OB LIMITED                     [id number]    NICOLE NUGENT ----------------------------------------------------------------------  #  Order #                     Accession #                Episode #  1  [id number]                   [id number]  [id number] ---------------------------------------------------------------------- Indications  [redacted] weeks gestation of pregnancy                 Z3A.18  Premature rupture of membranes - leaking        O42.90  fluid  Pelvic pain affecting pregnancy in second       O26.892  trimester ---------------------------------------------------------------------- Fetal Evaluation  Num Of Fetuses:          1  Fetal Heart Rate(bpm):   145  Cardiac Activity:        Observed  Presentation:            Transverse, head to maternal left  Placenta:                Posterior Previa  Amniotic Fluid  AFI FV:      Within normal limits                              Largest Pocket(cm)                              3.4  Comment:    ?subchorionic hematoma (5.9X2.3cm) ---------------------------------------------------------------------- OB History  Gravidity:    2         Term:   1  Living:       1 ---------------------------------------------------------------------- Gestational Age  LMP:           18w 6d        Date:  06/30/21                 EDD:   04/06/22  Best:          18w 6d     Det. By:  LMP  (06/30/21)          EDD:   04/06/22 ----------------------------------------------------------------------  Cervix Uterus Adnexa  Cervix  Length:           3.62  cm.  Normal appearance by transabdominal scan.  Uterus  No abnormality visualized.  Right Ovary  Not visualized.  Left Ovary  Not visualized.  Adnexa  No abnormality visualized. ---------------------------------------------------------------------- Impression  Patient was evaluated at the MAU for c/o leakage of amniotic  fluid. No history of vaginal bleeding.  A limited ultrasound study was performed .Amniotic fluid is  normal and good fetal activity is seen .Placenta previa is  seen. An irregular area at the inferior edge of placenta  (somewhat obscured by overlying fetus) measuring 5.9 x 2.3  cm, is seen. This could represent a subchorionic hematoma. ---------------------------------------------------------------------- Recommendations  -Fetal anatomy scan and transvaginal evaluation tomorrow at  [facility]. ----------------------------------------------------------------------                  Tama High, MD Electronically Signed Final Report   11/09/2021 09:01 pm ----------------------------------------------------------------------    Assessment and Plan: Patient Active Problem List   Diagnosis Date Noted   Premature rupture of membranes 12/08/2021   BMI 38.0-38.9,adult 11/15/2021   Abnormal genetic test during pregnancy 11/15/2021   Type 2 diabetes mellitus without complication, without long-term current use of insulin ([place]) 11/15/2021   Preterm premature rupture of membranes in second trimester 11/14/2021   Placenta previa 11/09/2021   Red Chart Rounds Patient 10/20/2021   Proteinuria affecting pregnancy, antepartum 09/17/2021   Rubella non-immune status, antepartum 09/16/2021   Anemia affecting pregnancy in first trimester 09/16/2021   History of severe preeclampsia, prior  pregnancy, currently pregnant 09/15/2021   Previous cesarean delivery, antepartum 09/15/2021   Migraine headache 08/04/2020   Benign gestational thrombocytopenia in  second trimester ([place]) 07/15/2020   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 She may need insulin coverage as she just received betamethasone. She will be hospitalized until delivery  Emeterio Reeve, MD Attending physician Faculty Practice, [facility]

## 2021-12-09 LAB — GLUCOSE, CAPILLARY
Glucose-Capillary: 245 mg/dL — ABNORMAL HIGH (ref 70–99)
Glucose-Capillary: 151 mg/dL — ABNORMAL HIGH (ref 70–99)
Glucose-Capillary: 141 mg/dL — ABNORMAL HIGH (ref 70–99)
Glucose-Capillary: 149 mg/dL — ABNORMAL HIGH (ref 70–99)
Glucose-Capillary: 161 mg/dL — ABNORMAL HIGH (ref 70–99)

## 2021-12-09 MED ORDER — INSULIN ASPART 100 UNIT/ML IJ SOLN: 20.0000 [IU] | Freq: Once | INTRAMUSCULAR | Status: DC

## 2021-12-09 MED ORDER — INSULIN GLARGINE-YFGN 100 UNIT/ML ~~LOC~~ SOLN
18.0000 [IU] | Freq: Two times a day (BID) | SUBCUTANEOUS | Status: DC
Start: 2021-12-09 — End: 2021-12-10
  Administered 2021-12-09 (×2): 18 [IU] via SUBCUTANEOUS
  Filled 2021-12-09 (×4): qty 0.18

## 2021-12-09 MED ORDER — INFLUENZA VAC SPLIT QUAD 0.5 ML IM SUSY
0.5000 mL | PREFILLED_SYRINGE | INTRAMUSCULAR | Status: AC
  Administered 2021-12-09: 0.5 mL via INTRAMUSCULAR
  Filled 2021-12-09: qty 0.5

## 2021-12-09 MED ORDER — INSULIN ASPART 100 UNIT/ML IJ SOLN
22.0000 [IU] | Freq: Three times a day (TID) | INTRAMUSCULAR | Status: DC
  Administered 2021-12-09 (×2): 22 [IU] via SUBCUTANEOUS

## 2021-12-09 MED ORDER — INSULIN ASPART 100 UNIT/ML IJ SOLN: 0.0000 [IU] | Freq: Three times a day (TID) | INTRAMUSCULAR | Status: DC

## 2021-12-09 MED ORDER — INSULIN ASPART 100 UNIT/ML IJ SOLN
14.0000 [IU] | Freq: Once | INTRAMUSCULAR | Status: AC
  Administered 2021-12-09: 14 [IU] via SUBCUTANEOUS

## 2021-12-09 MED ORDER — INSULIN ASPART 100 UNIT/ML IJ SOLN
0.0000 [IU] | Freq: Three times a day (TID) | INTRAMUSCULAR | Status: DC
  Administered 2021-12-09: 4 [IU] via SUBCUTANEOUS
  Administered 2021-12-09: 6 [IU] via SUBCUTANEOUS
  Administered 2021-12-09: 4 [IU] via SUBCUTANEOUS

## 2021-12-09 NOTE — Progress Notes (Addendum)
Inpatient Diabetes Program Recommendations  Diabetes Treatment Program Recommendations  ADA Standards of Care Diabetes in Pregnancy Target Glucose Ranges:  Fasting: 70 - 95 mg/dL 1 hr postprandial: Less than 140mg /dL (from first bite of meal) 2 hr postprandial: Less than 120 mg/dL (from first bite of meal)    Lab Results  Component Value Date   GLUCAP 245 (H) 12/09/2021   HGBA1C 7.4 (H) 09/15/2021    Review of Glycemic Control  Latest Reference Range & Units 12/08/21 20:18 12/09/21 05:37  Glucose-Capillary 70 - 99 mg/dL 355 (H) 245 (H)  (H): Data is abnormally high Diabetes history: Type 2 DM Outpatient Diabetes medications: Lantus 14 units BID, Metformin 1000 mg BID, Novolog 18 units TID Prior to pregnancy: Metformin 1000 mg BID, Glipizide 10 mg BID Current orders for Inpatient glycemic control: Semglee 18 units BID, Metformin 1000 mg BID, Novolog 22 units TID BMZ x 1 (2)  Inpatient Diabetes Program Recommendations:    Assuming elevated partially due to steroid administration.  Consider adding Novolog 0-16 units TID & HS. Could also further increase Semglee to 24 units BID. Secure chat sent to MD. Will plan to see.   Addendum: Spoke with patient regarding outpatient diabetes management and plan while remaining inpatient until delivery.  Reviewed patient's current A1c of 7.4%. Explained what a A1c is and what it measures. Also reviewed goal A1c with patient, importance of good glucose control @ home, and blood sugar goals. Reviewed patho of DM in pregnancy, hormonal fluctuations impacting insulin needs, insulin resistance, need for further titration, impact of steroids to glucose trends, vascular changes, and commorbidities.  Patient could benefit from [facility] while inpatient. Given recent administration of BMZ will hold off with placement/obtaining orders until 2/27. Discussed with patient.  At this time, patient has no further questions.    Thanks, Bronson Curb,  MSN, RNC-OB Diabetes Coordinator [phone] (8a-5p)

## 2021-12-10 ENCOUNTER — Encounter (HOSPITAL_COMMUNITY): Payer: Self-pay | Admitting: Obstetrics & Gynecology

## 2021-12-10 LAB — CULTURE, BETA STREP (GROUP B ONLY)

## 2021-12-10 LAB — GLUCOSE, CAPILLARY
Glucose-Capillary: 146 mg/dL — ABNORMAL HIGH (ref 70–99)
Glucose-Capillary: 139 mg/dL — ABNORMAL HIGH (ref 70–99)
Glucose-Capillary: 135 mg/dL — ABNORMAL HIGH (ref 70–99)
Glucose-Capillary: 136 mg/dL — ABNORMAL HIGH (ref 70–99)
Glucose-Capillary: 208 mg/dL — ABNORMAL HIGH (ref 70–99)
Glucose-Capillary: 138 mg/dL — ABNORMAL HIGH (ref 70–99)

## 2021-12-10 MED ORDER — INSULIN GLARGINE-YFGN 100 UNIT/ML ~~LOC~~ SOLN
20.0000 [IU] | Freq: Two times a day (BID) | SUBCUTANEOUS | Status: DC
Start: 2021-12-10 — End: 2021-12-11
  Administered 2021-12-10 (×2): 20 [IU] via SUBCUTANEOUS
  Filled 2021-12-10 (×2): qty 0.2

## 2021-12-10 MED ORDER — ONDANSETRON HCL 4 MG/2ML IJ SOLN
4.0000 mg | Freq: Four times a day (QID) | INTRAMUSCULAR | Status: DC | PRN
  Administered 2021-12-10: 4 mg via INTRAVENOUS
  Filled 2021-12-10: qty 2

## 2021-12-10 MED ORDER — INSULIN ASPART 100 UNIT/ML IJ SOLN
24.0000 [IU] | Freq: Three times a day (TID) | INTRAMUSCULAR | Status: DC
  Administered 2021-12-10 (×3): 24 [IU] via SUBCUTANEOUS

## 2021-12-10 MED ORDER — INSULIN ASPART 100 UNIT/ML IJ SOLN
0.0000 [IU] | Freq: Three times a day (TID) | INTRAMUSCULAR | Status: DC
  Administered 2021-12-10: 9 [IU] via SUBCUTANEOUS
  Administered 2021-12-10 (×2): 4 [IU] via SUBCUTANEOUS
  Administered 2021-12-11 (×2): 6 [IU] via SUBCUTANEOUS
  Administered 2021-12-12: 4 [IU] via SUBCUTANEOUS
  Administered 2021-12-13: 6 [IU] via SUBCUTANEOUS
  Administered 2021-12-13 – 2021-12-15 (×3): 3 [IU] via SUBCUTANEOUS
  Administered 2021-12-15: 6 [IU] via SUBCUTANEOUS
  Administered 2021-12-15: 3 [IU] via SUBCUTANEOUS
  Administered 2021-12-16: 4 [IU] via SUBCUTANEOUS
  Administered 2021-12-16: 3 [IU] via SUBCUTANEOUS
  Administered 2021-12-17 (×2): 4 [IU] via SUBCUTANEOUS
  Administered 2021-12-18: 3 [IU] via SUBCUTANEOUS
  Administered 2021-12-18: 6 [IU] via SUBCUTANEOUS
  Administered 2021-12-19: 3 [IU] via SUBCUTANEOUS

## 2021-12-10 NOTE — Progress Notes (Signed)
Patient complains of weakness/shaking BLE, tingling of right hand. Patient stated history of Stroke 2018 and TIA 2021. BE FAST assessment performed and WNL. Vitals WNL. Patient stated feeling "too weak to stand and walk" but stated she had to use the restroom. Patient was transferred to bathroom using the STEDY. Dr. Kennon Rounds called and updated at 1623.

## 2021-12-10 NOTE — Progress Notes (Signed)
Patient ID: Joann Meyers, female   DOB: 1986/06/11, 36 y.o.   MRN: [id number] [place]) NOTE  Joann Meyers is a 36 y.o. G2P0101 with Estimated Date of Delivery: 04/06/22   By  early ultrasound [redacted]w[redacted]d  who is admitted for PROM at 18 weeks, posterior previa, Class B DM  Fetal presentation is breech. Length of Stay:  2  Days  Date of admission:12/08/2021  Subjective: No complaints Patient reports the fetal movement as active. Patient reports uterine contraction  activity as none. Patient reports  vaginal bleeding as none. Patient describes fluid per vagina as Clear.  Vitals:  Blood pressure (!) 123/59, pulse 84, temperature 98.3 F (36.8 C), temperature source Oral, resp. rate 16, height 5\' 2"  (1.575 m), weight 93.9 kg, last menstrual period 06/30/2021, SpO2 100 %, not currently breastfeeding. Vitals:   12/09/21 1625 12/09/21 2010 12/09/21 2359 12/10/21 0314  BP: 129/63 130/63 118/61 (!) 123/59  Pulse: 85 81 85 84  Resp: 17 20 18 16   Temp: 98.1 F (36.7 C) 98.5 F (36.9 C) 98.2 F (36.8 C) 98.3 F (36.8 C)  TempSrc: Oral Oral Oral Oral  SpO2: 100% 100% 99% 100%  Weight:      Height:       Physical Examination:  General appearance - alert, well appearing, and in no distress Abdomen - soft, nontender, nondistended, no masses or organomegaly Fundal Height:  size equals dates Pelvic Exam:  examination not indicated Cervical Exam: Not evaluated.  Extremities: extremities normal, atraumatic, no cyanosis or edema with DTRs 2+ bilaterally Membranes:ruptured, clear fluid  Fetal Monitoring:  Baseline: 150S bpm and Decelerations: Absent   APPROPRIATE FOR ga  Labs:  Results for orders placed or performed during the hospital encounter of 12/08/21 (from the past 24 hour(s))  Glucose, capillary   Collection Time: 12/09/21 10:43 AM  Result Value Ref Range   Glucose-Capillary 151 (H) 70 - 99 mg/dL  Glucose, capillary   Collection Time: 12/09/21  1:02 PM   Result Value Ref Range   Glucose-Capillary 141 (H) 70 - 99 mg/dL  Glucose, capillary   Collection Time: 12/09/21  4:22 PM  Result Value Ref Range   Glucose-Capillary 149 (H) 70 - 99 mg/dL  Glucose, capillary   Collection Time: 12/09/21  9:04 PM  Result Value Ref Range   Glucose-Capillary 161 (H) 70 - 99 mg/dL  Glucose, capillary   Collection Time: 12/10/21  5:39 AM  Result Value Ref Range   Glucose-Capillary 146 (H) 70 - 99 mg/dL    Imaging Studies:    [place] MFM OB LIMITED  Result Date: 12/07/2021 ----------------------------------------------------------------------  OBSTETRICS REPORT                       (Signed Final 12/07/2021 01:53 pm) ---------------------------------------------------------------------- Patient Info  ID #:       [id number]                          D.O.B.:  09/02/1986 (36 yrs)  Name:       Joann Meyers                 Visit Date: 12/07/2021 12:52 pm ---------------------------------------------------------------------- Performed By  Attending:        Johnell Comings MD         Ref. Address:     [address]  [place], [place]  Performed By:     Lelan Pons RDMS       [facility]         [facility]                                                             [place] for                                                             Women  Referred By:      [facility] ---------------------------------------------------------------------- Orders  #  Description                           Code        Ordered By  1  [place] MFM OB LIMITED                     [id number]    YU FANG ----------------------------------------------------------------------  #  Order #                     Accession #                Episode #  1  [id number]                   [id number]                  [id number] ---------------------------------------------------------------------- Indications  Premature rupture of membranes - leaking       O42.90  fluid  Decreased fetal movement                       O36.8190  Abnormal biochemical finding on antenatal      O28.1  screening of mother (panorama increased  risk triploidy  Poor obstetric history: Previous preeclampsia  O09.299  Poor obstetric history: Previous preterm       O09.219  delivery, antepartum  Hypertension - Chronic/Pre-existing            O15.019  Advanced maternal age multigravida 57+,        O90.522  second trimester (36 yrs)  Obesity complicating pregnancy, second  J18.841  trimester(BMI 32)  Gestational diabetes in pregnancy,             O24.415  controlled by oral hypoglycemic drugs  (metformin)  Medical complication of pregnancy (previous    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 Cord Insertion:      Previously Visualized  Amniotic Fluid  AFI FV:      Anhydramnios ---------------------------------------------------------------------- Biometry  LV:        4.7  mm ---------------------------------------------------------------------- OB History  Gravidity:    2         Term:   1  Living:       1 ---------------------------------------------------------------------- Gestational Age  LMP:           22w 6d        Date:  06/30/21                 EDD:   04/06/22  Best:          Kathrene Bongo 6d     Det. By:  LMP  (06/30/21)          EDD:   04/06/22 ----------------------------------------------------------------------  Anatomy  Stomach:               Not well visualized    Bladder:                Small ---------------------------------------------------------------------- Cervix Uterus Adnexa  Cervix  Normal appearance by transabdominal scan.  Right Ovary  Not visualized.  Left Ovary  Visualized. ---------------------------------------------------------------------- Comments  Joann Meyers was seen for a follow-up ultrasound due to  PPROM that occurred at 18+ weeks. She is now 22 weeks  and 6 days pregnant. She denies any signs or symptoms of  an intrauterine infection.  She had a growth scan last week that showed an overall  EFW of 1 pound 1 ounces, 481 g, 52nd percentile for her  gestational age.  A viable singleton intrauterine gestation with anhydramnios  was noted today.  Fetal movements were noted during  today's exam. The fetus was in the breech presentation.  A posterior placenta previa continues to be noted.  The poor prognosis for her pregnancy due to PPROM at a  very early gestational age and due to anhydramnios was  discussed again today.  She understands that due to oligohydramnios/anhydramnios  at her early gestational age, there is an increased risk for  fetal contractures and fetal pulmonary hypoplasia even if she  delivers at viability or beyond.  As the patient would like [place] to do everything possible to help  her achieve the best outcome for her baby, she will be  admitted tomorrow when she is 23 weeks for continued  inpatient management until delivery. She has already  received a complete course of antenatal corticosteroids  yesterday and today in our office.  While hospitalized, she should receive daily fetal testing.  She  should have another ultrasound to assess the fetal  growth and placental location in 3 weeks.  The patient understands that the goal for her delivery would  be at around 34 weeks.  However, delivery prior to 34 weeks would be indicated  should she go into spontaneous labor, should she show  any  signs of an intrauterine infection, or at any time for  nonreassuring fetal status.  She plans on being admitted to the hospital at around 11 am  to 12:00 pm tomorrow.  The patient stated that all of her questions have been  answered today.  A total of 20 she minutes was spent counseling and  coordinating the care for this patient.  Greater than 50% of  the time was spent in direct face-to-face contact. ----------------------------------------------------------------------                   Joann Comings, MD Electronically Signed Final Report   12/07/2021 01:53 pm ----------------------------------------------------------------------  [place] MFM OB LIMITED  Result Date: 12/06/2021 ----------------------------------------------------------------------  OBSTETRICS REPORT                       (Signed Final 12/06/2021 01:51 pm) ---------------------------------------------------------------------- Patient Info  ID #:       [id number]                          D.O.B.:  Jul 20, 1986 (36 yrs)  Name:       Joann Meyers                 Visit Date: 12/06/2021 11:14 am ---------------------------------------------------------------------- Performed By  Attending:        Johnell Comings MD         Ref. Address:     [address]                                                             [place], [place]  Performed By:     Jeanene Erb BS,      [facility]         [facility]                                     Fetal Care at                                                             [place] for  Women  Referred By:      [facility] ---------------------------------------------------------------------- Orders  #  Description                           Code        Ordered By  1  [place] MFM OB LIMITED                     [phone]    YU FANG  ----------------------------------------------------------------------  #  Order #                     Accession #                Episode #  1  [id number]                   [id number]                 [id number] ---------------------------------------------------------------------- Indications  Premature rupture of membranes - leaking       O42.90  fluid  Decreased fetal movement                       O36.8190  Abnormal biochemical finding on antenatal      O28.1  screening of mother (panorama increased  risk triploidy  Poor obstetric history: Previous preeclampsia  O09.299  Poor obstetric history: Previous preterm       O09.219  delivery, antepartum  Hypertension - Chronic/Pre-existing            O45.019  Advanced maternal age multigravida 74+,        O27.522  second trimester (52 yrs)  Obesity complicating pregnancy, second         O99.212  trimester(BMI 58)  Gestational diabetes in pregnancy,             O24.415  controlled by oral hypoglycemic drugs  (metformin)  Medical complication of pregnancy (previous    O26.90  carotid artery dissection/history TIA and  CVA)  Medium Chain Acyl-CoA Dehydrogenas  Deficiency  Medical complication of pregnancy (seizures)   O26.90  [redacted] weeks gestation of pregnancy                Z3A.22 ---------------------------------------------------------------------- Fetal Evaluation  Num Of Fetuses:         1  Fetal Heart Rate(bpm):  143  Cardiac Activity:       Observed  Presentation:           Cephalic  Placenta:               Posterior Previa  P. Cord Insertion:      Previously Visualized  Amniotic Fluid  AFI FV:      Anhydramnios ---------------------------------------------------------------------- OB History  Gravidity:    2         Term:   1  Living:       1 ---------------------------------------------------------------------- Gestational Age  LMP:           22w 5d        Date:  06/30/21                 EDD:   04/06/22  Best:          22w 5d     Det. By:  LMP  (06/30/21)          EDD:    04/06/22 ---------------------------------------------------------------------- Anatomy  Stomach:               Not well visualized    Bladder:                Appears normal  Cord Vessels:          Appears normal (3                         vessel cord) ---------------------------------------------------------------------- Doppler - Fetal Vessels  Umbilical Artery   S/D     %tile      RI    %tile      PI    %tile            ADFV    RDFV   3.23       29    0.69       34    1.12       41               No      No ---------------------------------------------------------------------- Comments  Tambra Belflower is currently at 22 weeks and 5 days following  PPROM at 18+ weeks.  She returns today to receive a complete course of antenatal  corticosteroids.  A limited ultrasound performed today shows a viable  singleton gestation with anhydramnios.  The fetal heart rate  was 143 bpm.  Fetal movements were noted throughout  today's exam.  We will plan on administering a complete course of antenatal  corticosteroids today and tomorrow.  She will be admitted for inpatient management until delivery  at 23 weeks (in 2 days). ----------------------------------------------------------------------                   Joann Comings, MD Electronically Signed Final Report   12/06/2021 01:51 pm ----------------------------------------------------------------------    Medications:  Scheduled  amoxicillin  500 mg Oral TID   aspirin EC  81 mg Oral Daily   docusate sodium  100 mg Oral Daily   insulin aspart  0-24 Units Subcutaneous TID PC   insulin aspart  24 Units Subcutaneous TID WC   insulin glargine-yfgn  20 Units Subcutaneous BID   labetalol  300 mg Oral BID   metFORMIN  1,000 mg Oral BID WC   NIFEdipine  30 mg Oral Daily   pantoprazole  40 mg Oral Daily   prenatal vitamin w/FE, FA  1 tablet Oral Q1200   I have reviewed the patient's current medications.  ASSESSMENT: G2P0101 [redacted]w[redacted]d Estimated Date of Delivery: 04/06/22   PPROM at 18 weeks Class B DM History of TIA Posterior previa Breech presentation  PLAN: >S/P BMZ x 2: 2/21, 2/22 >on latency antibiotics >CBG closer to desired range, smaller bump this am in insulin    [place] 12/10/2021,7:11 AM

## 2021-12-10 NOTE — Progress Notes (Incomplete)
Patient stated weakness and shaking upon standing and walking. Patient stated that she lowered herself to the floor as she thought she might fall. She stated that she then got up and went back to bed. Patient reported this to me while lying in bed and reported incident was not witnessed by staff. Vitals WNL, CBG 136. EFM monitors placed on patient.

## 2021-12-11 ENCOUNTER — Inpatient Hospital Stay (HOSPITAL_COMMUNITY): Payer: Medicaid Other

## 2021-12-11 DIAGNOSIS — O4400 Placenta previa specified as without hemorrhage, unspecified trimester: Secondary | ICD-10-CM

## 2021-12-11 DIAGNOSIS — O24319 Unspecified pre-existing diabetes mellitus in pregnancy, unspecified trimester: Secondary | ICD-10-CM | POA: Diagnosis not present

## 2021-12-11 LAB — GLUCOSE, CAPILLARY
Glucose-Capillary: 166 mg/dL — ABNORMAL HIGH (ref 70–99)
Glucose-Capillary: 162 mg/dL — ABNORMAL HIGH (ref 70–99)
Glucose-Capillary: 75 mg/dL (ref 70–99)
Glucose-Capillary: 151 mg/dL — ABNORMAL HIGH (ref 70–99)
Glucose-Capillary: 191 mg/dL — ABNORMAL HIGH (ref 70–99)

## 2021-12-11 MED ORDER — INSULIN ASPART 100 UNIT/ML IJ SOLN
28.0000 [IU] | Freq: Three times a day (TID) | INTRAMUSCULAR | Status: DC
  Administered 2021-12-11 (×3): 28 [IU] via SUBCUTANEOUS

## 2021-12-11 MED ORDER — INSULIN GLARGINE-YFGN 100 UNIT/ML ~~LOC~~ SOLN
24.0000 [IU] | Freq: Two times a day (BID) | SUBCUTANEOUS | Status: DC
  Administered 2021-12-11 (×2): 24 [IU] via SUBCUTANEOUS
  Filled 2021-12-11 (×3): qty 0.24

## 2021-12-11 NOTE — Progress Notes (Signed)
Patient ID: Joann Meyers, female   DOB: October 14, 1986, 36 y.o.   MRN: [id number] [place]) NOTE  Joann Meyers is a 36 y.o. G2P0101 with Estimated Date of Delivery: 04/06/22   By  early ultrasound [redacted]w[redacted]d  who is admitted for PROM at 18 weeks, posterior previa, Class B DM  Fetal presentation is breech. Length of Stay:  3  Days  Date of admission:12/08/2021  Subjective: No complaints Had a weakness episode yesterday but was short lived resolved BE FAST was negative, no further episodes Patient reports the fetal movement as active. Patient reports uterine contraction  activity as none. Patient reports  vaginal bleeding as none. Patient describes fluid per vagina as Clear.  Vitals:  Blood pressure 133/66, pulse 86, temperature 98.1 F (36.7 C), temperature source Oral, resp. rate 20, height 5\' 2"  (1.575 m), weight 93.9 kg, last menstrual period 06/30/2021, SpO2 97 %, not currently breastfeeding. Vitals:   12/10/21 1620 12/10/21 1946 12/10/21 2301 12/11/21 0336  BP: 131/71 140/68 130/61 133/66  Pulse: 89 90 85 86  Resp: 17 16 20 20   Temp: 98.4 F (36.9 C) 98.6 F (37 C) 98.3 F (36.8 C) 98.1 F (36.7 C)  TempSrc: Oral Oral Oral Oral  SpO2: 97% 98% 97% 97%  Weight:      Height:       Physical Examination:  General appearance - alert, well appearing, and in no distress Abdomen - soft, nontender, nondistended, no masses or organomegaly Fundal Height:  size equals dates Pelvic Exam:  examination not indicated Cervical Exam: Not evaluated.  Extremities: extremities normal, atraumatic, no cyanosis or edema with DTRs 2+ bilaterally Membranes:ruptured, clear fluid  Fetal Monitoring:  Baseline: 150S bpm and Decelerations: Absent   APPROPRIATE FOR ga  Labs:  Results for orders placed or performed during the hospital encounter of 12/08/21 (from the past 24 hour(s))  Glucose, capillary   Collection Time: 12/10/21  9:03 AM  Result Value Ref Range    Glucose-Capillary 139 (H) 70 - 99 mg/dL  Glucose, capillary   Collection Time: 12/10/21 11:13 AM  Result Value Ref Range   Glucose-Capillary 135 (H) 70 - 99 mg/dL  Glucose, capillary   Collection Time: 12/10/21  3:32 PM  Result Value Ref Range   Glucose-Capillary 136 (H) 70 - 99 mg/dL  Glucose, capillary   Collection Time: 12/10/21  9:00 PM  Result Value Ref Range   Glucose-Capillary 208 (H) 70 - 99 mg/dL  Glucose, capillary   Collection Time: 12/10/21 11:09 PM  Result Value Ref Range   Glucose-Capillary 138 (H) 70 - 99 mg/dL  Glucose, capillary   Collection Time: 12/11/21  6:01 AM  Result Value Ref Range   Glucose-Capillary 166 (H) 70 - 99 mg/dL    Imaging Studies:    [place] MFM OB LIMITED  Result Date: 12/07/2021 ----------------------------------------------------------------------  OBSTETRICS REPORT                       (Signed Final 12/07/2021 01:53 pm) ---------------------------------------------------------------------- Patient Info  ID #:       [id number]                          D.O.B.:  May 01, 1986 (36 yrs)  Name:       Joann Meyers                 Visit Date: 12/07/2021 12:52 pm ---------------------------------------------------------------------- Performed By  Attending:  Johnell Comings MD         Ref. Address:     [address]                                                             [place], [place]  Performed By:     Lelan Pons RDMS       [facility]                                                             [place] [facility]                                                             [facility]  Referred By:      [facility]                    for [facility] ---------------------------------------------------------------------- Orders  #  Description                           Code        Ordered By  1  [place] MFM OB  LIMITED                     [phone]    Peterson Ao ----------------------------------------------------------------------  #  Order #                     Accession #                Episode #  1  [id number]                   [id number]                 [id number] ---------------------------------------------------------------------- Indications  Premature rupture of membranes - leaking       O42.90  fluid  Decreased fetal movement                       O36.8190  Abnormal biochemical finding on antenatal  O28.1  screening of mother (panorama increased  risk triploidy  Poor obstetric history: Previous preeclampsia  O09.299  Poor obstetric history: Previous preterm       O09.219  delivery, antepartum  Hypertension - Chronic/Pre-existing            O43.019  Advanced maternal age multigravida 47+,        O61.522  second trimester (89 yrs)  Obesity complicating pregnancy, second         O99.212  trimester(BMI 38)  Gestational diabetes in pregnancy,             O24.415  controlled by oral hypoglycemic drugs  (metformin)  Medical complication of pregnancy (previous    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 Cord Insertion:      Previously Visualized  Amniotic Fluid  AFI FV:      Anhydramnios ---------------------------------------------------------------------- Biometry  LV:        4.7  mm ---------------------------------------------------------------------- OB History  Gravidity:    2         Term:   1  Living:       1 ---------------------------------------------------------------------- Gestational  Age  LMP:           22w 6d        Date:  06/30/21                 EDD:   04/06/22  Best:          Kathrene Bongo 6d     Det. By:  LMP  (06/30/21)          EDD:   04/06/22 ---------------------------------------------------------------------- Anatomy  Stomach:               Not well visualized    Bladder:                Small ---------------------------------------------------------------------- Cervix Uterus Adnexa  Cervix  Normal appearance by transabdominal scan.  Right Ovary  Not visualized.  Left Ovary  Visualized. ---------------------------------------------------------------------- Comments  Shatora Tisby was seen for a follow-up ultrasound due to  PPROM that occurred at 18+ weeks. She is now 22 weeks  and 6 days pregnant. She denies any signs or symptoms of  an intrauterine infection.  She had a growth scan last week that showed an overall  EFW of 1 pound 1 ounces, 481 g, 52nd percentile for her  gestational age.  A viable singleton intrauterine gestation with anhydramnios  was noted today.  Fetal movements were noted during  today's exam. The fetus was in the breech presentation.  A posterior placenta previa continues to be noted.  The poor prognosis for her pregnancy due to PPROM at a  very early gestational age and due to anhydramnios was  discussed again today.  She understands that due to oligohydramnios/anhydramnios  at her early gestational age, there is an increased risk for  fetal contractures  and fetal pulmonary hypoplasia even if she  delivers at viability or beyond.  As the patient would like [place] to do everything possible to help  her achieve the best outcome for her baby, she will be  admitted tomorrow when she is 23 weeks for continued  inpatient management until delivery. She has already  received a complete course of antenatal corticosteroids  yesterday and today in our office.  While hospitalized, she should receive daily fetal testing.  She should have another ultrasound to assess the fetal  growth  and placental location in 3 weeks.  The patient understands that the goal for her delivery would  be at around 34 weeks.  However, delivery prior to 34 weeks would be indicated  should she go into spontaneous labor, should she show any  signs of an intrauterine infection, or at any time for  nonreassuring fetal status.  She plans on being admitted to the hospital at around 11 am  to 12:00 pm tomorrow.  The patient stated that all of her questions have been  answered today.  A total of 20 she minutes was spent counseling and  coordinating the care for this patient.  Greater than 50% of  the time was spent in direct face-to-face contact. ----------------------------------------------------------------------                   Johnell Comings, MD Electronically Signed Final Report   12/07/2021 01:53 pm ----------------------------------------------------------------------    Medications:  Scheduled  amoxicillin  500 mg Oral TID   aspirin EC  81 mg Oral Daily   docusate sodium  100 mg Oral Daily   insulin aspart  0-24 Units Subcutaneous TID PC   insulin aspart  28 Units Subcutaneous TID WC   insulin glargine-yfgn  24 Units Subcutaneous BID   labetalol  300 mg Oral BID   metFORMIN  1,000 mg Oral BID WC   NIFEdipine  30 mg Oral Daily   pantoprazole  40 mg Oral Daily   prenatal vitamin w/FE, FA  1 tablet Oral Q1200   I have reviewed the patient's current medications.  ASSESSMENT: G2P0101 [redacted]w[redacted]d Estimated Date of Delivery: 04/06/22  PPROM at 18 weeks Class B DM History of TIA Posterior previa Breech presentation  PLAN: No change in care plan  >S/P BMZ x 2: 2/21, 2/22 >on latency antibiotics >CBG still not optimal, battling BMZ effect will increase a bit more aggressively today  >Neonatology to consult with patient later today(called)  Florian Buff 12/11/2021,7:31 AM     Patient ID: Joann Meyers, female   DOB: 31-Mar-1986, 36 y.o.   MRN: [id number]

## 2021-12-11 NOTE — Consult Note (Signed)
Consultation Service: Neonatology   Dr. Elonda Husky has asked for consultation on [place] regarding the care of a premature infant at [redacted]w[redacted]d Thank you for inviting [place] see this patient.   Reason for consult:  Explain the possible complications, the prognosis, and the care of a premature infant at 274and 3/7 weeks.  Chief complaint: 36y.o. female with a singleton female IUP named "Joann Meyers" with an estimated weight of 460 grams. Pregnancy has been complicated by PPROM at 18 weeks with subsequent anhydramnios.  Plan is for delivery via ceasarean section/vaginal delivery if possible.  My key findings of this patient's HPI are:  I have reviewed the patient's chart and have met with her. The salient information is as follows:   [facility] PPROM for latency antibiotics, BMZ, and NSTs. She is currently not in labor and infant appears well on monitoring thus far.  Prenatal labs:   Prenatal care:   good Pregnancy complications:  PPROM (18 weeks), Diabetes (class B), posterior previa, history of TIA, and breech presentation Maternal antibiotics: This patient's mother is not on file. Maternal Steroids: Yes Most recent dose:  2/22   My recommendations for this patient and my actions included:   1. In the presence of the Toshi B[place], I spent 20 minutes discussing the possible complications and outcomes of prematurity at this gestational age. I discussed specific complications at this gestational age referencing the need for resuscitation at birth due to respiratory distress which may require mechanical ventilation, CPAP, and surfactant administration. 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 In addition infant may require IV fluids pending establishment of enteral feeds (encouraged breast milk feeding), antibiotics for possible sepsis, temperature support, and continuous monitoring. I also discussed  the potential risk of complications such as intracranial hemorrhage, retinopathy, hearing deficit, and chronic lung disease. I discussed this with mom in detail and they expressed an understanding of the risks and complications of prematurity.   2. I also discussed the expected survival of an infant born at 216 weeks which is Poor (given his anhydramnios). We further discussed that (Most) of the neonates born at this age have profound or severe neurological complications and school difficulties. In addition, (Most) of the neonates born at this age will have some for of mild to moderate neurological complications. She expressed an understanding of this information.   3. I informed her that given Joann Meyers's peri-viable status, a comfort based approach is an option although he would not survive without intensive care. We can also offer a trial of intensive care where we perform the needed interventions to sustain life and monitor his response. We do not need a decision on this at this time, however, the OB team will need to know her wishes given the likely rout of delivery (c-section) due to his breech presentation. I explained that given his poor prognosis, it would be reasonable to only consider a trial of intensive care once we are at a later gestation (>25 weeks) and the overall survival chance and "in-tact" survival looks less bleak. She will discuss these options [place].  4. She also understood that our team will always be available for any questions that come up during their infant's hospitalization and we will continue to partner with their family to support them through this difficult time. Visitation policy was discussed and  all questions were addressed.   Final Impression:  36 y.o. female with a singleton 19 week female IUP who is threatening to deliver and who now understands the possible complications and prognosis of her infant. The  mother is still deciding the plan for resuscitation and ICU care. Joann Meyers's questions were answered. She is planning to try and provide breast milk for her infant.    ______________________________________________________________________  Thank you for asking [place] to participate in the care of this patient. Please do not hesitate to contact us again if you are aware of any further ways we can be of assistance.   Sincerely,  Joann Circle, MD Attending Neonatologist   I spent ~35 minutes in consultation time, of which 15 minutes was spent in direct face to face counseling.

## 2021-12-12 DIAGNOSIS — O42112 Preterm premature rupture of membranes, onset of labor more than 24 hours following rupture, second trimester: Secondary | ICD-10-CM | POA: Diagnosis not present

## 2021-12-12 DIAGNOSIS — O10919 Unspecified pre-existing hypertension complicating pregnancy, unspecified trimester: Secondary | ICD-10-CM | POA: Diagnosis not present

## 2021-12-12 DIAGNOSIS — Z3A23 23 weeks gestation of pregnancy: Secondary | ICD-10-CM

## 2021-12-12 DIAGNOSIS — O24319 Unspecified pre-existing diabetes mellitus in pregnancy, unspecified trimester: Secondary | ICD-10-CM | POA: Diagnosis not present

## 2021-12-12 LAB — COMPREHENSIVE METABOLIC PANEL
ALT: 14 U/L (ref 0–44)
AST: 14 U/L — ABNORMAL LOW (ref 15–41)
Albumin: 2.7 g/dL — ABNORMAL LOW (ref 3.5–5.0)
Alkaline Phosphatase: 38 U/L (ref 38–126)
Anion gap: 8 (ref 5–15)
BUN: 12 mg/dL (ref 6–20)
CO2: 23 mmol/L (ref 22–32)
Calcium: 9.1 mg/dL (ref 8.9–10.3)
Chloride: 104 mmol/L (ref 98–111)
Creatinine, Ser: 0.49 mg/dL (ref 0.44–1.00)
GFR, Estimated: >60 mL/min (ref >60)
Glucose, Bld: 147 mg/dL — ABNORMAL HIGH (ref 70–99)
Potassium: 3.8 mmol/L (ref 3.5–5.1)
Sodium: 135 mmol/L (ref 135–145)
Total Bilirubin: 0.1 mg/dL — ABNORMAL LOW (ref 0.3–1.2)
Total Protein: 5.6 g/dL — ABNORMAL LOW (ref 6.5–8.1)

## 2021-12-12 LAB — CBC WITH DIFFERENTIAL/PLATELET
Abs Immature Granulocytes: 0.12 10*3/uL — ABNORMAL HIGH (ref 0.00–0.07)
Basophils Absolute: 0 10*3/uL (ref 0.0–0.1)
Basophils Relative: 0 %
Eosinophils Absolute: 0.2 10*3/uL (ref 0.0–0.5)
Eosinophils Relative: 3 %
HCT: 27 % — ABNORMAL LOW (ref 36.0–46.0)
Hemoglobin: 9.1 g/dL — ABNORMAL LOW (ref 12.0–15.0)
Immature Granulocytes: 2 %
Lymphocytes Relative: 12 %
Lymphs Abs: 0.9 10*3/uL (ref 0.7–4.0)
MCH: 27.7 pg (ref 26.0–34.0)
MCHC: 33.7 g/dL (ref 30.0–36.0)
MCV: 82.1 fL (ref 80.0–100.0)
Monocytes Absolute: 0.5 10*3/uL (ref 0.1–1.0)
Monocytes Relative: 7 %
Neutro Abs: 5.7 10*3/uL (ref 1.7–7.7)
Neutrophils Relative %: 76 %
Platelets: 149 10*3/uL — ABNORMAL LOW (ref 150–400)
RBC: 3.29 MIL/uL — ABNORMAL LOW (ref 3.87–5.11)
RDW: 16.7 % — ABNORMAL HIGH (ref 11.5–15.5)
WBC: 7.4 10*3/uL (ref 4.0–10.5)
nRBC: 0 % (ref 0.0–0.2)

## 2021-12-12 LAB — GLUCOSE, CAPILLARY
Glucose-Capillary: 143 mg/dL — ABNORMAL HIGH (ref 70–99)
Glucose-Capillary: 144 mg/dL — ABNORMAL HIGH (ref 70–99)
Glucose-Capillary: 95 mg/dL (ref 70–99)
Glucose-Capillary: 129 mg/dL — ABNORMAL HIGH (ref 70–99)
Glucose-Capillary: 82 mg/dL (ref 70–99)

## 2021-12-12 LAB — TYPE AND SCREEN
ABO/RH(D): O POS
Antibody Screen: NEGATIVE

## 2021-12-12 MED ORDER — FERROUS GLUCONATE 324 (38 FE) MG PO TABS
324.0000 mg | ORAL_TABLET | Freq: Every day | ORAL | Status: DC
  Administered 2021-12-12 – 2022-01-17 (×36): 324 mg via ORAL
  Filled 2021-12-12 (×37): qty 1

## 2021-12-12 MED ORDER — INSULIN ASPART 100 UNIT/ML IJ SOLN
30.0000 [IU] | Freq: Three times a day (TID) | INTRAMUSCULAR | Status: DC
  Administered 2021-12-12 – 2021-12-13 (×6): 30 [IU] via SUBCUTANEOUS

## 2021-12-12 MED ORDER — INSULIN GLARGINE-YFGN 100 UNIT/ML ~~LOC~~ SOLN
28.0000 [IU] | Freq: Two times a day (BID) | SUBCUTANEOUS | Status: DC
  Administered 2021-12-12 – 2021-12-14 (×6): 28 [IU] via SUBCUTANEOUS
  Filled 2021-12-12 (×9): qty 0.28

## 2021-12-12 NOTE — Progress Notes (Signed)
Inpatient Diabetes Program Recommendations  Diabetes Treatment Program Recommendations  ADA Standards of Care Diabetes in Pregnancy Target Glucose Ranges:  Fasting: 70 - 95 mg/dL 1 hr postprandial: Less than 140mg /dL (from first bite of meal) 2 hr postprandial: Less than 120 mg/dL (from first bite of meal)      Lab Results  Component Value Date   GLUCAP 95 12/12/2021   HGBA1C 7.4 (H) 09/15/2021    Noted increase in doses today, in agreement. Spoke with patient to reinforce concepts discussed previously on 2/24.  Applied Freestyle Libre to right upper arm. Reviewed risks and benefits, obtained MD order for application, and how to obtain additional. Patient has no further questions. Following.   Thanks, Bronson Curb, MSN, RNC-OB Diabetes Coordinator [phone] (8a-5p)

## 2021-12-12 NOTE — Progress Notes (Signed)
Patient ID: Joann Meyers, female   DOB: Jan 26, 1986, 36 y.o.   MRN: [id number] [place]) NOTE  Joann Meyers is a 36 y.o. G2P0101 at [redacted]w[redacted]d  who is admitted for PPROM that occurred at 18 weeks, posterior previa, Class B DM  Fetal presentation is breech.  Length of Stay:  4  Days  Date of admission:12/08/2021  Subjective: No complaints Patient reports the fetal movement as active. Patient reports uterine contraction  activity as none. Patient reports  vaginal bleeding as none. Patient describes fluid per vagina as clear.  Vitals:  Blood pressure (!) 126/55, pulse 85, temperature 98.2 F (36.8 C), temperature source Oral, resp. rate 18, height 5\' 2"  (1.575 m), weight 93.9 kg, last menstrual period 06/30/2021, SpO2 99 %, not currently breastfeeding. Vitals:   12/11/21 1522 12/11/21 1923 12/11/21 2312 12/12/21 0329  BP: (!) 126/59 128/60 (!) 122/54 (!) 126/55  Pulse: 87 89 79 85  Resp: 18 17 20 18   Temp: 98.2 F (36.8 C) 98.2 F (36.8 C) 98.5 F (36.9 C) 98.2 F (36.8 C)  TempSrc: Oral Oral Oral Oral  SpO2: 100% 99% 100% 99%  Weight:      Height:       Physical Examination: General appearance - alert, well appearing, and in no distress Abdomen - soft, nontender, nondistended, no masses or organomegaly Fundal Height:  size equals dates Pelvic Exam:  examination not indicated Cervical Exam: Not evaluated.  Extremities: extremities normal, atraumatic, no cyanosis or edema with DTRs 2+ bilaterally Membranes:ruptured, clear fluid  Fetal Monitoring:  Baseline: 140 bpm, 10 x 10 accelerations and  rare variable  decelerations >> APPROPRIATE FOR GESTATIONAL AGE  Labs:  Results for orders placed or performed during the hospital encounter of 12/08/21 (from the past 24 hour(s))  Glucose, capillary   Collection Time: 12/11/21  6:01 AM  Result Value Ref Range   Glucose-Capillary 166 (H) 70 - 99 mg/dL  Glucose, capillary   Collection Time: 12/11/21  11:12 AM  Result Value Ref Range   Glucose-Capillary 162 (H) 70 - 99 mg/dL  Glucose, capillary   Collection Time: 12/11/21  3:20 PM  Result Value Ref Range   Glucose-Capillary 75 70 - 99 mg/dL  Glucose, capillary   Collection Time: 12/11/21  6:24 PM  Result Value Ref Range   Glucose-Capillary 151 (H) 70 - 99 mg/dL  Glucose, capillary   Collection Time: 12/11/21  8:51 PM  Result Value Ref Range   Glucose-Capillary 191 (H) 70 - 99 mg/dL  CBC with Differential/Platelet   Collection Time: 12/12/21  4:16 AM  Result Value Ref Range   WBC 7.4 4.0 - 10.5 K/uL   RBC 3.29 (L) 3.87 - 5.11 MIL/uL   Hemoglobin 9.1 (L) 12.0 - 15.0 g/dL   HCT 27.0 (L) 36.0 - 46.0 %   MCV 82.1 80.0 - 100.0 fL   MCH 27.7 26.0 - 34.0 pg   MCHC 33.7 30.0 - 36.0 g/dL   RDW 16.7 (H) 11.5 - 15.5 %   Platelets 149 (L) 150 - 400 K/uL   nRBC 0.0 0.0 - 0.2 %   Neutrophils Relative % 76 %   Neutro Abs 5.7 1.7 - 7.7 K/uL   Lymphocytes Relative 12 %   Lymphs Abs 0.9 0.7 - 4.0 K/uL   Monocytes Relative 7 %   Monocytes Absolute 0.5 0.1 - 1.0 K/uL   Eosinophils Relative 3 %   Eosinophils Absolute 0.2 0.0 - 0.5 K/uL   Basophils Relative 0 %   Basophils  Absolute 0.0 0.0 - 0.1 K/uL   Immature Granulocytes 2 %   Abs Immature Granulocytes 0.12 (H) 0.00 - 0.07 K/uL  Comprehensive metabolic panel   Collection Time: 12/12/21  4:16 AM  Result Value Ref Range   Sodium 135 135 - 145 mmol/L   Potassium 3.8 3.5 - 5.1 mmol/L   Chloride 104 98 - 111 mmol/L   CO2 23 22 - 32 mmol/L   Glucose, Bld 147 (H) 70 - 99 mg/dL   BUN 12 6 - 20 mg/dL   Creatinine, Ser 0.49 0.44 - 1.00 mg/dL   Calcium 9.1 8.9 - 10.3 mg/dL   Total Protein 5.6 (L) 6.5 - 8.1 g/dL   Albumin 2.7 (L) 3.5 - 5.0 g/dL   AST 14 (L) 15 - 41 U/L   ALT 14 0 - 44 U/L   Alkaline Phosphatase 38 38 - 126 U/L   Total Bilirubin 0.1 (L) 0.3 - 1.2 mg/dL   GFR, Estimated >60 >60 mL/min   Anion gap 8 5 - 15  Type and screen [place]   Collection Time:  12/12/21  4:16 AM  Result Value Ref Range   ABO/RH(D) O POS    Antibody Screen NEG    Sample Expiration      12/15/2021,2359 Performed at [facility], [address] [place], [place] [address]   Glucose, capillary   Collection Time: 12/12/21  5:10 AM  Result Value Ref Range   Glucose-Capillary 143 (H) 70 - 99 mg/dL    Imaging Studies:    MR BRAIN WO CONTRAST  Result Date: 12/11/2021 CLINICAL DATA:  Acute stroke suspected EXAM: MRI HEAD WITHOUT CONTRAST TECHNIQUE: Multiplanar, multiecho pulse sequences of the brain and surrounding structures were obtained without intravenous contrast. COMPARISON:  08/02/2020 FINDINGS: Brain: No acute infarction, hemorrhage, hydrocephalus, extra-axial collection or mass lesion. Remote white matter and frontal cortex infarcts on the right. Few small remote insults in the left cerebral white matter as well. Vascular: Chronic loss of right ICA flow void, known occlusion. Skull and upper cervical spine: Hypointense marrow signal in the visible cervical spine, usually red marrow reconversion Sinuses/Orbits: Negative IMPRESSION: 1. No acute finding. 2. 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 Electronically Signed   By: Jorje Guild M.D.   On: 12/11/2021 10:41     Medications:  Scheduled  amoxicillin  500 mg Oral TID   aspirin EC  81 mg Oral Daily   docusate sodium  100 mg Oral Daily   ferrous gluconate  324 mg Oral Q breakfast   insulin aspart  0-24 Units Subcutaneous TID PC   insulin aspart  30 Units Subcutaneous TID WC   insulin glargine-yfgn  28 Units Subcutaneous BID   labetalol  300 mg Oral BID   metFORMIN  1,000 mg Oral BID WC   NIFEdipine  30 mg Oral Daily   pantoprazole  40 mg Oral Daily   prenatal vitamin w/FE, FA  1 tablet Oral Q1200   I have reviewed the patient's current medications.  ASSESSMENT: G2P0101 [redacted]w[redacted]d Estimated Date of Delivery: 04/06/22  PPROM at 18 weeks Class B DM CHTN, h/o severe  preeclampsia History of stroke, history of carotid artery dissection Anemia in third trimester Posterior previa Previous cesarean section Breech presentation  PLAN:  >S/P BMZ x 2: 2/21, 2/22 >On latency antibiotics >CBG still not optimal, battling BMZ effect. Increased Levemir and Aspart to treat sugars better >Stable BP on Nifedipine XL 30 mg po qd >Counseled  about anemia, recommended oral iron which was ordered. Patient had reaction to [facility], not wanting to do IV iron. Ordered for CBC, T&S q72h. >Reassuring FHT, continue NST bid. s/p NICU consult >Continue routine antenatal care   Verita Schneiders, MD 12/12/2021,5:59 AM

## 2021-12-13 ENCOUNTER — Encounter (HOSPITAL_COMMUNITY): Payer: Self-pay | Admitting: Obstetrics & Gynecology

## 2021-12-13 ENCOUNTER — Other Ambulatory Visit: Payer: Self-pay

## 2021-12-13 DIAGNOSIS — O99012 Anemia complicating pregnancy, second trimester: Secondary | ICD-10-CM

## 2021-12-13 DIAGNOSIS — Z3A26 26 weeks gestation of pregnancy: Secondary | ICD-10-CM

## 2021-12-13 LAB — GLUCOSE, CAPILLARY
Glucose-Capillary: 116 mg/dL — ABNORMAL HIGH (ref 70–99)
Glucose-Capillary: 122 mg/dL — ABNORMAL HIGH (ref 70–99)

## 2021-12-13 NOTE — Progress Notes (Signed)
Patient ID: Joann Meyers, female   DOB: March 24, 1986, 36 y.o.   MRN: [id number] [place]) NOTE  Joann Meyers is a 36 y.o. G2P0101 at [redacted]w[redacted]d  who is admitted for PPROM that occurred at 18 weeks, posterior previa, Class B DM  Fetal presentation is breech.  Length of Stay:  5  Days  Date of admission:12/08/2021  Subjective: No complaints Patient reports the fetal movement as active. Patient reports uterine contraction  activity as none. Patient reports  vaginal bleeding as none. Patient describes fluid per vagina as clear.  Vitals:  Blood pressure 133/68, pulse 85, temperature 98 F (36.7 C), temperature source Oral, resp. rate 18, height 5\' 2"  (1.575 m), weight 93.9 kg, last menstrual period 06/30/2021, SpO2 100 %, not currently breastfeeding. Vitals:   12/12/21 1600 12/12/21 1934 12/13/21 0005 12/13/21 0401  BP: 131/69 135/67 128/65 133/68  Pulse: 87 94 84 85  Resp: 18 20 18 18   Temp: 98.4 F (36.9 C) 98.7 F (37.1 C) 98.6 F (37 C) 98 F (36.7 C)  TempSrc: Oral Oral Oral Oral  SpO2: 100% 100% 100% 100%  Weight:      Height:       Physical Examination: General appearance - alert, well appearing, and in no distress Abdomen - soft, nontender, nondistended, no masses or organomegaly Fundal Height:  size equals dates Pelvic Exam:  examination not indicated Cervical Exam: Not evaluated.  Extremities: extremities normal, atraumatic, no cyanosis or edema with DTRs 2+ bilaterally Membranes:ruptured, clear fluid  Fetal Monitoring:  Baseline: 150 bpm, 10 x 10 accelerations and  rare variable  decelerations >> APPROPRIATE FOR GESTATIONAL AGE  Labs:  Results for orders placed or performed during the hospital encounter of 12/08/21 (from the past 24 hour(s))  Glucose, capillary   Collection Time: 12/12/21 10:21 AM  Result Value Ref Range   Glucose-Capillary 144 (H) 70 - 99 mg/dL  Glucose, capillary   Collection Time: 12/12/21 11:48 AM  Result  Value Ref Range   Glucose-Capillary 95 70 - 99 mg/dL  Glucose, capillary   Collection Time: 12/12/21  5:03 PM  Result Value Ref Range   Glucose-Capillary 129 (H) 70 - 99 mg/dL  Glucose, capillary   Collection Time: 12/12/21  9:45 PM  Result Value Ref Range   Glucose-Capillary 82 70 - 99 mg/dL    Imaging Studies:    MR BRAIN WO CONTRAST  Result Date: 12/11/2021 CLINICAL DATA:  Acute stroke suspected EXAM: MRI HEAD WITHOUT CONTRAST TECHNIQUE: Multiplanar, multiecho pulse sequences of the brain and surrounding structures were obtained without intravenous contrast. COMPARISON:  08/02/2020 FINDINGS: Brain: No acute infarction, hemorrhage, hydrocephalus, extra-axial collection or mass lesion. Remote white matter and frontal cortex infarcts on the right. Few small remote insults in the left cerebral white matter as well. Vascular: Chronic loss of right ICA flow void, known occlusion. Skull and upper cervical spine: Hypointense marrow signal in the visible cervical spine, usually red marrow reconversion Sinuses/Orbits: Negative IMPRESSION: 1. No acute finding. 2. 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 Electronically Signed   By: Jorje Guild M.D.   On: 12/11/2021 10:41     Medications:  Scheduled  amoxicillin  500 mg Oral TID   aspirin EC  81 mg Oral Daily   docusate sodium  100 mg Oral Daily   ferrous gluconate  324 mg Oral Q breakfast   insulin aspart  0-24 Units Subcutaneous TID PC   insulin aspart  30 Units Subcutaneous TID WC   insulin  glargine-yfgn  28 Units Subcutaneous BID   labetalol  300 mg Oral BID   metFORMIN  1,000 mg Oral BID WC   NIFEdipine  30 mg Oral Daily   pantoprazole  40 mg Oral Daily   prenatal vitamin w/FE, FA  1 tablet Oral Q1200   I have reviewed the patient's current medications.  ASSESSMENT: G2P0101 [redacted]w[redacted]d Estimated Date of Delivery: 04/06/22  PPROM at 24 weeks Class B DM CHTN, h/o severe preeclampsia History of stroke, history of  carotid artery dissection Anemia in third trimester Posterior previa Previous cesarean section Breech presentation  PLAN:  >S/P BMZ x 2: 2/21, 2/22 >On latency antibiotics >CBG improving with primary derangement from BMZ effect. Levemir 28bid, Novolog 30tid >Stable BP on Nifedipine XL 30 mg po qd >Previously counseled about anemia, recommended oral iron which was started on 2/26. Patient had reaction to [facility], not wanting to do IV iron. CBC, T&S q72h. >Reassuring FHT, continue NST bid. s/p NICU consult >Continue routine antenatal care   Radene Gunning, MD 12/13/2021,7:31 AM

## 2021-12-14 DIAGNOSIS — Z3A23 23 weeks gestation of pregnancy: Secondary | ICD-10-CM | POA: Diagnosis not present

## 2021-12-14 DIAGNOSIS — O10919 Unspecified pre-existing hypertension complicating pregnancy, unspecified trimester: Secondary | ICD-10-CM | POA: Diagnosis not present

## 2021-12-14 DIAGNOSIS — O42912 Preterm premature rupture of membranes, unspecified as to length of time between rupture and onset of labor, second trimester: Secondary | ICD-10-CM | POA: Diagnosis not present

## 2021-12-14 DIAGNOSIS — O42112 Preterm premature rupture of membranes, onset of labor more than 24 hours following rupture, second trimester: Secondary | ICD-10-CM | POA: Diagnosis not present

## 2021-12-14 LAB — GLUCOSE, CAPILLARY
Glucose-Capillary: 210 mg/dL — ABNORMAL HIGH (ref 70–99)
Glucose-Capillary: 100 mg/dL — ABNORMAL HIGH (ref 70–99)
Glucose-Capillary: 98 mg/dL (ref 70–99)
Glucose-Capillary: 61 mg/dL — ABNORMAL LOW (ref 70–99)
Glucose-Capillary: 103 mg/dL — ABNORMAL HIGH (ref 70–99)
Glucose-Capillary: 113 mg/dL — ABNORMAL HIGH (ref 70–99)
Glucose-Capillary: 140 mg/dL — ABNORMAL HIGH (ref 70–99)
Glucose-Capillary: 100 mg/dL — ABNORMAL HIGH (ref 70–99)

## 2021-12-14 MED ORDER — INSULIN ASPART 100 UNIT/ML IJ SOLN
5.0000 [IU] | Freq: Once | INTRAMUSCULAR | Status: AC
  Administered 2021-12-14: 5 [IU] via SUBCUTANEOUS

## 2021-12-14 MED ORDER — INSULIN ASPART 100 UNIT/ML IJ SOLN: 0.0000 [IU] | INTRAMUSCULAR | Status: DC

## 2021-12-14 MED ORDER — INSULIN ASPART 100 UNIT/ML IJ SOLN
32.0000 [IU] | Freq: Three times a day (TID) | INTRAMUSCULAR | Status: DC
  Administered 2021-12-14 – 2021-12-15 (×5): 32 [IU] via SUBCUTANEOUS

## 2021-12-14 MED ORDER — DOCUSATE SODIUM 100 MG PO CAPS
100.0000 mg | ORAL_CAPSULE | Freq: Two times a day (BID) | ORAL | Status: DC
  Administered 2021-12-14 – 2022-01-22 (×76): 100 mg via ORAL
  Filled 2021-12-14 (×79): qty 1

## 2021-12-14 MED ORDER — ONDANSETRON 4 MG PO TBDP
ORAL_TABLET | ORAL | Status: AC
  Filled 2021-12-14: qty 1

## 2021-12-14 MED ORDER — POLYETHYLENE GLYCOL 3350 17 G PO PACK
17.0000 g | PACK | Freq: Every day | ORAL | Status: DC | PRN
  Administered 2022-01-13 – 2022-01-14 (×2): 17 g via ORAL
  Filled 2021-12-14 (×2): qty 1

## 2021-12-14 MED ORDER — HYDROCORT-PRAMOXINE (PERIANAL) 1-1 % EX FOAM: 1.0000 | Freq: Two times a day (BID) | CUTANEOUS | Status: DC | PRN

## 2021-12-14 MED ORDER — ONDANSETRON 4 MG PO TBDP
4.0000 mg | ORAL_TABLET | Freq: Three times a day (TID) | ORAL | Status: DC | PRN
  Administered 2021-12-14 – 2022-01-22 (×13): 4 mg via ORAL
  Filled 2021-12-14 (×12): qty 1

## 2021-12-14 NOTE — Progress Notes (Addendum)
Inpatient Diabetes Program Recommendations ? ?ADA Standards of Care 2021 ?Diabetes in Pregnancy Target Glucose Ranges: ? ?Fasting: 60 - 90 mg/dL ?Preprandial: 60 - 105 mg/dL ?1 hr postprandial: Less than 140mg /dL (from first bite of meal) ?2 hr postprandial: Less than 120 mg/dL (from first bit of meal) ?  ? ?Lab Results  ?Component Value Date  ? GLUCAP 210 (H) 12/14/2021  ? HGBA1C 7.4 (H) 09/15/2021  ? ? ?Review of Glycemic Control ? Latest Reference Range & Units 12/13/21 10:35 12/13/21 ?16:00 12/13/21 20:59 12/14/21 06:25  ?Glucose-Capillary 70 - 99 mg/dL 116 (H) 167 (CGM) 122 (H) 210 (H)  ?(H): Data is abnormally high ? ?Diabetes history: DM2 ?Outpatient Diabetes medications:  ?Lantus 14 units BID, Novolog 18 units TID and Metformin 1000 mg BID ?Prior to pregnancy-Metformin 1000 mg BID & Glipizide 10 BID) ?Current orders for Inpatient glycemic control:  ?Semglee 28 units BID, Novolog 30 units TID, Novolog 0-24 units TID after meals, Metformin 1000 mg BID ? ?Inpatient Diabetes Program Recommendations:   ? ?Novolog 32 units TID with meals ? ?Spoke with patient at bedside this morning.  Correction was not administered last evening for CBG of 122 mg/dL. Asked if she had eaten or drank anything prior to her 210 mg/dL CBG this morning.  She states she felt low around 2 am and drank juice.  She states her blood sugar was in the 30's per her CGM.  This DM coordinator reviewed her CGM app and there were not any readings that reveal hypoglycemia.  She rechecked her CGM while I was at bedside and her reading was 87 mg/dL.  The nurse also checked with the hospital glucometer at the same time and it was 100 mg/dL.  Asked Dr. Nelda Marseille for increase in Novolog meal coverage to 32 units TID based on yesterdays PP of 167 mg/dL and 122 mg/dL.   ? ?Addendum@13 :64- ?RN reached out because cbg was 61 mg/dL at 13:13.  2 hr PP at 11:16 was 98 mg/dl.  May have gotten low due to 1 x dose of 5 units Novolog this am for cbg of 210 mg/dL.  Will  check back this afternoon for 2 hour PP cbg.   ? ?Will continue to follow while inpatient. ? ?Thank you, ?Reche Dixon, MSN, RN ?Diabetes Coordinator ?Inpatient Diabetes Program ?[phone] (team pager from 8a-5p) ? ?

## 2021-12-14 NOTE — Progress Notes (Addendum)
FACULTY PRACTICE ANTEPARTUM(COMPREHENSIVE) NOTE ? ?Joann Meyers is a 36 y.o. G2P0101 with Estimated Date of Delivery: 04/06/22   By  early ultrasound [redacted]w[redacted]d  who is admitted for PPROM at 18wks, posterior previa, class B DM ? ?Fetal presentation is breech. ?Length of Stay:  6  Days  Date of admission:12/08/2021 ? ?Subjective: ?No acute complaints overnight ?Patient reports the fetal movement as active. ?Patient reports uterine contraction  activity as none. ?Patient reports  vaginal bleeding as none. ?Patient describes fluid per vagina as clear ? ?Vitals:  Blood pressure 130/71, pulse 83, temperature 98.4 ?F (36.9 ?C), temperature source Oral, resp. rate 17, height 5\' 2"  (1.575 m), weight 93.9 kg, last menstrual period 06/30/2021, SpO2 99 %, not currently breastfeeding. ?Vitals:  ? 12/13/21 2322 12/14/21 0347 12/14/21 6606 12/14/21 0948  ?BP: 131/67 132/68 134/74 130/71  ?Pulse: 87 87 86 83  ?Resp: 16 16 17    ?Temp: 98.5 ?F (36.9 ?C) 98 ?F (36.7 ?C) 98.4 ?F (36.9 ?C)   ?TempSrc: Oral Oral Oral   ?SpO2: 99% 100% 99%   ?Weight:      ?Height:      ? ?Physical Examination: ? General appearance - alert, well appearing, and in no distress ?Mental status - normal mood, behavior, speech, dress, motor activity, and thought processes ?Chest - CTAB ?Heart - normal rate and regular rhythm ?Abdomen - gravid, soft and non-tender ?Extremities - no edema, no calf tenderness bilaterally ?Skin - warm and dry ? ? ?Fetal Monitoring:  Baseline: 150 bpm, Variability: moderate, Accelerations: +10x10 accels, and Decelerations: Absent   appropriate for current gestational age ? ?Labs:  ?Results for orders placed or performed during the hospital encounter of 12/08/21 (from the past 24 hour(s))  ?Glucose, capillary  ? Collection Time: 12/13/21 10:35 AM  ?Result Value Ref Range  ? Glucose-Capillary 116 (H) 70 - 99 mg/dL  ? Comment 1 Notify RN   ? Comment 2 Document in Chart   ?Glucose, capillary  ? Collection Time: 12/13/21  8:59 PM  ?Result  Value Ref Range  ? Glucose-Capillary 122 (H) 70 - 99 mg/dL  ?Glucose, capillary  ? Collection Time: 12/14/21  6:25 AM  ?Result Value Ref Range  ? Glucose-Capillary 210 (H) 70 - 99 mg/dL  ?Glucose, capillary  ? Collection Time: 12/14/21  9:10 AM  ?Result Value Ref Range  ? Glucose-Capillary 100 (H) 70 - 99 mg/dL  ? ? ?Imaging Studies:    ?MR BRAIN WO CONTRAST ? ?Result Date: 12/11/2021 ?CLINICAL DATA:  Acute stroke suspected EXAM: MRI HEAD WITHOUT CONTRAST TECHNIQUE: Multiplanar, multiecho pulse sequences of the brain and surrounding structures were obtained without intravenous contrast. COMPARISON:  08/02/2020 FINDINGS: Brain: No acute infarction, hemorrhage, hydrocephalus, extra-axial collection or mass lesion. Remote white matter and frontal cortex infarcts on the right. Few small remote insults in the left cerebral white matter as well. Vascular: Chronic loss of right ICA flow void, known occlusion. Skull and upper cervical spine: Hypointense marrow signal in the visible cervical spine, usually red marrow reconversion Sinuses/Orbits: Negative IMPRESSION: 1. No acute finding. 2. 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 Electronically Signed   By: Jorje Guild M.D.   On: 12/11/2021 10:41    ? ?Medications:  Scheduled ? amoxicillin  500 mg Oral TID  ? aspirin EC  81 mg Oral Daily  ? docusate sodium  100 mg Oral Daily  ? ferrous gluconate  324 mg Oral Q breakfast  ? insulin aspart  0-12 Units Subcutaneous UD  ?  insulin aspart  0-24 Units Subcutaneous TID PC  ? insulin aspart  32 Units Subcutaneous TID WC  ? insulin glargine-yfgn  28 Units Subcutaneous BID  ? labetalol  300 mg Oral BID  ? metFORMIN  1,000 mg Oral BID WC  ? NIFEdipine  30 mg Oral Daily  ? pantoprazole  40 mg Oral Daily  ? prenatal vitamin w/FE, FA  1 tablet Oral Q1200  ? ?I have reviewed the patient's current medications. ? ?ASSESSMENT: ?G2P0101 [redacted]w[redacted]d Estimated Date of Delivery: 04/06/22  ? ?-PPROM, anyhydramnios ?-Placenta  previa ?-Type 2 DM on insulin ?-Breech presentation ?-Prior C-section ?-chronic HTN with h/o carotid a. Dissection ?-h/o TIA ?-Anemia ? ? ? ?PLAN: ?1) Fetal well being ?-reassuring for gestational age ?-continue daily monitoring ?-s/p BMZ ?-last [place] 2/22, next [place] on 3/9 ?-s/p NICU consult ? ?2) T2DM ?-appreciate DM coordinator recommendations ?-Novolog increased from 30 to 32u with meals, continue metofrmin and Levemir 28mg  bid ? ?3) chronic HTN ?-BP well controlled with Labetalol 300mg  bid, Procardia XL 30mg  daily ? ?4) PPROM with Previa and breech presentation ?--currently on latency antibiotics ?-when delivery indicated plan for repeat C-section ?-no evidence of labor or infection at this time ? ?DISPO: Continue with care as outlined above.  Ideally plan for delivery around 34wks via C-section or sooner for maternal or fetal indication. ? ?[name] ?12/14/2021,10:13 AM ? ? ? ?  ?

## 2021-12-15 ENCOUNTER — Other Ambulatory Visit: Payer: Self-pay

## 2021-12-15 DIAGNOSIS — O24319 Unspecified pre-existing diabetes mellitus in pregnancy, unspecified trimester: Secondary | ICD-10-CM | POA: Diagnosis not present

## 2021-12-15 DIAGNOSIS — O42112 Preterm premature rupture of membranes, onset of labor more than 24 hours following rupture, second trimester: Secondary | ICD-10-CM | POA: Diagnosis not present

## 2021-12-15 DIAGNOSIS — Z8673 Personal history of transient ischemic attack (TIA), and cerebral infarction without residual deficits: Secondary | ICD-10-CM

## 2021-12-15 DIAGNOSIS — O09299 Supervision of pregnancy with other poor reproductive or obstetric history, unspecified trimester: Secondary | ICD-10-CM

## 2021-12-15 LAB — TYPE AND SCREEN
ABO/RH(D): O POS
Antibody Screen: NEGATIVE

## 2021-12-15 LAB — COMPREHENSIVE METABOLIC PANEL
ALT: 14 U/L (ref 0–44)
AST: 16 U/L (ref 15–41)
Albumin: 2.7 g/dL — ABNORMAL LOW (ref 3.5–5.0)
Alkaline Phosphatase: 36 U/L — ABNORMAL LOW (ref 38–126)
Anion gap: 9 (ref 5–15)
BUN: 16 mg/dL (ref 6–20)
CO2: 22 mmol/L (ref 22–32)
Calcium: 8.8 mg/dL — ABNORMAL LOW (ref 8.9–10.3)
Chloride: 104 mmol/L (ref 98–111)
Creatinine, Ser: 0.53 mg/dL (ref 0.44–1.00)
GFR, Estimated: >60 mL/min (ref >60)
Glucose, Bld: 132 mg/dL — ABNORMAL HIGH (ref 70–99)
Potassium: 3.8 mmol/L (ref 3.5–5.1)
Sodium: 135 mmol/L (ref 135–145)
Total Bilirubin: 0.2 mg/dL — ABNORMAL LOW (ref 0.3–1.2)
Total Protein: 5.5 g/dL — ABNORMAL LOW (ref 6.5–8.1)

## 2021-12-15 LAB — CBC WITH DIFFERENTIAL/PLATELET
Abs Immature Granulocytes: 0.09 10*3/uL — ABNORMAL HIGH (ref 0.00–0.07)
Basophils Absolute: 0 10*3/uL (ref 0.0–0.1)
Basophils Relative: 0 %
Eosinophils Absolute: 0.2 10*3/uL (ref 0.0–0.5)
Eosinophils Relative: 3 %
HCT: 26 % — ABNORMAL LOW (ref 36.0–46.0)
Hemoglobin: 9 g/dL — ABNORMAL LOW (ref 12.0–15.0)
Immature Granulocytes: 1 %
Lymphocytes Relative: 13 %
Lymphs Abs: 1 10*3/uL (ref 0.7–4.0)
MCH: 28.4 pg (ref 26.0–34.0)
MCHC: 34.6 g/dL (ref 30.0–36.0)
MCV: 82 fL (ref 80.0–100.0)
Monocytes Absolute: 0.6 10*3/uL (ref 0.1–1.0)
Monocytes Relative: 8 %
Neutro Abs: 5.5 10*3/uL (ref 1.7–7.7)
Neutrophils Relative %: 75 %
Platelets: 145 10*3/uL — ABNORMAL LOW (ref 150–400)
RBC: 3.17 MIL/uL — ABNORMAL LOW (ref 3.87–5.11)
RDW: 16 % — ABNORMAL HIGH (ref 11.5–15.5)
WBC: 7.4 10*3/uL (ref 4.0–10.5)
nRBC: 0 % (ref 0.0–0.2)

## 2021-12-15 LAB — GLUCOSE, CAPILLARY: Glucose-Capillary: 94 mg/dL (ref 70–99)

## 2021-12-15 MED ORDER — INSULIN GLARGINE-YFGN 100 UNIT/ML ~~LOC~~ SOLN
30.0000 [IU] | Freq: Two times a day (BID) | SUBCUTANEOUS | Status: DC
  Administered 2021-12-15 – 2021-12-17 (×5): 30 [IU] via SUBCUTANEOUS
  Filled 2021-12-15 (×6): qty 0.3

## 2021-12-15 MED ORDER — INSULIN ASPART 100 UNIT/ML IJ SOLN
35.0000 [IU] | Freq: Three times a day (TID) | INTRAMUSCULAR | Status: DC
  Administered 2021-12-15 – 2021-12-17 (×5): 35 [IU] via SUBCUTANEOUS

## 2021-12-15 NOTE — Progress Notes (Addendum)
Inpatient Diabetes Program Recommendations ? ?AACE/ADA: New Consensus Statement on Inpatient Glycemic Control (2015) ? ?Target Ranges:  Prepandial:   less than 140 mg/dL ?     Peak postprandial:   less than 180 mg/dL (1-2 hours) ?     Critically ill patients:  140 - 180 mg/dL  ? ?Lab Results  ?Component Value Date  ? GLUCAP 100 (H) 12/14/2021  ? HGBA1C 7.4 (H) 09/15/2021  ? ? ?Review of Glycemic Control ? Latest Reference Range & Units 12/14/21 06:25 12/14/21 09:10 12/14/21 11:16 12/14/21 13:13 12/14/21 13:33 12/14/21 15:45 12/14/21 18:45 12/14/21 21:24 12/15/21 ?04:00 12/15/21 ?08:39  ?Glucose-Capillary 70 - 99 mg/dL 210 (H) 100 (H) 98 61 (L) 103 (H) 113 (H) 140 (H) 100 (H) ? ? 144 ?Per CGM 119 ?Per CGM  ?(H): Data is abnormally high ? ? ? ?Current orders for Inpatient glycemic control:  ?Semglee 28 units BID ?Novolog 0-24 units TID ?Novolog 32 units TID ?Metformin 1000 mg BID ? ?Inpatient Diabetes Program Recommendations:   ? ?Semglee 30 units BID ? ?Addendum @ 1255: ?Increase meal coverage to 35 units TID; verbal order received from Dr. Nelda Marseille. ? ?Will continue to follow while inpatient. ? ?Thank you, ?Reche Dixon, MSN, RN ?Diabetes Coordinator ?Inpatient Diabetes Program ?[phone] (team pager from 8a-5p) ? ? ? ? ? ?

## 2021-12-15 NOTE — Progress Notes (Signed)
FACULTY PRACTICE ANTEPARTUM(COMPREHENSIVE) NOTE ? ?Joann Meyers is a 36 y.o. G2P0101 with Estimated Date of Delivery: 04/06/22   By  early ultrasound [redacted]w[redacted]d  who is admitted for PPROM.   ? ?Fetal presentation is breech. ?Length of Stay:  7  Days  Date of admission:12/08/2021 ? ?Subjective: ?No acute complaints overnight ?Patient reports the fetal movement as active. ?Patient reports uterine contraction  activity as none. ?Patient reports  vaginal bleeding as none. ?Patient describes fluid per vagina as Clear. ? ?Vitals:  Blood pressure 126/65, pulse 79, temperature 98.2 ?F (36.8 ?C), temperature source Oral, resp. rate 16, height 5\' 2"  (1.575 m), weight 93.9 kg, last menstrual period 06/30/2021, SpO2 99 %, not currently breastfeeding. ?Vitals:  ? 12/14/21 2005 12/14/21 2346 12/15/21 0401 12/15/21 0801  ?BP: 138/63 136/68 136/68 126/65  ?Pulse: 85 86 85 79  ?Resp: 16 18 16 16   ?Temp: 98.6 ?F (37 ?C) 98.3 ?F (36.8 ?C) 98 ?F (36.7 ?C) 98.2 ?F (36.8 ?C)  ?TempSrc: Oral Oral Oral Oral  ?SpO2: 100% 100% 100% 99%  ?Weight:      ?Height:      ? ?Physical Examination: ? General appearance - alert, well appearing, and in no distress ?Mental status - normal mood, behavior, speech, dress, motor activity, and thought processes ?Chest - CTAB ?Heart - normal rate, systolic murmur noted ?Abdomen - gravid, soft and non-tender ?Extremities - no pedal edema noted, no calf tenderness bilaterally ?Skin - warm and dry ? ? ?Fetal Monitoring:  Baseline: 150 bpm, Variability: moderate, Accelerations: +10x10 accels, and Decelerations: Absent    ?Appropriate for gestational age ? ?Labs:  ?Results for orders placed or performed during the hospital encounter of 12/08/21 (from the past 24 hour(s))  ?Glucose, capillary  ? Collection Time: 12/14/21  1:13 PM  ?Result Value Ref Range  ? Glucose-Capillary 61 (L) 70 - 99 mg/dL  ?Glucose, capillary  ? Collection Time: 12/14/21  1:33 PM  ?Result Value Ref Range  ? Glucose-Capillary 103 (H) 70 - 99 mg/dL   ?Glucose, capillary  ? Collection Time: 12/14/21  3:45 PM  ?Result Value Ref Range  ? Glucose-Capillary 113 (H) 70 - 99 mg/dL  ?Glucose, capillary  ? Collection Time: 12/14/21  6:45 PM  ?Result Value Ref Range  ? Glucose-Capillary 140 (H) 70 - 99 mg/dL  ?Glucose, capillary  ? Collection Time: 12/14/21  9:24 PM  ?Result Value Ref Range  ? Glucose-Capillary 100 (H) 70 - 99 mg/dL  ?CBC with Differential/Platelet  ? Collection Time: 12/15/21  4:27 AM  ?Result Value Ref Range  ? WBC 7.4 4.0 - 10.5 K/uL  ? RBC 3.17 (L) 3.87 - 5.11 MIL/uL  ? Hemoglobin 9.0 (L) 12.0 - 15.0 g/dL  ? HCT 26.0 (L) 36.0 - 46.0 %  ? MCV 82.0 80.0 - 100.0 fL  ? MCH 28.4 26.0 - 34.0 pg  ? MCHC 34.6 30.0 - 36.0 g/dL  ? RDW 16.0 (H) 11.5 - 15.5 %  ? Platelets 145 (L) 150 - 400 K/uL  ? nRBC 0.0 0.0 - 0.2 %  ? Neutrophils Relative % 75 %  ? Neutro Abs 5.5 1.7 - 7.7 K/uL  ? Lymphocytes Relative 13 %  ? Lymphs Abs 1.0 0.7 - 4.0 K/uL  ? Monocytes Relative 8 %  ? Monocytes Absolute 0.6 0.1 - 1.0 K/uL  ? Eosinophils Relative 3 %  ? Eosinophils Absolute 0.2 0.0 - 0.5 K/uL  ? Basophils Relative 0 %  ? Basophils Absolute 0.0 0.0 - 0.1 K/uL  ?  Immature Granulocytes 1 %  ? Abs Immature Granulocytes 0.09 (H) 0.00 - 0.07 K/uL  ?Comprehensive metabolic panel  ? Collection Time: 12/15/21  4:27 AM  ?Result Value Ref Range  ? Sodium 135 135 - 145 mmol/L  ? Potassium 3.8 3.5 - 5.1 mmol/L  ? Chloride 104 98 - 111 mmol/L  ? CO2 22 22 - 32 mmol/L  ? Glucose, Bld 132 (H) 70 - 99 mg/dL  ? BUN 16 6 - 20 mg/dL  ? Creatinine, Ser 0.53 0.44 - 1.00 mg/dL  ? Calcium 8.8 (L) 8.9 - 10.3 mg/dL  ? Total Protein 5.5 (L) 6.5 - 8.1 g/dL  ? Albumin 2.7 (L) 3.5 - 5.0 g/dL  ? AST 16 15 - 41 U/L  ? ALT 14 0 - 44 U/L  ? Alkaline Phosphatase 36 (L) 38 - 126 U/L  ? Total Bilirubin 0.2 (L) 0.3 - 1.2 mg/dL  ? GFR, Estimated >60 >60 mL/min  ? Anion gap 9 5 - 15  ?Type and screen Pax  ? Collection Time: 12/15/21  4:27 AM  ?Result Value Ref Range  ? ABO/RH(D) O POS   ?  Antibody Screen NEG   ? Sample Expiration    ?  12/18/2021,2359 ?Performed at [facility], [place] [address], [place], [place] [address] ?  ? ? ?Medications:  Scheduled ? aspirin EC  81 mg Oral Daily  ? docusate sodium  100 mg Oral BID  ? ferrous gluconate  324 mg Oral Q breakfast  ? insulin aspart  0-12 Units Subcutaneous UD  ? insulin aspart  0-24 Units Subcutaneous TID PC  ? insulin aspart  32 Units Subcutaneous TID WC  ? insulin glargine-yfgn  30 Units Subcutaneous BID  ? labetalol  300 mg Oral BID  ? metFORMIN  1,000 mg Oral BID WC  ? NIFEdipine  30 mg Oral Daily  ? pantoprazole  40 mg Oral Daily  ? prenatal vitamin w/FE, FA  1 tablet Oral Q1200  ? ?I have reviewed the patient's current medications. ? ?ASSESSMENT: ?G2P0101 [redacted]w[redacted]d Estimated Date of Delivery: 04/06/22  ?-PPROM, anyhydramnios ?-Placenta previa ?-Type 2 DM on insulin ?-Breech presentation ?-Prior C-section ?-chronic HTN with h/o carotid a. Dissection ?-h/o TIA ?-Anemia ? ?PLAN: ?1) Fetal well being- appropriate for gestational age ?-continue daily monitoring ?-s/p BMZ ?-next [place] on 3/9 ?-s/p NICU consult ? ?2) T2DM ?-appreciate DM coordinator recommendations ?--increase Glargine to 30unit bid, continue Novolog 32 with meals ?-reviewed appropriate snacks ? ?3) chronic HTN ?-remains stable with current meds ?-Labetalol 300mg  bid, Procardia XL 30mg  daily ? ?4) PPROM with previa and breech presentation ?-completion of antibiotics today ?-plan for repeat C-section ?-no evidence of labor or infection today ? ?DISP: Continue with care as outlined above.  Ideally plan for delivery at  34wks via C-section or sooner for maternal or fetal indication. ? ?[name] ?12/15/2021,11:58 AM ? ? ? ?  ?

## 2021-12-16 DIAGNOSIS — O42112 Preterm premature rupture of membranes, onset of labor more than 24 hours following rupture, second trimester: Secondary | ICD-10-CM | POA: Diagnosis not present

## 2021-12-16 LAB — GLUCOSE, CAPILLARY
Glucose-Capillary: 99 mg/dL (ref 70–99)
Glucose-Capillary: 100 mg/dL — ABNORMAL HIGH (ref 70–99)
Glucose-Capillary: 147 mg/dL — ABNORMAL HIGH (ref 70–99)
Glucose-Capillary: 77 mg/dL (ref 70–99)

## 2021-12-16 NOTE — Plan of Care (Signed)
?  Problem: Activity: ?Goal: Risk for activity intolerance will decrease ?Outcome: Completed/Met ?  ?Problem: Nutrition: ?Goal: Adequate nutrition will be maintained ?Outcome: Completed/Met ?  ?Problem: Coping: ?Goal: Level of anxiety will decrease ?Outcome: Completed/Met ?  ?Problem: Elimination: ?Goal: Will not experience complications related to urinary retention ?Outcome: Completed/Met ?  ?Problem: Education: ?Goal: Knowledge of disease or condition will improve ?Outcome: Completed/Met ?  ?Problem: Coping: ?Goal: Ability to adjust to condition or change in health will improve ?Outcome: Completed/Met ?  ?Problem: Nutritional: ?Goal: Maintenance of adequate nutrition will improve ?Outcome: Completed/Met ?  ?

## 2021-12-16 NOTE — Plan of Care (Signed)
?  Problem: Activity: Goal: Risk for activity intolerance will decrease Outcome: Completed/Met   Problem: Nutrition: Goal: Adequate nutrition will be maintained Outcome: Completed/Met   Problem: Coping: Goal: Level of anxiety will decrease Outcome: Completed/Met   Problem: Elimination: Goal: Will not experience complications related to urinary retention Outcome: Completed/Met   Problem: Education: Goal: Knowledge of disease or condition will improve Outcome: Completed/Met   

## 2021-12-16 NOTE — Progress Notes (Signed)
Initial Nutrition Assessment ? ?DOCUMENTATION CODES:  ? ?Obesity unspecified ? ?INTERVENTION:  ?Carbohydrate modified gestational diabetic diet ?Double protein portions upon request ? ? ?NUTRITION DIAGNOSIS:  ? ?Increased nutrient needs related to  (pregnancy and fetal growth requirements) as evidenced by  (24 weeks IUP). ? ? ? ?GOAL:  ? ?Patient will meet greater than or equal to 90% of their needs ? ? ? ?MONITOR:  ? ?Weight trends, Labs ? ?REASON FOR ASSESSMENT:  ? ?Antenatal, LOS ?  ? ?ASSESSMENT:  ? ?Now 24 1/7 weeks adm with PROM at 18 weeks, Hx DMII and strocke. Pre- preganancy weight 82.2 kg, BMI 33. 26 lb weight gain to date.3/2 Hct 26 %, on iron supp and PN Vits ? ? ?Diet Order:   ?Diet Order   ? ?       ?  Diet gestational carb mod Fluid consistency: Thin; Room service appropriate? Yes  Diet effective now       ?  ? ?  ?  ? ?  ? ? ?EDUCATION NEEDS:  ? ?No education needs have been identified at this time ? ?Skin:  Skin Assessment: Reviewed RN Assessment ? ? ?Height:  ? ?Ht Readings from Last 1 Encounters:  ?12/08/21 5\' 2"  (1.575 m)  ? ? ?Weight:  ? ?Wt Readings from Last 1 Encounters:  ?12/08/21 93.9 kg  ? ? ?Ideal Body Weight:   110 lbs ? ?BMI:  Body mass index is 37.86 kg/m?. ? ?Estimated Nutritional Needs:  ? ?Kcal:  2300-2500 ? ?Protein:  100-110 g ? ?Fluid:  2.5L ? ? ? ? ?

## 2021-12-16 NOTE — Progress Notes (Signed)
FACULTY PRACTICE ANTEPARTUM(COMPREHENSIVE) NOTE ? ?Joann Meyers is a 36 y.o. G2P0101 with Estimated Date of Delivery: 04/06/22   By  early ultrasound [redacted]w[redacted]d  who is admitted for PPROM.   ? ?Fetal presentation is breech. ?Length of Stay:  8  Days  Date of admission:12/08/2021 ? ?Subjective: ?No acute complaints overnight. ?Patient reports the fetal movement as active. ?Patient reports uterine contraction  activity as none. ?Patient reports  vaginal bleeding as none. ?Patient describes fluid per vagina as None. ? ?Vitals:  Blood pressure 139/69, pulse 86, temperature 98.7 ?F (37.1 ?C), temperature source Oral, resp. rate 18, height 5\' 2"  (1.575 m), weight 93.9 kg, last menstrual period 06/30/2021, SpO2 99 %, not currently breastfeeding. ?Vitals:  ? 12/15/21 2010 12/15/21 2250 12/16/21 0307 12/16/21 0757  ?BP: (!) 142/72 135/68 135/67 139/69  ?Pulse: 82 83 79 86  ?Resp: 18 20 18    ?Temp: 98.5 ?F (36.9 ?C) 98.3 ?F (36.8 ?C) 98.1 ?F (36.7 ?C) 98.7 ?F (37.1 ?C)  ?TempSrc: Oral Oral Oral Oral  ?SpO2: 100% 100% 100% 99%  ?Weight:      ?Height:      ? ?Physical Examination: ? General appearance - alert, well appearing, and in no distress ?Mental status - normal mood, behavior, speech, dress, motor activity, and thought processes ?Chest - CTAB ?Heart - normal rate, systolic murmur noted ?Abdomen - gravid, non-tender, no rebound, no guarding ?Extremities - no edema, no calf tenderness bilaterally ?Skin - warm and dry ? ?Fetal Monitoring:  Baseline: 140 bpm, Variability: moderate, Accelerations: +10x10, and Decelerations: variable decel x 1 noted    appropriate for current gestational age ? ?Labs:  ?Results for orders placed or performed during the hospital encounter of 12/08/21 (from the past 24 hour(s))  ?Glucose, capillary  ? Collection Time: 12/15/21  9:58 PM  ?Result Value Ref Range  ? Glucose-Capillary 94 70 - 99 mg/dL  ?Glucose, capillary  ? Collection Time: 12/16/21  7:57 AM  ?Result Value Ref Range  ? Glucose-Capillary  99 70 - 99 mg/dL  ? ? ?Imaging Studies:    ?No results found.  ? ?Medications:  Scheduled ? aspirin EC  81 mg Oral Daily  ? docusate sodium  100 mg Oral BID  ? ferrous gluconate  324 mg Oral Q breakfast  ? insulin aspart  0-12 Units Subcutaneous UD  ? insulin aspart  0-24 Units Subcutaneous TID PC  ? insulin aspart  35 Units Subcutaneous TID WC  ? insulin glargine-yfgn  30 Units Subcutaneous BID  ? labetalol  300 mg Oral BID  ? metFORMIN  1,000 mg Oral BID WC  ? NIFEdipine  30 mg Oral Daily  ? pantoprazole  40 mg Oral Daily  ? prenatal vitamin w/FE, FA  1 tablet Oral Q1200  ? ?I have reviewed the patient's current medications. ? ?ASSESSMENT: ?G2P0101 [redacted]w[redacted]d Estimated Date of Delivery: 04/06/22  ?-PPROM, anyhydramnios ?-Placenta previa ?-Type 2 DM on insulin ?-Breech presentation ?-Prior C-section ?-chronic HTN with h/o carotid a. Dissection ?-h/o TIA ?-Anemia ? ?PLAN: ?1) Fetal well being- reassuring for current gestational age ?-continue daily monitoring ?-s/p BMZ ?-next [place] on 3/9, last [place] completed 2/22 ?-s/p NICU consult ?  ?2) T2DM ?-appreciate DM coordinator recommendations ?-no change in medication today ? ?  ?3) chronic HTN ?-remains stable with current meds ?-Labetalol 300mg  bid, Procardia XL 30mg  daily ?  ?4) PPROM with previa and breech presentation ?-completion of antibiotics today ?-plan for repeat C-section ?-no evidence of labor or infection today ?  ?DISP: Continue  with care as outlined above.  Ideally plan for delivery at  34wks via C-section or sooner for maternal or fetal indication. ? ?Joann Meyers ?12/16/2021,9:08 AM ? ? ? ?  ?

## 2021-12-16 NOTE — Progress Notes (Signed)
CBG for 2000 not taken as patient meal was provided at [place]. Will follow up with 2200 CBG for bedtime as ordered.  ?

## 2021-12-17 DIAGNOSIS — O42112 Preterm premature rupture of membranes, onset of labor more than 24 hours following rupture, second trimester: Secondary | ICD-10-CM | POA: Diagnosis not present

## 2021-12-17 MED ORDER — INSULIN GLARGINE-YFGN 100 UNIT/ML ~~LOC~~ SOLN
24.0000 [IU] | Freq: Two times a day (BID) | SUBCUTANEOUS | Status: DC
Start: 2021-12-17 — End: 2021-12-22
  Administered 2021-12-17 – 2021-12-22 (×11): 24 [IU] via SUBCUTANEOUS
  Filled 2021-12-17 (×12): qty 0.24

## 2021-12-17 MED ORDER — INSULIN ASPART 100 UNIT/ML IJ SOLN
30.0000 [IU] | Freq: Three times a day (TID) | INTRAMUSCULAR | Status: DC
  Administered 2021-12-17 – 2021-12-21 (×12): 30 [IU] via SUBCUTANEOUS
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
## 2021-12-17 NOTE — Progress Notes (Signed)
Patient called from bathroom. Stated feeling weak and dizzy. CGM device scanned and was 63. Dr. Nelda Marseille made aware and new orders received. Patient given orange juice and yogurt.  ?

## 2021-12-17 NOTE — Progress Notes (Signed)
Called Dr. Nelda Marseille for pt 2 hour postprandial CBG of 54, patient complains of nausea and shaking. Gave patient orange juice and yogurt. Recheck 15 min CBG 83. Data not transferring to flowsheet from glucometer. Will hold long acting insulin for 1 hour per Dr. Nelda Marseille. ?

## 2021-12-17 NOTE — Progress Notes (Signed)
Glucometer not transferring data to flowsheet. 2-hr post prandial CBG result of '129mg'$ /dL taken at 2120 by Eloise Harman, NT.  ?

## 2021-12-18 DIAGNOSIS — O42112 Preterm premature rupture of membranes, onset of labor more than 24 hours following rupture, second trimester: Secondary | ICD-10-CM | POA: Diagnosis not present

## 2021-12-18 LAB — COMPREHENSIVE METABOLIC PANEL
ALT: 17 U/L (ref 0–44)
AST: 17 U/L (ref 15–41)
Albumin: 2.6 g/dL — ABNORMAL LOW (ref 3.5–5.0)
Alkaline Phosphatase: 48 U/L (ref 38–126)
Anion gap: 9 (ref 5–15)
BUN: 18 mg/dL (ref 6–20)
CO2: 23 mmol/L (ref 22–32)
Calcium: 9.3 mg/dL (ref 8.9–10.3)
Chloride: 106 mmol/L (ref 98–111)
Creatinine, Ser: 0.57 mg/dL (ref 0.44–1.00)
GFR, Estimated: >60 mL/min (ref >60)
Glucose, Bld: 126 mg/dL — ABNORMAL HIGH (ref 70–99)
Potassium: 4 mmol/L (ref 3.5–5.1)
Sodium: 138 mmol/L (ref 135–145)
Total Bilirubin: 0.1 mg/dL — ABNORMAL LOW (ref 0.3–1.2)
Total Protein: 5.4 g/dL — ABNORMAL LOW (ref 6.5–8.1)

## 2021-12-18 LAB — CBC WITH DIFFERENTIAL/PLATELET
Abs Immature Granulocytes: 0.07 10*3/uL (ref 0.00–0.07)
Basophils Absolute: 0 10*3/uL (ref 0.0–0.1)
Basophils Relative: 0 %
Eosinophils Absolute: 0.1 10*3/uL (ref 0.0–0.5)
Eosinophils Relative: 2 %
HCT: 26 % — ABNORMAL LOW (ref 36.0–46.0)
Hemoglobin: 8.5 g/dL — ABNORMAL LOW (ref 12.0–15.0)
Immature Granulocytes: 1 %
Lymphocytes Relative: 11 %
Lymphs Abs: 0.8 10*3/uL (ref 0.7–4.0)
MCH: 27.2 pg (ref 26.0–34.0)
MCHC: 32.7 g/dL (ref 30.0–36.0)
MCV: 83.3 fL (ref 80.0–100.0)
Monocytes Absolute: 0.5 10*3/uL (ref 0.1–1.0)
Monocytes Relative: 7 %
Neutro Abs: 5.6 10*3/uL (ref 1.7–7.7)
Neutrophils Relative %: 79 %
Platelets: 138 10*3/uL — ABNORMAL LOW (ref 150–400)
RBC: 3.12 MIL/uL — ABNORMAL LOW (ref 3.87–5.11)
RDW: 16.4 % — ABNORMAL HIGH (ref 11.5–15.5)
WBC: 7.1 10*3/uL (ref 4.0–10.5)
nRBC: 0 % (ref 0.0–0.2)

## 2021-12-18 LAB — GLUCOSE, CAPILLARY
Glucose-Capillary: 133 mg/dL — ABNORMAL HIGH (ref 70–99)
Glucose-Capillary: 176 mg/dL — ABNORMAL HIGH (ref 70–99)

## 2021-12-18 LAB — TYPE AND SCREEN
ABO/RH(D): O POS
Antibody Screen: NEGATIVE

## 2021-12-18 MED ORDER — SODIUM CHLORIDE 0.9% FLUSH
3.0000 mL | Freq: Two times a day (BID) | INTRAVENOUS | Status: DC
  Administered 2021-12-18 – 2022-01-22 (×65): 3 mL via INTRAVENOUS

## 2021-12-18 NOTE — Progress Notes (Addendum)
Called to patient's room with patient c/o feeling like her blood sugar is low.  CBG with hospital meter 65.  Gave greek yogurt and one apple juice.  Will recheck CBG in 15 minutes. ? ?Follow Up CBG at 13:15pm was 101. ?

## 2021-12-18 NOTE — Progress Notes (Signed)
2 hours after breakfast (11:50am) CBG with unit meter was 90.  Joann Meyers Libre reading with patient's equipment was 60.   ?
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
## 2021-12-19 DIAGNOSIS — O42112 Preterm premature rupture of membranes, onset of labor more than 24 hours following rupture, second trimester: Secondary | ICD-10-CM | POA: Diagnosis not present

## 2021-12-19 LAB — GLUCOSE, CAPILLARY
Glucose-Capillary: 101 mg/dL — ABNORMAL HIGH (ref 70–99)
Glucose-Capillary: 102 mg/dL — ABNORMAL HIGH (ref 70–99)
Glucose-Capillary: 105 mg/dL — ABNORMAL HIGH (ref 70–99)
Glucose-Capillary: 121 mg/dL — ABNORMAL HIGH (ref 70–99)
Glucose-Capillary: 129 mg/dL — ABNORMAL HIGH (ref 70–99)
Glucose-Capillary: 133 mg/dL — ABNORMAL HIGH (ref 70–99)
Glucose-Capillary: 56 mg/dL — ABNORMAL LOW (ref 70–99)
Glucose-Capillary: 59 mg/dL — ABNORMAL LOW (ref 70–99)
Glucose-Capillary: 65 mg/dL — ABNORMAL LOW (ref 70–99)
Glucose-Capillary: 68 mg/dL — ABNORMAL LOW (ref 70–99)
Glucose-Capillary: 81 mg/dL (ref 70–99)
Glucose-Capillary: 88 mg/dL (ref 70–99)
Glucose-Capillary: 89 mg/dL (ref 70–99)
Glucose-Capillary: 90 mg/dL (ref 70–99)
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
## 2021-12-19 NOTE — Progress Notes (Signed)
CBG 88.  Patient stated feel much better. ?

## 2021-12-19 NOTE — Progress Notes (Addendum)
Patient stated she felt dizzy.  CBG 56.  Patient did not eat HS snack.  Snack given with drink.  Will recheck CBG . ?

## 2021-12-19 NOTE — Progress Notes (Incomplete)
Daily Antepartum Note  Admission Date: 12/08/2021 Current Date: 12/19/2021 11:23 AM  Joann Meyers is a 36 y.o. G2P0101 @ 14w4dby ***, HD#***, admitted for ***.  Pregnancy complicated by: *** Patient Active Problem List   Diagnosis Date Noted   [redacted] weeks gestation of pregnancy 12/12/2021   BMI 38.0-38.9,adult 11/15/2021   Abnormal genetic test during pregnancy 11/15/2021   Type 2 diabetes mellitus without complication, without long-term current use of insulin ([place] 11/15/2021   Preterm premature rupture of membranes (PPROM) at 164weeks gestation, antepartum 11/14/2021   Placenta previa 11/09/2021   Red Chart Rounds Patient 10/20/2021   Proteinuria affecting pregnancy, antepartum 09/17/2021   Rubella non-immune status, antepartum 09/16/2021   Anemia affecting pregnancy in second trimester 09/16/2021   History of severe preeclampsia, prior pregnancy, currently pregnant 09/15/2021   Previous cesarean delivery, antepartum 09/15/2021   Migraine headache 08/04/2020   Benign gestational thrombocytopenia in second trimester ([place] 07/15/2020   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 ([place] 07/15/2020   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2 F (36.8 C) (03/06 0829) Pulse Rate:  [89-98] 98 (03/06 0520) Resp:  [16-17] 17 (03/06 0829) BP: (124-139)/(58-72) 135/61  (03/06 0829) SpO2:  [99 %-100 %] 99 % (03/06 0829) Temp (24hrs), Avg:98.1 F (36.7 C), Min:98 F (36.7 C), Max:98.2 F (36.8 C)  No intake or output data in the 24 hours ending 12/19/21 1123   Current Vital Signs 24h Vital Sign Ranges  T 98.2 F (36.8 C) Temp  Avg: 98.1 F (36.7 C)  Min: 98 F (36.7 C)  Max: 98.2 F (36.8 C)  BP 135/61 BP  Min: 124/58  Max: 139/72  HR 98 Pulse  Avg: 94  Min: 89  Max: 98  RR 17 Resp  Avg: 16.8  Min: 16  Max: 17  SaO2 99 %  (Room Air) SpO2  Avg: 99.2 %  Min: 99 %  Max: 100 %       24 Hour I/O Current Shift I/O  Time Ins Outs No intake/output data recorded. No intake/output data recorded.   Patient Vitals for the past 24 hrs:  BP Temp Temp src Pulse Resp SpO2  12/19/21 0829 135/61 98.2 F (36.8 C) Oral -- 17 99 %  12/19/21 0520 (!) 124/58 98.2 F (36.8 C) Oral 98 16 99 %  12/18/21 2331 125/63 98 F (36.7 C) Oral 89 17 100 %  12/18/21 2142 -- -- -- 95 -- --  12/18/21 2030 -- -- -- -- -- 99 %  12/18/21 2029 139/72 98.1 F (36.7 C) Oral 94 17 99 %    Fetal Heart Tones: *** Tocometry: ***  Physical exam: General: Well nourished, well developed female in no acute distress. Abdomen: gravid *** Cardiovascular: {EXAM; HPVXYI:[id number]}Respiratory: CTAB Extremities: no clubbing, cyanosis or edema Skin: Warm and dry.   Medications: Current Facility-Administered  Medications  Medication Dose Route Frequency Provider Last Rate Last Admin   acetaminophen (TYLENOL) tablet 650 mg  650 mg Oral Q4H PRN Woodroe Mode, MD   650 mg at 12/13/21 8185   aspirin EC tablet 81 mg  81 mg Oral Daily Woodroe Mode, MD   81 mg at 12/19/21 0946   calcium carbonate (TUMS - dosed in mg elemental calcium) chewable tablet 400 mg of elemental calcium  2 tablet Oral Q4H PRN Woodroe Mode, MD   400 mg of elemental calcium at 12/18/21 1028   docusate sodium (COLACE) capsule 100 mg  100 mg Oral BID Janyth Pupa, DO   100 mg at 12/19/21 0945   ferrous gluconate  (FERGON) tablet 324 mg  324 mg Oral Q breakfast Anyanwu, Ugonna A, MD   324 mg at 12/19/21 6314   hydrocortisone-pramoxine (PROCTOFOAM-HC) rectal foam 1 applicator  1 applicator Rectal BID PRN Janyth Pupa, DO       insulin aspart (novoLOG) injection 0-12 Units  0-12 Units Subcutaneous UD Aletha Halim, MD       insulin aspart (novoLOG) injection 0-24 Units  0-24 Units Subcutaneous TID PC Florian Buff, MD   6 Units at 12/18/21 2139   insulin aspart (novoLOG) injection 30 Units  30 Units Subcutaneous TID WC Radene Gunning, MD   30 Units at 12/19/21 0915   insulin glargine-yfgn (SEMGLEE) injection 24 Units  24 Units Subcutaneous BID Radene Gunning, MD   24 Units at 12/18/21 2138   labetalol (NORMODYNE) tablet 300 mg  300 mg Oral BID Woodroe Mode, MD   300 mg at 12/19/21 0945   lactated ringers infusion   Intravenous Continuous Woodroe Mode, MD 10 mL/hr at 12/10/21 0235 New Bag at 12/10/21 0235   metFORMIN (GLUCOPHAGE) tablet 1,000 mg  1,000 mg Oral BID WC Woodroe Mode, MD   1,000 mg at 12/19/21 0916   NIFEdipine (PROCARDIA-XL/NIFEDICAL-XL) 24 hr tablet 30 mg  30 mg Oral Daily Woodroe Mode, MD   30 mg at 12/19/21 0946   ondansetron (ZOFRAN-ODT) disintegrating tablet 4 mg  4 mg Oral Q8H PRN Janyth Pupa, DO   4 mg at 12/18/21 1028   pantoprazole (PROTONIX) EC tablet 40 mg  40 mg Oral Daily Woodroe Mode, MD   40 mg at 12/19/21 0946   polyethylene glycol (MIRALAX / GLYCOLAX) packet 17 g  17 g Oral Daily PRN Janyth Pupa, DO       prenatal vitamin w/FE, FA (NATACHEW) chewable tablet 1 tablet  1 tablet Oral Q1200 Woodroe Mode, MD   1 tablet at 12/18/21 9702   sodium chloride flush (NS) 0.9 % injection 3 mL  3 mL Intravenous Q12H Janyth Pupa, DO   3 mL at 12/19/21 0946   zolpidem (AMBIEN) tablet 5 mg  5 mg Oral QHS PRN Woodroe Mode, MD        Labs:  Recent Labs  Lab 12/15/21 0427 12/18/21 0556  WBC 7.4 7.1  HGB 9.0* 8.5*  HCT 26.0* 26.0*  PLT 145* 138*    Recent  Labs  Lab 12/15/21 0427 12/18/21 0556  NA 135 138  K 3.8 4.0  CL 104 106  CO2 22 23  BUN 16 18  CREATININE 0.53 0.57  CALCIUM 8.8* 9.3  PROT 5.5* 5.4*  BILITOT 0.2* 0.1*  ALKPHOS 36* 48  ALT 14 17  AST 16 17  GLUCOSE 132* 126*     Radiology:  ***  Assessment &  Plan:  *** *Pregnancy:*** * *** *Preterm: *** *PPx: *** *FEN/GI: *** *Dispo: ***  Durene Romans MD Attending [facility]) GYN Consult Phone: [phone] (M-F, [phone]) & [phone]  (Off hours, weekends, holidays)

## 2021-12-20 ENCOUNTER — Encounter (HOSPITAL_COMMUNITY): Payer: Self-pay | Admitting: Obstetrics & Gynecology

## 2021-12-20 DIAGNOSIS — O329XX0 Maternal care for malpresentation of fetus, unspecified, not applicable or unspecified: Secondary | ICD-10-CM | POA: Diagnosis present

## 2021-12-20 DIAGNOSIS — O42112 Preterm premature rupture of membranes, onset of labor more than 24 hours following rupture, second trimester: Secondary | ICD-10-CM | POA: Diagnosis not present

## 2021-12-20 LAB — GLUCOSE, CAPILLARY
Glucose-Capillary: 79 mg/dL (ref 70–99)
Glucose-Capillary: 92 mg/dL (ref 70–99)
Glucose-Capillary: 127 mg/dL — ABNORMAL HIGH (ref 70–99)
Glucose-Capillary: 141 mg/dL — ABNORMAL HIGH (ref 70–99)
Glucose-Capillary: 208 mg/dL — ABNORMAL HIGH (ref 70–99)
Glucose-Capillary: 57 mg/dL — ABNORMAL LOW (ref 70–99)
Glucose-Capillary: 125 mg/dL — ABNORMAL HIGH (ref 70–99)

## 2021-12-20 MED ORDER — INSULIN ASPART 100 UNIT/ML IJ SOLN
0.0000 [IU] | Freq: Three times a day (TID) | INTRAMUSCULAR | Status: DC
  Administered 2021-12-20: 4 [IU] via SUBCUTANEOUS
  Administered 2021-12-20 – 2021-12-21 (×2): 2 [IU] via SUBCUTANEOUS
  Administered 2021-12-21: 3 [IU] via SUBCUTANEOUS
  Administered 2021-12-24 – 2021-12-28 (×8): 1 [IU] via SUBCUTANEOUS
  Administered 2021-12-29: 4 [IU] via SUBCUTANEOUS
  Administered 2021-12-29: 2 [IU] via SUBCUTANEOUS
  Administered 2021-12-29: 3 [IU] via SUBCUTANEOUS
  Administered 2021-12-30: 1 [IU] via SUBCUTANEOUS
  Administered 2021-12-30 (×2): 2 [IU] via SUBCUTANEOUS
  Administered 2021-12-31: 1 [IU] via SUBCUTANEOUS
  Administered 2021-12-31: 2 [IU] via SUBCUTANEOUS
  Administered 2022-01-01 (×2): 1 [IU] via SUBCUTANEOUS
  Administered 2022-01-02: 2 [IU] via SUBCUTANEOUS
  Administered 2022-01-04: 1 [IU] via SUBCUTANEOUS
  Administered 2022-01-05 (×2): 2 [IU] via SUBCUTANEOUS
  Administered 2022-01-06: 1 [IU] via SUBCUTANEOUS
  Administered 2022-01-07: 3 [IU] via SUBCUTANEOUS
  Administered 2022-01-08: 2 [IU] via SUBCUTANEOUS
  Administered 2022-01-09 – 2022-01-10 (×2): 1 [IU] via SUBCUTANEOUS
  Administered 2022-01-10 – 2022-01-11 (×3): 2 [IU] via SUBCUTANEOUS
  Administered 2022-01-12 – 2022-01-13 (×5): 1 [IU] via SUBCUTANEOUS
  Administered 2022-01-14: 4 [IU] via SUBCUTANEOUS
  Administered 2022-01-14: 1 [IU] via SUBCUTANEOUS
  Administered 2022-01-14: 2 [IU] via SUBCUTANEOUS
  Administered 2022-01-15 (×2): 1 [IU] via SUBCUTANEOUS
  Administered 2022-01-15: 2 [IU] via SUBCUTANEOUS
  Administered 2022-01-16: 3 [IU] via SUBCUTANEOUS
  Administered 2022-01-16 – 2022-01-17 (×2): 1 [IU] via SUBCUTANEOUS
  Administered 2022-01-17 – 2022-01-18 (×3): 2 [IU] via SUBCUTANEOUS
  Administered 2022-01-18 (×2): 1 [IU] via SUBCUTANEOUS
  Administered 2022-01-19: 2 [IU] via SUBCUTANEOUS
  Administered 2022-01-19: 3 [IU] via SUBCUTANEOUS
  Administered 2022-01-19 – 2022-01-20 (×2): 2 [IU] via SUBCUTANEOUS
  Administered 2022-01-20: 1 [IU] via SUBCUTANEOUS
  Administered 2022-01-20: 4 [IU] via SUBCUTANEOUS
  Administered 2022-01-21 (×3): 1 [IU] via SUBCUTANEOUS
  Administered 2022-01-22: 2 [IU] via SUBCUTANEOUS
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
## 2021-12-20 NOTE — Progress Notes (Signed)
Inpatient Diabetes Program Recommendations ? ?AACE/ADA: New Consensus Statement on Inpatient Glycemic Control (2015) ? ?Target Ranges:  Prepandial:   less than 140 mg/dL ?     Peak postprandial:   less than 180 mg/dL (1-2 hours) ?     Critically ill patients:  140 - 180 mg/dL  ? ?Lab Results  ?Component Value Date  ? GLUCAP 141 (H) 12/20/2021  ? HGBA1C 7.4 (H) 09/15/2021  ? ? ?Review of Glycemic Control ? Latest Reference Range & Units 12/19/21 21:30 12/19/21 23:08 12/19/21 23:32 12/20/21 06:06 12/20/21 10:34  ?Glucose-Capillary 70 - 99 mg/dL 68 (L) 56 (L) 88 127 (H) 141 (H)  ?(L): Data is abnormally low ?(H): Data is abnormally high ?Diabetes history: Type 2 DM ?Outpatient Diabetes medications: Novolog 18 units TID, Lantus 14 units BID, Metformin 1000 mg BID ?Current orders for Inpatient glycemic control: Novolog 0-24 units TID, Novolog 30 units TID, Semglee 24 units BID ? ?Inpatient Diabetes Program Recommendations:   ? ?Noted hypoglycemia yesterday of 56 mg/dL. Of note patient received Semglee dose later than usual, missed bedtime snack and basal was administered with CBG of 68 mg/dL. Lacks consistency in glucose trends, in retrospect.  ?Would recommend changing sliding scale to Novolog 0-14 units TID and discontinuing additional custom scale PRN.  ?Discussed with Dr Ilda Basset.  ? ?Thanks, ?Bronson Curb, MSN, RNC-OB ?Diabetes Coordinator ?[phone] (8a-5p) ? ? ? ? ?

## 2021-12-20 NOTE — Progress Notes (Signed)
CBG 125. ?

## 2021-12-20 NOTE — Progress Notes (Signed)
CBG 57, Dr [name] notified.  Stated to go ahead and give HS dose of  Semglee 24unitsSQ as ordered.  Will recheck CBG. ?

## 2021-12-20 NOTE — Plan of Care (Signed)
?  Problem: Education: ?Goal: Knowledge of the prescribed therapeutic regimen will improve ?Outcome: Completed/Met ?  ?Problem: Tissue Perfusion: ?Goal: Adequacy of tissue perfusion will improve ?Outcome: Completed/Met ?  ?

## 2021-12-21 DIAGNOSIS — O42112 Preterm premature rupture of membranes, onset of labor more than 24 hours following rupture, second trimester: Secondary | ICD-10-CM | POA: Diagnosis not present

## 2021-12-21 LAB — GLUCOSE, CAPILLARY
Glucose-Capillary: 110 mg/dL — ABNORMAL HIGH (ref 70–99)
Glucose-Capillary: 139 mg/dL — ABNORMAL HIGH (ref 70–99)
Glucose-Capillary: 172 mg/dL — ABNORMAL HIGH (ref 70–99)
Glucose-Capillary: 52 mg/dL — ABNORMAL LOW (ref 70–99)
Glucose-Capillary: 66 mg/dL — ABNORMAL LOW (ref 70–99)
Glucose-Capillary: 79 mg/dL (ref 70–99)
Glucose-Capillary: 91 mg/dL (ref 70–99)
Glucose-Capillary: 99 mg/dL (ref 70–99)

## 2021-12-21 LAB — COMPREHENSIVE METABOLIC PANEL
ALT: 18 U/L (ref 0–44)
AST: 19 U/L (ref 15–41)
Albumin: 2.5 g/dL — ABNORMAL LOW (ref 3.5–5.0)
Alkaline Phosphatase: 51 U/L (ref 38–126)
Anion gap: 9 (ref 5–15)
BUN: 13 mg/dL (ref 6–20)
CO2: 21 mmol/L — ABNORMAL LOW (ref 22–32)
Calcium: 8.6 mg/dL — ABNORMAL LOW (ref 8.9–10.3)
Chloride: 107 mmol/L (ref 98–111)
Creatinine, Ser: 0.58 mg/dL (ref 0.44–1.00)
GFR, Estimated: >60 mL/min (ref >60)
Glucose, Bld: 98 mg/dL (ref 70–99)
Potassium: 3.7 mmol/L (ref 3.5–5.1)
Sodium: 137 mmol/L (ref 135–145)
Total Bilirubin: 0.4 mg/dL (ref 0.3–1.2)
Total Protein: 5.4 g/dL — ABNORMAL LOW (ref 6.5–8.1)

## 2021-12-21 LAB — CBC WITH DIFFERENTIAL/PLATELET
Abs Immature Granulocytes: 0.07 10*3/uL (ref 0.00–0.07)
Basophils Absolute: 0 10*3/uL (ref 0.0–0.1)
Basophils Relative: 0 %
Eosinophils Absolute: 0.1 10*3/uL (ref 0.0–0.5)
Eosinophils Relative: 2 %
HCT: 23.2 % — ABNORMAL LOW (ref 36.0–46.0)
Hemoglobin: 7.7 g/dL — ABNORMAL LOW (ref 12.0–15.0)
Immature Granulocytes: 1 %
Lymphocytes Relative: 12 %
Lymphs Abs: 0.8 10*3/uL (ref 0.7–4.0)
MCH: 27.7 pg (ref 26.0–34.0)
MCHC: 33.2 g/dL (ref 30.0–36.0)
MCV: 83.5 fL (ref 80.0–100.0)
Monocytes Absolute: 0.5 10*3/uL (ref 0.1–1.0)
Monocytes Relative: 7 %
Neutro Abs: 5.7 10*3/uL (ref 1.7–7.7)
Neutrophils Relative %: 78 %
Platelets: 132 10*3/uL — ABNORMAL LOW (ref 150–400)
RBC: 2.78 MIL/uL — ABNORMAL LOW (ref 3.87–5.11)
RDW: 16.4 % — ABNORMAL HIGH (ref 11.5–15.5)
WBC: 7.3 10*3/uL (ref 4.0–10.5)
nRBC: 0 % (ref 0.0–0.2)

## 2021-12-21 LAB — IRON AND TIBC
Iron: 73 ug/dL (ref 28–170)
Saturation Ratios: 16 % (ref 10.4–31.8)
TIBC: 466 ug/dL — ABNORMAL HIGH (ref 250–450)
UIBC: 393 ug/dL

## 2021-12-21 LAB — TYPE AND SCREEN
ABO/RH(D): O POS
Antibody Screen: NEGATIVE

## 2021-12-21 LAB — RETICULOCYTES
Immature Retic Fract: 22.9 % — ABNORMAL HIGH (ref 2.3–15.9)
RBC.: 2.79 MIL/uL — ABNORMAL LOW (ref 3.87–5.11)
Retic Count, Absolute: 125.3 10*3/uL (ref 19.0–186.0)
Retic Ct Pct: 4.5 % — ABNORMAL HIGH (ref 0.4–3.1)

## 2021-12-21 LAB — FOLATE: Folate: 18.4 ng/mL (ref >5.9)

## 2021-12-21 LAB — VITAMIN B12: Vitamin B-12: 177 pg/mL — ABNORMAL LOW (ref 180–914)

## 2021-12-21 LAB — FERRITIN: Ferritin: 30 ng/mL (ref 11–307)

## 2021-12-21 MED ORDER — INSULIN ASPART 100 UNIT/ML IJ SOLN: 25.0000 [IU] | Freq: Every day | INTRAMUSCULAR | Status: DC

## 2021-12-21 MED ORDER — INSULIN ASPART 100 UNIT/ML IJ SOLN: 30.0000 [IU] | Freq: Two times a day (BID) | INTRAMUSCULAR | Status: DC

## 2021-12-21 MED ORDER — INSULIN ASPART 100 UNIT/ML IJ SOLN
25.0000 [IU] | Freq: Three times a day (TID) | INTRAMUSCULAR | Status: DC
  Administered 2021-12-21 – 2021-12-22 (×5): 25 [IU] via SUBCUTANEOUS

## 2021-12-21 NOTE — Progress Notes (Signed)
CBG due at 2000 will be taken at 2100, per 2hrpp order, as pt dinner was provided at 1900.  ?

## 2021-12-21 NOTE — Progress Notes (Signed)
Hypoglycemic Event ? ?CBG: 52 ? ?Treatment: 4 oz juice/soda ? ?Symptoms: Pale, Sweaty, and Shaky ? ?Follow-up CBG: Time:1314 CBG Result:66 ? ?Possible Reasons for Event: Medication regimen:   ? ?Comments/MD notified:Snack provided ? ? ? ? ?Dewaine Oats ? ? ?

## 2021-12-21 NOTE — Progress Notes (Addendum)
Inpatient Diabetes Program Recommendations ? ?AACE/ADA: New Consensus Statement on Inpatient Glycemic Control (2015) ? ?Target Ranges:  Prepandial:   less than 140 mg/dL ?     Peak postprandial:   less than 180 mg/dL (1-2 hours) ?     Critically ill patients:  140 - 180 mg/dL  ? ?Lab Results  ?Component Value Date  ? GLUCAP 110 (H) 12/21/2021  ? HGBA1C 7.4 (H) 09/15/2021  ? ? ?Review of Glycemic Control ? Latest Reference Range & Units 12/20/21 16:03 12/20/21 22:00 12/20/21 23:02 12/21/21 00:27 12/21/21 06:12  ?Glucose-Capillary 70 - 99 mg/dL 208 (H) 57 (L) 125 (H) 99 110 (H)  ?(H): Data is abnormally high ?(L): Data is abnormally low ?Diabetes history: Type 2 DM ?Outpatient Diabetes medications: Novolog 18 units TID, Lantus 14 units BID, Metformin 1000 mg BID ?Current orders for Inpatient glycemic control: Novolog 0-14 units TID, Novolog 30 units TID, Semglee 24 units BID ?  ?Inpatient Diabetes Program Recommendations:   ?  ?Consider decreasing Novolog 25 units QPM with dinner. Continue with Novolog 30 units with breakfast and lunch.  ? ?Addendum '@1424'$ : Spoke with patient regarding AM intake given hypoglycemic episode. Per RN and patient she consumed ~30 CHO. Given this would decrease Novolog to 25 units TID. Patient has snack at bedside. Anticipate based on CHO intake for intervention of hypoglycemic episode, glucose trend to increase. Discussed with Dr Ilda Basset ? ?Thanks, ?Bronson Curb, MSN, RNC-OB ?Diabetes Coordinator ?[phone] (8a-5p) ? ? ? ? ?
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
## 2021-12-22 ENCOUNTER — Inpatient Hospital Stay (HOSPITAL_BASED_OUTPATIENT_CLINIC_OR_DEPARTMENT_OTHER): Payer: Medicaid Other

## 2021-12-22 DIAGNOSIS — O09292 Supervision of pregnancy with other poor reproductive or obstetric history, second trimester: Secondary | ICD-10-CM | POA: Diagnosis not present

## 2021-12-22 DIAGNOSIS — O10019 Pre-existing essential hypertension complicating pregnancy, unspecified trimester: Secondary | ICD-10-CM

## 2021-12-22 DIAGNOSIS — Z3A25 25 weeks gestation of pregnancy: Secondary | ICD-10-CM | POA: Diagnosis not present

## 2021-12-22 DIAGNOSIS — O42112 Preterm premature rupture of membranes, onset of labor more than 24 hours following rupture, second trimester: Secondary | ICD-10-CM | POA: Diagnosis not present

## 2021-12-22 DIAGNOSIS — O42912 Preterm premature rupture of membranes, unspecified as to length of time between rupture and onset of labor, second trimester: Secondary | ICD-10-CM | POA: Diagnosis not present

## 2021-12-22 LAB — GLUCOSE, CAPILLARY
Glucose-Capillary: 101 mg/dL — ABNORMAL HIGH (ref 70–99)
Glucose-Capillary: 80 mg/dL (ref 70–99)
Glucose-Capillary: 72 mg/dL (ref 70–99)
Glucose-Capillary: 46 mg/dL — ABNORMAL LOW (ref 70–99)
Glucose-Capillary: 53 mg/dL — ABNORMAL LOW (ref 70–99)
Glucose-Capillary: 81 mg/dL (ref 70–99)

## 2021-12-22 MED ORDER — INSULIN GLARGINE-YFGN 100 UNIT/ML ~~LOC~~ SOLN: 22.0000 [IU] | Freq: Two times a day (BID) | SUBCUTANEOUS | Status: DC

## 2021-12-22 MED ORDER — INSULIN ASPART 100 UNIT/ML IJ SOLN: 22.0000 [IU] | Freq: Three times a day (TID) | INTRAMUSCULAR | Status: DC

## 2021-12-22 MED ORDER — INSULIN GLARGINE-YFGN 100 UNIT/ML ~~LOC~~ SOLN
24.0000 [IU] | Freq: Two times a day (BID) | SUBCUTANEOUS | Status: DC
  Administered 2021-12-23 – 2021-12-29 (×13): 24 [IU] via SUBCUTANEOUS
  Filled 2021-12-22 (×15): qty 0.24

## 2021-12-22 NOTE — Progress Notes (Addendum)
RN at bedside. Patient called out complaining of dizziness and feeling hot. Patient given a banana and sprite. BS reassessed and is now 57. Patient is alert and responsive. Currently eating and drinking. Will reassess blood sugar in 15 min. ?

## 2021-12-22 NOTE — Progress Notes (Signed)
RN assessed blood sugar at 2055, CGM was 55. RN used our glucometer and BS was 45 on the right hand. BS was rechecked and was 46 with the left hand. Patient states that she received 30 units of insulin around 1900. The MAR does not reflect that insulin was given. Patient states that she feels fine and she knows when her sugar is dropping because she starts sweating. RN gave the patient sprite and will reassess in 15 mins.  ?
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
## 2021-12-23 LAB — GLUCOSE, CAPILLARY
Glucose-Capillary: 102 mg/dL — ABNORMAL HIGH (ref 70–99)
Glucose-Capillary: 111 mg/dL — ABNORMAL HIGH (ref 70–99)
Glucose-Capillary: 63 mg/dL — ABNORMAL LOW (ref 70–99)
Glucose-Capillary: 66 mg/dL — ABNORMAL LOW (ref 70–99)
Glucose-Capillary: 72 mg/dL (ref 70–99)
Glucose-Capillary: 75 mg/dL (ref 70–99)
Glucose-Capillary: 81 mg/dL (ref 70–99)

## 2021-12-23 MED ORDER — INSULIN ASPART 100 UNIT/ML IJ SOLN
20.0000 [IU] | Freq: Three times a day (TID) | INTRAMUSCULAR | Status: DC
  Administered 2021-12-23 – 2021-12-29 (×20): 20 [IU] via SUBCUTANEOUS

## 2021-12-23 NOTE — Progress Notes (Signed)
Hypoglycemic Event ? ?CBG: 66 at 2101 ? ?2000 CBG assessed at 2101 as meal was given at 1900, per 2hrpp order, CBG was due to be assessed at 2100.  ? ?Treatment: 4 oz juice/soda and 2 packets of crackers  ? ?Symptoms: None ? ?Follow-up CBG: Time: 2121  ? ?(patient was in restroom and did not eat right away, CBG recheck was assessed 15 min from food consumption)  ? ?CBG Result: 63 ? ?Treatment repeated: ? ?Treatment: 4 oz juice/soda and sandwich  ? ?Follow-up CBG: Time: 2133    ?CBG Result: 72 ? ?Possible Reasons for Event: Inadequate meal intake ? ? ? ?Comments/MD notified: Notified of event and protocol orders followed  ? ? ? ?Shanon Payor, RN ? ? ?
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
## 2021-12-24 ENCOUNTER — Encounter (HOSPITAL_COMMUNITY): Payer: Self-pay | Admitting: Obstetrics & Gynecology

## 2021-12-24 DIAGNOSIS — O42112 Preterm premature rupture of membranes, onset of labor more than 24 hours following rupture, second trimester: Secondary | ICD-10-CM | POA: Diagnosis not present

## 2021-12-24 LAB — GLUCOSE, CAPILLARY
Glucose-Capillary: 109 mg/dL — ABNORMAL HIGH (ref 70–99)
Glucose-Capillary: 108 mg/dL — ABNORMAL HIGH (ref 70–99)
Glucose-Capillary: 73 mg/dL (ref 70–99)
Glucose-Capillary: 83 mg/dL (ref 70–99)

## 2021-12-24 LAB — TYPE AND SCREEN
ABO/RH(D): O POS
Antibody Screen: NEGATIVE
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
## 2021-12-25 DIAGNOSIS — O42112 Preterm premature rupture of membranes, onset of labor more than 24 hours following rupture, second trimester: Secondary | ICD-10-CM | POA: Diagnosis not present

## 2021-12-25 LAB — GLUCOSE, CAPILLARY
Glucose-Capillary: 109 mg/dL — ABNORMAL HIGH (ref 70–99)
Glucose-Capillary: 112 mg/dL — ABNORMAL HIGH (ref 70–99)
Glucose-Capillary: 49 mg/dL — ABNORMAL LOW (ref 70–99)
Glucose-Capillary: 61 mg/dL — ABNORMAL LOW (ref 70–99)
Glucose-Capillary: 75 mg/dL (ref 70–99)
Glucose-Capillary: 85 mg/dL (ref 70–99)
Glucose-Capillary: 86 mg/dL (ref 70–99)

## 2021-12-25 MED ORDER — IRON SUCROSE 20 MG/ML IV SOLN
300.0000 mg | Freq: Once | INTRAVENOUS | Status: DC
  Filled 2021-12-25: qty 15

## 2021-12-25 MED ORDER — FAMOTIDINE 20 MG PO TABS
20.0000 mg | ORAL_TABLET | Freq: Two times a day (BID) | ORAL | Status: DC | PRN
  Administered 2021-12-28: 20 mg via ORAL
  Filled 2021-12-25 (×2): qty 1
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
## 2021-12-25 NOTE — Progress Notes (Signed)
Patient is refusing the Venofer. She says it wasn't just the back and leg pain. She says she also had numbness up through out her spine and says it cause her to have a seizure.  ?

## 2021-12-26 LAB — GLUCOSE, CAPILLARY
Glucose-Capillary: 104 mg/dL — ABNORMAL HIGH (ref 70–99)
Glucose-Capillary: 111 mg/dL — ABNORMAL HIGH (ref 70–99)
Glucose-Capillary: 93 mg/dL (ref 70–99)
Glucose-Capillary: 90 mg/dL (ref 70–99)
Glucose-Capillary: 102 mg/dL — ABNORMAL HIGH (ref 70–99)
Glucose-Capillary: 89 mg/dL (ref 70–99)
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
## 2021-12-26 NOTE — Progress Notes (Signed)
Discussed birth control with pt - she declines. She does not plan to use condoms.  ? ?We discussed tdap at 27w. She would like to read about it. We discussed neonatal benefit especially in light of potential pulmonary compromise in the s/o previable PPROM.  ? ?Discussed RPR/HIV at 27w.  ? ?Radene Gunning, MD ?Attending [place], Faculty Practice ?Center for [place] ? ? ?

## 2021-12-27 DIAGNOSIS — O42112 Preterm premature rupture of membranes, onset of labor more than 24 hours following rupture, second trimester: Secondary | ICD-10-CM | POA: Diagnosis not present

## 2021-12-27 LAB — GLUCOSE, CAPILLARY
Glucose-Capillary: 60 mg/dL — ABNORMAL LOW (ref 70–99)
Glucose-Capillary: 64 mg/dL — ABNORMAL LOW (ref 70–99)
Glucose-Capillary: 87 mg/dL (ref 70–99)
Glucose-Capillary: 89 mg/dL (ref 70–99)
Glucose-Capillary: 90 mg/dL (ref 70–99)
Glucose-Capillary: 92 mg/dL (ref 70–99)
Glucose-Capillary: 93 mg/dL (ref 70–99)

## 2021-12-27 NOTE — Progress Notes (Signed)
NST reviewed.  ? ?Fetal monitoring: FHR: 145 bpm, Variability: moderate, Accelerations: Appropriate for GA, Decelerations: occasional variable  ?Uterine activity: none ? ?Radene Gunning, MD ?Attending [place], Faculty Practice ?Center for [place] ? ? ?
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
## 2021-12-28 ENCOUNTER — Encounter (HOSPITAL_COMMUNITY): Payer: Self-pay | Admitting: Obstetrics & Gynecology

## 2021-12-28 LAB — CBC
HCT: 23 % — ABNORMAL LOW (ref 36.0–46.0)
Hemoglobin: 7.8 g/dL — ABNORMAL LOW (ref 12.0–15.0)
MCH: 28.3 pg (ref 26.0–34.0)
MCHC: 33.9 g/dL (ref 30.0–36.0)
MCV: 83.3 fL (ref 80.0–100.0)
Platelets: 144 10*3/uL — ABNORMAL LOW (ref 150–400)
RBC: 2.76 MIL/uL — ABNORMAL LOW (ref 3.87–5.11)
RDW: 16.9 % — ABNORMAL HIGH (ref 11.5–15.5)
WBC: 6.9 10*3/uL (ref 4.0–10.5)
nRBC: 0 % (ref 0.0–0.2)

## 2021-12-28 LAB — GLUCOSE, CAPILLARY
Glucose-Capillary: 117 mg/dL — ABNORMAL HIGH (ref 70–99)
Glucose-Capillary: 109 mg/dL — ABNORMAL HIGH (ref 70–99)
Glucose-Capillary: 113 mg/dL — ABNORMAL HIGH (ref 70–99)
Glucose-Capillary: 61 mg/dL — ABNORMAL LOW (ref 70–99)
Glucose-Capillary: 89 mg/dL (ref 70–99)
Glucose-Capillary: 94 mg/dL (ref 70–99)

## 2021-12-28 LAB — TYPE AND SCREEN
ABO/RH(D): O POS
Antibody Screen: NEGATIVE

## 2021-12-28 NOTE — Progress Notes (Signed)
Inpatient Diabetes Program Recommendations ? ?AACE/ADA: New Consensus Statement on Inpatient Glycemic Control (2015) ? ?Target Ranges:  Prepandial:   less than 140 mg/dL ?     Peak postprandial:   less than 180 mg/dL (1-2 hours) ?     Critically ill patients:  140 - 180 mg/dL  ? ?Lab Results  ?Component Value Date  ? GLUCAP 117 (H) 12/28/2021  ? HGBA1C 7.4 (H) 09/15/2021  ? ? ?Review of Glycemic Control ? Latest Reference Range & Units 12/27/21 06:27 12/27/21 11:59 12/27/21 14:05 12/27/21 17:17 12/27/21 21:53 12/27/21 22:21 12/27/21 23:48 12/28/21 06:16  ?Glucose-Capillary 70 - 99 mg/dL 90 87 92 93 60 (L) 64 (L) 89 117 (H)  ?(L): Data is abnormally low ?(H): Data is abnormally high ? ?Current orders for Inpatient glycemic control:  ?Semglee 24 units QD, Novolog 0-14 units TID and Novolog 20 units TID with meals ? ?Continue to follow patients glucose trends.  Agree with current regimen.  Has occasional mild lows but would not change anything at this point.  Will continue to follow daily.   ? ?Thank you, ?Reche Dixon, MSN, RN ?Diabetes Coordinator ?Inpatient Diabetes Program ?[phone] (team pager from 8a-5p) ? ? ? ? ? ?
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
## 2021-12-29 LAB — GLUCOSE, CAPILLARY
Glucose-Capillary: 111 mg/dL — ABNORMAL HIGH (ref 70–99)
Glucose-Capillary: 167 mg/dL — ABNORMAL HIGH (ref 70–99)
Glucose-Capillary: 122 mg/dL — ABNORMAL HIGH (ref 70–99)
Glucose-Capillary: 37 mg/dL — CL (ref 70–99)
Glucose-Capillary: 53 mg/dL — ABNORMAL LOW (ref 70–99)
Glucose-Capillary: 91 mg/dL (ref 70–99)
Glucose-Capillary: 227 mg/dL — ABNORMAL HIGH (ref 70–99)

## 2021-12-29 MED ORDER — DEXTROSE 50 % IV SOLN: 25.0000 g | INTRAVENOUS | Status: DC

## 2021-12-29 MED ORDER — DEXTROSE 50 % IV SOLN: 25.0000 g | Freq: Once | INTRAVENOUS | Status: DC | PRN

## 2021-12-29 MED ORDER — INSULIN GLARGINE-YFGN 100 UNIT/ML ~~LOC~~ SOLN
26.0000 [IU] | Freq: Two times a day (BID) | SUBCUTANEOUS | Status: DC
Start: 2021-12-29 — End: 2022-01-01
  Administered 2021-12-29 – 2021-12-31 (×5): 26 [IU] via SUBCUTANEOUS
  Filled 2021-12-29 (×7): qty 0.26

## 2021-12-29 MED ORDER — INSULIN ASPART 100 UNIT/ML IJ SOLN
16.0000 [IU] | Freq: Three times a day (TID) | INTRAMUSCULAR | Status: DC
  Administered 2021-12-29 – 2021-12-31 (×7): 16 [IU] via SUBCUTANEOUS
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
## 2021-12-29 NOTE — Progress Notes (Addendum)
Date and time results received: 12/29/21  1703 ? ?Test: CBG ?Critical Value: 37 ? ?Name of Provider Notified: Dr. Damita Dunnings at [phone] ? ?Orders Received? Or Actions Taken?: Verbal to recheck CBG 15 minutes after juice. If symptoms continue, call rapid response.  ?

## 2021-12-30 LAB — GLUCOSE, CAPILLARY
Glucose-Capillary: 98 mg/dL (ref 70–99)
Glucose-Capillary: 153 mg/dL — ABNORMAL HIGH (ref 70–99)
Glucose-Capillary: 155 mg/dL — ABNORMAL HIGH (ref 70–99)
Glucose-Capillary: 132 mg/dL — ABNORMAL HIGH (ref 70–99)
Glucose-Capillary: 106 mg/dL — ABNORMAL HIGH (ref 70–99)

## 2021-12-30 NOTE — Progress Notes (Signed)
Inpatient Diabetes Program Recommendations ? ?Diabetes Treatment Program Recommendations ? ?ADA Standards of Care ?Diabetes in Pregnancy Target Glucose Ranges: ? ?Fasting: 70 - 95 mg/dL ?1 hr postprandial: Less than '140mg'$ /dL (from first bite of meal) ?2 hr postprandial: Less than 120 mg/dL (from first bite of meal)   ? ?Lab Results  ?Component Value Date  ? GLUCAP 153 (H) 12/30/2021  ? HGBA1C 7.4 (H) 09/15/2021  ? ? ?Review of Glycemic Control ? Latest Reference Range & Units 12/29/21 17:33 12/29/21 21:26 12/29/21 23:22 12/30/21 05:03  ?Glucose-Capillary 70 - 99 mg/dL 91 227 (H) 98 153 (H)  ?(H): Data is abnormally high ?Current orders for Inpatient glycemic control:  ?Semglee 26 units BID, Novolog 0-14 units TID and Novolog 16 units TID with meals ?  ?Inpatient Diabetes Program Recommendations:   ? ?Noted hypoglycemia yesterday of 37 mg/dL and interventions provided. Assuming meal coverage was given without meal consumption in addition to slight increase to basal? Meal coverage parameters were not placed within order. Noted changes this AM and am in agreement. Safety parameters for administration added.  ? ?Follow.  ? ?Thanks, ?Bronson Curb, MSN, RNC-OB ?Diabetes Coordinator ?[phone] (8a-5p) ? ? ? ? ?
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
## 2021-12-31 DIAGNOSIS — O42112 Preterm premature rupture of membranes, onset of labor more than 24 hours following rupture, second trimester: Secondary | ICD-10-CM | POA: Diagnosis not present

## 2021-12-31 LAB — GLUCOSE, CAPILLARY
Glucose-Capillary: 107 mg/dL — ABNORMAL HIGH (ref 70–99)
Glucose-Capillary: 117 mg/dL — ABNORMAL HIGH (ref 70–99)
Glucose-Capillary: 125 mg/dL — ABNORMAL HIGH (ref 70–99)
Glucose-Capillary: 144 mg/dL — ABNORMAL HIGH (ref 70–99)
Glucose-Capillary: 77 mg/dL (ref 70–99)

## 2021-12-31 LAB — TYPE AND SCREEN
ABO/RH(D): O POS
Antibody Screen: NEGATIVE

## 2021-12-31 MED ORDER — METFORMIN HCL 500 MG PO TABS: 1000.0000 mg | ORAL_TABLET | Freq: Every day | ORAL | Status: DC

## 2021-12-31 MED ORDER — METFORMIN HCL 500 MG PO TABS
1000.0000 mg | ORAL_TABLET | Freq: Two times a day (BID) | ORAL | Status: DC
  Administered 2021-12-31 – 2022-01-24 (×48): 1000 mg via ORAL
  Filled 2021-12-31 (×48): qty 2

## 2021-12-31 MED ORDER — HYDROCORTISONE 1 % EX CREA
TOPICAL_CREAM | Freq: Four times a day (QID) | CUTANEOUS | Status: DC | PRN
  Administered 2022-01-13: 1 via TOPICAL
  Filled 2021-12-31 (×3): qty 28
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
## 2022-01-01 DIAGNOSIS — O42112 Preterm premature rupture of membranes, onset of labor more than 24 hours following rupture, second trimester: Secondary | ICD-10-CM | POA: Diagnosis not present

## 2022-01-01 LAB — GLUCOSE, CAPILLARY
Glucose-Capillary: 59 mg/dL — ABNORMAL LOW (ref 70–99)
Glucose-Capillary: 110 mg/dL — ABNORMAL HIGH (ref 70–99)
Glucose-Capillary: 98 mg/dL (ref 70–99)
Glucose-Capillary: 88 mg/dL (ref 70–99)
Glucose-Capillary: 98 mg/dL (ref 70–99)
Glucose-Capillary: 115 mg/dL — ABNORMAL HIGH (ref 70–99)
Glucose-Capillary: 107 mg/dL — ABNORMAL HIGH (ref 70–99)

## 2022-01-01 MED ORDER — BENZOCAINE 10 % MT GEL
Freq: Four times a day (QID) | OROMUCOSAL | Status: DC | PRN
  Administered 2022-01-01: 1 via OROMUCOSAL
  Filled 2022-01-01: qty 9

## 2022-01-01 MED ORDER — INSULIN GLARGINE-YFGN 100 UNIT/ML ~~LOC~~ SOLN
24.0000 [IU] | Freq: Two times a day (BID) | SUBCUTANEOUS | Status: DC
  Administered 2022-01-01 – 2022-01-09 (×17): 24 [IU] via SUBCUTANEOUS
  Filled 2022-01-01 (×18): qty 0.24

## 2022-01-01 MED ORDER — INSULIN ASPART 100 UNIT/ML IJ SOLN
14.0000 [IU] | Freq: Three times a day (TID) | INTRAMUSCULAR | Status: DC
  Administered 2022-01-01 – 2022-01-07 (×20): 14 [IU] via SUBCUTANEOUS
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
## 2022-01-02 DIAGNOSIS — O42112 Preterm premature rupture of membranes, onset of labor more than 24 hours following rupture, second trimester: Secondary | ICD-10-CM | POA: Diagnosis not present

## 2022-01-02 LAB — GLUCOSE, CAPILLARY
Glucose-Capillary: 113 mg/dL — ABNORMAL HIGH (ref 70–99)
Glucose-Capillary: 135 mg/dL — ABNORMAL HIGH (ref 70–99)
Glucose-Capillary: 80 mg/dL (ref 70–99)
Glucose-Capillary: 88 mg/dL (ref 70–99)
Glucose-Capillary: 90 mg/dL (ref 70–99)
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
## 2022-01-03 ENCOUNTER — Inpatient Hospital Stay (HOSPITAL_COMMUNITY): Payer: Medicaid Other

## 2022-01-03 DIAGNOSIS — O42112 Preterm premature rupture of membranes, onset of labor more than 24 hours following rupture, second trimester: Secondary | ICD-10-CM | POA: Diagnosis not present

## 2022-01-03 LAB — GLUCOSE, CAPILLARY
Glucose-Capillary: 81 mg/dL (ref 70–99)
Glucose-Capillary: 89 mg/dL (ref 70–99)
Glucose-Capillary: 70 mg/dL (ref 70–99)
Glucose-Capillary: 90 mg/dL (ref 70–99)

## 2022-01-03 LAB — TYPE AND SCREEN
ABO/RH(D): O POS
Antibody Screen: NEGATIVE

## 2022-01-03 MED ORDER — CLINDAMYCIN HCL 300 MG PO CAPS: 300.0000 mg | ORAL_CAPSULE | Freq: Three times a day (TID) | ORAL | Status: DC

## 2022-01-03 MED ORDER — AMOXICILLIN-POT CLAVULANATE 875-125 MG PO TABS
1.0000 | ORAL_TABLET | Freq: Two times a day (BID) | ORAL | Status: AC
  Administered 2022-01-03 – 2022-01-09 (×14): 1 via ORAL
  Filled 2022-01-03 (×14): qty 1
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
## 2022-01-04 ENCOUNTER — Ambulatory Visit: Payer: Medicaid Other | Admitting: Cardiology

## 2022-01-04 DIAGNOSIS — O42112 Preterm premature rupture of membranes, onset of labor more than 24 hours following rupture, second trimester: Secondary | ICD-10-CM | POA: Diagnosis not present

## 2022-01-04 LAB — GLUCOSE, CAPILLARY
Glucose-Capillary: 104 mg/dL — ABNORMAL HIGH (ref 70–99)
Glucose-Capillary: 74 mg/dL (ref 70–99)
Glucose-Capillary: 74 mg/dL (ref 70–99)
Glucose-Capillary: 76 mg/dL (ref 70–99)
Glucose-Capillary: 76 mg/dL (ref 70–99)
Glucose-Capillary: 77 mg/dL (ref 70–99)
Glucose-Capillary: 90 mg/dL (ref 70–99)
Glucose-Capillary: 91 mg/dL (ref 70–99)

## 2022-01-04 MED ORDER — OXYCODONE HCL 5 MG PO TABS
5.0000 mg | ORAL_TABLET | Freq: Four times a day (QID) | ORAL | Status: DC | PRN
  Administered 2022-01-04 – 2022-01-18 (×21): 5 mg via ORAL
  Filled 2022-01-04 (×24): qty 1
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
## 2022-01-05 DIAGNOSIS — O42112 Preterm premature rupture of membranes, onset of labor more than 24 hours following rupture, second trimester: Secondary | ICD-10-CM | POA: Diagnosis not present

## 2022-01-05 LAB — GLUCOSE, CAPILLARY
Glucose-Capillary: 87 mg/dL (ref 70–99)
Glucose-Capillary: 54 mg/dL — ABNORMAL LOW (ref 70–99)
Glucose-Capillary: 85 mg/dL (ref 70–99)
Glucose-Capillary: 159 mg/dL — ABNORMAL HIGH (ref 70–99)
Glucose-Capillary: 131 mg/dL — ABNORMAL HIGH (ref 70–99)

## 2022-01-05 NOTE — Progress Notes (Signed)
Hypoglycemic Event ? ?CBG: 54 ? ?Treatment: Pt given a snack  ? ?Symptoms: None ? ?Follow-up CBG: Time:1320 CBG Result:85 ? ?Possible Reasons for Event: Unknown ? ?Comments/MD notified: Dr [name]  ? ? ? ?Joann Meyers ? ? ?

## 2022-01-05 NOTE — Progress Notes (Signed)
Patient ID: Joann Meyers, female   DOB: 06-23-1986, 36 y.o.   MRN: [id number] ?FACULTY PRACTICE ANTEPARTUM(COMPREHENSIVE) NOTE ? ?Joann Meyers is a 36 y.o. G2P0101 at 8w0dby LMP who is admitted for rupture of membranes.   ?Fetal presentation is breech. ?Length of Stay:  28  Days ? ?Subjective: ?No complaint ?Patient reports the fetal movement as active. ?Patient reports uterine contraction  activity as none. ?Patient reports  vaginal bleeding as none. ?Patient describes fluid per vagina as Clear. ? ?Vitals:  Blood pressure (!) 124/59, pulse 82, temperature 98.6 ?F (37 ?C), temperature source Oral, resp. rate 17, height '5\' 2"'$  (1.575 m), weight 93.9 kg, last menstrual period 06/30/2021, SpO2 100 %, not currently breastfeeding. ?Physical Examination: ? General appearance - alert, well appearing, and in no distress ?Heart - normal rate and regular rhythm ?Abdomen - soft, nontender, nondistended ?Fundal Height:  size equals dates ?Cervical Exam: Not evaluated.  ?Extremities: extremities normal, atraumatic, no cyanosis or edema and Homans sign is negative, no sign of DVT  ?Membranes:ruptured, clear fluid ? ?Fetal Monitoring:   ?Fetal Heart Rate A   ?Mode External  [removed] filed at 01/04/2022 2223  ?Baseline Rate (A) 135 bpm filed at 01/04/2022 2223  ?Variability 6-25 BPM filed at 01/04/2022 2223  ?Accelerations 10 x 10 filed at 01/04/2022 2223  ?Decelerations None filed at 01/04/2022 2223  ?Multiple birth? N filed at 12/30/2021 1320  ? ? ?Labs:  ?Results for orders placed or performed during the hospital encounter of 12/08/21 (from the past 24 hour(s))  ?Glucose, capillary  ? Collection Time: 01/04/22 12:47 PM  ?Result Value Ref Range  ? Glucose-Capillary 76 70 - 99 mg/dL  ?Glucose, capillary  ? Collection Time: 01/04/22  3:18 PM  ?Result Value Ref Range  ? Glucose-Capillary 74 70 - 99 mg/dL  ?Glucose, capillary  ? Collection Time: 01/04/22  5:54 PM  ?Result Value Ref Range  ? Glucose-Capillary 104 (H) 70 - 99  mg/dL  ?Glucose, capillary  ? Collection Time: 01/04/22 10:11 PM  ?Result Value Ref Range  ? Glucose-Capillary 90 70 - 99 mg/dL  ?Glucose, capillary  ? Collection Time: 01/04/22 11:50 PM  ?Result Value Ref Range  ? Glucose-Capillary 91 70 - 99 mg/dL  ?Glucose, capillary  ? Collection Time: 01/05/22  5:31 AM  ?Result Value Ref Range  ? Glucose-Capillary 87 70 - 99 mg/dL  ? ? ? ?Medications:  Scheduled ? amoxicillin-clavulanate  1 tablet Oral Q12H  ? aspirin EC  81 mg Oral Daily  ? docusate sodium  100 mg Oral BID  ? ferrous gluconate  324 mg Oral Q breakfast  ? insulin aspart  0-14 Units Subcutaneous TID PC  ? insulin aspart  14 Units Subcutaneous TID AC  ? insulin glargine-yfgn  24 Units Subcutaneous BID  ? labetalol  300 mg Oral BID  ? metFORMIN  1,000 mg Oral BID AC & HS  ? NIFEdipine  30 mg Oral Daily  ? pantoprazole  40 mg Oral Daily  ? prenatal vitamin w/FE, FA  1 tablet Oral Q1200  ? sodium chloride flush  3 mL Intravenous Q12H  ? ?I have reviewed the patient's current medications. ? ?ASSESSMENT: ?Patient Active Problem List  ? Diagnosis Date Noted  ? Malpresentation before onset of labor 12/20/2021  ? BMI 38.0-38.9,adult 11/15/2021  ? Abnormal genetic test during pregnancy 11/15/2021  ? Type 2 diabetes mellitus without complication, without long-term current use of insulin [place] 11/15/2021  ? Preterm premature rupture of membranes (PPROM) at [redacted] weeks  gestation, antepartum 11/14/2021  ? Placenta previa 11/09/2021  ? Red Chart Rounds Patient 10/20/2021  ? Proteinuria affecting pregnancy, antepartum 09/17/2021  ? Rubella non-immune status, antepartum 09/16/2021  ? Anemia affecting pregnancy in second trimester 09/16/2021  ? History of severe preeclampsia, prior pregnancy, currently pregnant 09/15/2021  ? Previous cesarean delivery, antepartum 09/15/2021  ? Migraine headache 08/04/2020  ? Benign gestational thrombocytopenia in second trimester ([place]) 07/15/2020  ? 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[place]) 03/24/2020  ? Chronic hypertension affecting pregnancy 03/24/2020  ? Obesity in pregnancy, antepartum 03/24/2020  ? Supervision of high risk pregnancy, antepartum 02/04/2020  ? Preexisting diabetes complicating pregnancy, antepartum 02/04/2020  ? Seizures ([place])   ? Non-compliance 07/22/2017  ? History of TIA (transient ischemic attack) and stroke 03/03/2017  ? ? ?PLAN: ?Continue current care for PPSROM, diabetes management ? ?Emeterio Reeve ?01/05/2022,10:38 AM ? ? ? ? ? ? ?

## 2022-01-06 DIAGNOSIS — Z3A27 27 weeks gestation of pregnancy: Secondary | ICD-10-CM

## 2022-01-06 LAB — GLUCOSE, CAPILLARY
Glucose-Capillary: 120 mg/dL — ABNORMAL HIGH (ref 70–99)
Glucose-Capillary: 124 mg/dL — ABNORMAL HIGH (ref 70–99)
Glucose-Capillary: 72 mg/dL (ref 70–99)
Glucose-Capillary: 85 mg/dL (ref 70–99)
Glucose-Capillary: 94 mg/dL (ref 70–99)

## 2022-01-06 LAB — TYPE AND SCREEN
ABO/RH(D): O POS
Antibody Screen: NEGATIVE

## 2022-01-06 NOTE — Progress Notes (Signed)
Patient ID: Joann Meyers, female   DOB: [birth year], 36 y.o.   MRN: [id number] ?FACULTY PRACTICE ANTEPARTUM(COMPREHENSIVE) NOTE ? ?Joann Meyers is a 36 y.o. G2P0101 at 1w1dby LMP who is admitted for rupture of membranes.   ?Fetal presentation is unsure. ?Length of Stay:  29  Days ? ?Subjective: ?Feels well ?Patient reports the fetal movement as active. ?Patient reports uterine contraction  activity as none. ?Patient reports  vaginal bleeding as none. ?Patient describes fluid per vagina as Clear. ? ?Vitals:  Blood pressure (!) 123/57, pulse 86, temperature 98.5 ?F (36.9 ?C), temperature source Oral, resp. rate 18, height '5\' 2"'$  (1.575 m), weight 93.9 kg, last menstrual period 06/30/2021, SpO2 98 %, not currently breastfeeding. ?Physical Examination: ? General appearance - alert, well appearing, and in no distress ?Heart - normal rate and regular rhythm ?Abdomen - soft, nontender, nondistended ?Fundal Height:  size equals dates ?Cervical Exam: Not evaluated..Marland Kitchen?Extremities: extremities normal, atraumatic, no cyanosis or edema and Homans sign is negative, no sign of DVT with DTRs 2+ bilaterally ? ? ?Fetal Monitoring:   ?Fetal Heart Rate A   ?Mode External filed at 01/05/2022 1217  ?Baseline Rate (A) 140 bpm filed at 01/05/2022 1217  ?Variability 6-25 BPM filed at 01/05/2022 1217  ?Accelerations 10 x 10 filed at 01/05/2022 1217  ?Decelerations None filed at 01/05/2022 1217  ? ? ?Labs:  ?Results for orders placed or performed during the hospital encounter of 12/08/21 (from the past 24 hour(s))  ?Glucose, capillary  ? Collection Time: 01/05/22 12:19 PM  ?Result Value Ref Range  ? Glucose-Capillary 54 (L) 70 - 99 mg/dL  ?Glucose, capillary  ? Collection Time: 01/05/22  1:25 PM  ?Result Value Ref Range  ? Glucose-Capillary 85 70 - 99 mg/dL  ?Glucose, capillary  ? Collection Time: 01/05/22  4:46 PM  ?Result Value Ref Range  ? Glucose-Capillary 159 (H) 70 - 99 mg/dL  ?Glucose, capillary  ? Collection Time: 01/05/22  9:29  PM  ?Result Value Ref Range  ? Glucose-Capillary 131 (H) 70 - 99 mg/dL  ?Glucose, capillary  ? Collection Time: 01/06/22 12:03 AM  ?Result Value Ref Range  ? Glucose-Capillary 124 (H) 70 - 99 mg/dL  ?Glucose, capillary  ? Collection Time: 01/06/22  5:13 AM  ?Result Value Ref Range  ? Glucose-Capillary 94 70 - 99 mg/dL  ? ? ? ?Medications:  Scheduled ? amoxicillin-clavulanate  1 tablet Oral Q12H  ? aspirin EC  81 mg Oral Daily  ? docusate sodium  100 mg Oral BID  ? ferrous gluconate  324 mg Oral Q breakfast  ? insulin aspart  0-14 Units Subcutaneous TID PC  ? insulin aspart  14 Units Subcutaneous TID AC  ? insulin glargine-yfgn  24 Units Subcutaneous BID  ? labetalol  300 mg Oral BID  ? metFORMIN  1,000 mg Oral BID AC & HS  ? NIFEdipine  30 mg Oral Daily  ? pantoprazole  40 mg Oral Daily  ? prenatal vitamin w/FE, FA  1 tablet Oral Q1200  ? sodium chloride flush  3 mL Intravenous Q12H  ? ?I have reviewed the patient's current medications. ? ?ASSESSMENT: ?Patient Active Problem List  ? Diagnosis Date Noted  ? Malpresentation before onset of labor 12/20/2021  ? BMI 38.0-38.9,adult 11/15/2021  ? Abnormal genetic test during pregnancy 11/15/2021  ? Type 2 diabetes mellitus without complication, without long-term current use of insulin ([place] 11/15/2021  ? Preterm premature rupture of membranes (PPROM) at [redacted] weeks gestation, antepartum 11/14/2021  ? Placenta  previa 11/09/2021  ? Red Chart Rounds Patient 10/20/2021  ? Proteinuria affecting pregnancy, antepartum 09/17/2021  ? Rubella non-immune status, antepartum 09/16/2021  ? Anemia affecting pregnancy in second trimester 09/16/2021  ? History of severe preeclampsia, prior pregnancy, currently pregnant 09/15/2021  ? Previous cesarean delivery, antepartum 09/15/2021  ? Migraine headache 08/04/2020  ? Benign gestational thrombocytopenia in second trimester ([employer]) 07/15/2020  ? 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 Chronic hypertension affecting pregnancy 03/24/2020  ? Obesity in pregnancy, antepartum 03/24/2020  ? Supervision of high risk pregnancy, antepartum 02/04/2020  ? Preexisting diabetes complicating pregnancy, antepartum 02/04/2020  ? Seizures ([place])   ? Non-compliance 07/22/2017  ? History of TIA (transient ischemic attack) and stroke 03/03/2017  ? ? ?PLAN: ?Continue present management  ? ?Emeterio Reeve ?01/06/2022,9:12 AM ? ? ? ? ? ? ?

## 2022-01-07 LAB — GLUCOSE, CAPILLARY
Glucose-Capillary: 134 mg/dL — ABNORMAL HIGH (ref 70–99)
Glucose-Capillary: 193 mg/dL — ABNORMAL HIGH (ref 70–99)
Glucose-Capillary: 58 mg/dL — ABNORMAL LOW (ref 70–99)
Glucose-Capillary: 59 mg/dL — ABNORMAL LOW (ref 70–99)
Glucose-Capillary: 63 mg/dL — ABNORMAL LOW (ref 70–99)
Glucose-Capillary: 63 mg/dL — ABNORMAL LOW (ref 70–99)
Glucose-Capillary: 71 mg/dL (ref 70–99)
Glucose-Capillary: 84 mg/dL (ref 70–99)
Glucose-Capillary: 90 mg/dL (ref 70–99)
Glucose-Capillary: 90 mg/dL (ref 70–99)
Glucose-Capillary: 95 mg/dL (ref 70–99)

## 2022-01-07 NOTE — Progress Notes (Signed)
Patient ID: Joann Meyers, female   DOB: September 21, 1986, 36 y.o.   MRN: [id number] ?FACULTY PRACTICE ANTEPARTUM(COMPREHENSIVE) NOTE ? ?Joann Meyers is a 36 y.o. G2P0101 at 27w2dby LMP who is admitted for rupture of membranes.   ?Fetal presentation is unsure. ?Length of Stay:  30  Days ? ?Subjective: ?Feels well ?Patient reports the fetal movement as active. ?Patient reports uterine contraction  activity as none. ?Patient reports  vaginal bleeding as none. ?Patient describes fluid per vagina as Clear. ? ?Vitals:  Blood pressure 114/61, pulse 77, temperature (!) 97.5 ?F (36.4 ?C), temperature source Oral, resp. rate 16, height '5\' 2"'$  (1.575 m), weight 93.9 kg, last menstrual period 06/30/2021, SpO2 99 %, not currently breastfeeding. ?Physical Examination: ? General appearance - alert, well appearing, and in no distress ?Heart - normal rate and regular rhythm ?Abdomen - soft, nontender, nondistended ?Fundal Height:  size equals dates ?Cervical Exam: Not evaluated..Marland Kitchen?Extremities: extremities normal, atraumatic, no cyanosis or edema and Homans sign is negative, no sign of DVT with DTRs 2+ bilaterally ? ? ?Fetal Monitoring:   ?FHT 150s, moderate variability, no decels, accels appropriate for GA ?[place]oco: flat ? ? ?Labs:  ?Results for orders placed or performed during the hospital encounter of 12/08/21 (from the past 24 hour(s))  ?Glucose, capillary  ? Collection Time: 01/06/22 10:43 AM  ?Result Value Ref Range  ? Glucose-Capillary 72 70 - 99 mg/dL  ?Glucose, capillary  ? Collection Time: 01/06/22  3:52 PM  ?Result Value Ref Range  ? Glucose-Capillary 120 (H) 70 - 99 mg/dL  ?Type and screen [place] ? Collection Time: 01/06/22  8:57 PM  ?Result Value Ref Range  ? ABO/RH(D) O POS   ? Antibody Screen NEG   ? Sample Expiration    ?  01/09/2022,2359 ?Performed at [facility] [id number] [place] [address]?  ?Glucose, capillary  ? Collection Time: 01/06/22  9:13 PM  ?Result Value Ref Range   ? Glucose-Capillary 85 70 - 99 mg/dL  ? Comment 1 Notify RN   ?Glucose, capillary  ? Collection Time: 01/07/22 12:05 AM  ?Result Value Ref Range  ? Glucose-Capillary 95 70 - 99 mg/dL  ? Comment 1 Notify RN   ?Glucose, capillary  ? Collection Time: 01/07/22  5:56 AM  ?Result Value Ref Range  ? Glucose-Capillary 84 70 - 99 mg/dL  ? Comment 1 Notify RN   ? ? ? ?Medications:  Scheduled ? amoxicillin-clavulanate  1 tablet Oral Q12H  ? aspirin EC  81 mg Oral Daily  ? docusate sodium  100 mg Oral BID  ? ferrous gluconate  324 mg Oral Q breakfast  ? insulin aspart  0-14 Units Subcutaneous TID PC  ? insulin aspart  14 Units Subcutaneous TID AC  ? insulin glargine-yfgn  24 Units Subcutaneous BID  ? labetalol  300 mg Oral BID  ? metFORMIN  1,000 mg Oral BID AC & HS  ? NIFEdipine  30 mg Oral Daily  ? pantoprazole  40 mg Oral Daily  ? prenatal vitamin w/FE, FA  1 tablet Oral Q1200  ? sodium chloride flush  3 mL Intravenous Q12H  ? ?I have reviewed the patient's current medications. ? ?ASSESSMENT: ?Patient Active Problem List  ? Diagnosis Date Noted  ? Malpresentation before onset of labor 12/20/2021  ? BMI 38.0-38.9,adult 11/15/2021  ? Abnormal genetic test during pregnancy 11/15/2021  ? Type 2 diabetes mellitus without complication, without long-term current use of insulin ([place] 11/15/2021  ?  Preterm premature rupture of membranes (PPROM) at [redacted] weeks gestation, antepartum 11/14/2021  ? Placenta previa 11/09/2021  ? Red Chart Rounds Patient 10/20/2021  ? Proteinuria affecting pregnancy, antepartum 09/17/2021  ? Rubella non-immune status, antepartum 09/16/2021  ? Anemia affecting pregnancy in second trimester 09/16/2021  ? History of severe preeclampsia, prior pregnancy, currently pregnant 09/15/2021  ? Previous cesarean delivery, antepartum 09/15/2021  ? Migraine headache 08/04/2020  ? Benign gestational thrombocytopenia in second trimester ([place]) 07/15/2020  ? 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 Chronic hypertension affecting pregnancy 03/24/2020  ? Obesity in pregnancy, antepartum 03/24/2020  ? Supervision of high risk pregnancy, antepartum 02/04/2020  ? Preexisting diabetes complicating pregnancy, antepartum 02/04/2020  ? Seizures ([place])   ? Non-compliance 07/22/2017  ? History of TIA (transient ischemic attack) and stroke 03/03/2017  ? ? ?PLAN: ?Continue present management  ?Growth [place] to be ordered on 3/30 ?Still remains candidate for repeat BMZ and Mag if delivery anticipated.  ?Needs RPR/HIV with CBC this week and tdap.  ? ?Radene Gunning ?01/07/2022,7:54 AM ? ? ? ? ? ? ?

## 2022-01-07 NOTE — Progress Notes (Signed)
Hypoglycemic Event ? ?CBG:  63 ? ?Treatment: 8 oz juice/soda ? ?Symptoms:  vomiting, pale ? ?Follow-up CBG: Time: CBG 1629Result:90 ? ?Possible Reasons for Event: Unknown ? ?Comments/MD notified:Dr. Elgie Congo ? ? ? ?Eusebio Friendly [facility] ? ? ?

## 2022-01-08 LAB — GLUCOSE, CAPILLARY
Glucose-Capillary: 127 mg/dL — ABNORMAL HIGH (ref 70–99)
Glucose-Capillary: 137 mg/dL — ABNORMAL HIGH (ref 70–99)
Glucose-Capillary: 75 mg/dL (ref 70–99)
Glucose-Capillary: 85 mg/dL (ref 70–99)
Glucose-Capillary: 85 mg/dL (ref 70–99)
Glucose-Capillary: 86 mg/dL (ref 70–99)

## 2022-01-08 MED ORDER — INSULIN ASPART 100 UNIT/ML IJ SOLN
10.0000 [IU] | Freq: Three times a day (TID) | INTRAMUSCULAR | Status: DC
  Administered 2022-01-08 – 2022-01-18 (×31): 10 [IU] via SUBCUTANEOUS

## 2022-01-08 NOTE — Progress Notes (Signed)
?Patient ID: Joann Meyers, female   DOB: 1986/02/24, 36 y.o.   MRN: [id number] ?FACULTY PRACTICE ANTEPARTUM(COMPREHENSIVE) NOTE ?  ?Joann Meyers is a 36 y.o. G2P0101 at 54w2dby LMP who is admitted for rupture of membranes.   ?Fetal presentation is unsure. ?Length of Stay:  30  Days ?  ?Subjective: ?Feels well ?Patient reports the fetal movement as active. ?Patient reports uterine contraction  activity as none. ?Patient reports  vaginal bleeding as none. ?Patient describes fluid per vagina as Clear. ?  ?Blood pressure 129/61, pulse 83, temperature 98 ?F (36.7 ?C), temperature source Oral, resp. rate 16, height '5\' 2"'$  (1.575 m), weight 93.9 kg, last menstrual period 06/30/2021, SpO2 99 %, not currently breastfeeding. ? ?Physical Examination: ? General appearance - alert, well appearing, and in no distress ?Heart - normal rate and regular rhythm ?Abdomen - soft, nontender, nondistended ?Fundal Height:  size equals dates ?Cervical Exam: Not evaluated..Marland Kitchen?Extremities: extremities normal, atraumatic, no cyanosis or edema and Homans sign is negative, no sign of DVT   ?  ?Fetal Monitoring:   ?Fetal Heart Rate A   ?Mode External filed at 01/07/2022 2224  ?Baseline Rate (A) 135 bpm filed at 01/07/2022 2224  ?Variability 6-25 BPM filed at 01/07/2022 2224  ?Accelerations 15 x 15 filed at 01/07/2022 2224  ?Decelerations None filed at 01/07/2022 2224  ? ? ?  ?  ?Labs:  ?      ?Results for orders placed or performed during the hospital encounter of 12/08/21 (from the past 24 hour(s))  ?Glucose, capillary  ?  Collection Time: 01/06/22 10:43 AM  ?Result Value Ref Range  ?  Glucose-Capillary 72 70 - 99 mg/dL  ?Glucose, capillary  ?  Collection Time: 01/06/22  3:52 PM  ?Result Value Ref Range  ?  Glucose-Capillary 120 (H) 70 - 99 mg/dL  ?Type and screen [place] ?  Collection Time: 01/06/22  8:57 PM  ?Result Value Ref Range  ?  ABO/RH(D) O POS    ?  Antibody Screen NEG    ?  Sample Expiration      ?     01/09/2022,2359 ?Performed at [facility] [address], [place] [address]?   ?CBG (last 3)  ?Recent Labs  ?  01/07/22 ?2103 01/08/22 ?0664403/26/23 ?0612  ?GLUCAP 193* 86 85  ? ? ?  ?  ?Medications:  Scheduled ? amoxicillin-clavulanate  1 tablet Oral Q12H  ? aspirin EC  81 mg Oral Daily  ? docusate sodium  100 mg Oral BID  ? ferrous gluconate  324 mg Oral Q breakfast  ? insulin aspart  0-14 Units Subcutaneous TID PC  ? insulin aspart  14 Units Subcutaneous TID AC  ? insulin glargine-yfgn  24 Units Subcutaneous BID  ? labetalol  300 mg Oral BID  ? metFORMIN  1,000 mg Oral BID AC & HS  ? NIFEdipine  30 mg Oral Daily  ? pantoprazole  40 mg Oral Daily  ? prenatal vitamin w/FE, FA  1 tablet Oral Q1200  ? sodium chloride flush  3 mL Intravenous Q12H  ?  ?I have reviewed the patient's current medications. ?  ?ASSESSMENT: ?    ?Patient Active Problem List  ?  Diagnosis Date Noted  ? Malpresentation before onset of labor 12/20/2021  ? BMI 38.0-38.9,adult 11/15/2021  ? Abnormal genetic test during pregnancy 11/15/2021  ? Type 2 diabetes mellitus without complication, without long-term current use of insulin ([place] 11/15/2021  ? Preterm  premature rupture of membranes (PPROM) at [redacted] weeks gestation, antepartum 11/14/2021  ? Placenta previa 11/09/2021  ? Red Chart Rounds Patient 10/20/2021  ? Proteinuria affecting pregnancy, antepartum 09/17/2021  ? Rubella non-immune status, antepartum 09/16/2021  ? Anemia affecting pregnancy in second trimester 09/16/2021  ? History of severe preeclampsia, prior pregnancy, currently pregnant 09/15/2021  ? Previous cesarean delivery, antepartum 09/15/2021  ? Migraine headache 08/04/2020  ? Benign gestational thrombocytopenia in second trimester ([place]) 07/15/2020  ? 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 Chronic hypertension affecting pregnancy 03/24/2020  ? Obesity in pregnancy, antepartum  03/24/2020  ? Supervision of high risk pregnancy, antepartum 02/04/2020  ? Preexisting diabetes complicating pregnancy, antepartum 02/04/2020  ? Seizures ([place])    ? Non-compliance 07/22/2017  ? History of TIA (transient ischemic attack) and stroke 03/03/2017  ?  ?  ?PLAN: ?Due to symptomatic hypoglycemia her Novolog at meals were decreased ?Growth [place] to be ordered on 3/30 ? ?Woodroe Mode, MD ?01/08/2022 ? ? ?

## 2022-01-09 DIAGNOSIS — O42112 Preterm premature rupture of membranes, onset of labor more than 24 hours following rupture, second trimester: Secondary | ICD-10-CM | POA: Diagnosis not present

## 2022-01-09 LAB — GLUCOSE, CAPILLARY
Glucose-Capillary: 101 mg/dL — ABNORMAL HIGH (ref 70–99)
Glucose-Capillary: 112 mg/dL — ABNORMAL HIGH (ref 70–99)
Glucose-Capillary: 124 mg/dL — ABNORMAL HIGH (ref 70–99)
Glucose-Capillary: 79 mg/dL (ref 70–99)
Glucose-Capillary: 83 mg/dL (ref 70–99)
Glucose-Capillary: 88 mg/dL (ref 70–99)
Glucose-Capillary: 95 mg/dL (ref 70–99)
Glucose-Capillary: 98 mg/dL (ref 70–99)

## 2022-01-09 LAB — TYPE AND SCREEN
ABO/RH(D): O POS
Antibody Screen: NEGATIVE

## 2022-01-09 MED ORDER — INSULIN GLARGINE-YFGN 100 UNIT/ML ~~LOC~~ SOLN
22.0000 [IU] | Freq: Two times a day (BID) | SUBCUTANEOUS | Status: DC
  Administered 2022-01-09 – 2022-01-20 (×23): 22 [IU] via SUBCUTANEOUS
  Filled 2022-01-09 (×25): qty 0.22
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
## 2022-01-10 DIAGNOSIS — O42112 Preterm premature rupture of membranes, onset of labor more than 24 hours following rupture, second trimester: Secondary | ICD-10-CM | POA: Diagnosis not present

## 2022-01-10 LAB — GLUCOSE, CAPILLARY
Glucose-Capillary: 88 mg/dL (ref 70–99)
Glucose-Capillary: 91 mg/dL (ref 70–99)
Glucose-Capillary: 123 mg/dL — ABNORMAL HIGH (ref 70–99)
Glucose-Capillary: 96 mg/dL (ref 70–99)

## 2022-01-10 MED ORDER — TETANUS-DIPHTH-ACELL PERTUSSIS 5-2.5-18.5 LF-MCG/0.5 IM SUSY
0.5000 mL | PREFILLED_SYRINGE | Freq: Once | INTRAMUSCULAR | Status: AC
  Administered 2022-01-10: 0.5 mL via INTRAMUSCULAR
  Filled 2022-01-10: qty 0.5
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
## 2022-01-10 NOTE — Progress Notes (Signed)
Fetal monitoring applied at 2236. Fetal heart rate dropped at 2250. RN fadre Dillard and myself responded to change and turned patient onto side. Provider constant notified at 2253. Fetal heart rate returned to baseline and provider arrived at approximately  2258. MD informed patient to stay on her left side and avoid her back for the night. Verbal order to continue monitoring for 20 minutes. No further orders. ?

## 2022-01-11 DIAGNOSIS — O42112 Preterm premature rupture of membranes, onset of labor more than 24 hours following rupture, second trimester: Secondary | ICD-10-CM | POA: Diagnosis not present

## 2022-01-11 LAB — GLUCOSE, CAPILLARY
Glucose-Capillary: 142 mg/dL — ABNORMAL HIGH (ref 70–99)
Glucose-Capillary: 57 mg/dL — ABNORMAL LOW (ref 70–99)
Glucose-Capillary: 70 mg/dL (ref 70–99)
Glucose-Capillary: 82 mg/dL (ref 70–99)
Glucose-Capillary: 83 mg/dL (ref 70–99)

## 2022-01-11 LAB — CBC
HCT: 23.8 % — ABNORMAL LOW (ref 36.0–46.0)
Hemoglobin: 7.9 g/dL — ABNORMAL LOW (ref 12.0–15.0)
MCH: 27.8 pg (ref 26.0–34.0)
MCHC: 33.2 g/dL (ref 30.0–36.0)
MCV: 83.8 fL (ref 80.0–100.0)
Platelets: 145 10*3/uL — ABNORMAL LOW (ref 150–400)
RBC: 2.84 MIL/uL — ABNORMAL LOW (ref 3.87–5.11)
RDW: 17.8 % — ABNORMAL HIGH (ref 11.5–15.5)
WBC: 6.5 10*3/uL (ref 4.0–10.5)
nRBC: 0.3 % — ABNORMAL HIGH (ref 0.0–0.2)

## 2022-01-11 LAB — HIV ANTIBODY (ROUTINE TESTING W REFLEX): HIV Screen 4th Generation wRfx: NONREACTIVE

## 2022-01-11 LAB — RPR: RPR Ser Ql: NONREACTIVE
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
## 2022-01-12 ENCOUNTER — Inpatient Hospital Stay (HOSPITAL_BASED_OUTPATIENT_CLINIC_OR_DEPARTMENT_OTHER): Payer: Medicaid Other

## 2022-01-12 DIAGNOSIS — O42112 Preterm premature rupture of membranes, onset of labor more than 24 hours following rupture, second trimester: Secondary | ICD-10-CM | POA: Diagnosis not present

## 2022-01-12 DIAGNOSIS — O09213 Supervision of pregnancy with history of pre-term labor, third trimester: Secondary | ICD-10-CM | POA: Diagnosis not present

## 2022-01-12 DIAGNOSIS — O42913 Preterm premature rupture of membranes, unspecified as to length of time between rupture and onset of labor, third trimester: Secondary | ICD-10-CM

## 2022-01-12 DIAGNOSIS — O281 Abnormal biochemical finding on antenatal screening of mother: Secondary | ICD-10-CM

## 2022-01-12 DIAGNOSIS — Z3A28 28 weeks gestation of pregnancy: Secondary | ICD-10-CM

## 2022-01-12 DIAGNOSIS — O365930 Maternal care for other known or suspected poor fetal growth, third trimester, not applicable or unspecified: Secondary | ICD-10-CM

## 2022-01-12 DIAGNOSIS — O09523 Supervision of elderly multigravida, third trimester: Secondary | ICD-10-CM

## 2022-01-12 DIAGNOSIS — O10013 Pre-existing essential hypertension complicating pregnancy, third trimester: Secondary | ICD-10-CM

## 2022-01-12 LAB — GLUCOSE, CAPILLARY
Glucose-Capillary: 77 mg/dL (ref 70–99)
Glucose-Capillary: 84 mg/dL (ref 70–99)
Glucose-Capillary: 91 mg/dL (ref 70–99)
Glucose-Capillary: 89 mg/dL (ref 70–99)
Glucose-Capillary: 110 mg/dL — ABNORMAL HIGH (ref 70–99)

## 2022-01-12 LAB — TYPE AND SCREEN
ABO/RH(D): O POS
Antibody Screen: NEGATIVE

## 2022-01-12 MED ORDER — VITAMINS A & D EX OINT
TOPICAL_OINTMENT | CUTANEOUS | Status: DC | PRN
  Filled 2022-01-12: qty 56.7

## 2022-01-12 MED ORDER — VITAMINS A & D EX OINT
TOPICAL_OINTMENT | CUTANEOUS | Status: DC | PRN
  Administered 2022-01-12 (×2): 113 via TOPICAL
  Filled 2022-01-12: qty 56.7

## 2022-01-12 NOTE — Progress Notes (Signed)
Inpatient Diabetes Program Recommendations ? ?AACE/ADA: New Consensus Statement on Inpatient Glycemic Control (2015) ? ?Target Ranges:  Prepandial:   less than 140 mg/dL ?     Peak postprandial:   less than 180 mg/dL (1-2 hours) ?     Critically ill patients:  140 - 180 mg/dL  ? ?Lab Results  ?Component Value Date  ? GLUCAP 84 01/12/2022  ? HGBA1C 7.4 (H) 09/15/2021  ? ? ?Review of Glycemic Control ? Latest Reference Range & Units 01/11/22 11:02 01/11/22 14:57 01/11/22 15:18 01/11/22 21:34 01/11/22 23:51  ?Glucose-Capillary 70 - 99 mg/dL 70 57 (L) 83 142 (H) 82  ? ?Noted hypoglycemia yesterday of 57 mg/dL following Novolog 10 units TID. However, patient was administered meal coverage at the start of the meal and with a previous CBG of 70 mg/dL. Appears that meal coverage should have been held. In agreement with current orders.  ? ?Thanks, ?Bronson Curb, MSN, RNC-OB ?Diabetes Coordinator ?[phone] (8a-5p) ? ? ?
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
## 2022-01-12 NOTE — Progress Notes (Signed)
Maternal-Fetal Medicine (Telephone Note) ? ?Patient is admitted with diagnosis of PPROM. ? ?She has chronic hypertension and pregestational diabetes. ? ?Ultrasound ?On today's ultrasound, the estimated fetal weight is at the 4th percentile.  Femur length measurement is at the 2nd percentile and the abdominal circumference measurement is at the 13th percentile.  Oligohydramnios is seen.  Umbilical artery Doppler showed normal forward diastolic flow.  Breech presentation.  On transabdominal ultrasound, the cervix could not be assessed and we were unable to rule out umbilical cord overlying the cervix. ?I spoke with the patient on the phone and discussed ultrasound findings consistent with fetal growth restriction. ?I explained the possible causes including placental insufficiency (most common cause), fetal chromosomal anomalies or genetic syndromes. ? ?I explained ultrasound protocol of weekly Doppler studies biophysical profile starting at [redacted] weeks gestation. ? ?Timing of delivery will be based on antenatal testing and interval fetal growth. ? ?Recommendations ?-Weekly UA Doppler to continue. ?-Weekly BPP starting at 30-weeks' gestation. ?

## 2022-01-13 DIAGNOSIS — O42112 Preterm premature rupture of membranes, onset of labor more than 24 hours following rupture, second trimester: Secondary | ICD-10-CM | POA: Diagnosis not present

## 2022-01-13 LAB — CBC
HCT: 23.2 % — ABNORMAL LOW (ref 36.0–46.0)
Hemoglobin: 8 g/dL — ABNORMAL LOW (ref 12.0–15.0)
MCH: 28.8 pg (ref 26.0–34.0)
MCHC: 34.5 g/dL (ref 30.0–36.0)
MCV: 83.5 fL (ref 80.0–100.0)
Platelets: 135 10*3/uL — ABNORMAL LOW (ref 150–400)
RBC: 2.78 MIL/uL — ABNORMAL LOW (ref 3.87–5.11)
RDW: 17.8 % — ABNORMAL HIGH (ref 11.5–15.5)
WBC: 6.9 10*3/uL (ref 4.0–10.5)
nRBC: 0 % (ref 0.0–0.2)

## 2022-01-13 LAB — COMPREHENSIVE METABOLIC PANEL
ALT: 57 U/L — ABNORMAL HIGH (ref 0–44)
AST: 32 U/L (ref 15–41)
Albumin: 2.7 g/dL — ABNORMAL LOW (ref 3.5–5.0)
Alkaline Phosphatase: 53 U/L (ref 38–126)
Anion gap: 8 (ref 5–15)
BUN: 16 mg/dL (ref 6–20)
CO2: 21 mmol/L — ABNORMAL LOW (ref 22–32)
Calcium: 9.3 mg/dL (ref 8.9–10.3)
Chloride: 106 mmol/L (ref 98–111)
Creatinine, Ser: 0.52 mg/dL (ref 0.44–1.00)
GFR, Estimated: >60 mL/min (ref >60)
Glucose, Bld: 78 mg/dL (ref 70–99)
Potassium: 3.5 mmol/L (ref 3.5–5.1)
Sodium: 135 mmol/L (ref 135–145)
Total Bilirubin: 0.5 mg/dL (ref 0.3–1.2)
Total Protein: 5.6 g/dL — ABNORMAL LOW (ref 6.5–8.1)

## 2022-01-13 LAB — GLUCOSE, CAPILLARY
Glucose-Capillary: 113 mg/dL — ABNORMAL HIGH (ref 70–99)
Glucose-Capillary: 139 mg/dL — ABNORMAL HIGH (ref 70–99)
Glucose-Capillary: 83 mg/dL (ref 70–99)
Glucose-Capillary: 93 mg/dL (ref 70–99)
Glucose-Capillary: 99 mg/dL (ref 70–99)
Glucose-Capillary: 99 mg/dL (ref 70–99)
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
## 2022-01-14 DIAGNOSIS — O4400 Placenta previa specified as without hemorrhage, unspecified trimester: Secondary | ICD-10-CM | POA: Diagnosis not present

## 2022-01-14 LAB — COMPREHENSIVE METABOLIC PANEL
ALT: 50 U/L — ABNORMAL HIGH (ref 0–44)
AST: 30 U/L (ref 15–41)
Albumin: 2.7 g/dL — ABNORMAL LOW (ref 3.5–5.0)
Alkaline Phosphatase: 56 U/L (ref 38–126)
Anion gap: 8 (ref 5–15)
BUN: 15 mg/dL (ref 6–20)
CO2: 22 mmol/L (ref 22–32)
Calcium: 9.1 mg/dL (ref 8.9–10.3)
Chloride: 108 mmol/L (ref 98–111)
Creatinine, Ser: 0.55 mg/dL (ref 0.44–1.00)
GFR, Estimated: >60 mL/min (ref >60)
Glucose, Bld: 126 mg/dL — ABNORMAL HIGH (ref 70–99)
Potassium: 3.5 mmol/L (ref 3.5–5.1)
Sodium: 138 mmol/L (ref 135–145)
Total Bilirubin: 0.5 mg/dL (ref 0.3–1.2)
Total Protein: 5.5 g/dL — ABNORMAL LOW (ref 6.5–8.1)

## 2022-01-14 LAB — PROTEIN / CREATININE RATIO, URINE
Creatinine, Urine: 82.45 mg/dL
Protein Creatinine Ratio: 0.39 mg/mg{Cre} — ABNORMAL HIGH (ref 0.00–0.15)
Total Protein, Urine: 32 mg/dL

## 2022-01-14 LAB — CBC
HCT: 24.3 % — ABNORMAL LOW (ref 36.0–46.0)
Hemoglobin: 7.8 g/dL — ABNORMAL LOW (ref 12.0–15.0)
MCH: 27 pg (ref 26.0–34.0)
MCHC: 32.1 g/dL (ref 30.0–36.0)
MCV: 84.1 fL (ref 80.0–100.0)
Platelets: 147 10*3/uL — ABNORMAL LOW (ref 150–400)
RBC: 2.89 MIL/uL — ABNORMAL LOW (ref 3.87–5.11)
RDW: 17.7 % — ABNORMAL HIGH (ref 11.5–15.5)
WBC: 6.4 10*3/uL (ref 4.0–10.5)
nRBC: 0 % (ref 0.0–0.2)

## 2022-01-14 LAB — GLUCOSE, CAPILLARY
Glucose-Capillary: 131 mg/dL — ABNORMAL HIGH (ref 70–99)
Glucose-Capillary: 104 mg/dL — ABNORMAL HIGH (ref 70–99)
Glucose-Capillary: 135 mg/dL — ABNORMAL HIGH (ref 70–99)
Glucose-Capillary: 208 mg/dL — ABNORMAL HIGH (ref 70–99)
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
## 2022-01-15 DIAGNOSIS — O4400 Placenta previa specified as without hemorrhage, unspecified trimester: Secondary | ICD-10-CM | POA: Diagnosis not present

## 2022-01-15 LAB — GLUCOSE, CAPILLARY
Glucose-Capillary: 100 mg/dL — ABNORMAL HIGH (ref 70–99)
Glucose-Capillary: 113 mg/dL — ABNORMAL HIGH (ref 70–99)
Glucose-Capillary: 119 mg/dL — ABNORMAL HIGH (ref 70–99)
Glucose-Capillary: 124 mg/dL — ABNORMAL HIGH (ref 70–99)
Glucose-Capillary: 89 mg/dL (ref 70–99)
Glucose-Capillary: 96 mg/dL (ref 70–99)

## 2022-01-15 LAB — TYPE AND SCREEN
ABO/RH(D): O POS
Antibody Screen: NEGATIVE
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
## 2022-01-16 DIAGNOSIS — K5900 Constipation, unspecified: Secondary | ICD-10-CM | POA: Diagnosis present

## 2022-01-16 DIAGNOSIS — O365990 Maternal care for other known or suspected poor fetal growth, unspecified trimester, not applicable or unspecified: Secondary | ICD-10-CM | POA: Diagnosis not present

## 2022-01-16 LAB — GLUCOSE, CAPILLARY
Glucose-Capillary: 97 mg/dL (ref 70–99)
Glucose-Capillary: 77 mg/dL (ref 70–99)
Glucose-Capillary: 107 mg/dL — ABNORMAL HIGH (ref 70–99)
Glucose-Capillary: 180 mg/dL — ABNORMAL HIGH (ref 70–99)

## 2022-01-16 MED ORDER — BISACODYL 10 MG RE SUPP
10.0000 mg | Freq: Every day | RECTAL | Status: DC | PRN
  Filled 2022-01-16: qty 1

## 2022-01-16 MED ORDER — VALACYCLOVIR HCL 500 MG PO TABS
1000.0000 mg | ORAL_TABLET | Freq: Two times a day (BID) | ORAL | Status: DC
  Administered 2022-01-16 – 2022-01-22 (×13): 1000 mg via ORAL
  Filled 2022-01-16 (×13): qty 2

## 2022-01-16 MED ORDER — SIMETHICONE 80 MG PO CHEW
80.0000 mg | CHEWABLE_TABLET | Freq: Two times a day (BID) | ORAL | Status: DC | PRN
  Administered 2022-01-16: 80 mg via ORAL
  Filled 2022-01-16: qty 1
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
## 2022-01-17 LAB — COMPREHENSIVE METABOLIC PANEL
ALT: 43 U/L (ref 0–44)
AST: 30 U/L (ref 15–41)
Albumin: 3.1 g/dL — ABNORMAL LOW (ref 3.5–5.0)
Alkaline Phosphatase: 59 U/L (ref 38–126)
Anion gap: 10 (ref 5–15)
BUN: 12 mg/dL (ref 6–20)
CO2: 20 mmol/L — ABNORMAL LOW (ref 22–32)
Calcium: 9.1 mg/dL (ref 8.9–10.3)
Chloride: 109 mmol/L (ref 98–111)
Creatinine, Ser: 0.51 mg/dL (ref 0.44–1.00)
GFR, Estimated: >60 mL/min (ref >60)
Glucose, Bld: 117 mg/dL — ABNORMAL HIGH (ref 70–99)
Potassium: 4 mmol/L (ref 3.5–5.1)
Sodium: 139 mmol/L (ref 135–145)
Total Bilirubin: 0.6 mg/dL (ref 0.3–1.2)
Total Protein: 6.1 g/dL — ABNORMAL LOW (ref 6.5–8.1)

## 2022-01-17 LAB — GLUCOSE, CAPILLARY
Glucose-Capillary: 109 mg/dL — ABNORMAL HIGH (ref 70–99)
Glucose-Capillary: 112 mg/dL — ABNORMAL HIGH (ref 70–99)
Glucose-Capillary: 135 mg/dL — ABNORMAL HIGH (ref 70–99)
Glucose-Capillary: 138 mg/dL — ABNORMAL HIGH (ref 70–99)
Glucose-Capillary: 139 mg/dL — ABNORMAL HIGH (ref 70–99)
Glucose-Capillary: 148 mg/dL — ABNORMAL HIGH (ref 70–99)
Glucose-Capillary: 175 mg/dL — ABNORMAL HIGH (ref 70–99)

## 2022-01-17 LAB — CBC
HCT: 26.7 % — ABNORMAL LOW (ref 36.0–46.0)
Hemoglobin: 8.5 g/dL — ABNORMAL LOW (ref 12.0–15.0)
MCH: 27.3 pg (ref 26.0–34.0)
MCHC: 31.8 g/dL (ref 30.0–36.0)
MCV: 85.9 fL (ref 80.0–100.0)
Platelets: 172 10*3/uL (ref 150–400)
RBC: 3.11 MIL/uL — ABNORMAL LOW (ref 3.87–5.11)
RDW: 18 % — ABNORMAL HIGH (ref 11.5–15.5)
WBC: 7 10*3/uL (ref 4.0–10.5)
nRBC: 0 % (ref 0.0–0.2)

## 2022-01-17 MED ORDER — SODIUM CHLORIDE 0.9 % IV SOLN
400.0000 mg | INTRAVENOUS | Status: DC
  Administered 2022-01-17: 400 mg via INTRAVENOUS
  Filled 2022-01-17: qty 20

## 2022-01-17 MED ORDER — LABETALOL HCL 200 MG PO TABS
300.0000 mg | ORAL_TABLET | Freq: Three times a day (TID) | ORAL | Status: DC
  Administered 2022-01-17 – 2022-01-24 (×19): 300 mg via ORAL
  Filled 2022-01-17 (×19): qty 1

## 2022-01-17 MED ORDER — FERROUS GLUCONATE 324 (38 FE) MG PO TABS
324.0000 mg | ORAL_TABLET | ORAL | Status: DC
  Administered 2022-01-19 – 2022-01-21 (×2): 324 mg via ORAL
  Filled 2022-01-17 (×2): qty 1
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
## 2022-01-18 LAB — TYPE AND SCREEN
ABO/RH(D): O POS
Antibody Screen: NEGATIVE

## 2022-01-18 LAB — GLUCOSE, CAPILLARY
Glucose-Capillary: 101 mg/dL — ABNORMAL HIGH (ref 70–99)
Glucose-Capillary: 118 mg/dL — ABNORMAL HIGH (ref 70–99)
Glucose-Capillary: 125 mg/dL — ABNORMAL HIGH (ref 70–99)
Glucose-Capillary: 92 mg/dL (ref 70–99)
Glucose-Capillary: 95 mg/dL (ref 70–99)

## 2022-01-18 MED ORDER — INSULIN ASPART 100 UNIT/ML IJ SOLN
12.0000 [IU] | Freq: Every day | INTRAMUSCULAR | Status: DC
  Administered 2022-01-18 – 2022-01-22 (×5): 12 [IU] via SUBCUTANEOUS

## 2022-01-18 MED ORDER — INSULIN ASPART 100 UNIT/ML IJ SOLN
10.0000 [IU] | Freq: Every day | INTRAMUSCULAR | Status: DC
  Administered 2022-01-19 – 2022-01-20 (×2): 10 [IU] via SUBCUTANEOUS

## 2022-01-18 NOTE — Progress Notes (Signed)
Inpatient Diabetes Program Recommendations ? ?Diabetes Treatment Program Recommendations ? ?ADA Standards of Care ?Diabetes in Pregnancy Target Glucose Ranges: ? ?Fasting: 70 - 95 mg/dL ?1 hr postprandial: Less than '140mg'$ /dL (from first bite of meal) ?2 hr postprandial: Less than 120 mg/dL (from first bite of meal)   ? ?Lab Results  ?Component Value Date  ? GLUCAP 118 (H) 01/18/2022  ? HGBA1C 7.4 (H) 09/15/2021  ? ? ?Review of Glycemic Control ? Latest Reference Range & Units 01/17/22 16:02 01/17/22 20:59 01/17/22 23:59 01/18/22 05:34  ?Glucose-Capillary 70 - 99 mg/dL 139 (H) 148 (H) 135 (H) 118 (H)  ? ?Current orders for Inpatient glycemic control: Novolog 10 units TID, Semglee 22 units BID, Metformin 1000 mg BID, Novolog 0-14 units TID ? ?Inpatient Diabetes Program Recommendations:   ? ?Could consider increasing meal coverage with lunch and dinner to Novolog 12 units.  ? ?Thanks, ?Bronson Curb, MSN, RNC-OB ?Diabetes Coordinator ?[phone] (8a-5p) ? ? ? ? ?

## 2022-01-18 NOTE — Progress Notes (Signed)
Patient ID: Joann Meyers, female   DOB: 1986/07/27, 36 y.o.   MRN: [id number] ?FACULTY PRACTICE ANTEPARTUM(COMPREHENSIVE) NOTE ? ?Joann Meyers is a 36 y.o. G2P0101 at 8w6dby LMP who is admitted for rupture of membranes.   ?Fetal presentation is breech. ?Length of Stay:  41  Days ? ?Subjective: ?No complaints ?Patient reports the fetal movement as active. ?Patient reports uterine contraction  activity as none. ?Patient reports  vaginal bleeding as none. ?Patient describes fluid per vagina as Clear. ? ?Vitals:  Blood pressure (!) 126/59, pulse 84, temperature 97.6 ?F (36.4 ?C), temperature source Oral, resp. rate 18, height '5\' 2"'$  (1.575 m), weight 93.9 kg, last menstrual period 06/30/2021, SpO2 97 %, not currently breastfeeding. ?Physical Examination: ? General appearance - alert, well appearing, and in no distress ?Heart - normal rate and regular rhythm ?Abdomen - soft, nontender, nondistended ?Fundal Height:  size equals dates ?Cervical Exam: Not evaluated..Marland Kitchen?Extremities: extremities normal, atraumatic, no cyanosis or edema and Homans sign is negative, no sign of DVT  ?Membranes:ruptured ? ?Fetal Monitoring:   ?Fetal Heart Rate A   ?Mode External filed at 01/18/2022 0207  ?Baseline Rate (A) 135 bpm filed at 01/18/2022 0207  ?Variability 6-25 BPM filed at 01/18/2022 0207  ?Accelerations 10 x 10, 15 x 15 filed at 01/18/2022 0207  ?Decelerations None filed at 01/18/2022 0207  ? ? ?Labs:  ?Results for orders placed or performed during the hospital encounter of 12/08/21 (from the past 24 hour(s))  ?Comprehensive metabolic panel  ? Collection Time: 01/17/22 10:06 AM  ?Result Value Ref Range  ? Sodium 139 135 - 145 mmol/L  ? Potassium 4.0 3.5 - 5.1 mmol/L  ? Chloride 109 98 - 111 mmol/L  ? CO2 20 (L) 22 - 32 mmol/L  ? Glucose, Bld 117 (H) 70 - 99 mg/dL  ? BUN 12 6 - 20 mg/dL  ? Creatinine, Ser 0.51 0.44 - 1.00 mg/dL  ? Calcium 9.1 8.9 - 10.3 mg/dL  ? Total Protein 6.1 (L) 6.5 - 8.1 g/dL  ? Albumin 3.1 (L) 3.5 - 5.0  g/dL  ? AST 30 15 - 41 U/L  ? ALT 43 0 - 44 U/L  ? Alkaline Phosphatase 59 38 - 126 U/L  ? Total Bilirubin 0.6 0.3 - 1.2 mg/dL  ? GFR, Estimated >60 >60 mL/min  ? Anion gap 10 5 - 15  ?CBC  ? Collection Time: 01/17/22 10:06 AM  ?Result Value Ref Range  ? WBC 7.0 4.0 - 10.5 K/uL  ? RBC 3.11 (L) 3.87 - 5.11 MIL/uL  ? Hemoglobin 8.5 (L) 12.0 - 15.0 g/dL  ? HCT 26.7 (L) 36.0 - 46.0 %  ? MCV 85.9 80.0 - 100.0 fL  ? MCH 27.3 26.0 - 34.0 pg  ? MCHC 31.8 30.0 - 36.0 g/dL  ? RDW 18.0 (H) 11.5 - 15.5 %  ? Platelets 172 150 - 400 K/uL  ? nRBC 0.0 0.0 - 0.2 %  ?Glucose, capillary  ? Collection Time: 01/17/22 11:34 AM  ?Result Value Ref Range  ? Glucose-Capillary 109 (H) 70 - 99 mg/dL  ?Glucose, capillary  ? Collection Time: 01/17/22  4:02 PM  ?Result Value Ref Range  ? Glucose-Capillary 139 (H) 70 - 99 mg/dL  ?Glucose, capillary  ? Collection Time: 01/17/22  8:59 PM  ?Result Value Ref Range  ? Glucose-Capillary 148 (H) 70 - 99 mg/dL  ?Glucose, capillary  ? Collection Time: 01/17/22 11:59 PM  ?Result Value Ref Range  ? Glucose-Capillary 135 (H) 70 - 99  mg/dL  ?Glucose, capillary  ? Collection Time: 01/18/22  5:34 AM  ?Result Value Ref Range  ? Glucose-Capillary 118 (H) 70 - 99 mg/dL  ? ? ? ?Medications:  Scheduled ? aspirin EC  81 mg Oral Daily  ? docusate sodium  100 mg Oral BID  ? [START ON 01/19/2022] ferrous gluconate  324 mg Oral QODAY  ? insulin aspart  0-14 Units Subcutaneous TID PC  ? insulin aspart  10 Units Subcutaneous TID AC  ? insulin glargine-yfgn  22 Units Subcutaneous BID  ? labetalol  300 mg Oral TID  ? metFORMIN  1,000 mg Oral BID AC & HS  ? NIFEdipine  30 mg Oral Daily  ? pantoprazole  40 mg Oral Daily  ? prenatal vitamin w/FE, FA  1 tablet Oral Q1200  ? sodium chloride flush  3 mL Intravenous Q12H  ? valACYclovir  1,000 mg Oral BID  ? ?I have reviewed the patient's current medications. ? ?ASSESSMENT: ?Patient Active Problem List  ? Diagnosis Date Noted  ? Fetal growth restriction antepartum 01/16/2022  ?  Constipation 01/16/2022  ? Malpresentation before onset of labor 12/20/2021  ? BMI 38.0-38.9,adult 11/15/2021  ? Abnormal genetic test during pregnancy 11/15/2021  ? Type 2 diabetes mellitus without complication, without long-term current use of insulin ([name]) 11/15/2021  ? Preterm premature rupture of membranes (PPROM) at [redacted] weeks gestation, antepartum 11/14/2021  ? Placenta previa 11/09/2021  ? Red Chart Rounds Patient 10/20/2021  ? Proteinuria affecting pregnancy, antepartum 09/17/2021  ? Rubella non-immune status, antepartum 09/16/2021  ? Anemia affecting pregnancy in second trimester 09/16/2021  ? History of severe preeclampsia, prior pregnancy, currently pregnant 09/15/2021  ? Previous cesarean delivery, antepartum 09/15/2021  ? Migraine headache 08/04/2020  ? Benign gestational thrombocytopenia in second trimester ([place]) 07/15/2020  ? 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[place]) 03/24/2020  ? Chronic hypertension affecting pregnancy 03/24/2020  ? Obesity in pregnancy, antepartum 03/24/2020  ? Supervision of high risk pregnancy, antepartum 02/04/2020  ? Preexisting diabetes complicating pregnancy, antepartum 02/04/2020  ? Seizures ([place])   ? Non-compliance 07/22/2017  ? History of TIA (transient ischemic attack) and stroke 03/03/2017  ? ? ?PLAN: ?Continue present management  ? ?Emeterio Reeve ?01/18/2022,7:27 AM ? ? ? ? ? ? ?

## 2022-01-19 LAB — GLUCOSE, CAPILLARY
Glucose-Capillary: 110 mg/dL — ABNORMAL HIGH (ref 70–99)
Glucose-Capillary: 164 mg/dL — ABNORMAL HIGH (ref 70–99)
Glucose-Capillary: 130 mg/dL — ABNORMAL HIGH (ref 70–99)
Glucose-Capillary: 121 mg/dL — ABNORMAL HIGH (ref 70–99)
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
## 2022-01-20 LAB — COMPREHENSIVE METABOLIC PANEL
ALT: 30 U/L (ref 0–44)
AST: 26 U/L (ref 15–41)
Albumin: 2.7 g/dL — ABNORMAL LOW (ref 3.5–5.0)
Alkaline Phosphatase: 53 U/L (ref 38–126)
Anion gap: 8 (ref 5–15)
BUN: 13 mg/dL (ref 6–20)
CO2: 20 mmol/L — ABNORMAL LOW (ref 22–32)
Calcium: 9.2 mg/dL (ref 8.9–10.3)
Chloride: 110 mmol/L (ref 98–111)
Creatinine, Ser: 0.55 mg/dL (ref 0.44–1.00)
GFR, Estimated: >60 mL/min (ref >60)
Glucose, Bld: 135 mg/dL — ABNORMAL HIGH (ref 70–99)
Potassium: 3.8 mmol/L (ref 3.5–5.1)
Sodium: 138 mmol/L (ref 135–145)
Total Bilirubin: 0.6 mg/dL (ref 0.3–1.2)
Total Protein: 5.6 g/dL — ABNORMAL LOW (ref 6.5–8.1)

## 2022-01-20 LAB — GLUCOSE, CAPILLARY
Glucose-Capillary: 120 mg/dL — ABNORMAL HIGH (ref 70–99)
Glucose-Capillary: 149 mg/dL — ABNORMAL HIGH (ref 70–99)
Glucose-Capillary: 221 mg/dL — ABNORMAL HIGH (ref 70–99)
Glucose-Capillary: 76 mg/dL (ref 70–99)
Glucose-Capillary: 92 mg/dL (ref 70–99)
Glucose-Capillary: 97 mg/dL (ref 70–99)

## 2022-01-20 LAB — CBC
HCT: 24.9 % — ABNORMAL LOW (ref 36.0–46.0)
Hemoglobin: 8.5 g/dL — ABNORMAL LOW (ref 12.0–15.0)
MCH: 28.6 pg (ref 26.0–34.0)
MCHC: 34.1 g/dL (ref 30.0–36.0)
MCV: 83.8 fL (ref 80.0–100.0)
Platelets: 141 10*3/uL — ABNORMAL LOW (ref 150–400)
RBC: 2.97 MIL/uL — ABNORMAL LOW (ref 3.87–5.11)
RDW: 18.1 % — ABNORMAL HIGH (ref 11.5–15.5)
WBC: 7.8 10*3/uL (ref 4.0–10.5)
nRBC: 0 % (ref 0.0–0.2)
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
## 2022-01-21 LAB — GLUCOSE, CAPILLARY
Glucose-Capillary: 105 mg/dL — ABNORMAL HIGH (ref 70–99)
Glucose-Capillary: 116 mg/dL — ABNORMAL HIGH (ref 70–99)
Glucose-Capillary: 116 mg/dL — ABNORMAL HIGH (ref 70–99)
Glucose-Capillary: 130 mg/dL — ABNORMAL HIGH (ref 70–99)
Glucose-Capillary: 87 mg/dL (ref 70–99)
Glucose-Capillary: 97 mg/dL (ref 70–99)

## 2022-01-21 LAB — TYPE AND SCREEN
ABO/RH(D): O POS
Antibody Screen: NEGATIVE

## 2022-01-21 MED ORDER — INSULIN GLARGINE-YFGN 100 UNIT/ML ~~LOC~~ SOLN
24.0000 [IU] | Freq: Two times a day (BID) | SUBCUTANEOUS | Status: DC
Start: 2022-01-21 — End: 2022-01-23
  Administered 2022-01-21 – 2022-01-22 (×3): 24 [IU] via SUBCUTANEOUS
  Filled 2022-01-21 (×6): qty 0.24

## 2022-01-21 MED ORDER — INSULIN ASPART 100 UNIT/ML IJ SOLN
12.0000 [IU] | Freq: Every day | INTRAMUSCULAR | Status: DC
  Administered 2022-01-21 – 2022-01-22 (×2): 12 [IU] via SUBCUTANEOUS
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
## 2022-01-22 ENCOUNTER — Inpatient Hospital Stay (HOSPITAL_COMMUNITY): Payer: Medicaid Other | Admitting: Certified Registered Nurse Anesthetist

## 2022-01-22 ENCOUNTER — Encounter (HOSPITAL_COMMUNITY): Payer: Self-pay | Admitting: Obstetrics & Gynecology

## 2022-01-22 ENCOUNTER — Encounter (HOSPITAL_COMMUNITY)
Admission: AD | Disposition: A | Discharge status: Home / Self Care | Payer: Self-pay | Source: Home / Self Care | Attending: Obstetrics and Gynecology

## 2022-01-22 DIAGNOSIS — O4590 Premature separation of placenta, unspecified, unspecified trimester: Secondary | ICD-10-CM | POA: Diagnosis not present

## 2022-01-22 DIAGNOSIS — O4403 Placenta previa specified as without hemorrhage, third trimester: Secondary | ICD-10-CM

## 2022-01-22 DIAGNOSIS — O321XX0 Maternal care for breech presentation, not applicable or unspecified: Secondary | ICD-10-CM

## 2022-01-22 DIAGNOSIS — O1092 Unspecified pre-existing hypertension complicating childbirth: Secondary | ICD-10-CM

## 2022-01-22 DIAGNOSIS — Z98891 History of uterine scar from previous surgery: Secondary | ICD-10-CM

## 2022-01-22 DIAGNOSIS — Z3A29 29 weeks gestation of pregnancy: Secondary | ICD-10-CM

## 2022-01-22 DIAGNOSIS — O42913 Preterm premature rupture of membranes, unspecified as to length of time between rupture and onset of labor, third trimester: Secondary | ICD-10-CM

## 2022-01-22 DIAGNOSIS — O4593 Premature separation of placenta, unspecified, third trimester: Secondary | ICD-10-CM

## 2022-01-22 DIAGNOSIS — O2412 Pre-existing diabetes mellitus, type 2, in childbirth: Secondary | ICD-10-CM

## 2022-01-22 LAB — CBC WITH DIFFERENTIAL/PLATELET
Abs Immature Granulocytes: 0.05 10*3/uL (ref 0.00–0.07)
Basophils Absolute: 0 10*3/uL (ref 0.0–0.1)
Basophils Relative: 0 %
Eosinophils Absolute: 0.2 10*3/uL (ref 0.0–0.5)
Eosinophils Relative: 3 %
HCT: 24.9 % — ABNORMAL LOW (ref 36.0–46.0)
Hemoglobin: 8.2 g/dL — ABNORMAL LOW (ref 12.0–15.0)
Immature Granulocytes: 1 %
Lymphocytes Relative: 14 %
Lymphs Abs: 1 10*3/uL (ref 0.7–4.0)
MCH: 28.2 pg (ref 26.0–34.0)
MCHC: 32.9 g/dL (ref 30.0–36.0)
MCV: 85.6 fL (ref 80.0–100.0)
Monocytes Absolute: 0.5 10*3/uL (ref 0.1–1.0)
Monocytes Relative: 7 %
Neutro Abs: 5.6 10*3/uL (ref 1.7–7.7)
Neutrophils Relative %: 75 %
Platelets: 153 10*3/uL (ref 150–400)
RBC: 2.91 MIL/uL — ABNORMAL LOW (ref 3.87–5.11)
RDW: 18.5 % — ABNORMAL HIGH (ref 11.5–15.5)
WBC: 7.4 10*3/uL (ref 4.0–10.5)
nRBC: 0 % (ref 0.0–0.2)

## 2022-01-22 LAB — COMPREHENSIVE METABOLIC PANEL
ALT: 33 U/L (ref 0–44)
AST: 29 U/L (ref 15–41)
Albumin: 2.7 g/dL — ABNORMAL LOW (ref 3.5–5.0)
Alkaline Phosphatase: 55 U/L (ref 38–126)
Anion gap: 9 (ref 5–15)
BUN: 13 mg/dL (ref 6–20)
CO2: 19 mmol/L — ABNORMAL LOW (ref 22–32)
Calcium: 8.6 mg/dL — ABNORMAL LOW (ref 8.9–10.3)
Chloride: 109 mmol/L (ref 98–111)
Creatinine, Ser: 0.61 mg/dL (ref 0.44–1.00)
GFR, Estimated: >60 mL/min (ref >60)
Glucose, Bld: 82 mg/dL (ref 70–99)
Potassium: 3.9 mmol/L (ref 3.5–5.1)
Sodium: 137 mmol/L (ref 135–145)
Total Bilirubin: 0.5 mg/dL (ref 0.3–1.2)
Total Protein: 5.6 g/dL — ABNORMAL LOW (ref 6.5–8.1)

## 2022-01-22 LAB — GLUCOSE, CAPILLARY
Glucose-Capillary: 70 mg/dL (ref 70–99)
Glucose-Capillary: 123 mg/dL — ABNORMAL HIGH (ref 70–99)
Glucose-Capillary: 82 mg/dL (ref 70–99)
Glucose-Capillary: 102 mg/dL — ABNORMAL HIGH (ref 70–99)

## 2022-01-22 SURGERY — Surgical Case
Anesthesia: Spinal

## 2022-01-22 MED ORDER — KETOROLAC TROMETHAMINE 30 MG/ML IJ SOLN: 30.0000 mg | Freq: Four times a day (QID) | INTRAMUSCULAR | Status: DC | PRN

## 2022-01-22 MED ORDER — NALOXONE HCL 4 MG/10ML IJ SOLN
1.0000 ug/kg/h | INTRAVENOUS | Status: DC | PRN
  Filled 2022-01-22: qty 5

## 2022-01-22 MED ORDER — DIPHENHYDRAMINE HCL 50 MG/ML IJ SOLN: 12.5000 mg | INTRAMUSCULAR | Status: DC | PRN

## 2022-01-22 MED ORDER — SODIUM CHLORIDE 0.9 % IV SOLN
INTRAVENOUS | Status: AC
  Filled 2022-01-22: qty 2

## 2022-01-22 MED ORDER — AMISULPRIDE (ANTIEMETIC) 5 MG/2ML IV SOLN: 10.0000 mg | Freq: Once | INTRAVENOUS | Status: DC

## 2022-01-22 MED ORDER — OXYTOCIN-SODIUM CHLORIDE 30-0.9 UT/500ML-% IV SOLN
INTRAVENOUS | Status: DC | PRN
  Administered 2022-01-22: 500 mL via INTRAVENOUS
  Administered 2022-01-22: 100 mL/h via INTRAVENOUS

## 2022-01-22 MED ORDER — MORPHINE SULFATE (PF) 0.5 MG/ML IJ SOLN
INTRAMUSCULAR | Status: AC
  Filled 2022-01-22: qty 10

## 2022-01-22 MED ORDER — DEXAMETHASONE SODIUM PHOSPHATE 10 MG/ML IJ SOLN
INTRAMUSCULAR | Status: AC
  Filled 2022-01-22: qty 1

## 2022-01-22 MED ORDER — BUPIVACAINE IN DEXTROSE 0.75-8.25 % IT SOLN
INTRATHECAL | Status: DC | PRN
  Administered 2022-01-22: 1.6 mL via INTRATHECAL

## 2022-01-22 MED ORDER — SCOPOLAMINE 1 MG/3DAYS TD PT72: 1.0000 | MEDICATED_PATCH | Freq: Once | TRANSDERMAL | Status: DC

## 2022-01-22 MED ORDER — SODIUM CHLORIDE 0.9 % IV SOLN
500.0000 mg | INTRAVENOUS | Status: AC
  Administered 2022-01-22: 500 mg via INTRAVENOUS

## 2022-01-22 MED ORDER — ONDANSETRON HCL 4 MG/2ML IJ SOLN
INTRAMUSCULAR | Status: DC | PRN
  Administered 2022-01-22: 4 mg via INTRAVENOUS

## 2022-01-22 MED ORDER — ONDANSETRON HCL 4 MG/2ML IJ SOLN
INTRAMUSCULAR | Status: AC
  Filled 2022-01-22: qty 2

## 2022-01-22 MED ORDER — STERILE WATER FOR IRRIGATION IR SOLN
Status: DC | PRN
Start: 2022-01-22 — End: 2022-01-22
  Administered 2022-01-22: 1000 mL

## 2022-01-22 MED ORDER — PHENYLEPHRINE HCL-NACL 20-0.9 MG/250ML-% IV SOLN
INTRAVENOUS | Status: DC | PRN
  Administered 2022-01-22: 60 ug/min via INTRAVENOUS

## 2022-01-22 MED ORDER — ONDANSETRON HCL 4 MG/2ML IJ SOLN
4.0000 mg | Freq: Three times a day (TID) | INTRAMUSCULAR | Status: DC | PRN
  Administered 2022-01-23: 4 mg via INTRAVENOUS
  Filled 2022-01-22: qty 2

## 2022-01-22 MED ORDER — LACTATED RINGERS IV SOLN: INTRAVENOUS | Status: DC | PRN

## 2022-01-22 MED ORDER — SODIUM CHLORIDE 0.9% FLUSH: 3.0000 mL | INTRAVENOUS | Status: DC | PRN

## 2022-01-22 MED ORDER — SODIUM CHLORIDE 0.9 % IR SOLN
Status: DC | PRN
  Administered 2022-01-22: 1

## 2022-01-22 MED ORDER — ACETAMINOPHEN 160 MG/5ML PO SOLN: 325.0000 mg | ORAL | Status: DC | PRN

## 2022-01-22 MED ORDER — DEXAMETHASONE SODIUM PHOSPHATE 10 MG/ML IJ SOLN
INTRAMUSCULAR | Status: DC | PRN
  Administered 2022-01-22: 10 mg via INTRAVENOUS

## 2022-01-22 MED ORDER — SOD CITRATE-CITRIC ACID 500-334 MG/5ML PO SOLN
ORAL | Status: AC
  Filled 2022-01-22: qty 30

## 2022-01-22 MED ORDER — SCOPOLAMINE 1 MG/3DAYS TD PT72
MEDICATED_PATCH | TRANSDERMAL | Status: AC
Start: 2022-01-22 — End: ?
  Filled 2022-01-22: qty 1

## 2022-01-22 MED ORDER — FENTANYL CITRATE (PF) 100 MCG/2ML IJ SOLN
INTRAMUSCULAR | Status: AC
  Filled 2022-01-22: qty 2

## 2022-01-22 MED ORDER — OXYCODONE HCL 5 MG/5ML PO SOLN: 5.0000 mg | Freq: Once | ORAL | Status: DC | PRN

## 2022-01-22 MED ORDER — SOD CITRATE-CITRIC ACID 500-334 MG/5ML PO SOLN
ORAL | Status: AC
  Administered 2022-01-22: 30 mL
  Filled 2022-01-22: qty 30

## 2022-01-22 MED ORDER — ACETAMINOPHEN 325 MG PO TABS: 325.0000 mg | ORAL_TABLET | ORAL | Status: DC | PRN

## 2022-01-22 MED ORDER — SODIUM CHLORIDE 0.9 % IV SOLN
2.0000 g | INTRAVENOUS | Status: AC
  Administered 2022-01-22: 2 g via INTRAVENOUS
  Filled 2022-01-22: qty 2

## 2022-01-22 MED ORDER — SODIUM CHLORIDE 0.9 % IV SOLN
INTRAVENOUS | Status: AC
  Filled 2022-01-22: qty 5

## 2022-01-22 MED ORDER — NALOXONE HCL 0.4 MG/ML IJ SOLN: 0.4000 mg | INTRAMUSCULAR | Status: DC | PRN

## 2022-01-22 MED ORDER — FENTANYL CITRATE (PF) 100 MCG/2ML IJ SOLN
INTRAMUSCULAR | Status: DC | PRN
Start: 2022-01-22 — End: 2022-01-22
  Administered 2022-01-22: 15 ug via INTRATHECAL

## 2022-01-22 MED ORDER — SCOPOLAMINE 1 MG/3DAYS TD PT72
MEDICATED_PATCH | TRANSDERMAL | Status: DC | PRN
Start: 2022-01-22 — End: 2022-01-22
  Administered 2022-01-22: 1 via TRANSDERMAL

## 2022-01-22 MED ORDER — ACETAMINOPHEN 10 MG/ML IV SOLN: 1000.0000 mg | Freq: Once | INTRAVENOUS | Status: DC | PRN

## 2022-01-22 MED ORDER — FENTANYL CITRATE (PF) 100 MCG/2ML IJ SOLN
25.0000 ug | INTRAMUSCULAR | Status: DC | PRN
  Administered 2022-01-22: 50 ug via INTRAVENOUS

## 2022-01-22 MED ORDER — MEPERIDINE HCL 25 MG/ML IJ SOLN: 6.2500 mg | INTRAMUSCULAR | Status: DC | PRN

## 2022-01-22 MED ORDER — OXYCODONE HCL 5 MG PO TABS: 5.0000 mg | ORAL_TABLET | Freq: Once | ORAL | Status: DC | PRN

## 2022-01-22 MED ORDER — TRANEXAMIC ACID-NACL 1000-0.7 MG/100ML-% IV SOLN
1000.0000 mg | INTRAVENOUS | Status: AC
  Administered 2022-01-22: 1000 mg via INTRAVENOUS

## 2022-01-22 MED ORDER — PHENYLEPHRINE HCL-NACL 20-0.9 MG/250ML-% IV SOLN
INTRAVENOUS | Status: AC
  Filled 2022-01-22: qty 250

## 2022-01-22 MED ORDER — DIPHENHYDRAMINE HCL 25 MG PO CAPS: 25.0000 mg | ORAL_CAPSULE | ORAL | Status: DC | PRN

## 2022-01-22 SURGICAL SUPPLY — 34 items
CANISTER PREVENA PLUS 150 (CANNISTER) ×1 IMPLANT
CHLORAPREP W/TINT 26ML (MISCELLANEOUS) ×6 IMPLANT
CLAMP CORD UMBIL (MISCELLANEOUS) ×3 IMPLANT
CLOTH BEACON ORANGE TIMEOUT ST (SAFETY) ×3 IMPLANT
DRESSING PREVENA PLUS CUSTOM (GAUZE/BANDAGES/DRESSINGS) IMPLANT
DRSG OPSITE POSTOP 4X10 (GAUZE/BANDAGES/DRESSINGS) ×2 IMPLANT
DRSG PREVENA PLUS CUSTOM (GAUZE/BANDAGES/DRESSINGS)
ELECT REM PT RETURN 9FT ADLT (ELECTROSURGICAL)
ELECTRODE REM PT RTRN 9FT ADLT (ELECTROSURGICAL) ×2 IMPLANT
EXTRACTOR VACUUM M CUP 4 TUBE (SUCTIONS) IMPLANT
GAUZE SPONGE 4X4 12PLY STRL LF (GAUZE/BANDAGES/DRESSINGS) IMPLANT
GLOVE BIOGEL PI IND STRL 7.0 (GLOVE) ×6 IMPLANT
GLOVE BIOGEL PI INDICATOR 7.0 (GLOVE) ×3
GLOVE ECLIPSE 7.0 STRL STRAW (GLOVE) ×3 IMPLANT
GOWN STRL REUS W/TWL LRG LVL3 (GOWN DISPOSABLE) ×6 IMPLANT
KIT ABG SYR 3ML LUER SLIP (SYRINGE) IMPLANT
NDL HYPO 25X5/8 SAFETYGLIDE (NEEDLE) ×2 IMPLANT
NEEDLE HYPO 22GX1.5 SAFETY (NEEDLE) ×3 IMPLANT
NEEDLE HYPO 25X5/8 SAFETYGLIDE (NEEDLE) ×2 IMPLANT
NS IRRIG 1000ML POUR BTL (IV SOLUTION) ×3 IMPLANT
PACK C SECTION WH (CUSTOM PROCEDURE TRAY) ×3 IMPLANT
PAD ABD 7.5X8 STRL (GAUZE/BANDAGES/DRESSINGS) ×2 IMPLANT
PAD OB MATERNITY 4.3X12.25 (PERSONAL CARE ITEMS) ×3 IMPLANT
PENCIL SMOKE EVAC W/HOLSTER (ELECTROSURGICAL) IMPLANT
RTRCTR C-SECT PINK 25CM LRG (MISCELLANEOUS) IMPLANT
SUT PDS AB 0 CTX 36 PDP370T (SUTURE) IMPLANT
SUT PLAIN 2 0 XLH (SUTURE) IMPLANT
SUT VIC AB 0 CTX 36 (SUTURE) ×4
SUT VIC AB 0 CTX36XBRD ANBCTRL (SUTURE) ×4 IMPLANT
SUT VIC AB 4-0 KS 27 (SUTURE) ×3 IMPLANT
SYR CONTROL 10ML LL (SYRINGE) ×3 IMPLANT
TOWEL OR 17X24 6PK STRL BLUE (TOWEL DISPOSABLE) ×3 IMPLANT
TRAY FOLEY W/BAG SLVR 14FR LF (SET/KITS/TRAYS/PACK) ×3 IMPLANT
WATER STERILE IRR 1000ML POUR (IV SOLUTION) ×3 IMPLANT
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
## 2022-01-22 NOTE — Anesthesia Procedure Notes (Signed)
Spinal ? ?Start time: 01/22/2022 8:32 PM ?End time: 01/22/2022 8:34 PM ?Reason for block: surgical anesthesia ?Staffing ?Performed: anesthesiologist  ?Anesthesiologist: Effie Berkshire, MD ?Preanesthetic Checklist ?Completed: patient identified, IV checked, site marked, risks and benefits discussed, surgical consent, monitors and equipment checked, pre-op evaluation and timeout performed ?Spinal Block ?Patient position: sitting ?Prep: DuraPrep and site prepped and draped ?Location: L3-4 ?Injection technique: single-shot ?Needle ?Needle type: Pencan  ?Needle gauge: 24 G ?Needle length: 10 cm ?Needle insertion depth: 10 cm ?Additional Notes ?Patient tolerated well. No immediate complications. ? ?Functioning IV was confirmed and monitors were applied. Sterile prep and drape, including hand hygiene and sterile gloves were used. The patient was positioned and the back was prepped. The skin was anesthetized with lidocaine. Free flow of clear CSF was obtained prior to injecting local anesthetic into the CSF. The spinal needle aspirated freely following injection. The needle was carefully withdrawn. The patient tolerated the procedure well. ? ? ? ? ?

## 2022-01-22 NOTE — Anesthesia Preprocedure Evaluation (Addendum)
Anesthesia Evaluation  ?Patient identified by MRN, date of birth, ID band ?Patient awake ? ? ? ?Reviewed: ?Allergy & Precautions, NPO status , Patient's Chart, lab work & pertinent test results ? ?Airway ?Mallampati: III ? ? ? ? ? ? Dental ?no notable dental hx. ? ?  ?Pulmonary ? ?  ?Pulmonary exam normal ? ? ? ? ? ? ? Cardiovascular ?hypertension, Normal cardiovascular exam ? ?Echo: ?- Left ventricle: The cavity size was normal. Wall thickness was  ???normal. Systolic function was normal. The estimated ejection  ???fraction was in the range of 60% to 65%. Wall motion was normal;  ???there were no regional wall motion abnormalities. Left  ???ventricular diastolic function parameters were normal.  ?- Aortic valve: There was mild regurgitation.  ?- Atrial septum: No defect or patent foramen ovale was identified.  ?- Tricuspid valve: There was mild regurgitation.  ?  ?Neuro/Psych ? Headaches, Seizures -,  CVA   ? GI/Hepatic ?negative GI ROS, Neg liver ROS,   ?Endo/Other  ?diabetes ? Renal/GU ?negative Renal ROS  ? ?  ?Musculoskeletal ? ? Abdominal ?  ?Peds ? Hematology ?  ?Anesthesia Other Findings ? ? Reproductive/Obstetrics ?(+) Pregnancy ? ?  ? ? ? ? ? ? ? ? ? ? ? ? ? ?  ?  ? ? ? ? ? ? ? ?Anesthesia Physical ?Anesthesia Plan ? ?ASA: 3 ? ?Anesthesia Plan: Spinal  ? ?Post-op Pain Management:   ? ?Induction:  ? ?PONV Risk Score and Plan: 0 ? ?Airway Management Planned: Natural Airway ? ?Additional Equipment: None ? ?Intra-op Plan:  ? ?Post-operative Plan:  ? ?Informed Consent: I have reviewed the patients History and Physical, chart, labs and discussed the procedure including the risks, benefits and alternatives for the proposed anesthesia with the patient or authorized representative who has indicated his/her understanding and acceptance.  ? ? ? ? ? ?Plan Discussed with: CRNA ? ?Anesthesia Plan Comments: (Lab Results ?     Component                Value               Date                  ?     WBC                      7.4                 01/22/2022           ?     HGB                      8.2 (L)             01/22/2022           ?     HCT                      24.9 (L)            01/22/2022           ?     MCV                      85.6                01/22/2022           ?  PLT                      153                 01/22/2022           ?)  ? ? ? ? ? ?Anesthesia Quick Evaluation ? ?

## 2022-01-22 NOTE — Progress Notes (Signed)
? ? ?Water engineer OB/GYN Attending Note ? ?Subjective:  ?Called to evaluate patient with new onset vaginal bleeding with abdominal pain radiating to her back. Patient has been admitted from PPROM that occurred at 18 weeks, also has known posterior previa. Good FM.  ? ?Admitted on 12/08/2021 for Preterm premature rupture of membranes (PPROM) with onset of labor after 24 hours of rupture in second trimester, antepartum. ? ?Patient Active Problem List  ? Diagnosis Date Noted  ? Fetal growth restriction antepartum 01/16/2022  ? Constipation 01/16/2022  ? Malpresentation before onset of labor 12/20/2021  ? BMI 38.0-38.9,adult 11/15/2021  ? Abnormal genetic test during pregnancy 11/15/2021  ? Type 2 diabetes mellitus without complication, without long-term current use of insulin ([place]) 11/15/2021  ? Preterm premature rupture of membranes (PPROM) at [redacted] weeks gestation, antepartum 11/14/2021  ? Placenta previa 11/09/2021  ? Red Chart Rounds Patient 10/20/2021  ? Proteinuria affecting pregnancy, antepartum 09/17/2021  ? Rubella non-immune status, antepartum 09/16/2021  ? Anemia affecting pregnancy in second trimester 09/16/2021  ? History of severe preeclampsia, prior pregnancy, currently pregnant 09/15/2021  ? Previous cesarean delivery, antepartum 09/15/2021  ? Migraine headache 08/04/2020  ? Benign gestational thrombocytopenia in second trimester ([place]) 07/15/2020  ? 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[place]) 03/24/2020  ? Chronic hypertension affecting pregnancy 03/24/2020  ? Obesity in pregnancy, antepartum 03/24/2020  ? Supervision of high risk pregnancy, antepartum 02/04/2020  ? Preexisting diabetes complicating pregnancy, antepartum 02/04/2020  ? Seizures ([place])   ? Non-compliance 07/22/2017  ? History of TIA (transient ischemic attack) and stroke 03/03/2017  ? ? ?   ?Objective:  ?Blood pressure 116/62, pulse 85, temperature 98.6 ?F (37 ?C),  temperature source Oral, resp. rate 16, height '5\' 2"'$  (1.575 m), weight 93.9 kg, last menstrual period 06/30/2021, SpO2 98 %, not currently breastfeeding. ?FHT  Baseline 145 bpm, moderate variability,  no accelerations, no decelerations ?Toco: flat ?Gen: In some distress due to pain ?HENT: Normocephalic, atraumatic ?Lungs: Normal respiratory effort ?Heart: Regular rate noted ?Abdomen: Diffuse fundal tenderness, gravid fundus, soft ?Cervix: One pad soaked with bright red blood noted, ongoing slow bleeding (RN used as chaperone) ?Ext: 2+ DTRs, no edema, no cyanosis, negative Homan's sign ? ?Results: ? ?  Latest Ref Rng & Units 01/22/2022  ?  6:53 PM 01/20/2022  ?  9:36 AM 01/17/2022  ? 10:06 AM  ?CBC  ?WBC 4.0 - 10.5 K/uL 7.4   7.8   7.0    ?Hemoglobin 12.0 - 15.0 g/dL 8.2   8.5   8.5    ?Hematocrit 36.0 - 46.0 % 24.9   24.9   26.7    ?Platelets 150 - 400 K/uL 153   141   172    ? ? ?  Latest Ref Rng & Units 01/22/2022  ?  6:53 PM 01/20/2022  ?  9:36 AM 01/17/2022  ? 10:06 AM  ?CMP  ?Glucose 70 - 99 mg/dL 82   135   117    ?BUN 6 - 20 mg/dL '13   13   12    '$ ?Creatinine 0.44 - 1.00 mg/dL 0.61   0.55   0.51    ?Sodium 135 - 145 mmol/L 137   138   139    ?Potassium 3.5 - 5.1 mmol/L 3.9   3.8   4.0    ?Chloride 98 - 111 mmol/L 109   110   109    ?CO2 22 - 32  mmol/L '19   20   20    '$ ?Calcium 8.9 - 10.3 mg/dL 8.6   9.2   9.1    ?Total Protein 6.5 - 8.1 g/dL 5.6   5.6   6.1    ?Total Bilirubin 0.3 - 1.2 mg/dL 0.5   0.6   0.6    ?Alkaline Phos 38 - 126 U/L 55   53   59    ?AST 15 - 41 U/L '29   26   30    '$ ?ALT 0 - 44 U/L 33   30   43    ? ? ? ?Assessment & Plan:  ?36 y.o. G2P0101 at 44w3dadmitted for PPROM, previa now with bleeding.  Could be due to previa or abruption given prolonged PPROM.  Cesarean section recommended.  The risks of surgery were discussed with the patient including but were not limited to: bleeding which may require transfusion or reoperation; infection which may require antibiotics; injury to bowel, bladder, ureters or  other surrounding organs; injury to the fetus; need for additional procedures including hysterectomy in the event of a life-threatening hemorrhage; formation of adhesions; placental abnormalities with subsequent pregnancies; incisional problems; thromboembolic phenomenon and other postoperative/anesthesia complications.  The patient concurred with the proposed plan, giving informed written consent for the procedure.   Preoperative prophylactic antibiotics and SCDs ordered on call to the OR.  OR team, Anesthesiology and Neonatology aware of plan.  To OR when ready. ? ?   ?UVerita Schneiders MD, FACOG ?Obstetrician &Social research officer, government Faculty Practice ?[facility]?

## 2022-01-22 NOTE — Op Note (Signed)
Joann Meyers ?PROCEDURE DATE: 01/22/2022 ? ?PREOPERATIVE DIAGNOSES: Intrauterine pregnancy at 18w3dweeks gestation;abruptio placenta; posterior placenta previa; prolonged premature rupture of membranes since [redacted] weeks gestation; breech presentation; chronic hypertension, Type 2 diabetes, history of carotid artery dissection and stroke ? ?POSTOPERATIVE DIAGNOSES: The same ? ?PROCEDURE: Low Transverse Cesarean Section ? ?SURGEON:  Dr. UVerita Schneiders? ?ANESTHESIOLOGY TEAM: Anesthesiologist: HEffie Berkshire MD ?CRNA: SReece Agar CRNA ? ?INDICATIONS: Joann Meyers a 36y.o. G2P0101 at 255w3dere for cesarean section secondary to the indications listed under preoperative diagnoses; please see preoperative note for further details.  The risks of surgery were discussed with the patient including but were not limited to: bleeding which may require transfusion or reoperation; infection which may require antibiotics; injury to bowel, bladder, ureters or other surrounding organs; injury to the fetus; need for additional procedures including hysterectomy in the event of a life-threatening hemorrhage; formation of adhesions; placental abnormalities wth subsequent pregnancies; incisional problems; thromboembolic phenomenon and other postoperative/anesthesia complications.  The patient concurred with the proposed plan, giving informed written consent for the procedure.   ? ?FINDINGS:  Viable female infant in frank breech presentation.  Apgars 6 and 8, weight 1280g. Arterial cord pH pending at time of note.  Large placental abruption noted with bloody amniotic fluid, about 400 ml of blood  and clots noted in uterus. Intact placenta, three vessel cord.  Normal uterus, fallopian tubes and ovaries bilaterally. There was some adhesions of the omentum to anterior abdominal wall that were easily lysed, but no uterine or adnexal adhesions. Prevena was placed at the end of the case. ? ?ANESTHESIA: Spinal ?INTRAVENOUS FLUIDS:  1750 ml   ?ESTIMATED BLOOD LOSS: 509 ml ?URINE OUTPUT:  125 ml ?SPECIMENS: Placenta sent to pathology ?COMPLICATIONS: None immediate ? ?PROCEDURE IN DETAIL:  The patient preoperatively received intravenous antibiotics and had sequential compression devices applied to her lower extremities.  She was then taken to the operating room where spinal anesthesia was administered and was found to be adequate. She was then placed in a dorsal supine position with a leftward tilt, and prepped and draped in a sterile manner.  A foley catheter was placed into her bladder and attached to constant gravity.  After an adequate timeout was performed, a Pfannenstiel skin incision was made with scalpel on her preexisting scar and carried through to the underlying layer of fascia.  The fascia was incised in the midline, and this incision was extended bilaterally using the Mayo scissors.  Kocher clamps were applied to the superior aspect of the fascial incision and the underlying rectus muscles were dissected off bluntly and sharply.  A similar process was carried out on the inferior aspect of the fascial incision. The rectus muscles were separated in the midline and the peritoneum was entered sharply. The Alexis self-retaining retractor was introduced into the abdominal cavity.  Attention was turned to the lower uterine segment where a low transverse hysterotomy was made with a scalpel and extended bilaterally bluntly.  The infant was successfully delivered, the cord was clamped and cut after one minute, and the infant was handed over to the awaiting neonatology team. Uterine massage was then administered, and the placenta delivered intact with a three-vessel cord. The uterus was then cleared of clots and debris.  The hysterotomy was closed with 0 Vicryl in a running locked fashion, and an imbricating layer was also placed with 0 Vicryl.  Figure-of-eight 0 Vicryl serosal stitches were placed to help with hemostasis.  The pelvis was  cleared of all clot and debris. Hemostasis was confirmed on all surfaces.  The retractor was removed.  The rectus muscles were reapproximated using  one 0 Vicryl interrupted stitch. The fascia was then closed using 0 PDS in a running fashion.  The subcutaneous layer was irrigated, reapproximated with 2-0 plain gut interrupted stitches, and the skin was closed with a 4-0 Vicryl subcuticular stitch. After the skin was closed, a Prevena disposable negative pressure wound therapy device was placed over the incision.  The suction was activated at a pressure of -125 mmHg.  The adhesive was affixed well and there were no leaks noted.  The patient tolerated the procedure well. Sponge, instrument and needle counts were correct x 3.  She was taken to the recovery room in stable condition.  ? ? ?Verita Schneiders, MD, FACOG ?Obstetrician Social research officer, government, Faculty Practice ?[facility] [place] ? ? ?

## 2022-01-22 NOTE — Transfer of Care (Signed)
Immediate Anesthesia Transfer of Care Note ? ?Patient: Joann Meyers ? ?Procedure(s) Performed: CESAREAN SECTION ? ?Patient Location: PACU ? ?Anesthesia Type:Spinal ? ?Level of Consciousness: awake and alert  ? ?Airway & Oxygen Therapy: Patient Spontanous Breathing ? ?Post-op Assessment: Report given to RN and Post -op Vital signs reviewed and stable ? ?Post vital signs: Reviewed and stable ? ?Last Vitals:  ?Vitals Value Taken Time  ?BP 132/81 01/22/22 2210  ?Temp 36.7 ?C 01/22/22 2210  ?Pulse 93 01/22/22 2212  ?Resp 23 01/22/22 2212  ?SpO2 97 % 01/22/22 2212  ?Vitals shown include unvalidated device data. ? ?Last Pain:  ?Vitals:  ? 01/22/22 2210  ?TempSrc: Oral  ?PainSc:   ?   ? ?Patients Stated Pain Goal: 3 (01/20/22 0901) ? ?Complications: No notable events documented. ?

## 2022-01-22 NOTE — Progress Notes (Signed)
Patient c/o of new onset of vaginal bleeding and increased lower abdominal and lower back pain at 1943. Moderate bright red blood found on pad with ongoing slow leakage. EFM placed, FHR found at 140bpm. Dr. Harolyn Rutherford notified at [place]. Urgent C/S called at [place]. Pre-op checklist completed.  ?

## 2022-01-22 NOTE — Progress Notes (Signed)
Patient c/o lower abdominal pain and cramping.  Also states DFM this afternoon.  Monitors placed, FHT 135.  Will continue to monitor. ?

## 2022-01-23 ENCOUNTER — Encounter (HOSPITAL_COMMUNITY): Payer: Self-pay | Admitting: Obstetrics & Gynecology

## 2022-01-23 LAB — COMPREHENSIVE METABOLIC PANEL
ALT: 31 U/L (ref 0–44)
AST: 26 U/L (ref 15–41)
Albumin: 2.4 g/dL — ABNORMAL LOW (ref 3.5–5.0)
Alkaline Phosphatase: 53 U/L (ref 38–126)
Anion gap: 8 (ref 5–15)
BUN: 12 mg/dL (ref 6–20)
CO2: 19 mmol/L — ABNORMAL LOW (ref 22–32)
Calcium: 8.3 mg/dL — ABNORMAL LOW (ref 8.9–10.3)
Chloride: 110 mmol/L (ref 98–111)
Creatinine, Ser: 0.61 mg/dL (ref 0.44–1.00)
GFR, Estimated: >60 mL/min (ref >60)
Glucose, Bld: 158 mg/dL — ABNORMAL HIGH (ref 70–99)
Potassium: 3.9 mmol/L (ref 3.5–5.1)
Sodium: 137 mmol/L (ref 135–145)
Total Bilirubin: 0.8 mg/dL (ref 0.3–1.2)
Total Protein: 5.4 g/dL — ABNORMAL LOW (ref 6.5–8.1)

## 2022-01-23 LAB — CBC
HCT: 22.1 % — ABNORMAL LOW (ref 36.0–46.0)
Hemoglobin: 7.5 g/dL — ABNORMAL LOW (ref 12.0–15.0)
MCH: 28.7 pg (ref 26.0–34.0)
MCHC: 33.9 g/dL (ref 30.0–36.0)
MCV: 84.7 fL (ref 80.0–100.0)
Platelets: 136 10*3/uL — ABNORMAL LOW (ref 150–400)
RBC: 2.61 MIL/uL — ABNORMAL LOW (ref 3.87–5.11)
RDW: 18.4 % — ABNORMAL HIGH (ref 11.5–15.5)
WBC: 8.4 10*3/uL (ref 4.0–10.5)
nRBC: 0 % (ref 0.0–0.2)

## 2022-01-23 LAB — GLUCOSE, CAPILLARY
Glucose-Capillary: 134 mg/dL — ABNORMAL HIGH (ref 70–99)
Glucose-Capillary: 139 mg/dL — ABNORMAL HIGH (ref 70–99)
Glucose-Capillary: 147 mg/dL — ABNORMAL HIGH (ref 70–99)
Glucose-Capillary: 80 mg/dL (ref 70–99)
Glucose-Capillary: 137 mg/dL — ABNORMAL HIGH (ref 70–99)

## 2022-01-23 MED ORDER — SENNOSIDES-DOCUSATE SODIUM 8.6-50 MG PO TABS
2.0000 | ORAL_TABLET | Freq: Every day | ORAL | Status: DC
  Administered 2022-01-23 – 2022-01-24 (×2): 2 via ORAL
  Filled 2022-01-23 (×2): qty 2

## 2022-01-23 MED ORDER — IRON SUCROSE 20 MG/ML IV SOLN
500.0000 mg | Freq: Once | INTRAVENOUS | Status: AC
  Administered 2022-01-23: 500 mg via INTRAVENOUS
  Filled 2022-01-23: qty 25

## 2022-01-23 MED ORDER — INSULIN ASPART 100 UNIT/ML IJ SOLN
0.0000 [IU] | Freq: Three times a day (TID) | INTRAMUSCULAR | Status: DC
  Administered 2022-01-23: 2 [IU] via SUBCUTANEOUS

## 2022-01-23 MED ORDER — OXYCODONE-ACETAMINOPHEN 5-325 MG PO TABS: 2.0000 | ORAL_TABLET | ORAL | Status: DC | PRN

## 2022-01-23 MED ORDER — TETANUS-DIPHTH-ACELL PERTUSSIS 5-2.5-18.5 LF-MCG/0.5 IM SUSY: 0.5000 mL | PREFILLED_SYRINGE | Freq: Once | INTRAMUSCULAR | Status: DC

## 2022-01-23 MED ORDER — MAGNESIUM HYDROXIDE 400 MG/5ML PO SUSP: 30.0000 mL | ORAL | Status: DC | PRN

## 2022-01-23 MED ORDER — ASPIRIN EC 325 MG PO TBEC
325.0000 mg | DELAYED_RELEASE_TABLET | Freq: Every day | ORAL | Status: DC
  Administered 2022-01-23 – 2022-01-24 (×2): 325 mg via ORAL
  Filled 2022-01-23 (×2): qty 1

## 2022-01-23 MED ORDER — OXYTOCIN-SODIUM CHLORIDE 30-0.9 UT/500ML-% IV SOLN: 2.5000 [IU]/h | INTRAVENOUS | Status: AC

## 2022-01-23 MED ORDER — FERROUS SULFATE 325 (65 FE) MG PO TABS
325.0000 mg | ORAL_TABLET | ORAL | Status: DC
  Administered 2022-01-23: 325 mg via ORAL
  Filled 2022-01-23: qty 1

## 2022-01-23 MED ORDER — LACTATED RINGERS IV SOLN: INTRAVENOUS | Status: DC

## 2022-01-23 MED ORDER — COMPLETENATE 29-1 MG PO CHEW
1.0000 | CHEWABLE_TABLET | Freq: Every day | ORAL | Status: DC
  Administered 2022-01-24: 1 via ORAL
  Filled 2022-01-23: qty 1

## 2022-01-23 MED ORDER — ACETAMINOPHEN 500 MG PO TABS
1000.0000 mg | ORAL_TABLET | Freq: Four times a day (QID) | ORAL | Status: DC
  Administered 2022-01-23 – 2022-01-24 (×6): 1000 mg via ORAL
  Filled 2022-01-23 (×7): qty 2

## 2022-01-23 MED ORDER — INSULIN ASPART 100 UNIT/ML IJ SOLN: 0.0000 [IU] | Freq: Every day | INTRAMUSCULAR | Status: DC

## 2022-01-23 MED ORDER — GABAPENTIN 300 MG PO CAPS
300.0000 mg | ORAL_CAPSULE | Freq: Two times a day (BID) | ORAL | Status: DC
  Administered 2022-01-23 – 2022-01-24 (×4): 300 mg via ORAL
  Filled 2022-01-23 (×4): qty 1

## 2022-01-23 MED ORDER — ZOLPIDEM TARTRATE 5 MG PO TABS: 5.0000 mg | ORAL_TABLET | Freq: Every evening | ORAL | Status: DC | PRN

## 2022-01-23 MED ORDER — SIMETHICONE 80 MG PO CHEW
80.0000 mg | CHEWABLE_TABLET | ORAL | Status: DC | PRN
  Administered 2022-01-23 (×2): 80 mg via ORAL
  Filled 2022-01-23 (×2): qty 1

## 2022-01-23 MED ORDER — WITCH HAZEL-GLYCERIN EX PADS
1.0000 | MEDICATED_PAD | CUTANEOUS | Status: DC | PRN
Start: 2022-01-23 — End: 2022-01-24

## 2022-01-23 MED ORDER — HYDROMORPHONE HCL 1 MG/ML IJ SOLN
1.0000 mg | INTRAMUSCULAR | Status: DC | PRN
  Administered 2022-01-23 (×2): 1 mg via INTRAVENOUS
  Filled 2022-01-23 (×2): qty 1

## 2022-01-23 MED ORDER — KETOROLAC TROMETHAMINE 30 MG/ML IJ SOLN
30.0000 mg | Freq: Four times a day (QID) | INTRAMUSCULAR | Status: AC
  Administered 2022-01-23 (×4): 30 mg via INTRAVENOUS
  Filled 2022-01-23 (×4): qty 1

## 2022-01-23 MED ORDER — OXYCODONE HCL 5 MG PO TABS: 5.0000 mg | ORAL_TABLET | ORAL | Status: DC | PRN

## 2022-01-23 MED ORDER — DIPHENHYDRAMINE HCL 25 MG PO CAPS: 25.0000 mg | ORAL_CAPSULE | Freq: Four times a day (QID) | ORAL | Status: DC | PRN

## 2022-01-23 MED ORDER — MEASLES, MUMPS & RUBELLA VAC IJ SOLR
0.5000 mL | Freq: Once | INTRAMUSCULAR | Status: AC
  Administered 2022-01-24: 0.5 mL via SUBCUTANEOUS
  Filled 2022-01-23: qty 0.5

## 2022-01-23 MED ORDER — MENTHOL 3 MG MT LOZG: 1.0000 | LOZENGE | OROMUCOSAL | Status: DC | PRN

## 2022-01-23 MED ORDER — IBUPROFEN 600 MG PO TABS
600.0000 mg | ORAL_TABLET | Freq: Four times a day (QID) | ORAL | Status: DC
  Administered 2022-01-24 (×2): 600 mg via ORAL
  Filled 2022-01-23 (×3): qty 1

## 2022-01-23 MED ORDER — COCONUT OIL OIL
1.0000 "application " | TOPICAL_OIL | Status: DC | PRN
  Administered 2022-01-23: 1 via TOPICAL

## 2022-01-23 MED ORDER — DIBUCAINE (PERIANAL) 1 % EX OINT: 1.0000 "application " | TOPICAL_OINTMENT | CUTANEOUS | Status: DC | PRN

## 2022-01-23 NOTE — Progress Notes (Signed)
Postpartum Day 1: Cesarean Delivery at 38w4dfor placental abruption, in the setting of prolonged PPROM, previa, T2DM, CHTN ? ?Subjective: ?Patient reports incisional pain and tolerating PO.  No flatus yet.  Patient denies any headaches, visual symptoms, RUQ/epigastric pain or other concerning symptoms. ?Baby is stable in NICU ? ?Objective: ?Vital signs in last 24 hours: ?Temp:  [97.6 ?F (36.4 ?C)-98.6 ?F (37 ?C)] 97.6 ?F (36.4 ?C) (04/10 0458) ?Pulse Rate:  [85-94] 92 (04/10 0458) ?Resp:  [12-30] 19 (04/10 0458) ?BP: (113-140)/(47-99) 127/66 (04/10 0458) ?SpO2:  [97 %-100 %] 98 % (04/10 0458) ? ?Physical Exam:  ?General: alert and no distress ?Lochia: appropriate ?Uterine Fundus: firm ?Incision: Prevena in place, C/D/I ?DVT Evaluation: No evidence of DVT seen on physical exam. Negative Homan's sign. ?No cords or calf tenderness. ? ?Recent Labs  ?  01/22/22 ?1853 01/23/22 ?0420  ?HGB 8.2* 7.5*  ?HCT 24.9* 22.1*  ? ? Latest Reference Range & Units 01/22/22 08:03 01/22/22 11:28 01/22/22 16:13 01/22/22 23:11 01/23/22 01:27  ?Glucose-Capillary 70 - 99 mg/dL 70 123 (H) 82 102 (H) 134 (H)  ? ?Assessment/Plan: ?Status post Cesarean section. Doing well postoperatively.  ?Has acute on chronic blood loss anemia due to abruption, operative blood loss. Asymptomatic, she declines transfusion. Venofer ordered. ?Continue Procardia XL 30 qd, Labetalol 300 mg tid for now for BP control ?Continue Metformin 1000 mg po bid. As per DM coordinator recommendations, used Glycemic Control Order set to order CBGs AC&HS, Novolog 0-15 units TID with meals, Novolog 0-5 units QHS. If glucose becomes consistently over 180 mg/dl, will consider ordering Semglee 10 units Q24H. ?Breastfeeding, desires condoms for contraception ?Unable to have Lovenox/Heparin for VTE prophylaxis because of religions reasons (they are pork derived), ASA 325 mg started. Continue SCDs.  ?Continue current care. ? ?UVerita Schneiders MD ?01/23/2022, 8:28 AM ? ? ?

## 2022-01-23 NOTE — Lactation Note (Addendum)
This note was copied from a baby's chart. ? ?NICU Lactation Consultation Note ? ?Patient Name: Joann Meyers ?Today's Date: 01/23/2022 ?Age:35 hours ? ? ?Subjective ?Reason for consult: L&D Initial assessment; NICU baby; Preterm <34wks ? ?Lactation conducted initial assessment. Provider in room prior to entry, and then another provider trying to see mom after our consult began. I briefly introduced myself and stated my role in supporting her with breast pumping and feeding. Ms. Dorough states that she has pumped before and she is familiar with the DEBP. I reviewed the settings and made sure all of her pump pieces were assembled. She has not yet initated pumping (16 hours). ? ?She deferred pumping at this time (she was going to play Bingo), but she states that she will initiate pumping at 1500. I recommended that she pump again at 1800 and 2100, etc., q3 hours. ? ?Ms. Burright verbalized consent for a [facility] referral. She does not currently own a breast pump. ? ?I provided our literature and verbalized that we would follow up with more education tomorrow. ? ?Baby is Joann Meyers. ? ?Objective ? ?Infant feeding assessment ? ?  ?Maternal data: ?[id number]  ?C-Section, Low Transverse ?Significant Breast History:: no changes in pregnancy ? ?Current breast feeding challenges:: NICU; Preterm ? ?Previous breastfeeding challenges?: Other (Comment) (Previous child was preterm) ? ?Does the patient have breastfeeding experience prior to this delivery?: Yes ? ?Pumping frequency: recommended q3 hours ?Pumped volume: 0 mL (has not pumped yet) ? ? ?[place] Program: Yes ?[place] Referral Sent?: Yes ?Pump:  (Needs WIC pump) ? ?Assessment ? ?Feeding Status: NPO ? ?Intervention/Plan ?Interventions: Breast feeding basics reviewed; Education; [employer] brochure; "The NICU and Your Baby" book ? ?Tools: Pump ?Pump Education: Setup, frequency, and cleaning ? ?Plan: ?Consult Status: Follow-up ? ?NICU Follow-up type: New admission follow up ? ? ? ?Lenore Manner ?01/23/2022, 1:57 PM ?

## 2022-01-23 NOTE — Anesthesia Postprocedure Evaluation (Signed)
Anesthesia Post Note ? ?Patient: [name] ? ?Procedure(s) Performed: CESAREAN SECTION ? ?  ? ?Patient location during evaluation: PACU ?Anesthesia Type: Spinal ?Level of consciousness: oriented and awake and alert ?Pain management: pain level controlled ?Vital Signs Assessment: post-procedure vital signs reviewed and stable ?Respiratory status: spontaneous breathing, respiratory function stable and patient connected to nasal cannula oxygen ?Cardiovascular status: blood pressure returned to baseline and stable ?Postop Assessment: no headache, no backache, no apparent nausea or vomiting and spinal receding ?Anesthetic complications: no ?Comments: Nausea treated and improving ? ? ?No notable events documented. ? ?Last Vitals:  ?Vitals:  ? 01/22/22 2345 01/23/22 0000  ?BP: 128/67 (!) 140/47  ?Pulse: 90   ?Resp: (!) 26 20  ?Temp:  36.7 ?C  ?SpO2: 97% 100%  ?  ?Last Pain:  ?Vitals:  ? 01/23/22 0000  ?TempSrc: Oral  ?PainSc: 10-Worst pain ever  ? ?Pain Goal: Patients Stated Pain Goal: 4 (01/23/22 0000) ? ?  ?  ?  ?  ?  ?  ?  ? ?Joann Meyers ? ? ? ? ?

## 2022-01-23 NOTE — Progress Notes (Signed)
Inpatient Diabetes Program Recommendations ? ?AACE/ADA: New Consensus Statement on Inpatient Glycemic Control  ? ?Target Ranges:  Prepandial:   less than 140 mg/dL ?     Peak postprandial:   less than 180 mg/dL (1-2 hours) ?     Critically ill patients:  140 - 180 mg/dL  ? ? Latest Reference Range & Units 01/22/22 08:03 01/22/22 11:28 01/22/22 16:13 01/22/22 23:11 01/23/22 01:27  ?Glucose-Capillary 70 - 99 mg/dL 70 123 (H) 82 102 (H) 134 (H)  ? ?Review of Glycemic Control ? ?Diabetes history: DM2 ?Outpatient Diabetes medications: Lantus 14 units BID, Novolog 18 units TID with meals, Metformin 1000 mg BID;  (prior to pregnancy Metformin 1000 mg BID, Glipizide 10 mg BID)  ?Current orders for Inpatient glycemic control: Semglee 24 units BID, Novolog 12 units TID with meals, Novolog 0-14 units TID with meals, Metformin 1000 mg BID ? ?Inpatient Diabetes Program Recommendations:   ? ?Insulin: Please discontinue Semglee 24 units BID, Novolog 12 units TID with meals, and Novolog 0-14 units TID and use Glycemic Control order set to order CBGs AC&HS, Novolog 0-15 units TID with meals and Novolog 0-5 units QHS. If glucose becomes consistently over 180 mg/dl, consider ordering Semglee 10 units Q24H. ? ?NOTE: Per chart, patient had c-section on 01/22/22 at 22:12 and received Decadron 10 mg at 21:12 on 4/9. Since patient has delivered, insulin needs should decrease significantly. Per chart, patient was taking Metformin and Glipizide prior to pregnancy. Would recommend discontinuing all SQ insulin orders currently ordered and use Glycemic Control Order set to order CBGs AC&HS, Novolog 0-15 units TID with meals, Novolog 0-5 units QHS and continue Metformin at this time. ? ?Thanks, ?Barnie Alderman, RN, MSN, CDE ?Diabetes Coordinator ?Inpatient Diabetes Program ?[phone] (Team Pager from 8am to 5pm) ? ? ? ?

## 2022-01-24 ENCOUNTER — Other Ambulatory Visit (HOSPITAL_COMMUNITY): Payer: Self-pay

## 2022-01-24 LAB — GLUCOSE, CAPILLARY
Glucose-Capillary: 117 mg/dL — ABNORMAL HIGH (ref 70–99)
Glucose-Capillary: 131 mg/dL — ABNORMAL HIGH (ref 70–99)
Glucose-Capillary: 91 mg/dL (ref 70–99)

## 2022-01-24 LAB — SURGICAL PATHOLOGY

## 2022-01-24 MED ORDER — GABAPENTIN 300 MG PO CAPS
300.0000 mg | ORAL_CAPSULE | Freq: Two times a day (BID) | ORAL | 1 refills | Status: DC
  Filled 2022-01-24: qty 30, 15d supply, fill #0
  Filled 2022-06-19: qty 30, 15d supply, fill #1

## 2022-01-24 MED ORDER — IBUPROFEN 600 MG PO TABS
600.0000 mg | ORAL_TABLET | Freq: Four times a day (QID) | ORAL | 0 refills | Status: DC
  Filled 2022-01-24: qty 30, 8d supply, fill #0

## 2022-01-24 MED ORDER — LABETALOL HCL 300 MG PO TABS
300.0000 mg | ORAL_TABLET | Freq: Two times a day (BID) | ORAL | 1 refills | Status: DC
Start: 2022-01-24 — End: 2022-10-26
  Filled 2022-01-24: qty 60, 30d supply, fill #0
  Filled 2022-06-19 – 2022-06-20 (×2): qty 60, 30d supply, fill #1

## 2022-01-24 MED ORDER — METFORMIN HCL 1000 MG PO TABS
1000.0000 mg | ORAL_TABLET | Freq: Two times a day (BID) | ORAL | 1 refills | Status: DC
  Filled 2022-01-24: qty 60, 30d supply, fill #0
  Filled 2022-06-19: qty 60, 30d supply, fill #1

## 2022-01-24 MED ORDER — OXYCODONE-ACETAMINOPHEN 5-325 MG PO TABS
2.0000 | ORAL_TABLET | ORAL | 0 refills | Status: DC | PRN
  Filled 2022-01-24: qty 30, 3d supply, fill #0

## 2022-01-24 NOTE — Discharge Summary (Signed)
? ?  Postpartum Discharge Summary ? ?Date of Service updated 01/24/2022 ? ? ?   ?Patient Name: Joann Meyers ?DOB: September 21, 1986 ?MRN: [id number] ? ?Date of admission: 12/08/2021 ?Delivery date:01/22/2022  ?Delivering provider: Verita Schneiders A  ?Date of discharge: 01/24/2022 ? ?Admitting diagnosis: Premature rupture of membranes [O42.90] ?Intrauterine pregnancy: [redacted]w[redacted]d    ?Secondary diagnosis:  Principal Problem: ?  Preterm premature rupture of membranes (PPROM) at 18 weeks, delivered at 29 weeks ?Active Problems: ?  History of TIA (transient ischemic attack) and stroke ?  Supervision of high risk pregnancy, antepartum ?  Preexisting diabetes complicating pregnancy, antepartum ?  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HCC) ?  Chronic hypertension affecting pregnancy ?  Obesity in pregnancy, antepartum ?  Rubella non-immune status, antepartum ?  Benign gestational thrombocytopenia in second trimester ([facility] ?  Postoperative anemia due to acute blood loss ?  History of severe preeclampsia, prior pregnancy, currently pregnant ?  Previous cesarean delivery, antepartum ?  Anemia affecting pregnancy in second trimester ?  Placenta previa, posterior ?  Abnormal genetic test during pregnancy ?  Malpresentation before onset of labor ?  Fetal growth restriction antepartum ?  Constipation ?  S/P repeat cesarean section ?  Placental abruption, delivered ? ?Additional problems: none    ?Discharge diagnosis: Preterm Pregnancy Delivered                                              ?Post partum procedures: none ?Augmentation: N/A ?Complications: Placental Abruption ? ?Hospital course: Onset of Labor With Unplanned C/S   ?36y.o. yo G2P0202 at 265w3das admitted for history of premature rupture of membranes at 18 weeks to receive antbiotics and betamethasone with plan to remain until delivery on 12/08/2021. She was monitored daily and fetal growth was followed and her diabetes was managed. Her CHTN was controlled with labetalol. The patient went for  cesarean section due to  onset of pain and bleeding with diagnosis of placental abruption performed by Dr. AnHarolyn Rutherford Delivery details as follows: ?Membrane Rupture Time/Date:  ,11/09/2021   ?Delivery Method:C-Section, Low Transverse  ?Details of operation can be found in separate operative note. Patient had an uncomplicated postpartum course.  She is ambulating,tolerating a regular diet, passing flatus, and urinating well.  Patient is discharged home in stable condition 01/24/22. ? ?Newborn Data: ?Birth date:01/22/2022  ?Birth time:9:11 PM  ?Gender:Female  ?Living status:Living  ?Apgars:6 ,8  ?Weight:1280 g  ? ?Magnesium Sulfate received: Yes: Neuroprotection ?BMZ received: Yes ?Rhophylac:N/A ?MMR:No ?T-DaP:Given prenatally ?Flu: No ?Transfusion:No ? ?Physical exam  ?Vitals:  ? 01/23/22 2025 01/24/22 0021 01/24/22 0319 01/24/22 073220?BP: (!) 102/48 (!) 92/31 (!) 106/55 (!) 123/57  ?Pulse: 74 85 83 86  ?Resp: '18 18 16 16  ' ?Temp: 97.9 ?F (36.6 ?C) 98.1 ?F (36.7 ?C) 97.9 ?F (36.6 ?C) 97.8 ?F (36.6 ?C)  ?TempSrc: Oral Oral Oral Oral  ?SpO2: 99% 95% 98% 98%  ?Weight:      ?Height:      ? ?General: alert, cooperative, and no distress ?Lochia: appropriate ?Uterine Fundus: firm ?Incision: prevena in place operating normally ?DVT Evaluation: No evidence of DVT seen on physical exam. ?Labs: ?Lab Results  ?Component Value Date  ? WBC 8.4 01/23/2022  ? HGB 7.5 (L) 01/23/2022  ? HCT 22.1 (L) 01/23/2022  ? MCV 84.7 01/23/2022  ? PLT 136 (L) 01/23/2022  ? ? ?  Latest Ref Rng & Units 01/23/2022  ?  4:21 AM  ?CMP  ?Glucose 70 - 99 mg/dL 158    ?BUN 6 - 20 mg/dL 12    ?Creatinine 0.44 - 1.00 mg/dL 0.61    ?Sodium 135 - 145 mmol/L 137    ?Potassium 3.5 - 5.1 mmol/L 3.9    ?Chloride 98 - 111 mmol/L 110    ?CO2 22 - 32 mmol/L 19    ?Calcium 8.9 - 10.3 mg/dL 8.3    ?Total Protein 6.5 - 8.1 g/dL 5.4    ?Total Bilirubin 0.3 - 1.2 mg/dL 0.8    ?Alkaline Phos 38 - 126 U/L 53    ?AST 15 - 41 U/L 26    ?ALT 0 - 44 U/L 31    ? ?Edinburgh Score: ? ?   01/24/2022  ? 10:25 AM  ?Flavia Shipper Postnatal Depression Scale Screening Tool  ?I have been able to laugh and see the funny side of things. 3  ?I have looked forward with enjoyment to things. 3  ?I have blamed myself unnecessarily when things went wrong. 0  ?I have been anxious or worried for no good reason. 3  ?I have felt scared or panicky for no good reason. 0  ?Things have been getting on top of me. 0  ?I have been so unhappy that I have had difficulty sleeping. 0  ?I have felt sad or miserable. 0  ?I have been so unhappy that I have been crying. 0  ?The thought of harming myself has occurred to me. 0  ?Edinburgh Postnatal Depression Scale Total 9  ? ? ? ?After visit meds:  ?Allergies as of 01/24/2022   ? ?   Reactions  ? Fish-derived Products Shortness Of Breath  ? Morphine And Related Anaphylaxis  ? Shellfish Allergy Anaphylaxis  ? "Swell up and can't breath"  ? Geralyn Flash [fish Allergy] Anaphylaxis  ? "couldn't breath"  ? Feraheme [ferumoxytol] Other (See Comments)  ? Hx of reaction- myalgia legs and lower back during infusion. No reaction to oral iron.  ? Latex Dermatitis  ? Pork-derived Products   ? No reaction, religious preference  ? ?  ? ?  ?Medication List  ?  ? ?STOP taking these medications   ? ?Accu-Chek Guide w/Device Kit ?  ?Accu-Chek Softclix Lancets lancets ?  ?aspirin EC 81 MG tablet ?  ?benzonatate 100 MG capsule ?Commonly known as: Best boy ?  ?Blood Pressure Kit Devi ?  ?famotidine 20 MG tablet ?Commonly known as: PEPCID ?  ?ferrous sulfate 325 (65 FE) MG tablet ?Commonly known as: FerrouSul ?  ?fluticasone 50 MCG/ACT nasal spray ?Commonly known as: FLONASE ?  ?FreeStyle Libre 2 Sensor Misc ?  ?glucose blood test strip ?  ?insulin aspart 100 UNIT/ML FlexPen ?Commonly known as: NOVOLOG ?  ?insulin glargine 100 UNIT/ML injection ?Commonly known as: Lantus ?  ?INSULIN SYRINGE .3CC/29GX1/2" 29G X 1/2" 0.3 ML Misc ?  ?NIFEdipine 30 MG 24 hr tablet ?Commonly known as: PROCARDIA-XL/NIFEDICAL-XL ?   ?ondansetron 4 MG disintegrating tablet ?Commonly known as: ZOFRAN-ODT ?  ?Pen Needles 32G X 5 MM Misc ?  ? ?  ? ?TAKE these medications   ? ?gabapentin 300 MG capsule ?Commonly known as: NEURONTIN ?Take 1 capsule (300 mg total) by mouth 2 (two) times daily. ?  ?ibuprofen 600 MG tablet ?Commonly known as: ADVIL ?Take 1 tablet (600 mg total) by mouth every 6 (six) hours. ?  ?labetalol 300 MG tablet ?Commonly known as: NORMODYNE ?Take 1  tablet (300 mg total) by mouth 2 (two) times daily. ?  ?metFORMIN 1000 MG tablet ?Commonly known as: GLUCOPHAGE ?Take 1 tablet (1,000 mg total) by mouth 2 (two) times daily at 8 am and 10 pm. ?What changed: when to take this ?  ?oxyCODONE-acetaminophen 5-325 MG tablet ?Commonly known as: PERCOCET/ROXICET ?Take 2 tablets by mouth every 4 (four) hours as needed for severe pain ((when tolerating fluids)). ?  ?PrePLUS 27-1 MG Tabs ?Take 1 tablet by mouth daily. ?  ? ?  ? ? ? ?Discharge home in stable condition ?Infant Feeding: Bottle and Breast ?Infant Disposition:NICU ?Discharge instruction: per After Visit Summary and Postpartum booklet. ?Activity: Advance as tolerated. Pelvic rest for 6 weeks.  ?Diet: carb modified diet ?Future Appointments:No future appointments. ?Follow up Visit: ? Follow-up Information   ? ? [facility] Follow up in 3 day(s).   ?Specialty: Obstetrics and Gynecology ?Why: For wound re-check, remove prevena ?Contact information: ?[address] ?[place] [address] ?[phone] ? ?  ?  ? ?  ?  ? ?  ? ? ? ?Please schedule this patient for a In person postpartum visit in 2-3 days with the following provider: Any provider. ?Additional Postpartum F/U:Incision check 2-3 days  ?High risk pregnancy complicated by:  h/o stroke & carotid a dissection ?Delivery mode:  C-Section, Low Transverse  ?Anticipated Birth Control:  Condoms ? ? ?01/24/2022 ?Emeterio Reeve, MD ? ? ? ?

## 2022-01-24 NOTE — Lactation Note (Signed)
This note was copied from a baby's chart. ? ?NICU Lactation Consultation Note ? ?Patient Name: Joann Meyers ?Today's Date: 01/24/2022 ?Age:35 hours ? ? ?Subjective ?Reason for consult: Follow-up assessment ?Mother initiated pumping this morning. She endorses phone call from [facility] regarding pump use and will f/u with [facility] representative. Mother denies difficulty or pain with pumping.  ? ?Objective ?Maternal data: ?G2P0202  ?C-Section, Low Transverse ?Significant Breast History:: no changes in pregnancy ? ?Current breast feeding challenges:: NICU; Preterm ? ?Previous breastfeeding challenges?: Other (Comment) (Previous child was preterm) ? ?Does the patient have breastfeeding experience prior to this delivery?: Yes ? ?Pumping frequency: q3 today ?Pumped volume: 0 mL (has not pumped yet) ? ?Risk factor for low milk supply:: delay of pumping initiation ? ? ?[place] Program: Yes ?[place] Referral Sent?: Yes ?Pump:  (Needs WIC pump) ? ?Assessment ?Infant: ?Feeding Status: NPO ? ? ?Intervention/Plan ?Interventions: Education ? ?Tools: Pump ?Pump Education: Setup, frequency, and cleaning ? ?Plan: ?Consult Status: Follow-up (verify WIC pump receipt) ? ?NICU Follow-up type: Verify onset of copious milk; Verify absence of engorgement; Maternal D/C visit; Weekly NICU follow up ? ?Mother to f/u with [facility] for loaner pump ?Mother to pump q3h ? ?[name] ?01/24/2022, 10:22 AM ?

## 2022-01-24 NOTE — Progress Notes (Signed)
Pt instructed to call provider's office to remove wound vac. Home care instructions given. ?

## 2022-01-26 ENCOUNTER — Encounter (HOSPITAL_COMMUNITY): Payer: Self-pay | Admitting: Obstetrics & Gynecology

## 2022-01-26 ENCOUNTER — Inpatient Hospital Stay (HOSPITAL_COMMUNITY)
Admission: AD | Admit: 2022-01-26 | Discharge: 2022-01-26 | Disposition: A | Discharge status: Home / Self Care | Payer: Medicaid Other | Attending: Obstetrics & Gynecology | Admitting: Obstetrics & Gynecology

## 2022-01-26 ENCOUNTER — Ambulatory Visit (INDEPENDENT_AMBULATORY_CARE_PROVIDER_SITE_OTHER): Payer: Medicaid Other

## 2022-01-26 ENCOUNTER — Other Ambulatory Visit: Payer: Self-pay | Admitting: Certified Nurse Midwife

## 2022-01-26 VITALS — BP 122/79 | HR 87 | Temp 97.9°F

## 2022-01-26 DIAGNOSIS — E119 Type 2 diabetes mellitus without complications: Secondary | ICD-10-CM | POA: Diagnosis not present

## 2022-01-26 DIAGNOSIS — R519 Headache, unspecified: Secondary | ICD-10-CM | POA: Diagnosis not present

## 2022-01-26 DIAGNOSIS — O9081 Anemia of the puerperium: Secondary | ICD-10-CM | POA: Insufficient documentation

## 2022-01-26 DIAGNOSIS — R6883 Chills (without fever): Secondary | ICD-10-CM | POA: Insufficient documentation

## 2022-01-26 DIAGNOSIS — G8918 Other acute postprocedural pain: Secondary | ICD-10-CM | POA: Insufficient documentation

## 2022-01-26 DIAGNOSIS — I10 Essential (primary) hypertension: Secondary | ICD-10-CM | POA: Insufficient documentation

## 2022-01-26 DIAGNOSIS — R110 Nausea: Secondary | ICD-10-CM | POA: Diagnosis not present

## 2022-01-26 DIAGNOSIS — Z5189 Encounter for other specified aftercare: Secondary | ICD-10-CM

## 2022-01-26 DIAGNOSIS — O9089 Other complications of the puerperium, not elsewhere classified: Secondary | ICD-10-CM | POA: Insufficient documentation

## 2022-01-26 DIAGNOSIS — Z8673 Personal history of transient ischemic attack (TIA), and cerebral infarction without residual deficits: Secondary | ICD-10-CM | POA: Insufficient documentation

## 2022-01-26 LAB — COMPREHENSIVE METABOLIC PANEL
ALT: 44 U/L (ref 0–44)
AST: 36 U/L (ref 15–41)
Albumin: 2.8 g/dL — ABNORMAL LOW (ref 3.5–5.0)
Alkaline Phosphatase: 55 U/L (ref 38–126)
Anion gap: 6 (ref 5–15)
BUN: 8 mg/dL (ref 6–20)
CO2: 25 mmol/L (ref 22–32)
Calcium: 8.9 mg/dL (ref 8.9–10.3)
Chloride: 108 mmol/L (ref 98–111)
Creatinine, Ser: 0.53 mg/dL (ref 0.44–1.00)
GFR, Estimated: >60 mL/min (ref >60)
Glucose, Bld: 120 mg/dL — ABNORMAL HIGH (ref 70–99)
Potassium: 4.1 mmol/L (ref 3.5–5.1)
Sodium: 139 mmol/L (ref 135–145)
Total Bilirubin: 0.4 mg/dL (ref 0.3–1.2)
Total Protein: 5.7 g/dL — ABNORMAL LOW (ref 6.5–8.1)

## 2022-01-26 LAB — CBC
HCT: 22.2 % — ABNORMAL LOW (ref 36.0–46.0)
Hemoglobin: 7 g/dL — ABNORMAL LOW (ref 12.0–15.0)
MCH: 27.8 pg (ref 26.0–34.0)
MCHC: 31.5 g/dL (ref 30.0–36.0)
MCV: 88.1 fL (ref 80.0–100.0)
Platelets: 139 10*3/uL — ABNORMAL LOW (ref 150–400)
RBC: 2.52 MIL/uL — ABNORMAL LOW (ref 3.87–5.11)
RDW: 18.9 % — ABNORMAL HIGH (ref 11.5–15.5)
WBC: 5.4 10*3/uL (ref 4.0–10.5)
nRBC: 0.6 % — ABNORMAL HIGH (ref 0.0–0.2)

## 2022-01-26 LAB — URINALYSIS, MICROSCOPIC (REFLEX)

## 2022-01-26 LAB — URINALYSIS, ROUTINE W REFLEX MICROSCOPIC
Bilirubin Urine: NEGATIVE
Glucose, UA: NEGATIVE mg/dL
Ketones, ur: NEGATIVE mg/dL
Leukocytes,Ua: NEGATIVE
Nitrite: NEGATIVE
Protein, ur: NEGATIVE mg/dL
Specific Gravity, Urine: 1.02 (ref 1.005–1.030)
pH: 5.5 (ref 5.0–8.0)

## 2022-01-26 MED ORDER — FERROUS SULFATE 325 (65 FE) MG PO TABS: 325.0000 mg | ORAL_TABLET | ORAL | 1 refills | Status: DC

## 2022-01-26 MED ORDER — IBUPROFEN 600 MG PO TABS
600.0000 mg | ORAL_TABLET | Freq: Four times a day (QID) | ORAL | Status: DC | PRN
  Administered 2022-01-26: 600 mg via ORAL
  Filled 2022-01-26: qty 1

## 2022-01-26 MED ORDER — LABETALOL HCL 100 MG PO TABS
300.0000 mg | ORAL_TABLET | Freq: Two times a day (BID) | ORAL | Status: DC
  Administered 2022-01-26: 300 mg via ORAL
  Filled 2022-01-26: qty 3

## 2022-01-26 MED ORDER — OXYCODONE-ACETAMINOPHEN 5-325 MG PO TABS
1.0000 | ORAL_TABLET | Freq: Four times a day (QID) | ORAL | Status: DC | PRN
  Administered 2022-01-26: 2 via ORAL
  Filled 2022-01-26: qty 2
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
## 2022-01-26 NOTE — Progress Notes (Signed)
Here today for Provena check s/p c-section on 01/22/22. Provena is intact and draining. Pt to return Monday for removal.  ? ?Patient is tearful and expresses that pain is severe in right lower abdomen. Pt is holding stomach while walking and changing position. Pain increases with palpation. Pt reports taking 2 tablets of prescribed Percocet prior to coming to appt with no relief. Pt reports daily bowel movement that is hard to pass. Describes stool as black in color. Sees some blood during bowel movement but is unsure if from vagina or rectum.  ? ?Vitals WNL. Reviewed with Damita Dunnings, MD who states patient should present to MAU for further evaluation. Reviewed recommendation with patient who is agreeable. Pt's mother will drive patient to MAU. Report given to Dione Plover, MD.  ? Apolonio Schneiders RN ?01/26/22 ?
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
## 2022-01-26 NOTE — MAU Note (Signed)
Joann Meyers is a 36 y.o.  here in MAU reporting: c/s on 4/9. Sent from clinic, after incision check.  Pt has a wound vac.  Having a lot of LLQ pain.  started prior to d/c.  ? ?Onset of complaint: d/c'd on Tues  ?Pain score: 10 ?Vitals:  ? 01/26/22 1120  ?BP: 121/70  ?Pulse: 87  ?Resp: 17  ?Temp: 98.6 ?F (37 ?C)  ?SpO2: 97%  ?   ? ?Lab orders placed from triage:  none ?

## 2022-01-27 ENCOUNTER — Ambulatory Visit: Payer: Medicaid Other

## 2022-01-27 ENCOUNTER — Telehealth: Payer: Self-pay | Admitting: *Deleted

## 2022-01-27 NOTE — Telephone Encounter (Signed)
-----   Message from Julianne Handler, [place] sent at 01/26/2022  5:07 PM EDT ----- ?Regarding: Venofer ?Pt needs second Venofer infusion next week.  Order placed. ? ? ? ? ? ?

## 2022-01-27 NOTE — Telephone Encounter (Signed)
I called [facility] center near [facility] [phone] and left message we need to scheduled 2nd venofer infusion, orders are in, please call us to let us know you got message and scheduled it.  ?I called Jamiya and informed her doctor ordered another iv iron infusion and that either they would call her with appointment or we will. She voices understanding. ?Staci Acosta ?

## 2022-01-27 NOTE — Telephone Encounter (Signed)
I called [facility] center and scheduled patient for first available appointment of 02/14/22 at 10:30.   ?I called [facility] at [phone] and left message to see if they can schedule her sooner since her hgb was 7.0. ?If they schedule her sooner, need to call and cancel at [facility] center and need to notify patient of appointment. ?Staci Acosta ?

## 2022-01-31 ENCOUNTER — Ambulatory Visit: Payer: Self-pay

## 2022-01-31 NOTE — Lactation Note (Signed)
This note was copied from a baby's chart. ?Telephone call ? ?Patient Name: Joann Meyers ?Today's Date: 01/31/2022 ?Reason for consult: Follow-up assessment;Other (Comment);Maternal endocrine disorder;NICU baby;Preterm <34wks;Infant < 6lbs (AMA, Telephone call) ?Age:36 years ? ?Attempted to visit with mom multiple times the last 3 days but she hasn't come to the hospital, she has another baby at home (27 months old). Spoke to Joann Meyers over the phone to check on pumping status and she voiced that pumping is going well but that she's only pumping every 3 hours during the day time, she'll go all night without pumping. Explained to her the importance of consistently draining her breasts every 3 hours to protect her supply, she said pumping at night is challenging due to her child at home. Revised some strategies to increase her supply such as power pumping. She also reported getting her WIC pump on 01/27/22. ? ?Feeding ?Mother's Current Feeding Choice: Breast Milk and Donor Milk ? ?Lactation Tools Discussed/Used ?Tools: Pump ?Breast pump type: Double-Electric Breast Pump ?Pump Education: Setup, frequency, and cleaning;Milk Storage ?Reason for Pumping: pre-term in NICU ?Pumping frequency: 5-6 times/24 hours ?Pumped volume: 60 mL (60-120 ml) ? ?Interventions ?Interventions: Education ? ?Plan of care ?Encouraged mom to continue pumping consistently every 3 hours, ideally 8 pumping sessions/24 hours ?She'll start power pumping in the AM or just pump for longer ? ?All questions and concerns answered, Lactation will plan to F/U with Joann Meyers the next time she comes to visit baby, asked her to let baby's nurse know to page NICU lactation.  ? ?Discharge ?Pump: DEBP;Personal (WIC pump) ? ?Consult Status ?Consult Status: Follow-up ?Date: 01/31/22 ?Follow-up type: In-patient ? ? ?Joann Meyers S Joann Meyers ?01/31/2022, 4:17 PM ? ? ? ?

## 2022-02-01 ENCOUNTER — Other Ambulatory Visit: Payer: Self-pay

## 2022-02-01 ENCOUNTER — Inpatient Hospital Stay (HOSPITAL_COMMUNITY): Payer: Medicaid Other

## 2022-02-01 ENCOUNTER — Observation Stay (HOSPITAL_COMMUNITY)
Admission: AD | Admit: 2022-02-01 | Discharge: 2022-02-02 | Disposition: A | Discharge status: Home / Self Care | Payer: Medicaid Other | Attending: Obstetrics and Gynecology | Admitting: Obstetrics and Gynecology

## 2022-02-01 ENCOUNTER — Encounter (HOSPITAL_COMMUNITY): Payer: Self-pay | Admitting: Obstetrics and Gynecology

## 2022-02-01 DIAGNOSIS — D649 Anemia, unspecified: Secondary | ICD-10-CM | POA: Diagnosis present

## 2022-02-01 DIAGNOSIS — Z8673 Personal history of transient ischemic attack (TIA), and cerebral infarction without residual deficits: Secondary | ICD-10-CM | POA: Insufficient documentation

## 2022-02-01 DIAGNOSIS — E1165 Type 2 diabetes mellitus with hyperglycemia: Secondary | ICD-10-CM | POA: Insufficient documentation

## 2022-02-01 DIAGNOSIS — O165 Unspecified maternal hypertension, complicating the puerperium: Secondary | ICD-10-CM | POA: Diagnosis not present

## 2022-02-01 DIAGNOSIS — O9903 Anemia complicating the puerperium: Principal | ICD-10-CM | POA: Insufficient documentation

## 2022-02-01 DIAGNOSIS — O24319 Unspecified pre-existing diabetes mellitus in pregnancy, unspecified trimester: Secondary | ICD-10-CM

## 2022-02-01 DIAGNOSIS — R109 Unspecified abdominal pain: Secondary | ICD-10-CM | POA: Insufficient documentation

## 2022-02-01 DIAGNOSIS — Z91040 Latex allergy status: Secondary | ICD-10-CM | POA: Insufficient documentation

## 2022-02-01 DIAGNOSIS — Z79899 Other long term (current) drug therapy: Secondary | ICD-10-CM | POA: Insufficient documentation

## 2022-02-01 DIAGNOSIS — Z98891 History of uterine scar from previous surgery: Secondary | ICD-10-CM

## 2022-02-01 DIAGNOSIS — O2413 Pre-existing diabetes mellitus, type 2, in the puerperium: Secondary | ICD-10-CM | POA: Insufficient documentation

## 2022-02-01 DIAGNOSIS — Z7984 Long term (current) use of oral hypoglycemic drugs: Secondary | ICD-10-CM | POA: Insufficient documentation

## 2022-02-01 LAB — COMPREHENSIVE METABOLIC PANEL
ALT: 24 U/L (ref 0–44)
AST: 34 U/L (ref 15–41)
Albumin: 3.1 g/dL — ABNORMAL LOW (ref 3.5–5.0)
Alkaline Phosphatase: 62 U/L (ref 38–126)
Anion gap: 6 (ref 5–15)
BUN: 8 mg/dL (ref 6–20)
CO2: 23 mmol/L (ref 22–32)
Calcium: 8.4 mg/dL — ABNORMAL LOW (ref 8.9–10.3)
Chloride: 111 mmol/L (ref 98–111)
Creatinine, Ser: 0.6 mg/dL (ref 0.44–1.00)
GFR, Estimated: >60 mL/min (ref >60)
Glucose, Bld: 195 mg/dL — ABNORMAL HIGH (ref 70–99)
Potassium: 4.3 mmol/L (ref 3.5–5.1)
Sodium: 140 mmol/L (ref 135–145)
Total Bilirubin: 0.9 mg/dL (ref 0.3–1.2)
Total Protein: 5.7 g/dL — ABNORMAL LOW (ref 6.5–8.1)

## 2022-02-01 LAB — CBC
HCT: 23.7 % — ABNORMAL LOW (ref 36.0–46.0)
Hemoglobin: 7.5 g/dL — ABNORMAL LOW (ref 12.0–15.0)
MCH: 27.5 pg (ref 26.0–34.0)
MCHC: 31.6 g/dL (ref 30.0–36.0)
MCV: 86.8 fL (ref 80.0–100.0)
Platelets: 176 10*3/uL (ref 150–400)
RBC: 2.73 MIL/uL — ABNORMAL LOW (ref 3.87–5.11)
RDW: 17.4 % — ABNORMAL HIGH (ref 11.5–15.5)
WBC: 4.8 10*3/uL (ref 4.0–10.5)
nRBC: 0.4 % — ABNORMAL HIGH (ref 0.0–0.2)

## 2022-02-01 LAB — PREPARE RBC (CROSSMATCH)

## 2022-02-01 LAB — GLUCOSE, CAPILLARY
Glucose-Capillary: 177 mg/dL — ABNORMAL HIGH (ref 70–99)
Glucose-Capillary: 158 mg/dL — ABNORMAL HIGH (ref 70–99)
Glucose-Capillary: 146 mg/dL — ABNORMAL HIGH (ref 70–99)
Glucose-Capillary: 175 mg/dL — ABNORMAL HIGH (ref 70–99)

## 2022-02-01 LAB — TROPONIN I (HIGH SENSITIVITY): Troponin I (High Sensitivity): 6 ng/L (ref <18)

## 2022-02-01 LAB — BRAIN NATRIURETIC PEPTIDE: B Natriuretic Peptide: 157.6 pg/mL — ABNORMAL HIGH (ref 0.0–100.0)

## 2022-02-01 MED ORDER — IOHEXOL 300 MG/ML  SOLN
100.0000 mL | Freq: Once | INTRAMUSCULAR | Status: AC | PRN
  Administered 2022-02-01: 100 mL via INTRAVENOUS

## 2022-02-01 MED ORDER — METFORMIN HCL 500 MG PO TABS: 1000.0000 mg | ORAL_TABLET | Freq: Two times a day (BID) | ORAL | Status: DC

## 2022-02-01 MED ORDER — DIBUCAINE (PERIANAL) 1 % EX OINT: 1.0000 "application " | TOPICAL_OINTMENT | CUTANEOUS | Status: DC | PRN

## 2022-02-01 MED ORDER — LABETALOL HCL 5 MG/ML IV SOLN: 40.0000 mg | INTRAVENOUS | Status: DC | PRN

## 2022-02-01 MED ORDER — SODIUM CHLORIDE 0.9% IV SOLUTION: Freq: Once | INTRAVENOUS | Status: AC

## 2022-02-01 MED ORDER — DIPHENHYDRAMINE HCL 25 MG PO CAPS: 25.0000 mg | ORAL_CAPSULE | Freq: Four times a day (QID) | ORAL | Status: DC | PRN

## 2022-02-01 MED ORDER — OXYCODONE-ACETAMINOPHEN 5-325 MG PO TABS
2.0000 | ORAL_TABLET | Freq: Once | ORAL | Status: AC
  Administered 2022-02-01: 2 via ORAL
  Filled 2022-02-01: qty 2

## 2022-02-01 MED ORDER — LABETALOL HCL 100 MG PO TABS
300.0000 mg | ORAL_TABLET | Freq: Once | ORAL | Status: DC
Start: 2022-02-01 — End: 2022-02-01

## 2022-02-01 MED ORDER — COCONUT OIL OIL: 1.0000 "application " | TOPICAL_OIL | Status: DC | PRN

## 2022-02-01 MED ORDER — MENTHOL 3 MG MT LOZG: 1.0000 | LOZENGE | OROMUCOSAL | Status: DC | PRN

## 2022-02-01 MED ORDER — INSULIN ASPART 100 UNIT/ML IJ SOLN: 0.0000 [IU] | Freq: Three times a day (TID) | INTRAMUSCULAR | Status: DC

## 2022-02-01 MED ORDER — OXYCODONE HCL 5 MG PO TABS: 5.0000 mg | ORAL_TABLET | ORAL | Status: DC | PRN

## 2022-02-01 MED ORDER — SIMETHICONE 80 MG PO CHEW: 80.0000 mg | CHEWABLE_TABLET | ORAL | Status: DC | PRN

## 2022-02-01 MED ORDER — LACTATED RINGERS IV SOLN: INTRAVENOUS | Status: DC

## 2022-02-01 MED ORDER — LABETALOL HCL 5 MG/ML IV SOLN
20.0000 mg | INTRAVENOUS | Status: DC | PRN
Start: 2022-02-01 — End: 2022-02-01

## 2022-02-01 MED ORDER — WITCH HAZEL-GLYCERIN EX PADS: 1.0000 "application " | MEDICATED_PAD | CUTANEOUS | Status: DC | PRN

## 2022-02-01 MED ORDER — FUROSEMIDE 10 MG/ML IJ SOLN
20.0000 mg | Freq: Once | INTRAMUSCULAR | Status: AC
  Administered 2022-02-01: 20 mg via INTRAVENOUS
  Filled 2022-02-01: qty 2

## 2022-02-01 MED ORDER — NIFEDIPINE 10 MG PO CAPS
20.0000 mg | ORAL_CAPSULE | ORAL | Status: DC | PRN
  Filled 2022-02-01: qty 2

## 2022-02-01 MED ORDER — INSULIN ASPART 100 UNIT/ML IJ SOLN
0.0000 [IU] | Freq: Three times a day (TID) | INTRAMUSCULAR | Status: DC
  Administered 2022-02-01: 2 [IU] via SUBCUTANEOUS
  Administered 2022-02-02: 6 [IU] via SUBCUTANEOUS
  Administered 2022-02-02: 2 [IU] via SUBCUTANEOUS

## 2022-02-01 MED ORDER — SENNOSIDES-DOCUSATE SODIUM 8.6-50 MG PO TABS: 2.0000 | ORAL_TABLET | Freq: Every evening | ORAL | Status: DC | PRN

## 2022-02-01 MED ORDER — CEFADROXIL 500 MG PO CAPS
500.0000 mg | ORAL_CAPSULE | Freq: Two times a day (BID) | ORAL | Status: DC
  Administered 2022-02-01 – 2022-02-02 (×2): 500 mg via ORAL
  Filled 2022-02-01 (×3): qty 1

## 2022-02-01 MED ORDER — IBUPROFEN 600 MG PO TABS
600.0000 mg | ORAL_TABLET | Freq: Four times a day (QID) | ORAL | Status: DC | PRN
  Administered 2022-02-01: 600 mg via ORAL
  Filled 2022-02-01: qty 1

## 2022-02-01 MED ORDER — INSULIN ASPART 100 UNIT/ML IJ SOLN
2.0000 [IU] | Freq: Once | INTRAMUSCULAR | Status: AC
  Administered 2022-02-01: 2 [IU] via SUBCUTANEOUS

## 2022-02-01 MED ORDER — NIFEDIPINE 10 MG PO CAPS
20.0000 mg | ORAL_CAPSULE | ORAL | Status: DC | PRN
  Administered 2022-02-01: 20 mg via ORAL

## 2022-02-01 MED ORDER — POLYETHYLENE GLYCOL 3350 17 G PO PACK
17.0000 g | PACK | Freq: Every day | ORAL | Status: DC
  Administered 2022-02-02: 17 g via ORAL
  Filled 2022-02-01: qty 1

## 2022-02-01 MED ORDER — NIFEDIPINE 10 MG PO CAPS
10.0000 mg | ORAL_CAPSULE | ORAL | Status: DC | PRN
  Administered 2022-02-01: 10 mg via ORAL
  Filled 2022-02-01: qty 1

## 2022-02-01 MED ORDER — TETANUS-DIPHTH-ACELL PERTUSSIS 5-2.5-18.5 LF-MCG/0.5 IM SUSY
0.5000 mL | PREFILLED_SYRINGE | Freq: Once | INTRAMUSCULAR | Status: DC
Start: 2022-02-02 — End: 2022-02-01
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
## 2022-02-01 NOTE — Lactation Note (Signed)
This note was copied from a baby's chart. ?\ ?NICU Lactation Consultation Note ? ?Patient Name: Joann Meyers ?Today's Date: 02/01/2022 ?Age:35 days ? ? ?Subjective ?Reason for consult: NICU baby; Follow-up assessment ? ?Lactation followed up with Joann Meyers on MAU. She reports illness, and needed to pump. I helped her pump her breast milk and requested storage labels from NICU. I provided DEBP kit, breast pads, and storage bottles. ? ?She pumped at 1600. MAU RN states that she will take the 75 cc's up to NICU. I recommended that Joann Meyers label all pumped milk in the next 24 hours with date and time (not to discard yet). I called NICU pharmacy to let them know about CT. I was unable to ascertain what kind of contrast would be used, but I advised Joann Meyers that it's not likely that she will need to discard her pumped milk. For now, I suggested that she label milk with date and time of pump for follow up tomorrow. When we know what kind of medications she has been given, we may then determine the safety of her pumped breast milk to provide to NICU. The milk pumped at 1800 should be safe to take to NICU. ? ?Objective ?Infant data: ?Mother's Current Feeding Choice: Breast Milk and Donor Milk ? ?Infant feeding assessment ?No data recorded ? ?  ?Maternal data: ?[id number]  ?C-Section, Low Transverse ?Pumping frequency: 5-6 times/24 hours ?Pumped volume: 75 mL ? ? ?WIC Program: Yes ?WIC Referral Sent?: Yes ?Pump: DEBP, Personal (WIC pump) ? ?Maternal: ?Milk volume: Normal ? ? ?Intervention/Plan ?Interventions: DEBP ? ?Tools: Pump ?Pump Education: Setup, frequency, and cleaning ? ?Plan: ?Consult Status: Follow-up ? ?NICU Follow-up type: Weekly NICU follow up ? ? ? ?Heather Lynn ?02/01/2022, 6:24 PM ?

## 2022-02-01 NOTE — Telephone Encounter (Signed)
Pt informed that her Venofer is scheduled for 02/14/22 at . She can get her appt information from [place].  Pt verbalized understanding with no further questions.  ? ?Stylianos Stradling,RN  ?02/01/22 ?

## 2022-02-01 NOTE — Progress Notes (Signed)
OB Note ?Will start duricef for possible non purulent skin infection or seroma and easy of future compliance ? ? ?

## 2022-02-01 NOTE — MAU Note (Signed)
CXR

## 2022-02-01 NOTE — MAU Note (Signed)
Unable to obtain iv access with 3 attempts. Iv consult in ?

## 2022-02-01 NOTE — MAU Note (Signed)
.  Joann Meyers is a 36 y.o. at Unknown here in MAU reporting  pain in her incision site since she was discharged from the hospital (s/p c-section 04/09). Also reports an odor and some discharge from the incision. Reports temp of 105.3 (verified that it was actually 105.3 and not 100.5) at home yesterday and reports she took tylenol and ibuprofen. Also reports a pounding headache since yesterday ? ?Onset of complaint:01/24/2022 ?Pain score: headache 5/10 ?Vitals:  ? 02/01/22 1624  ?BP: (!) 168/78  ?Pulse: 83  ?Resp: 16  ?Temp: 99.1 ?F (37.3 ?C)  ?SpO2: 98%  ?   ? ?Lab orders placed from triage urine:   ? ?

## 2022-02-01 NOTE — MAU Note (Signed)
B/p cuff off for pt to pump, lactation at bedside.  ?

## 2022-02-01 NOTE — MAU Note (Signed)
Pt is pumping due to need to pump and dump for 24 hours after the CT scan. Per provider we can remove the b/p cuff until pt is finished pumping. Baby is in NICU ?

## 2022-02-02 ENCOUNTER — Other Ambulatory Visit (HOSPITAL_COMMUNITY): Payer: Self-pay

## 2022-02-02 ENCOUNTER — Ambulatory Visit: Payer: Medicaid Other

## 2022-02-02 ENCOUNTER — Inpatient Hospital Stay (HOSPITAL_BASED_OUTPATIENT_CLINIC_OR_DEPARTMENT_OTHER): Payer: Medicaid Other

## 2022-02-02 DIAGNOSIS — I340 Nonrheumatic mitral (valve) insufficiency: Secondary | ICD-10-CM

## 2022-02-02 DIAGNOSIS — I351 Nonrheumatic aortic (valve) insufficiency: Secondary | ICD-10-CM

## 2022-02-02 DIAGNOSIS — I361 Nonrheumatic tricuspid (valve) insufficiency: Secondary | ICD-10-CM | POA: Diagnosis not present

## 2022-02-02 LAB — TYPE AND SCREEN
ABO/RH(D): O POS
Antibody Screen: NEGATIVE
Unit division: 0

## 2022-02-02 LAB — COMPREHENSIVE METABOLIC PANEL
ALT: 24 U/L (ref 0–44)
AST: 18 U/L (ref 15–41)
Albumin: 3.1 g/dL — ABNORMAL LOW (ref 3.5–5.0)
Alkaline Phosphatase: 55 U/L (ref 38–126)
Anion gap: 8 (ref 5–15)
BUN: 9 mg/dL (ref 6–20)
CO2: 23 mmol/L (ref 22–32)
Calcium: 8.3 mg/dL — ABNORMAL LOW (ref 8.9–10.3)
Chloride: 106 mmol/L (ref 98–111)
Creatinine, Ser: 0.66 mg/dL (ref 0.44–1.00)
GFR, Estimated: >60 mL/min (ref >60)
Glucose, Bld: 126 mg/dL — ABNORMAL HIGH (ref 70–99)
Potassium: 3.4 mmol/L — ABNORMAL LOW (ref 3.5–5.1)
Sodium: 137 mmol/L (ref 135–145)
Total Bilirubin: 0.9 mg/dL (ref 0.3–1.2)
Total Protein: 5.6 g/dL — ABNORMAL LOW (ref 6.5–8.1)

## 2022-02-02 LAB — CBC
HCT: 26.1 % — ABNORMAL LOW (ref 36.0–46.0)
Hemoglobin: 8.7 g/dL — ABNORMAL LOW (ref 12.0–15.0)
MCH: 28.9 pg (ref 26.0–34.0)
MCHC: 33.3 g/dL (ref 30.0–36.0)
MCV: 86.7 fL (ref 80.0–100.0)
Platelets: 158 10*3/uL (ref 150–400)
RBC: 3.01 MIL/uL — ABNORMAL LOW (ref 3.87–5.11)
RDW: 16.9 % — ABNORMAL HIGH (ref 11.5–15.5)
WBC: 4.6 10*3/uL (ref 4.0–10.5)
nRBC: 0 % (ref 0.0–0.2)

## 2022-02-02 LAB — ECHOCARDIOGRAM COMPLETE
AV Vena cont: 0.2 cm
Area-P 1/2: 3.53 cm2
Calc EF: 55.5 %
P 1/2 time: 506 msec
S' Lateral: 3.5 cm
Single Plane A2C EF: 55 %
Single Plane A4C EF: 55.3 %

## 2022-02-02 LAB — GLUCOSE, CAPILLARY
Glucose-Capillary: 255 mg/dL — ABNORMAL HIGH (ref 70–99)
Glucose-Capillary: 164 mg/dL — ABNORMAL HIGH (ref 70–99)

## 2022-02-02 LAB — BPAM RBC
Blood Product Expiration Date: 202305262359
ISSUE DATE / TIME: 202304192216
Unit Type and Rh: 5100

## 2022-02-02 LAB — MAGNESIUM: Magnesium: 1.8 mg/dL (ref 1.7–2.4)

## 2022-02-02 MED ORDER — CEFADROXIL 500 MG PO CAPS
500.0000 mg | ORAL_CAPSULE | Freq: Two times a day (BID) | ORAL | 0 refills | Status: AC
  Filled 2022-02-02: qty 18, 9d supply, fill #0

## 2022-02-02 MED ORDER — INSULIN PEN NEEDLE 32G X 4 MM MISC
2 refills | Status: DC
  Filled 2022-02-02: qty 100, 30d supply, fill #0

## 2022-02-02 MED ORDER — POTASSIUM CHLORIDE CRYS ER 20 MEQ PO TBCR
20.0000 meq | EXTENDED_RELEASE_TABLET | Freq: Two times a day (BID) | ORAL | 0 refills | Status: DC
  Filled 2022-02-02: qty 4, 2d supply, fill #0

## 2022-02-02 MED ORDER — NOVOLOG FLEXPEN 100 UNIT/ML ~~LOC~~ SOPN
3.0000 [IU] | PEN_INJECTOR | Freq: Three times a day (TID) | SUBCUTANEOUS | 11 refills | Status: DC
  Filled 2022-02-02: qty 3, 33d supply, fill #0

## 2022-02-02 MED ORDER — OXYCODONE HCL 5 MG PO TABS
5.0000 mg | ORAL_TABLET | Freq: Four times a day (QID) | ORAL | 0 refills | Status: DC | PRN
  Filled 2022-02-02: qty 10, 3d supply, fill #0

## 2022-02-02 MED ORDER — LEVEMIR FLEXPEN 100 UNIT/ML ~~LOC~~ SOPN
12.0000 [IU] | PEN_INJECTOR | Freq: Every day | SUBCUTANEOUS | 11 refills | Status: DC
  Filled 2022-02-02: qty 3, 25d supply, fill #0

## 2022-02-02 NOTE — Discharge Summary (Signed)
Physician Discharge Summary  ?Patient ID: ?Joann Meyers ?MRN: [id number] ?DOB/AGE: 17-Sep-1986 36 y.o. ? ?Admit date: 02/01/2022 ?Discharge date: 02/02/2022 ? ?Admission Diagnoses:symptomatic anemia, elevated blood sugar ? ?Discharge Diagnoses:  ?Principal Problem: ?  Symptomatic anemia ? ? ?Discharged Condition: good ? ?Hospital Course: Pt admitted for symptomatic anemia.  Pt was s/p repeat LTCS on 4/9.  She was seen for abdominal pain along with elevated BP and elevated blood sugars.  It was felt that patient did have fluid overload.  Pt received lasix 20 mg IV x 1.  Pt seen on hospital day 2 and felt much better s/p her 1 unit of PRBC.  She stated her visual changes had gone away.  Discussed elevated blood sugars with the diabetic coordinator  and it was suggested she restart insulin.  Pt was discharged without incident and will have close follow up regarding her blood pressure, blood sugars and general status. ? ?Consults: None ? ?Significant Diagnostic Studies: cardiac graphics: Echocardiogram: WNL and stable from previous ? ?Treatments: antibiotics: duricef and blood transfusion 1 unit PRBC ? ?Discharge Exam: ?Blood pressure 138/63, pulse 83, temperature 98.4 ?F (36.9 ?C), temperature source Oral, resp. rate 17, height '5\' 2"'$  (1.575 m), weight 97.5 kg, SpO2 97 %, currently breastfeeding. ?General appearance: alert, cooperative, and no distress ?Head: Normocephalic, without obvious abnormality, atraumatic ?Resp: clear to auscultation bilaterally ?Cardio: regular rate and rhythm ?Extremities: Homans sign is negative, no sign of DVT ?Skin: Skin color, texture, turgor normal. No rashes or lesions ?Incision/Wound: wound clean dry and intact, no large defects noted, no granulation tissue detected ? ?Disposition: Discharge disposition: 01-Home or Self Care ? ? ? ? ? ? ?Discharge Instructions   ? ? Activity as tolerated   Complete by: As directed ?  ? Call MD for:  difficulty breathing, headache or visual disturbances    Complete by: As directed ?  ? Call MD for:  persistant dizziness or light-headedness   Complete by: As directed ?  ? Call MD for:  persistant nausea and vomiting   Complete by: As directed ?  ? Call MD for:  redness, tenderness, or signs of infection (pain, swelling, redness, odor or green/yellow discharge around incision site)   Complete by: As directed ?  ? Call MD for:  severe uncontrolled pain   Complete by: As directed ?  ? Call MD for:  temperature >100.4   Complete by: As directed ?  ? Diet Carb Modified   Complete by: As directed ?  ? Sexual activity   Complete by: As directed ?  ? Continue pelvic rest until seen for postpartum visit  ? ?  ? ?Allergies as of 02/02/2022   ? ?   Reactions  ? Fish-derived Products Shortness Of Breath  ? Morphine And Related Anaphylaxis  ? Shellfish Allergy Anaphylaxis  ? "Swell up and can't breath"  ? Geralyn Flash [fish Allergy] Anaphylaxis  ? "couldn't breath"  ? Feraheme [ferumoxytol] Other (See Comments)  ? Hx of reaction- myalgia legs and lower back during infusion. No reaction to oral iron.  ? Latex Dermatitis  ? Pork-derived Products   ? No reaction, religious preference  ? ?  ? ?  ?Medication List  ?  ? ?STOP taking these medications   ? ?oxyCODONE-acetaminophen 5-325 MG tablet ?Commonly known as: PERCOCET/ROXICET ?  ? ?  ? ?TAKE these medications   ? ?acetaminophen 500 MG tablet ?Commonly known as: TYLENOL ?Take 1,000 mg by mouth every 6 (six) hours as needed. ?  ?cefadroxil 500  MG capsule ?Commonly known as: DURICEF ?Take 1 capsule (500 mg total) by mouth 2 (two) times daily for 9 days. ?  ?ferrous sulfate 325 (65 FE) MG tablet ?Take 1 tablet (325 mg total) by mouth every other day. ?  ?gabapentin 300 MG capsule ?Commonly known as: NEURONTIN ?Take 1 capsule (300 mg total) by mouth 2 (two) times daily. ?  ?ibuprofen 600 MG tablet ?Commonly known as: ADVIL ?Take 1 tablet (600 mg total) by mouth every 6 (six) hours. ?  ?Insulin Pen Needle 32G X 4 MM Misc ?One needle used for each  insulin administration, do not reuse. ?  ?labetalol 300 MG tablet ?Commonly known as: NORMODYNE ?Take 1 tablet (300 mg total) by mouth 2 (two) times daily. ?  ?Levemir FlexPen 100 UNIT/ML FlexPen ?Generic drug: insulin detemir ?Inject 12 Units into the skin daily. ?  ?metFORMIN 1000 MG tablet ?Commonly known as: GLUCOPHAGE ?Take 1 tablet (1,000 mg total) by mouth 2 (two) times daily at 8 am and 10 pm. ?  ?NovoLOG FlexPen 100 UNIT/ML FlexPen ?Generic drug: insulin aspart ?Inject 3 Units into the skin 3 (three) times daily with meals. ?  ?oxyCODONE 5 MG immediate release tablet ?Commonly known as: Oxy IR/ROXICODONE ?Take 1 tablet (5 mg total) by mouth every 6 (six) hours as needed for moderate pain. ?  ?potassium chloride SA 20 MEQ tablet ?Commonly known as: KLOR-CON M ?Take 1 tablet (20 mEq total) by mouth 2 (two) times daily for 2 days. ?  ?PrePLUS 27-1 MG Tabs ?Take 1 tablet by mouth daily. ?  ? ?  ? ? Follow-up Information   ? ? [facility] [employer] at [facility]. Schedule an appointment as soon as possible for a visit in 1 week(s).   ?Specialty: Obstetrics and Gynecology ?Why: BP, blood sugar and wound check ?Contact information: ?[address] ?[place] [address] ?[phone] ? ?  ?  ? ? [facility] Follow up in 6 week(s).   ?Why: management of preexisting diabetes and hypertension ?Contact information: ?[address] ?[place] [address] ?[phone] ? ?  ?  ? ?  ?  ? ?  ? ? ?Signed: ?Griffin Basil ?02/02/2022, 1:48 PM ? ? ?

## 2022-02-02 NOTE — Progress Notes (Addendum)
Inpatient Diabetes Program Recommendations ? ?AACE/ADA: New Consensus Statement on Inpatient Glycemic Control (2015) ? ?Target Ranges:  Prepandial:   less than 140 mg/dL ?     Peak postprandial:   less than 180 mg/dL (1-2 hours) ?     Critically ill patients:  140 - 180 mg/dL  ? ?Lab Results  ?Component Value Date  ? GLUCAP 175 (H) 02/01/2022  ? HGBA1C 7.4 (H) 09/15/2021  ? ? ?Review of Glycemic Control ? Latest Reference Range & Units 02/01/22 16:57 02/01/22 18:19 02/01/22 20:10 02/01/22 23:20  ?Glucose-Capillary 70 - 99 mg/dL 177 (H) 158 (H) 146 (H) 175 (H)  ?(H): Data is abnormally high ?Diabetes history: Type 2 DM ?Outpatient Diabetes medications: Metformin 1000 mg BID ?Current orders for Inpatient glycemic control: Novolog 0-14 units TID ? ?Inpatient Diabetes Program Recommendations:   ? ?Consider changing diet to carb modified diet and Novolog 0-9 units TID & HS (under glycemic control order set). Current order set for pregnant patients.  ?Addendum: Consider also adding Levemir 12 units QD. ? ?Thanks, ?Bronson Curb, MSN, RNC-OB ?Diabetes Coordinator ?[phone] (8a-5p) ? ? ?

## 2022-02-02 NOTE — Lactation Note (Signed)
This note was copied from a baby's chart. ?Lactation Consultation Note ? ?Patient Name: Joann Meyers ?Today's Date: 02/02/2022 ?  ?Age:35 days ? ?Maternal Data ? Lactating patient readmitted 11 days pp. Her infant remains in NICU. Randall paged to [facility] for consult following CT with Iohexol. Reviewed with patient and RN that the specified contrast agent was listed as a L-2 drug and considered compatible with breastfeeding. Reference document was Walker Kehr (2023). Medications and Mothers Milk, p 354. ? ?Pt was pumping when I arrived. She recalls that she did not pump for 12+ hours. She was full but not engorged. She is aware to resume pumping q3 hours.  ? ? ?[name] ?02/02/2022, 2:12 PM ? ? ? ?

## 2022-02-02 NOTE — Progress Notes (Signed)
Patient sleeping. Will come back later to round. ? ?Durene Romans MD ?Attending ?[facility] [employer] Fish farm manager) ?02/02/2022 ?Time: 0725 ? ?

## 2022-02-02 NOTE — Progress Notes (Signed)
?  Echocardiogram ?2D Echocardiogram has been performed. ? ?Fidel Levy ?02/02/2022, 8:54 AM ?

## 2022-02-02 NOTE — Progress Notes (Signed)
Gynecology Progress Note  ?Admission Date: 02/01/2022 ?Current Date: 02/02/2022 ?1:10 PM ? ?Joann Meyers is a 36 y.o. [id number] HD#2 admitted for symptomatic anemia and elevated blood sugars in the postpartum period.  ? ?History complicated by: ?Patient Active Problem List  ? Diagnosis Date Noted  ? Symptomatic anemia 02/01/2022  ? S/P repeat cesarean section 01/22/2022  ? Placental abruption, delivered 01/22/2022  ? Fetal growth restriction antepartum 01/16/2022  ? Constipation 01/16/2022  ? Malpresentation before onset of labor 12/20/2021  ? BMI 38.0-38.9,adult 11/15/2021  ? Abnormal genetic test during pregnancy 11/15/2021  ? Preterm premature rupture of membranes (PPROM) at 18 weeks, delivered at 19 weeks 11/14/2021  ? Placenta previa, posterior 11/09/2021  ? Red Chart Rounds Patient 10/20/2021  ? Proteinuria affecting pregnancy, antepartum 09/17/2021  ? Rubella non-immune status, antepartum 09/16/2021  ? Anemia affecting pregnancy in second trimester 09/16/2021  ? History of severe preeclampsia, prior pregnancy, currently pregnant 09/15/2021  ? Previous cesarean delivery, antepartum 09/15/2021  ? Postoperative anemia due to acute blood loss 08/06/2020  ? Migraine headache 08/04/2020  ? Benign gestational thrombocytopenia in second trimester ([place]) 07/15/2020  ? 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 Chronic hypertension affecting pregnancy 03/24/2020  ? Obesity in pregnancy, antepartum 03/24/2020  ? Supervision of high risk pregnancy, antepartum 02/04/2020  ? Preexisting diabetes complicating pregnancy, antepartum 02/04/2020  ? Seizures ([place])   ? Non-compliance 07/22/2017  ? History of TIA (transient ischemic attack) and stroke 03/03/2017  ? ? ?ROS and patient/family/surgical history, located on admission H&P note dated 02/01/2022, have been reviewed, and there are no changes except as noted below ?Yesterday/Overnight Events:   ?Pt has received echocardiogram and is s/p transfusion 1 unit PRBC. ? ?Subjective:  ?Pt seen.  She feels much better s/p transfusion.  She notes the "black spots" in her vision have receded.  Pt feels better and would like to go home for child care reasons. ? ?Objective:  ? ?Vitals:  ? 02/02/22 0840 02/02/22 0848 02/02/22 0850 02/02/22 1219  ?BP:  120/65  138/63  ?Pulse:  81  83  ?Resp:  20  17  ?Temp:  98.2 ?F (36.8 ?C)  98.4 ?F (36.9 ?C)  ?TempSrc:  Oral  Oral  ?SpO2: 98% 97% 97% 97%  ?Weight:      ?Height:      ? ? ?Temp:  [98 ?F (36.7 ?C)-99.1 ?F (37.3 ?C)] 98.4 ?F (36.9 ?C) (04/20 1219) ?Pulse Rate:  [71-98] 83 (04/20 1219) ?Resp:  [16-20] 17 (04/20 1219) ?BP: (110-172)/(57-79) 138/63 (04/20 1219) ?SpO2:  [89 %-100 %] 97 % (04/20 1219) ?Weight:  [97.5 kg] 97.5 kg (04/19 2052) ?I/O last 3 completed shifts: ?In: 370 [Blood:370] ?Out: -  ?No intake/output data recorded. ? ?Intake/Output Summary (Last 24 hours) at 02/02/2022 1310 ?Last data filed at 02/02/2022 0134 ?Gross per 24 hour  ?Intake 370 ml  ?Output --  ?Net 370 ml  ? ? ? Current Vital Signs 24h Vital Sign Ranges  ?T 98.4 ?F (36.9 ?C) Temp  Avg: 98.3 ?F (36.8 ?C)  Min: 98 ?F (36.7 ?C)  Max: 99.1 ?F (37.3 ?C)  ?BP 138/63 BP  Min: 110/63  Max: 172/78  ?HR 83 Pulse  Avg: 86  Min: 71  Max: 98  ?RR 17 Resp  Avg: 17.6  Min: 16  Max: 20  ?SaO2 97 % Room Air SpO2  Avg: 95.9 %  Min: 89 %  Max: 100 %  ?    ?  24 Hour I/O Current Shift I/O  ?Time ?Ins ?Outs 04/19 0701 - 04/20 0700 ?In: 370  ?Out: -  No intake/output data recorded.  ? ?Patient Vitals for the past 12 hrs: ? BP Temp Temp src Pulse Resp SpO2  ?02/02/22 1219 138/63 98.4 ?F (36.9 ?C) Oral 83 17 97 %  ?02/02/22 0850 -- -- -- -- -- 97 %  ?02/02/22 0848 120/65 98.2 ?F (36.8 ?C) Oral 81 20 97 %  ?02/02/22 0840 -- -- -- -- -- 98 %  ?02/02/22 0835 -- -- -- -- -- 97 %  ?02/02/22 0830 -- -- -- -- -- 98 %  ?02/02/22 0825 -- -- -- -- -- 97 %  ?02/02/22 0820 -- -- -- -- -- 97 %  ?02/02/22 0815 -- -- -- -- -- 97 %   ?02/02/22 0810 -- -- -- -- -- 96 %  ?02/02/22 0805 -- -- -- -- -- 98 %  ?02/02/22 0800 -- -- -- -- -- 99 %  ?02/02/22 0755 -- -- -- -- -- 96 %  ?02/02/22 0750 -- -- -- -- -- 96 %  ?02/02/22 0745 -- -- -- -- -- 96 %  ?02/02/22 0740 -- -- -- -- -- 96 %  ?02/02/22 0735 -- -- -- -- -- 97 %  ?02/02/22 0730 -- -- -- -- -- 97 %  ?02/02/22 0725 -- -- -- -- -- 97 %  ?02/02/22 0720 -- -- -- -- -- 97 %  ?02/02/22 0715 -- -- -- -- -- 96 %  ?02/02/22 0710 -- -- -- -- -- 96 %  ?02/02/22 0705 -- -- -- -- -- 98 %  ?02/02/22 0700 -- -- -- -- -- 97 %  ?02/02/22 0655 -- -- -- -- -- 95 %  ?02/02/22 0650 -- -- -- -- -- 95 %  ?02/02/22 0645 -- -- -- -- -- 97 %  ?02/02/22 0640 -- -- -- -- -- 94 %  ?02/02/22 0635 -- -- -- -- -- 97 %  ?02/02/22 0630 -- -- -- -- -- 95 %  ?02/02/22 0625 -- -- -- -- -- 95 %  ?02/02/22 0620 -- -- -- -- -- 95 %  ?02/02/22 0615 -- -- -- -- -- 95 %  ?02/02/22 0610 -- -- -- -- -- 95 %  ?02/02/22 0605 -- -- -- -- -- 95 %  ?02/02/22 0600 -- -- -- -- -- 95 %  ?02/02/22 0555 -- -- -- -- -- 95 %  ?02/02/22 0550 -- -- -- -- -- 96 %  ?02/02/22 0545 -- -- -- -- -- 98 %  ?02/02/22 0540 -- -- -- -- -- 97 %  ?02/02/22 0535 -- -- -- -- -- 97 %  ?02/02/22 0530 -- -- -- -- -- 97 %  ?02/02/22 0525 -- -- -- -- -- 97 %  ?02/02/22 0520 -- -- -- -- -- 96 %  ?02/02/22 0515 -- -- -- -- -- 96 %  ?02/02/22 0510 -- -- -- -- -- 97 %  ?02/02/22 0505 -- -- -- -- -- 97 %  ?02/02/22 0500 -- -- -- -- -- 98 %  ?02/02/22 0455 -- -- -- -- -- 97 %  ?02/02/22 0450 -- -- -- -- -- 98 %  ?02/02/22 0445 -- -- -- -- -- 95 %  ?02/02/22 0440 -- -- -- -- -- 97 %  ?02/02/22 0435 -- -- -- -- -- 96 %  ?02/02/22 0432 110/63 98 ?F (36.7 ?C) Oral 71 17 99 %  ?02/02/22 0430 -- -- -- -- --  97 %  ?02/02/22 0425 -- -- -- -- -- 96 %  ?02/02/22 0420 -- -- -- -- -- 97 %  ?02/02/22 0415 -- -- -- -- -- 97 %  ?02/02/22 0410 -- -- -- -- -- 96 %  ?02/02/22 0405 -- -- -- -- -- 96 %  ?02/02/22 0400 -- -- -- -- -- 98 %  ?02/02/22 0355 -- -- -- -- -- 96 %  ?02/02/22 0350 -- --  -- -- -- 97 %  ?02/02/22 0345 -- -- -- -- -- 96 %  ?02/02/22 0340 -- -- -- -- -- 96 %  ?02/02/22 0335 -- -- -- -- -- 96 %  ?02/02/22 0330 -- -- -- -- -- 96 %  ?02/02/22 0325 -- -- -- -- -- 96 %  ?02/02/22 0320 -- -- -- -- -- 96 %  ?02/02/22 0315 -- -- -- -- -- 96 %  ?02/02/22 0310 -- -- -- -- -- 96 %  ?02/02/22 0305 -- -- -- -- -- 96 %  ?02/02/22 0300 -- -- -- -- -- 96 %  ?02/02/22 0255 -- -- -- -- -- 96 %  ?02/02/22 0250 -- -- -- -- -- 96 %  ?02/02/22 0245 -- -- -- -- -- 99 %  ?02/02/22 0240 -- -- -- -- -- 98 %  ?02/02/22 0235 -- -- -- -- -- 99 %  ?02/02/22 0230 -- -- -- -- -- 97 %  ?02/02/22 0225 -- -- -- -- -- 97 %  ?02/02/22 0220 -- -- -- -- -- 99 %  ?02/02/22 0215 -- -- -- -- -- 96 %  ?02/02/22 0210 -- -- -- -- -- 97 %  ?02/02/22 0205 -- -- -- -- -- 97 %  ?02/02/22 0200 -- -- -- -- -- 97 %  ?02/02/22 0155 -- -- -- -- -- 96 %  ?02/02/22 0150 -- -- -- -- -- 96 %  ?02/02/22 0145 -- -- -- -- -- 96 %  ?02/02/22 0140 -- -- -- -- -- 95 %  ?02/02/22 0137 118/62 98.2 ?F (36.8 ?C) Oral 78 18 95 %  ?02/02/22 0130 -- -- -- -- -- 96 %  ?02/02/22 0125 -- -- -- -- -- 96 %  ?02/02/22 0120 -- -- -- -- -- 97 %  ?02/02/22 0115 -- -- -- -- -- 95 %  ? ? ? ?Patient Vitals for the past 24 hrs: ? BP Temp Temp src Pulse Resp SpO2 Height Weight  ?02/02/22 1219 138/63 98.4 ?F (36.9 ?C) Oral 83 17 97 % -- --  ?02/02/22 0850 -- -- -- -- -- 97 % -- --  ?02/02/22 0848 120/65 98.2 ?F (36.8 ?C) Oral 81 20 97 % -- --  ?02/02/22 0840 -- -- -- -- -- 98 % -- --  ?02/02/22 0835 -- -- -- -- -- 97 % -- --  ?02/02/22 0830 -- -- -- -- -- 98 % -- --  ?02/02/22 0825 -- -- -- -- -- 97 % -- --  ?02/02/22 0820 -- -- -- -- -- 97 % -- --  ?02/02/22 0815 -- -- -- -- -- 97 % -- --  ?02/02/22 0810 -- -- -- -- -- 96 % -- --  ?02/02/22 0805 -- -- -- -- -- 98 % -- --  ?02/02/22 0800 -- -- -- -- -- 99 % -- --  ?02/02/22 0755 -- -- -- -- -- 96 % -- --  ?02/02/22 0750 -- -- -- -- -- 96 % -- --  ?02/02/22 0745 -- -- -- -- -- 96 % -- --  ?  02/02/22 0740 -- -- -- -- -- 96  % -- --  ?02/02/22 0735 -- -- -- -- -- 97 % -- --  ?02/02/22 0730 -- -- -- -- -- 97 % -- --  ?02/02/22 0725 -- -- -- -- -- 97 % -- --  ?02/02/22 0720 -- -- -- -- -- 97 % -- --  ?02/02/22 0715 -- -- -- -- --

## 2022-02-02 NOTE — Progress Notes (Signed)
Pt discharged after d/c instructions given. Pt verbalized understanding and all questions answered. IV discontinued. Pt discharged in stable condition with all belongings.  ?

## 2022-02-03 ENCOUNTER — Ambulatory Visit: Payer: Medicaid Other

## 2022-02-03 ENCOUNTER — Telehealth (HOSPITAL_COMMUNITY): Payer: Self-pay | Admitting: *Deleted

## 2022-02-03 NOTE — Telephone Encounter (Signed)
Left phone voicemail message. ? ?Odis Hollingshead, RN 02-03-2022 at 9:42am ?

## 2022-02-06 ENCOUNTER — Ambulatory Visit: Payer: Self-pay

## 2022-02-06 NOTE — Lactation Note (Signed)
This note was copied from a baby's chart. ?Lactation Consultation Note ? ?Patient Name: Joann Meyers ?Today's Date: 02/06/2022 ?Reason for consult: Follow-up assessment;NICU baby;Preterm <34wks;Infant < 6lbs;Weekly NICU follow-up;Maternal endocrine disorder;Other (Comment) (AMA) ?Age:35 wk.o. ? ?Upon entering room mom was pumping R breast. Student asked mom if mom usually pumped one breast at a time. Mom stated that she does pump one breast at a time , so she can have a free hand. Student explained the importance of pumping both breast at the same time. Mom asked about getting a pumping band. Student retrieved one for mom, but the pumping band was too small. LC and Student to issue pumping band later.  ? ?Student talked to mom about milk storage and milk bank throwing away expired milk. Mom stated she, "didn't bring frozen milk". Student reeducated mom on milk storage guidelines.  ? ?Student noticed red and cracked nipples while mom was pumping.Mom explained that she was having pain and discomfort in both nipples. Expressed that she had been putting EBM on nipples for pain. [place] and student encouraged mom to continue to putting EBM on nipples and flanges. Suggested that she try using coconut oil as well. LC resized mom's flange to 27.  ? ?LC explained to mom that she didn't need to use the initiation phase on DEBP anymore. ?   ? ?Plan:  ?Mom to continue to pump q3 ?Mom to use new flange size (27) ?Mom to use coconut oil and EBM on nipples  ?Mom to follow milk storage guidelines ?Mom to stop using initiation phase on DEBP and start using expression phase instead.  ? ?[place] and Student answered all questions and concerns at this time. Encouraged mom to call for [facility] assistants PRN.  ? ?Feeding ?Mother's Current Feeding Choice: Breast Milk ? ? ?Lactation Tools Discussed/Used ?Tools: Pump;Flanges;Coconut oil;Hands-free pumping top ?Flange Size: 27 (resized from 24) ?Breast pump type: Double-Electric Breast Pump ?Pump  Education: Setup, frequency, and cleaning;Milk Storage ?Reason for Pumping: Baby in NICU ?Pumping frequency: q3 ?Pumped volume: 120 mL ? ?Interventions ?Interventions: Expressed milk;Coconut oil;Education;DEBP ? ?Discharge ?Pump: DEBP;Personal (WIC Pump) ? ?Consult Status ?Consult Status: Follow-up ?Date: 02/06/22 ?Follow-up type: In-patient ? ? ? ?Joeseph Amor Robinson-Sullivan ?02/06/2022, 1:06 PM ? ? ? ?

## 2022-02-06 NOTE — Lactation Note (Signed)
This note was copied from a baby's chart. ?Lactation Consultation Note ? ?Patient Name: Joann Meyers ?Today's Date: 02/06/2022 ?  ?Age:35 wk.o. ? ?Maternal Data ?Student issued pumping band, breast pads, and extra set of size 27 flanges to mom. Mom stated that using size 27 flange felt much better.  ? ?Student praised mom for efforts. Answered all questions and concerns. Mom to continue following current pumping plan. Mom to call for [facility] services PRN. ? ? ?Joeseph Amor Robinson-Sullivan ?02/06/2022, 3:12PM ? ? ? ?

## 2022-02-07 ENCOUNTER — Ambulatory Visit (INDEPENDENT_AMBULATORY_CARE_PROVIDER_SITE_OTHER): Payer: Medicaid Other

## 2022-02-07 VITALS — BP 149/81 | HR 86

## 2022-02-07 DIAGNOSIS — R519 Headache, unspecified: Secondary | ICD-10-CM

## 2022-02-07 DIAGNOSIS — I10 Essential (primary) hypertension: Secondary | ICD-10-CM

## 2022-02-07 DIAGNOSIS — Z98891 History of uterine scar from previous surgery: Secondary | ICD-10-CM

## 2022-02-07 DIAGNOSIS — E119 Type 2 diabetes mellitus without complications: Secondary | ICD-10-CM

## 2022-02-07 DIAGNOSIS — O9089 Other complications of the puerperium, not elsewhere classified: Secondary | ICD-10-CM

## 2022-02-07 MED ORDER — NYSTATIN 100000 UNIT/GM EX POWD: 1.0000 "application " | Freq: Three times a day (TID) | CUTANEOUS | 0 refills | Status: DC

## 2022-02-07 MED ORDER — ACCU-CHEK SOFTCLIX LANCETS MISC: 11 refills | Status: DC

## 2022-02-07 MED ORDER — CYCLOBENZAPRINE HCL 10 MG PO TABS: 10.0000 mg | ORAL_TABLET | Freq: Three times a day (TID) | ORAL | 0 refills | Status: DC | PRN

## 2022-02-07 MED ORDER — ACCU-CHEK GUIDE VI STRP: ORAL_STRIP | 11 refills | Status: DC

## 2022-02-07 MED ORDER — HYDROCHLOROTHIAZIDE 25 MG PO TABS: 25.0000 mg | ORAL_TABLET | Freq: Every day | ORAL | 11 refills | Status: DC

## 2022-02-07 NOTE — Progress Notes (Signed)
Blood Pressure and Incision Check Visit ? ?Joann Meyers is here for blood pressure and incision check following c-section on 01/22/22. Patient reports pain is well controlled without oxycodone. Incision is open to air and clean, dry, and intact. Surrounding area is red with small raised bumps. Dione Plover, MD to bedside to assess, who recommends Nystatin powder. Reviewed s/s of infection with patient and good wound care; patient to shower daily, completely dry incision, and apply nystatin powder.  ? ?History of chronic hypertension. Patient reports she was told to stop Labetalol following last visit to [facility]. BP today is 149/81. Dione Plover, MD recommends hydrochlorothiazide 25 mg daily. Reports headache for the past 3 days. Improvement with Tylenol and ibuprofen but headache returns. Describes pain as on the left side of forehead to behind the ear. Also reports stabbing pain from lower back to neck. Dione Plover, MD recommends Flexeril 10 mg TID PRN for headache. Denies shortness of breath. Reports ongoing vision changes since delivery. States her vision is: Oncologist like window curtains when you can still see." Appt scheduled with opthalmology 02/08/22 due to poorly controlled diabetes.  ? ?Patient will return in 1 week for BP check.  ? ?Annabell Howells, RN ?02/07/2022  1:27 PM ? ?

## 2022-02-14 ENCOUNTER — Non-Acute Institutional Stay (HOSPITAL_COMMUNITY)
Admission: RE | Admit: 2022-02-14 | Discharge: 2022-02-14 | Disposition: A | Discharge status: Home / Self Care | Payer: Medicaid Other | Source: Ambulatory Visit | Attending: Internal Medicine | Admitting: Internal Medicine

## 2022-02-14 DIAGNOSIS — O9081 Anemia of the puerperium: Secondary | ICD-10-CM | POA: Insufficient documentation

## 2022-02-14 MED ORDER — SODIUM CHLORIDE 0.9 % IV SOLN
500.0000 mg | Freq: Once | INTRAVENOUS | Status: AC
  Administered 2022-02-14: 500 mg via INTRAVENOUS
  Filled 2022-02-14: qty 25

## 2022-02-14 MED ORDER — ACETAMINOPHEN 500 MG PO TABS
1000.0000 mg | ORAL_TABLET | Freq: Once | ORAL | Status: AC
  Administered 2022-02-14: 1000 mg via ORAL
  Filled 2022-02-14: qty 2

## 2022-02-14 MED ORDER — SODIUM CHLORIDE 0.9 % IV SOLN: INTRAVENOUS | Status: DC | PRN

## 2022-02-14 NOTE — Progress Notes (Signed)
PATIENT CARE CENTER NOTE: ? ?Diagnosis: anemia postpartum ? ?Provider: Julianne Handler CNM ? ?Procedure:  Venofer '500mg'$  ? ? ?Patient received IV Venofer ( dose 1 of 1).  No premeds required per orders, and clarified with provider that pt has tolerated venofer well in the past but has a hx of allergy to feraheme. BP prior to venofer 156/85, pt states she forgot to take her BP medications today, and last dose was taken on Sunday. Provider's RN Apolonio Schneiders, notified, instructed pt to take BP medication once back at home and is scheduled for a BP check at [facility] office tomorrow. Advised pt, verbalized understanding. Instructed to proceed with infusion and notify provider if BP exceeds 160/100. Pt received 1 x dose of Tylenol for HA per provider. Tolerated well, vitals stable,  BP post infusion  145/59. Discharge instructions given , verbalized understanding. Patient alert, oriented, and ambulatory at the time of discharge.   ?

## 2022-02-15 ENCOUNTER — Ambulatory Visit (INDEPENDENT_AMBULATORY_CARE_PROVIDER_SITE_OTHER): Payer: Medicaid Other

## 2022-02-15 VITALS — BP 159/71 | HR 89 | Wt 209.6 lb

## 2022-02-15 DIAGNOSIS — Z0130 Encounter for examination of blood pressure without abnormal findings: Secondary | ICD-10-CM

## 2022-02-15 NOTE — Progress Notes (Signed)
Blood Pressure Check Visit ? ?Joann Meyers is here for blood pressure check following a repeat c-section on 01/22/22. BP today is 157/77. BP recheck approximately 10 minutes later 159/71. Patient endorses headache and blurred vision. Patient states Tylenol, Ibuprofen and Flexeril have not helped with her headache. Patient is prescribed Hydrochlorothiazide 25 mg daily and Labetalol 300 mg BID. When asked if patient has been taking these medications she states "I take a lot of medications I am not sure but I think probably." I reviewed this with Dr. Dione Plover. Per Dr. Dione Plover patient is to return tomorrow for a medication reconciliation appointment. Patient is to bring all of her medications and a pill box. I reviewed this with the patient. Patient verbalized understanding and denies any other questions. I reviewed patient's postpartum appointment date and time with her.  ? ?Seth Bake, RN ?02/15/2022   ?

## 2022-02-16 ENCOUNTER — Ambulatory Visit: Payer: Self-pay

## 2022-02-16 ENCOUNTER — Ambulatory Visit: Payer: Medicaid Other

## 2022-02-16 NOTE — Lactation Note (Signed)
This note was copied from a baby's chart. ? ?NICU Lactation Consultation Note ? ?Patient Name: Joann Meyers ?Today's Date: 02/16/2022 ?Age:35 wk.o. ? ?Subjective ?Reason for consult: Follow-up assessment; NICU baby ? ?Lactation followed up with Joann Meyers. We discussed her pumping practices. She is using a [facility] loaner pump at home. She is pumping q2 hours; when she spaces her pump to 3 hours, she obtains more volume. I recommended that she could increase her interval to 8-10 times a day to allow her to rest at night. She states that she prefers to pump more frequently. ? ?I answered questions about maternal diet and breastfeeding and provided some web-based resources for evidence-based recommendations.  ? ?Objective ?Infant data: ?Mother's Current Feeding Choice: Breast Milk ? ?Infant feeding assessment ? ?Maternal data: ?G2P0202  ?C-Section, Low Transverse ?Current breast feeding challenges:: NICU - separation ? ?Previous breastfeeding challenges?: Infant separation ? ?Pumping frequency: q2 hours; 12 times a day (per mom) ?Pumped volume: 60 mL ? ?[place] Program: Yes ?[place] Referral Sent?: Yes ?Pump: WIC Loaner ? ?Assessment ?Maternal: ?Milk volume: Normal ? ?Intervention/Plan ?Interventions: Breast feeding basics reviewed; Education ? ?Pump Education: Setup, frequency, and cleaning ? ?Plan: ?Consult Status: Follow-up ? ?NICU Follow-up type: Weekly NICU follow up ? ? ? ?Lenore Manner ?02/16/2022, 3:35 PM ?

## 2022-02-22 ENCOUNTER — Ambulatory Visit (INDEPENDENT_AMBULATORY_CARE_PROVIDER_SITE_OTHER): Payer: Medicaid Other | Admitting: Family Medicine

## 2022-02-22 ENCOUNTER — Encounter: Payer: Self-pay | Admitting: Family Medicine

## 2022-02-22 DIAGNOSIS — Z2839 Other underimmunization status: Secondary | ICD-10-CM

## 2022-02-22 DIAGNOSIS — O24319 Unspecified pre-existing diabetes mellitus in pregnancy, unspecified trimester: Secondary | ICD-10-CM

## 2022-02-22 DIAGNOSIS — E119 Type 2 diabetes mellitus without complications: Secondary | ICD-10-CM

## 2022-02-22 DIAGNOSIS — O09299 Supervision of pregnancy with other poor reproductive or obstetric history, unspecified trimester: Secondary | ICD-10-CM

## 2022-02-22 DIAGNOSIS — Z98891 History of uterine scar from previous surgery: Secondary | ICD-10-CM

## 2022-02-22 DIAGNOSIS — R569 Unspecified convulsions: Secondary | ICD-10-CM

## 2022-02-22 DIAGNOSIS — O09899 Supervision of other high risk pregnancies, unspecified trimester: Secondary | ICD-10-CM

## 2022-02-22 DIAGNOSIS — Z8673 Personal history of transient ischemic attack (TIA), and cerebral infarction without residual deficits: Secondary | ICD-10-CM

## 2022-02-22 DIAGNOSIS — O10919 Unspecified pre-existing hypertension complicating pregnancy, unspecified trimester: Secondary | ICD-10-CM

## 2022-02-22 MED ORDER — ACCU-CHEK GUIDE VI STRP: ORAL_STRIP | 11 refills | Status: DC

## 2022-02-22 MED ORDER — NOVOLOG FLEXPEN 100 UNIT/ML ~~LOC~~ SOPN: PEN_INJECTOR | SUBCUTANEOUS | 11 refills | Status: DC

## 2022-02-22 MED ORDER — ACCU-CHEK SOFTCLIX LANCETS MISC: 11 refills | Status: DC

## 2022-02-22 MED ORDER — INSULIN PEN NEEDLE 32G X 4 MM MISC: 2 refills | Status: DC

## 2022-02-22 MED ORDER — LEVEMIR FLEXPEN 100 UNIT/ML ~~LOC~~ SOPN: 12.0000 [IU] | PEN_INJECTOR | Freq: Every day | SUBCUTANEOUS | 11 refills | Status: DC

## 2022-02-22 NOTE — Progress Notes (Signed)
? ? ?Post Partum Visit Note ? ?Joann Meyers is a 36 y.o. G62P0202 female who presents for a postpartum visit. She is 4 weeks postpartum following a repeat cesarean section.  I have fully reviewed the prenatal and intrapartum course. The delivery was at 29.3 gestational weeks.  Anesthesia: spinal. Postpartum course has been notable for several presentations to MAU for pain and elevated BP's as well as one night admission for diuresis and symptomatic anemia for which she received 1u pRBC. Also seen for BP check one week prior, recommendation to return with meds for med rec and did not do so.  ? ?Joann Meyers is doing well, still in NICU for another month or so. Baby is feeding by  pumping . Bleeding staining only. Bowel function is normal. Bladder function is normal. Patient is not sexually active. Contraception method is condoms. Postpartum depression screening: negative. ? ? ?The pregnancy intention screening data noted above was reviewed. Potential methods of contraception were discussed. The patient elected to proceed with No data recorded. ? ? Edinburgh Postnatal Depression Scale - 02/22/22 1349   ? ?  ? Edinburgh Postnatal Depression Scale:  In the Past 7 Days  ? I have been able to laugh and see the funny side of things. 0   ? I have looked forward with enjoyment to things. 0   ? I have blamed myself unnecessarily when things went wrong. 0   ? I have been anxious or worried for no good reason. 0   ? I have felt scared or panicky for no good reason. 0   ? Things have been getting on top of me. 0   ? I have been so unhappy that I have had difficulty sleeping. 3   ? I have felt sad or miserable. 0   ? I have been so unhappy that I have been crying. 0   ? The thought of harming myself has occurred to me. 0   ? Edinburgh Postnatal Depression Scale Total 3   ? ?  ?  ? ?  ? ? ?Health Maintenance Due  ?Topic Date Due  ? COVID-19 Vaccine (1) Never done  ? FOOT EXAM  Never done  ? OPHTHALMOLOGY EXAM  Never done  ? HEMOGLOBIN  A1C  12/14/2021  ? URINE MICROALBUMIN  01/14/2022  ? ? ?The following portions of the patient's history were reviewed and updated as appropriate: allergies, current medications, past family history, past medical history, past social history, past surgical history, and problem list. ? ?Review of Systems ?Pertinent items noted in HPI and remainder of comprehensive ROS otherwise negative. ? ?Objective:  ?BP (!) 144/81   Pulse (!) 101   Ht '5\' 2"'$  (1.575 m)   Wt 208 lb 6.4 oz (94.5 kg)   LMP  (LMP Unknown)   Breastfeeding Yes   BMI 38.12 kg/m?   ? ?General:  alert, cooperative, and appears stated age  ? Breasts:  not indicated  ?Lungs: Comfortable on room air  ?Wound well approximated incision  ?GU exam:  not indicated  ?     ?Assessment:  ? ? There are no diagnoses linked to this encounter. ? ?Normal postpartum exam.  ? ?Plan:  ? ?Essential components of care per ACOG recommendations: ? ?1.  Mood and well being: Patient with negative depression screening today. Reviewed local resources for support.  ?- Patient tobacco use? No.   ?- hx of drug use? No.   ? ?2. Infant care and feeding:  ?-Patient currently breastmilk feeding?  No.  ?-Social determinants of health (SDOH) reviewed in EPIC. The following needs were identified: food, offered market ? ?3. Sexuality, contraception and birth spacing ?- Patient does not want a pregnancy in the next year.  Desired family size is 2 children.  ?- Reviewed reproductive life planning. Reviewed contraceptive methods based on pt preferences and effectiveness.  Patient desired Female Condom today.  Strongly recommended more effective method, she declined. ?- Discussed birth spacing of 18 months ? ?4. Sleep and fatigue ?-Encouraged family/partner/community support of 4 hrs of uninterrupted sleep to help with mood and fatigue ? ?5. Physical Recovery  ?- Discussed patients delivery and complications. She describes her labor as mixed. ?- Patient had a C-section emergent.  ?- Patient has  urinary incontinence? No. ?- Patient is safe to resume physical and sexual activity ? ?6.  Health Maintenance ?- HM due items addressed Yes ?- Last pap smear  ?Diagnosis  ?Date Value Ref Range Status  ?03/03/2020   Final  ? - Negative for intraepithelial lesion or malignancy (NILM)  ? Pap smear not done at today's visit.  ?-Breast Cancer screening indicated? No.  ? ?7. Chronic Disease/Pregnancy Condition follow up: Anemia, Hypertension, and Gestational Diabetes ? ?Diabetes - reports fastings and early prandials are OK, but getting 180's after dinner. Tried 8u last night but had low of 52. Increased evening novolog to 5 only, keep levemir at 12u daily and 3u with breakfast and lunch. Refills sent for insulin and supplies. Still taking metformin 1g BID. ?Also referred to IM for control of DM2 ? ?Was supposed to come for med rec but had transportation issues. Strongly recommended she do med rec w [facility]. Spoke directly with [facility] and she will be able to assist with this ? ?Taking HCTZ '25mg'$  and labetalol 300 BID, better BP today, encouraged to continue to take and to follow up with PCP.  ? ? ?Clarnce Flock, MD ?[facility] [place] ? ?

## 2022-02-23 ENCOUNTER — Other Ambulatory Visit: Payer: Self-pay

## 2022-03-02 ENCOUNTER — Encounter: Payer: Self-pay | Admitting: Family Medicine

## 2022-03-05 ENCOUNTER — Ambulatory Visit: Payer: Self-pay

## 2022-03-05 NOTE — Lactation Note (Signed)
This note was copied from a baby's chart. Lactation Consultation Note  Patient Name: Joann Meyers [id number] Date: 03/05/2022   Age:35 wk.o.  LC in the room to visit with mom but she hasn't come to the unit today. Called her number on file but no answer and no option to leave a voicemail. Noticed that baby is still on an 100% breastmilk diet, Ms. Weddington has been turning her milk in periodically. Will get updates on pumping status the next time she comes to visit baby.   [place] 03/05/2022, 5:56 PM

## 2022-03-10 ENCOUNTER — Ambulatory Visit: Payer: Self-pay

## 2022-03-10 NOTE — Lactation Note (Signed)
This note was copied from a baby's chart.  NICU Lactation Consultation Note  Patient Name: Joann Meyers [id number] Date: 03/10/2022 Age:35 wk.o.   Subjective Reason for consult: Follow-up assessment Mother is concerned about a recent decrease in her milk supply. She has not changed her pumping routine and is not taking any new medication. I watched mother pump and verified correct flange size. I noticed that she is leaving pump on let-down phase without switching to milk removal phase. I demonstrated this and she observed an immediate increase in output.   Objective Infant data: Mother's Current Feeding Choice: Breast Milk  Infant feeding assessment Scale for Readiness: 3   Maternal data: G2P0202  C-Section, Low Transverse No data recorded Current breast feeding challenges:: drop in milk volume  Pumping frequency: q3h day and night Pumped volume: 30 mL   WIC Program: Yes WIC Referral Sent?: Yes Pump: WIC Loaner  Assessment Maternal: Milk volume: Low   Intervention/Plan Interventions: Education  No data recorded Plan: Consult Status: NICU follow-up  NICU Follow-up type: Weekly NICU follow up  Mother will press letdown button as directed to improve milk removal efficiency.   [name] 03/10/2022, 3:18 PM

## 2022-03-20 ENCOUNTER — Ambulatory Visit: Payer: Self-pay

## 2022-03-20 NOTE — Lactation Note (Signed)
This note was copied from a baby's chart.  NICU Lactation Consultation Note  Patient Name: Boy Jon Kasparek ELFYB'O Date: 03/20/2022 Age:35 wk.o.  Subjective Reason for consult: NICU baby; Early term 27-38.6wks; Follow-up assessment; Other (Comment); Maternal endocrine disorder (AMA)  Visited with mom of 47 32/18 weeks old (adjusted) NICU female, she's reports her supply continues to dwindle. Asked her if she tried power pumping and she said she's only done for 3 days because [facility] to D/C power pumping after 3 days. Baby is still in an exclusive breastmilk diet because the milk bank still has plenty of her milk, she was told not to bring more. She also has 50 bottles of breastmilk in her freezer at home. Reviewed pumping schedule, power pumping and supply and demand. Ms. Mahler wishes to take baby to breast once he's ready, so far no PO feedings noted on chart.   Objective Infant data: Mother's Current Feeding Choice: Breast Milk Infant feeding assessment Scale for Readiness: 2 Scale for Quality: -- (on CPAP)  Maternal data: G2P0202  C-Section, Low Transverse Pumping frequency: 8 times/24 hours Pumped volume: 10 mL (10-15 ml) Flange Size: 27 Pump: WIC pump  Assessment Infant: In NICU  Maternal: Milk volume: Low  Intervention/Plan Interventions: Breast feeding basics reviewed; DEBP; Education Tools: Pump Pump Education: Setup, frequency, and cleaning; Milk Storage  Plan of care: Encouraged mom to continue pumping consistently every 3 hours, ideally 8 pumping sessions/24 hours She'll start power pumping in the AM every day for at least 7 days or until the next time she sees lactation She'll call for latch assistance once baby is ready to go to breast   All questions and concerns answered, mom to contact [facility] [facility] PRN.  Consult Status: NICU follow-up NICU Follow-up type: Weekly NICU follow up   [place] 03/20/2022, 5:20 PM

## 2022-03-23 ENCOUNTER — Ambulatory Visit: Payer: Self-pay

## 2022-03-23 NOTE — Lactation Note (Signed)
This note was copied from a baby's chart.  NICU Lactation Consultation Note  Patient Name: Boy Anaeli Cornwall [id number] Date: 03/23/2022 Age:36 years old   Subjective Reason for consult: Mother's request Mother's milk supply continues to decrease. Her pumping routine has not changed and she is not taking any medications. We reviewed her pumping strategies and I suggested she try Fenugreek. I reviewed with mother that if her supply does not rebound, she should follow up with her MD.  Objective Infant data: Mother's Current Feeding Choice: Breast Milk  Infant feeding assessment Scale for Readiness: 2     Maternal data: G2P0202  C-Section, Low Transverse Pumping frequency: q3 + power pumping Pumped volume: 10 mL  Risk factor for low milk supply:: T2DM, hx low milk supply   WIC Program: Yes WIC Referral Sent?: Yes Pump: DEBP (WIC pump)  Assessment Maternal: Milk volume: Low Mother with late onset low milk supply.  Intervention/Plan Interventions: Education   Plan: Consult Status: NICU follow-up  NICU Follow-up type: Weekly NICU follow up  Mother to continue pumping q3h and consider fenugreek to raise prolactin level. Mother to f/u with her MD if supply does not rebound.   [name] 03/23/2022, 4:42 PM

## 2022-04-01 ENCOUNTER — Ambulatory Visit: Payer: Self-pay

## 2022-04-01 NOTE — Lactation Note (Signed)
This note was copied from a baby's chart. Lactation Consultation Note  Patient Name: Joann Meyers [id number] Date: 04/01/2022   Age:35 m.o.  Attempted to visit with mom multiple times the last three days but she hasn't come to the hospital during day shift. Tried calling but no option to leave a voicemail. NICU RN Lattie Haw aware to call [facility] if mom comes in the unit to check on pumping status.   [place] 04/01/2022, 6:12 PM

## 2022-04-08 ENCOUNTER — Ambulatory Visit: Payer: Self-pay

## 2022-04-14 ENCOUNTER — Ambulatory Visit: Payer: Self-pay

## 2022-04-14 NOTE — Lactation Note (Signed)
This note was copied from a baby's chart. Lactation Consultation Note Mother has discontinued pumping. She is comfort nursing only. We reviewed benefits of comfort nursing and possibility that as infant continues at breast her milk supply may increase.  Patient Name: Joann Meyers [id number] Date: 04/14/2022   Age:35 m.o.   Feeding Nipple Type: Dr. Myra Gianotti Preemie   [name] 04/14/2022, 4:00 PM

## 2022-05-10 ENCOUNTER — Telehealth: Payer: Self-pay | Admitting: Family Medicine

## 2022-05-10 NOTE — Telephone Encounter (Signed)
Called to setup BP Check appt but was unable to reach patient....left voicemail.

## 2022-06-01 ENCOUNTER — Ambulatory Visit: Payer: Self-pay

## 2022-06-01 ENCOUNTER — Ambulatory Visit (HOSPITAL_COMMUNITY): Admit: 2022-06-01 | Payer: Medicaid Other

## 2022-06-01 NOTE — Telephone Encounter (Signed)
  Chief Complaint: HTN Symptoms: BP 163/100, HA, blurred vision, dizziness Frequency: been out of BP meds for 1 month Pertinent Negatives:  NA Disposition: '[]'$ ED /'[x]'$ Urgent Care (no appt availability in office) / '[x]'$ Appointment(In office/virtual)/ '[]'$  [facility]/ '[]'$ Home Care/ '[]'$ Refused Recommended Disposition /'[]'$ [place]/ '[]'$  Follow-up with PCP Additional Notes: advised pt based on sx would need to go UC d/t no PCP. Pt going to go to UC now since low wait time. Also rescheduled NPA at [facility] for 08/07/22 at 0800 and added to waitlist.   Reason for Disposition  Systolic BP  >= 397 OR Diastolic >= 673  Answer Assessment - Initial Assessment Questions 1. BLOOD PRESSURE: "What is the blood pressure?" "Did you take at least two measurements 5 minutes apart?"     163/100 2. ONSET: "When did you take your blood pressure?"     Few mins ago  4. HISTORY: "Do you have a history of high blood pressure?"     yes 5. MEDICINES: "Are you taking any medicines for blood pressure?" "Have you missed any doses recently?"     Yes, been out of medication for 1 month 6. OTHER SYMPTOMS: "Do you have any symptoms?" (e.g., blurred vision, chest pain, difficulty breathing, headache, weakness)     Blurred vision, HA and dizziness.  Protocols used: Blood Pressure - High-A-AH

## 2022-06-06 ENCOUNTER — Ambulatory Visit: Payer: Medicaid Other | Admitting: Family Medicine

## 2022-06-13 ENCOUNTER — Emergency Department (HOSPITAL_COMMUNITY): Payer: Medicaid Other

## 2022-06-13 ENCOUNTER — Emergency Department (HOSPITAL_COMMUNITY)
Admission: EM | Admit: 2022-06-13 | Discharge: 2022-06-13 | Disposition: A | Discharge status: Home / Self Care | Payer: Medicaid Other | Attending: Student | Admitting: Student

## 2022-06-13 ENCOUNTER — Other Ambulatory Visit: Payer: Self-pay

## 2022-06-13 ENCOUNTER — Encounter (HOSPITAL_COMMUNITY): Payer: Self-pay | Admitting: Emergency Medicine

## 2022-06-13 DIAGNOSIS — Z5321 Procedure and treatment not carried out due to patient leaving prior to being seen by health care provider: Secondary | ICD-10-CM | POA: Insufficient documentation

## 2022-06-13 DIAGNOSIS — R200 Anesthesia of skin: Secondary | ICD-10-CM | POA: Insufficient documentation

## 2022-06-13 LAB — BASIC METABOLIC PANEL
Anion gap: 10 (ref 5–15)
BUN: 20 mg/dL (ref 6–20)
CO2: 20 mmol/L — ABNORMAL LOW (ref 22–32)
Calcium: 9.1 mg/dL (ref 8.9–10.3)
Chloride: 104 mmol/L (ref 98–111)
Creatinine, Ser: 0.85 mg/dL (ref 0.44–1.00)
GFR, Estimated: >60 mL/min (ref >60)
Glucose, Bld: 356 mg/dL — ABNORMAL HIGH (ref 70–99)
Potassium: 4 mmol/L (ref 3.5–5.1)
Sodium: 134 mmol/L — ABNORMAL LOW (ref 135–145)

## 2022-06-13 LAB — CBC
HCT: 31.3 % — ABNORMAL LOW (ref 36.0–46.0)
Hemoglobin: 10.5 g/dL — ABNORMAL LOW (ref 12.0–15.0)
MCH: 27.7 pg (ref 26.0–34.0)
MCHC: 33.5 g/dL (ref 30.0–36.0)
MCV: 82.6 fL (ref 80.0–100.0)
Platelets: 163 10*3/uL (ref 150–400)
RBC: 3.79 MIL/uL — ABNORMAL LOW (ref 3.87–5.11)
RDW: 14.7 % (ref 11.5–15.5)
WBC: 5.6 10*3/uL (ref 4.0–10.5)
nRBC: 0 % (ref 0.0–0.2)

## 2022-06-13 LAB — I-STAT BETA HCG BLOOD, ED (MC, WL, AP ONLY): I-stat hCG, quantitative: <5 m[IU]/mL (ref <5)

## 2022-06-13 LAB — TROPONIN I (HIGH SENSITIVITY): Troponin I (High Sensitivity): 9 ng/L (ref <18)

## 2022-06-13 NOTE — ED Notes (Signed)
Pt refuses to sign MSE waiver

## 2022-06-13 NOTE — ED Notes (Signed)
Pt called multiple times no answer 

## 2022-06-13 NOTE — ED Triage Notes (Signed)
L side numbness, awoke with this yesterday. No previous instances.  L chest pressure starting today starting at 10pm. 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  Normally should take xarelto, has not taken in 2 mo.

## 2022-06-19 ENCOUNTER — Other Ambulatory Visit: Payer: Self-pay

## 2022-06-19 ENCOUNTER — Encounter: Payer: Self-pay | Admitting: Family Medicine

## 2022-06-20 ENCOUNTER — Other Ambulatory Visit: Payer: Self-pay

## 2022-07-04 ENCOUNTER — Other Ambulatory Visit (HOSPITAL_COMMUNITY): Payer: Self-pay

## 2022-07-14 ENCOUNTER — Other Ambulatory Visit: Payer: Self-pay

## 2022-08-07 ENCOUNTER — Ambulatory Visit: Payer: Medicaid Other | Admitting: Family Medicine

## 2022-10-11 ENCOUNTER — Encounter (HOSPITAL_COMMUNITY): Payer: Self-pay | Admitting: Emergency Medicine

## 2022-10-11 ENCOUNTER — Ambulatory Visit (HOSPITAL_COMMUNITY)
Admission: EM | Admit: 2022-10-11 | Discharge: 2022-10-11 | Disposition: A | Discharge status: Home / Self Care | Payer: Medicaid Other | Attending: Emergency Medicine | Admitting: Emergency Medicine

## 2022-10-11 DIAGNOSIS — R0789 Other chest pain: Secondary | ICD-10-CM

## 2022-10-11 MED ORDER — PREDNISONE 20 MG PO TABS: 40.0000 mg | ORAL_TABLET | Freq: Every day | ORAL | 0 refills | Status: DC

## 2022-10-11 MED ORDER — KETOROLAC TROMETHAMINE 30 MG/ML IJ SOLN
INTRAMUSCULAR | Status: AC
  Filled 2022-10-11: qty 1

## 2022-10-11 MED ORDER — METHYLPREDNISOLONE SODIUM SUCC 125 MG IJ SOLR
INTRAMUSCULAR | Status: AC
  Filled 2022-10-11: qty 2

## 2022-10-11 MED ORDER — METHYLPREDNISOLONE SODIUM SUCC 125 MG IJ SOLR
60.0000 mg | Freq: Once | INTRAMUSCULAR | Status: AC
  Administered 2022-10-11: 60 mg via INTRAMUSCULAR

## 2022-10-11 MED ORDER — CYCLOBENZAPRINE HCL 10 MG PO TABS: 10.0000 mg | ORAL_TABLET | Freq: Two times a day (BID) | ORAL | 0 refills | Status: DC | PRN

## 2022-10-11 MED ORDER — KETOROLAC TROMETHAMINE 30 MG/ML IJ SOLN
30.0000 mg | Freq: Once | INTRAMUSCULAR | Status: AC
  Administered 2022-10-11: 30 mg via INTRAMUSCULAR

## 2022-10-11 NOTE — Discharge Instructions (Addendum)
Today you are being treated for muscular pain as I am able to recreate the chest pain by pushing on the, low suspicion that pain is being caused by your heart  EKG showed that the heart is beating in a normal pace and rhythm  You have been given an injection of Toradol and methylprednisolone in office to help calm your pain  Starting tomorrow take prednisone every morning with food to reduce inflammation and irritation to the chest wall which should continue to reduce your pain, you may take Tylenol 500 to 1000 mg every 6 hours in addition to this  You may use muscle relaxer twice daily as needed for additional comfort, be mindful this may make you drowsy  You may apply ice or heat over the affected area in 10 to 15-minute intervals  You may massage and stretch as tolerated  At any point if your symptoms worsen please follow-up for reevaluation
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
## 2022-10-11 NOTE — ED Triage Notes (Signed)
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 Pt adds that left arm is numb.

## 2022-10-26 ENCOUNTER — Ambulatory Visit: Payer: Medicaid Other | Admitting: Family Medicine

## 2022-10-26 ENCOUNTER — Encounter: Payer: Self-pay | Admitting: Family Medicine

## 2022-10-26 VITALS — BP 150/88 | HR 93 | Temp 98.1°F | Resp 16 | Ht 59.0 in | Wt 216.0 lb

## 2022-10-26 DIAGNOSIS — I10 Essential (primary) hypertension: Secondary | ICD-10-CM | POA: Diagnosis not present

## 2022-10-26 DIAGNOSIS — Z794 Long term (current) use of insulin: Secondary | ICD-10-CM

## 2022-10-26 DIAGNOSIS — E1165 Type 2 diabetes mellitus with hyperglycemia: Secondary | ICD-10-CM | POA: Diagnosis not present

## 2022-10-26 DIAGNOSIS — Z7689 Persons encountering health services in other specified circumstances: Secondary | ICD-10-CM | POA: Diagnosis not present

## 2022-10-26 LAB — POCT GLYCOSYLATED HEMOGLOBIN (HGB A1C): Hemoglobin A1C: 10.4 % — AB (ref 4.0–5.6)

## 2022-10-26 MED ORDER — METFORMIN HCL 1000 MG PO TABS: 1000.0000 mg | ORAL_TABLET | Freq: Two times a day (BID) | ORAL | 1 refills | Status: DC

## 2022-10-26 MED ORDER — LISINOPRIL 10 MG PO TABS: 10.0000 mg | ORAL_TABLET | Freq: Every day | ORAL | 1 refills | Status: DC

## 2022-10-26 NOTE — Progress Notes (Signed)
Patient is here to established care with provider today. Patient has many health concern they would like to discuss with provider today  Care gaps discuss at appointment today  

## 2022-10-30 ENCOUNTER — Encounter: Payer: Self-pay | Admitting: Family Medicine
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
## 2022-11-01 ENCOUNTER — Encounter (HOSPITAL_COMMUNITY): Payer: Self-pay

## 2022-11-01 ENCOUNTER — Other Ambulatory Visit: Payer: Self-pay

## 2022-11-01 ENCOUNTER — Ambulatory Visit (HOSPITAL_COMMUNITY)
Admission: EM | Admit: 2022-11-01 | Discharge: 2022-11-01 | Disposition: A | Discharge status: Home / Self Care | Payer: Medicaid Other | Attending: Family Medicine | Admitting: Family Medicine

## 2022-11-01 ENCOUNTER — Emergency Department (HOSPITAL_COMMUNITY)
Admission: EM | Admit: 2022-11-01 | Discharge: 2022-11-02 | Discharge status: Against Medical Advice | Payer: Medicaid Other | Attending: Emergency Medicine | Admitting: Emergency Medicine

## 2022-11-01 ENCOUNTER — Emergency Department (HOSPITAL_COMMUNITY)
Admission: EM | Admit: 2022-11-01 | Discharge: 2022-11-01 | Discharge status: Against Medical Advice | Payer: Medicaid Other | Source: Home / Self Care

## 2022-11-01 DIAGNOSIS — R112 Nausea with vomiting, unspecified: Secondary | ICD-10-CM | POA: Diagnosis not present

## 2022-11-01 DIAGNOSIS — Z5321 Procedure and treatment not carried out due to patient leaving prior to being seen by health care provider: Secondary | ICD-10-CM | POA: Diagnosis not present

## 2022-11-01 DIAGNOSIS — E1165 Type 2 diabetes mellitus with hyperglycemia: Secondary | ICD-10-CM | POA: Diagnosis not present

## 2022-11-01 DIAGNOSIS — E1169 Type 2 diabetes mellitus with other specified complication: Secondary | ICD-10-CM | POA: Diagnosis not present

## 2022-11-01 DIAGNOSIS — R1084 Generalized abdominal pain: Secondary | ICD-10-CM | POA: Diagnosis not present

## 2022-11-01 DIAGNOSIS — E119 Type 2 diabetes mellitus without complications: Secondary | ICD-10-CM | POA: Insufficient documentation

## 2022-11-01 DIAGNOSIS — R109 Unspecified abdominal pain: Secondary | ICD-10-CM | POA: Diagnosis present

## 2022-11-01 LAB — POCT URINALYSIS DIPSTICK, ED / UC
Bilirubin Urine: NEGATIVE
Glucose, UA: >=1000 mg/dL — AB
Leukocytes,Ua: NEGATIVE
Nitrite: NEGATIVE
Protein, ur: >=300 mg/dL — AB
Specific Gravity, Urine: 1.025 (ref 1.005–1.030)
Urobilinogen, UA: 0.2 mg/dL (ref 0.0–1.0)
pH: 5.5 (ref 5.0–8.0)

## 2022-11-01 LAB — URINALYSIS, ROUTINE W REFLEX MICROSCOPIC
Bilirubin Urine: NEGATIVE
Glucose, UA: >=500 mg/dL — AB
Hgb urine dipstick: NEGATIVE
Ketones, ur: NEGATIVE mg/dL
Nitrite: NEGATIVE
Protein, ur: >=300 mg/dL — AB
Specific Gravity, Urine: 1.024 (ref 1.005–1.030)
pH: 5 (ref 5.0–8.0)

## 2022-11-01 LAB — CBG MONITORING, ED
Glucose-Capillary: 329 mg/dL — ABNORMAL HIGH (ref 70–99)
Glucose-Capillary: 304 mg/dL — ABNORMAL HIGH (ref 70–99)

## 2022-11-01 LAB — POC URINE PREG, ED: Preg Test, Ur: NEGATIVE

## 2022-11-01 MED ORDER — ONDANSETRON 4 MG PO TBDP
ORAL_TABLET | ORAL | Status: AC
  Filled 2022-11-01: qty 1

## 2022-11-01 MED ORDER — ONDANSETRON 4 MG PO TBDP
4.0000 mg | ORAL_TABLET | Freq: Once | ORAL | Status: AC
  Administered 2022-11-01: 4 mg via ORAL

## 2022-11-01 NOTE — Discharge Instructions (Addendum)
D/C to ED via [facility]

## 2022-11-01 NOTE — ED Notes (Signed)
Called charge at [facility] ED and spoke with Carlis Abbott. Informed that Patient will be arriving in the ED via [place].

## 2022-11-01 NOTE — ED Triage Notes (Signed)
Chief Complaint: Patient having abdominal pain in the suprapubic area and bilateral hand numbness. Patient having nausea and vomiting.   Onset: nausea/vomiting yesterday, stomach pain x 2 days, hand numbness 1 week.   Prescriptions or OTC medications tried: Yes- ibuprofen, tylenol     with no relief  Sick exposure: No  New foods, medications, or products: Yes- metformin started 2 days ago. Has not taken today.   Recent Travel: No

## 2022-11-01 NOTE — ED Triage Notes (Signed)
Patient complains of abd pain chest pain burning with urination tingling sensatin to bilateral hands and CBG >300.  Patient came to ER and then went to [facility] and UC sent her back here. Hx of carotid artery dissection but no neuro deficits at this time per [facility].  Started metformin two days and has not taken it today.

## 2022-11-01 NOTE — ED Notes (Addendum)
[facility] called and on the way to transport the Patient to the ED.   Patient is being discharged from the Urgent Care and sent to the Emergency Department via [place] . Per [facility] PA, patient is in need of higher level of care due to possible diabetic emergency/HHS. Patient is aware and verbalizes understanding of plan of care.  Vitals:   11/01/22 1359  BP: (!) 175/93  Pulse: 97  Resp: 16  Temp: 98.1 F (36.7 C)  SpO2: 95%

## 2022-11-01 NOTE — ED Provider Triage Note (Signed)
Emergency Medicine Provider Triage Evaluation Note  Joann Meyers , a 37 y.o. female  was evaluated in triage.  Patient complaining of abdominal pain, nausea and vomiting for 2 days.  Of note she started metformin 2 days ago for diabetes.  She does not check her blood pressure at home so she does not know what it has been.  Was sent here from urgent care due to abdominal pain and uncontrolled diabetes.    Review of Systems  Positive: Abdominal pain, nausea and vomiting Negative: Tells me that she is not having any chest pain and she feels like the burning is coming from her abdomen.  Physical Exam  BP (!) 179/94 (BP Location: Left Arm)   Pulse 94   Temp 98.9 F (37.2 C) (Oral)   Resp 18   Ht '4\' 11"'$  (1.499 m)   Wt 98 kg   LMP 09/20/2022 (Approximate)   SpO2 99%   BMI 43.63 kg/m  Gen:   Awake, no distress   Resp:  Normal effort  MSK:   Moves extremities without difficulty  Other:  Generalized abdominal pain.  Medical Decision Making  Medically screening exam initiated at 4:02 PM.  Appropriate orders placed.  Abigaile Hane was informed that the remainder of the evaluation will be completed by another provider, this initial triage assessment does not replace that evaluation, and the importance of remaining in the ED until their evaluation is complete.  Potentially just a side effect of metformin.  Will get abdominal pain labs.  Nursing staff already ordered EKG and troponin   Era Parr A, PA-C 11/01/22 1605
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
## 2022-11-01 NOTE — ED Notes (Signed)
Called for repeat labs and no response

## 2022-11-02 NOTE — ED Notes (Signed)
PT hasn't answer for VS for hours . Called PT multiple time so has other staff for further testing.

## 2022-11-27 ENCOUNTER — Ambulatory Visit: Payer: Medicaid Other | Admitting: Pharmacist

## 2023-01-12 ENCOUNTER — Emergency Department (HOSPITAL_COMMUNITY)
Admission: EM | Admit: 2023-01-12 | Discharge: 2023-01-12 | Disposition: A | Discharge status: Home / Self Care | Payer: Medicaid Other | Attending: Emergency Medicine | Admitting: Emergency Medicine

## 2023-01-12 ENCOUNTER — Emergency Department (HOSPITAL_COMMUNITY): Payer: Medicaid Other

## 2023-01-12 ENCOUNTER — Other Ambulatory Visit: Payer: Self-pay

## 2023-01-12 ENCOUNTER — Encounter (HOSPITAL_COMMUNITY): Payer: Self-pay

## 2023-01-12 DIAGNOSIS — Z7982 Long term (current) use of aspirin: Secondary | ICD-10-CM | POA: Insufficient documentation

## 2023-01-12 DIAGNOSIS — D649 Anemia, unspecified: Secondary | ICD-10-CM | POA: Insufficient documentation

## 2023-01-12 DIAGNOSIS — R519 Headache, unspecified: Secondary | ICD-10-CM | POA: Diagnosis not present

## 2023-01-12 DIAGNOSIS — Z7984 Long term (current) use of oral hypoglycemic drugs: Secondary | ICD-10-CM | POA: Diagnosis not present

## 2023-01-12 DIAGNOSIS — R0789 Other chest pain: Secondary | ICD-10-CM

## 2023-01-12 DIAGNOSIS — Z794 Long term (current) use of insulin: Secondary | ICD-10-CM | POA: Diagnosis not present

## 2023-01-12 DIAGNOSIS — Z91040 Latex allergy status: Secondary | ICD-10-CM | POA: Diagnosis not present

## 2023-01-12 DIAGNOSIS — Z8673 Personal history of transient ischemic attack (TIA), and cerebral infarction without residual deficits: Secondary | ICD-10-CM | POA: Diagnosis not present

## 2023-01-12 DIAGNOSIS — Z79899 Other long term (current) drug therapy: Secondary | ICD-10-CM | POA: Diagnosis not present

## 2023-01-12 DIAGNOSIS — E119 Type 2 diabetes mellitus without complications: Secondary | ICD-10-CM | POA: Insufficient documentation

## 2023-01-12 DIAGNOSIS — H538 Other visual disturbances: Secondary | ICD-10-CM | POA: Diagnosis not present

## 2023-01-12 LAB — CBC
HCT: 28.3 % — ABNORMAL LOW (ref 36.0–46.0)
Hemoglobin: 9.8 g/dL — ABNORMAL LOW (ref 12.0–15.0)
MCH: 26.3 pg (ref 26.0–34.0)
MCHC: 34.6 g/dL (ref 30.0–36.0)
MCV: 75.9 fL — ABNORMAL LOW (ref 80.0–100.0)
Platelets: 180 10*3/uL (ref 150–400)
RBC: 3.73 MIL/uL — ABNORMAL LOW (ref 3.87–5.11)
RDW: 15.6 % — ABNORMAL HIGH (ref 11.5–15.5)
WBC: 4.7 10*3/uL (ref 4.0–10.5)
nRBC: 0 % (ref 0.0–0.2)

## 2023-01-12 LAB — BASIC METABOLIC PANEL
Anion gap: 11 (ref 5–15)
BUN: 20 mg/dL (ref 6–20)
CO2: 21 mmol/L — ABNORMAL LOW (ref 22–32)
Calcium: 8.7 mg/dL — ABNORMAL LOW (ref 8.9–10.3)
Chloride: 101 mmol/L (ref 98–111)
Creatinine, Ser: 0.74 mg/dL (ref 0.44–1.00)
GFR, Estimated: >60 mL/min (ref >60)
Glucose, Bld: 306 mg/dL — ABNORMAL HIGH (ref 70–99)
Potassium: 3.9 mmol/L (ref 3.5–5.1)
Sodium: 133 mmol/L — ABNORMAL LOW (ref 135–145)

## 2023-01-12 LAB — I-STAT BETA HCG BLOOD, ED (MC, WL, AP ONLY): I-stat hCG, quantitative: <5 m[IU]/mL (ref <5)

## 2023-01-12 LAB — TROPONIN I (HIGH SENSITIVITY)
Troponin I (High Sensitivity): 8 ng/L (ref <18)
Troponin I (High Sensitivity): 7 ng/L (ref <18)

## 2023-01-12 MED ORDER — DIPHENHYDRAMINE HCL 50 MG/ML IJ SOLN
25.0000 mg | Freq: Once | INTRAMUSCULAR | Status: AC
  Administered 2023-01-12: 25 mg via INTRAVENOUS
  Filled 2023-01-12: qty 1

## 2023-01-12 MED ORDER — KETOROLAC TROMETHAMINE 15 MG/ML IJ SOLN
15.0000 mg | Freq: Once | INTRAMUSCULAR | Status: AC
  Administered 2023-01-12: 15 mg via INTRAVENOUS
  Filled 2023-01-12: qty 1

## 2023-01-12 MED ORDER — LACTATED RINGERS IV BOLUS
1000.0000 mL | Freq: Once | INTRAVENOUS | Status: AC
  Administered 2023-01-12: 1000 mL via INTRAVENOUS

## 2023-01-12 MED ORDER — METOCLOPRAMIDE HCL 5 MG/ML IJ SOLN
10.0000 mg | Freq: Once | INTRAMUSCULAR | Status: AC
  Administered 2023-01-12: 10 mg via INTRAVENOUS
  Filled 2023-01-12: qty 2

## 2023-01-12 NOTE — ED Provider Notes (Signed)
[facility] Provider Note   CSN: [id number] Arrival date & time: 01/12/23  1404     History  Chief Complaint  Patient presents with   Chest Wall Pain    Child Bedward is a 37 y.o. female with PMH TIA, T2DM on metformin, carotid artery dissection, migraines presenting with central chest pain that began earlier today that is nonradiating and sharp.  It is worse when she takes a big breath.  She has had 1 week of cough, congestion and intermittent headache. Pt currently having HA and states it is 8/10 and she is having blurry vision with it, which is normal w/ her migraines except this pain is more pulsing.  With EMS patient got nitro as well as 324 of aspirin with no relief of her pain.  HPI     Home Medications Prior to Admission medications   Medication Sig Start Date End Date Taking? Authorizing Provider  lisinopril (ZESTRIL) 10 MG tablet Take 1 tablet (10 mg total) by mouth daily. 10/26/22   Dorna Mai, MD  metFORMIN (GLUCOPHAGE) 1000 MG tablet Take 1 tablet (1,000 mg total) by mouth 2 (two) times daily at 8 am and 10 pm. 10/26/22   Dorna Mai, MD  insulin NPH Human (NOVOLIN N) 100 UNIT/ML injection Inject 0.1 mLs (10 Units total) into the skin 2 (two) times daily. Take at breakfast and at hs 10/13/21 10/27/21  Donnamae Jude, MD  metoCLOPramide (REGLAN) 10 MG tablet Take 1 tablet (10 mg total) by mouth 4 (four) times daily as needed for nausea or vomiting. 08/04/20 01/14/21  Osborne Oman, MD  prochlorperazine (COMPAZINE) 10 MG tablet Take 1 tablet (10 mg total) by mouth 2 (two) times daily as needed for nausea or vomiting. 04/25/19 08/12/19  Tegeler, Gwenyth Allegra, MD      Allergies    Fish-derived products, Morphine and related, Shellfish allergy, Tuna [fish allergy], Feraheme [ferumoxytol], Latex, and Pork-derived products    Review of Systems   Review of Systems  Physical Exam Updated Vital Signs BP 119/71   Pulse 78    Temp 98.6 F (37 C) (Oral)   Resp 16   Ht 4\' 11"  (1.499 m)   Wt 89.8 kg   LMP 01/11/2023 (Exact Date)   SpO2 94%   BMI 39.99 kg/m  Physical Exam Vitals and nursing note reviewed.  Constitutional:      General: She is not in acute distress.    Appearance: She is well-developed.  HENT:     Head: Normocephalic and atraumatic.     Nose: Nose normal.     Mouth/Throat:     Mouth: Mucous membranes are dry.     Pharynx: Oropharynx is clear.  Eyes:     Extraocular Movements: Extraocular movements intact.     Conjunctiva/sclera: Conjunctivae normal.     Pupils: Pupils are equal, round, and reactive to light.  Cardiovascular:     Rate and Rhythm: Normal rate and regular rhythm.     Pulses: Normal pulses.     Heart sounds: Normal heart sounds. No murmur heard. Pulmonary:     Effort: Pulmonary effort is normal. No respiratory distress.     Breath sounds: Normal breath sounds.  Chest:     Chest wall: Tenderness (TTP over sternum) present.  Abdominal:     General: Abdomen is flat. There is no distension.     Palpations: Abdomen is soft.     Tenderness: There is no abdominal tenderness.  Musculoskeletal:        General: No swelling.     Cervical back: Neck supple. No tenderness.  Skin:    General: Skin is warm and dry.     Capillary Refill: Capillary refill takes less than 2 seconds.  Neurological:     Mental Status: She is alert and oriented to person, place, and time.     Cranial Nerves: No cranial nerve deficit.     Sensory: No sensory deficit.     Motor: No weakness.     Coordination: Coordination normal.  Psychiatric:        Mood and Affect: Mood normal.     ED Results / Procedures / Treatments   Labs (all labs ordered are listed, but only abnormal results are displayed) Labs Reviewed  BASIC METABOLIC PANEL - Abnormal; Notable for the following components:      Result Value   Sodium 133 (*)    CO2 21 (*)    Glucose, Bld 306 (*)    Calcium 8.7 (*)    All other  components within normal limits  CBC - Abnormal; Notable for the following components:   RBC 3.73 (*)    Hemoglobin 9.8 (*)    HCT 28.3 (*)    MCV 75.9 (*)    RDW 15.6 (*)    All other components within normal limits  I-STAT BETA HCG BLOOD, ED ([facility], [facility], [facility] ONLY)  TROPONIN I (HIGH SENSITIVITY)  TROPONIN I (HIGH SENSITIVITY)    EKG EKG Interpretation  Date/Time:  Friday January 12 2023 14:17:47 EDT Ventricular Rate:  94 PR Interval:  152 QRS Duration: 94 QT Interval:  368 QTC Calculation: 460 R Axis:   46 Text Interpretation: Normal sinus rhythm T wave abnormality, consider inferior ischemia Prolonged QT Abnormal ECG When compared with ECG of 01-Nov-2022 16:15, PREVIOUS ECG IS PRESENT Confirmed by Nanda Quinton [phone]) on 01/12/2023 5:47:32 PM  Radiology DG Chest 2 View  Result Date: 01/12/2023 CLINICAL DATA:  Chest pain. EXAM: CHEST - 2 VIEW COMPARISON:  June 13, 2022. FINDINGS: The heart size and mediastinal contours are within normal limits. Both lungs are clear. The visualized skeletal structures are unremarkable. IMPRESSION: No active cardiopulmonary disease. Electronically Signed   By: Marijo Conception M.D.   On: 01/12/2023 14:52    Procedures Procedures    Medications Ordered in ED Medications  lactated ringers bolus 1,000 mL (0 mLs Intravenous Stopped 01/12/23 1932)  ketorolac (TORADOL) 15 MG/ML injection 15 mg (15 mg Intravenous Given 01/12/23 1757)  diphenhydrAMINE (BENADRYL) injection 25 mg (25 mg Intravenous Given 01/12/23 1756)  metoCLOPramide (REGLAN) injection 10 mg (10 mg Intravenous Given 01/12/23 1758)    ED Course/ Medical Decision Making/ A&P                             Medical Decision Making Amount and/or Complexity of Data Reviewed Labs: ordered. Radiology: ordered.  Risk Prescription drug management.   On arrival, patient is hemodynamically stable and afebrile.  On exam she has reproducible central chest pain.  She has no focal abnormal lung  sounds.  Labs are overall reassuring, negative for leukocytosis or significant electrolyte abnormalities.  Patient does have anemia, but it is comparable to previous labs.  Initial and delta troponins are both WNL.  Patient is not pregnant at this time.  No focal consolidation, pulmonary edema or pleural effusion on chest x-ray.  EKG is sinus rhythm with no signs of  acute ischemia or arrhythmia at this time.  Patient was given IV fluid, Toradol, Benadryl and Reglan for her headache with relief of her symptoms.  She was cleared for discharge with scheduled Motrin to address the chest pain and Excedrin as needed for her migraines.        Final Clinical Impression(s) / ED Diagnoses Final diagnoses:  Atypical chest pain  Intractable headache, unspecified chronicity pattern, unspecified headache type     Bradd Canary, MD 01/12/23 2251    Margette Fast, MD 01/13/23 [phone]

## 2023-01-12 NOTE — Discharge Instructions (Signed)
You were given medication in the ED for your headache and also chest pain. Your labs were negative for any signs of heart attack and your EKG did not show signs of heart injury.   You should schedule motrin over the next 3-5 days and you can take excederin for your migraines as needed.   Please return if the chest pain worsens, you have trouble breathing, intractable vomiting or change in severity of your headache especially if it's with vision changes or numbness/weakness in your extremities.

## 2023-01-12 NOTE — ED Triage Notes (Signed)
Pt arrived to ED via EMS from work w/ c/o anterior chest wall pain x 1hr that is worse w/ inspiration and expiration. Pt has had a non-productive cough x 1 week. EMS gave 0.4mg  nitro and 324mg  ASA w/ no relief. VSS w/ EMS. Hx of diabetes type 1. Pt in NAD

## 2023-01-12 NOTE — ED Provider Triage Note (Signed)
Emergency Medicine Provider Triage Evaluation Note  Joann Meyers , a 37 y.o. female  was evaluated in triage.  Pt complains of  chest pain with cough.  Denies fever exertional dyspnea nausea or diaphoresis Review of Systems  Positive: cough Negative: fever  Physical Exam  BP (!) 142/84 (BP Location: Right Arm)   Pulse 93   Temp 97.8 F (36.6 C) (Oral)   Resp 17   Ht 4\' 11"  (1.499 m)   Wt 89.8 kg   LMP 01/11/2023 (Exact Date)   SpO2 99%   BMI 39.99 kg/m  Gen:   Awake, no distress   Resp:  Normal effort  MSK:   Moves extremities without difficulty  Other:   Medical Decision Making  Medically screening exam initiated at 3:14 PM.  Appropriate orders placed.  Alauna Belland was informed that the remainder of the evaluation will be completed by another provider, this initial triage assessment does not replace that evaluation, and the importance of remaining in the ED until their evaluation is complete.     Margarita Mail, PA-C 01/12/23 1523

## 2023-01-16 ENCOUNTER — Ambulatory Visit (HOSPITAL_COMMUNITY)
Admission: EM | Admit: 2023-01-16 | Discharge: 2023-01-16 | Disposition: A | Discharge status: Home / Self Care | Payer: Medicaid Other

## 2023-01-16 ENCOUNTER — Encounter (HOSPITAL_COMMUNITY): Payer: Self-pay | Admitting: Emergency Medicine

## 2023-01-16 ENCOUNTER — Other Ambulatory Visit: Payer: Self-pay

## 2023-01-16 ENCOUNTER — Emergency Department (HOSPITAL_COMMUNITY): Payer: Medicaid Other

## 2023-01-16 ENCOUNTER — Encounter (HOSPITAL_COMMUNITY): Payer: Self-pay

## 2023-01-16 ENCOUNTER — Emergency Department (HOSPITAL_COMMUNITY)
Admission: EM | Admit: 2023-01-16 | Discharge: 2023-01-17 | Disposition: A | Discharge status: Home / Self Care | Payer: Medicaid Other | Attending: Emergency Medicine | Admitting: Emergency Medicine

## 2023-01-16 DIAGNOSIS — R0789 Other chest pain: Secondary | ICD-10-CM | POA: Insufficient documentation

## 2023-01-16 DIAGNOSIS — Z1152 Encounter for screening for COVID-19: Secondary | ICD-10-CM | POA: Diagnosis not present

## 2023-01-16 DIAGNOSIS — I10 Essential (primary) hypertension: Secondary | ICD-10-CM | POA: Diagnosis not present

## 2023-01-16 DIAGNOSIS — Z794 Long term (current) use of insulin: Secondary | ICD-10-CM | POA: Diagnosis not present

## 2023-01-16 DIAGNOSIS — E119 Type 2 diabetes mellitus without complications: Secondary | ICD-10-CM | POA: Insufficient documentation

## 2023-01-16 DIAGNOSIS — R079 Chest pain, unspecified: Secondary | ICD-10-CM | POA: Diagnosis not present

## 2023-01-16 DIAGNOSIS — Z91040 Latex allergy status: Secondary | ICD-10-CM | POA: Insufficient documentation

## 2023-01-16 DIAGNOSIS — R519 Headache, unspecified: Secondary | ICD-10-CM | POA: Insufficient documentation

## 2023-01-16 DIAGNOSIS — E669 Obesity, unspecified: Secondary | ICD-10-CM | POA: Diagnosis not present

## 2023-01-16 DIAGNOSIS — R0602 Shortness of breath: Secondary | ICD-10-CM | POA: Diagnosis not present

## 2023-01-16 DIAGNOSIS — I6602 Occlusion and stenosis of left middle cerebral artery: Secondary | ICD-10-CM | POA: Diagnosis not present

## 2023-01-16 LAB — CBG MONITORING, ED: Glucose-Capillary: 173 mg/dL — ABNORMAL HIGH (ref 70–99)

## 2023-01-16 LAB — CBC
HCT: 29.8 % — ABNORMAL LOW (ref 36.0–46.0)
Hemoglobin: 9.6 g/dL — ABNORMAL LOW (ref 12.0–15.0)
MCH: 25.3 pg — ABNORMAL LOW (ref 26.0–34.0)
MCHC: 32.2 g/dL (ref 30.0–36.0)
MCV: 78.6 fL — ABNORMAL LOW (ref 80.0–100.0)
Platelets: 202 10*3/uL (ref 150–400)
RBC: 3.79 MIL/uL — ABNORMAL LOW (ref 3.87–5.11)
RDW: 15.8 % — ABNORMAL HIGH (ref 11.5–15.5)
WBC: 5 10*3/uL (ref 4.0–10.5)
nRBC: 0 % (ref 0.0–0.2)

## 2023-01-16 LAB — BASIC METABOLIC PANEL
Anion gap: 10 (ref 5–15)
BUN: 16 mg/dL (ref 6–20)
CO2: 25 mmol/L (ref 22–32)
Calcium: 9.6 mg/dL (ref 8.9–10.3)
Chloride: 100 mmol/L (ref 98–111)
Creatinine, Ser: 0.69 mg/dL (ref 0.44–1.00)
GFR, Estimated: >60 mL/min (ref >60)
Glucose, Bld: 250 mg/dL — ABNORMAL HIGH (ref 70–99)
Potassium: 4.4 mmol/L (ref 3.5–5.1)
Sodium: 135 mmol/L (ref 135–145)

## 2023-01-16 LAB — RESP PANEL BY RT-PCR (RSV, FLU A&B, COVID)  RVPGX2
Influenza A by PCR: NEGATIVE
Influenza B by PCR: NEGATIVE
Resp Syncytial Virus by PCR: NEGATIVE
SARS Coronavirus 2 by RT PCR: NEGATIVE

## 2023-01-16 LAB — TROPONIN I (HIGH SENSITIVITY)
Troponin I (High Sensitivity): 7 ng/L (ref <18)
Troponin I (High Sensitivity): 8 ng/L (ref <18)

## 2023-01-16 LAB — HCG, QUANTITATIVE, PREGNANCY: hCG, Beta Chain, Quant, S: 1 m[IU]/mL (ref <5)

## 2023-01-16 MED ORDER — ACETAMINOPHEN 500 MG PO TABS
1000.0000 mg | ORAL_TABLET | Freq: Once | ORAL | Status: AC
  Administered 2023-01-16: 1000 mg via ORAL
  Filled 2023-01-16: qty 2

## 2023-01-16 MED ORDER — NAPROXEN 250 MG PO TABS
500.0000 mg | ORAL_TABLET | Freq: Once | ORAL | Status: AC
  Administered 2023-01-16: 500 mg via ORAL
  Filled 2023-01-16: qty 2

## 2023-01-16 MED ORDER — LIDOCAINE 5 % EX PTCH
1.0000 | MEDICATED_PATCH | CUTANEOUS | Status: DC
  Administered 2023-01-16: 1 via TRANSDERMAL
  Filled 2023-01-16: qty 1

## 2023-01-16 MED ORDER — METOCLOPRAMIDE HCL 5 MG/ML IJ SOLN
10.0000 mg | Freq: Once | INTRAMUSCULAR | Status: AC
  Administered 2023-01-16: 10 mg via INTRAVENOUS
  Filled 2023-01-16: qty 2

## 2023-01-16 MED ORDER — DIPHENHYDRAMINE HCL 50 MG/ML IJ SOLN
25.0000 mg | Freq: Once | INTRAMUSCULAR | Status: AC
  Administered 2023-01-16: 25 mg via INTRAVENOUS
  Filled 2023-01-16: qty 1

## 2023-01-16 MED ORDER — LACTATED RINGERS IV BOLUS
1000.0000 mL | Freq: Once | INTRAVENOUS | Status: AC
  Administered 2023-01-16: 1000 mL via INTRAVENOUS

## 2023-01-16 NOTE — ED Triage Notes (Signed)
Pt c/o centralized chest pain ongoing for 4 days described as pressure. Endorses shob, nausea, and headache.

## 2023-01-16 NOTE — ED Provider Notes (Signed)
[facility] Provider Note   CSN: [id number] Arrival date & time: 01/16/23  1702     History  Chief Complaint  Patient presents with   Chest Pain   Headache    Joann Meyers is a 37 y.o. female.  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  The patient states that her symptoms have been ongoing for the last 4 days.  She states that 4 days ago she woke up with a headache and the chest pain.  She states that the headache is diffusely around her whole head.  She reports nausea but denies any vomiting.  She denies any numbness or weakness or vision changes.  She states that the chest pain has been constant.  She denies any shortness of breath.  She states that she did have a fever yesterday but denies any cough.  She denies any neck pain or stiffness.  She was seen in the ED when the symptoms started 4 days ago and had a normal workup but states that her symptoms have not improved.  She states that she has been taking Tylenol and Motrin for her symptoms at home.  The history is provided by the patient.  Chest Pain Associated symptoms: headache   Headache      Home Medications Prior to Admission medications   Medication Sig Start Date End Date Taking? Authorizing Provider  lisinopril (ZESTRIL) 10 MG tablet Take 1 tablet (10 mg total) by mouth daily. 10/26/22   Dorna Mai, MD  metFORMIN (GLUCOPHAGE) 1000 MG tablet Take 1 tablet (1,000 mg total) by mouth 2 (two) times daily at 8 am and 10 pm. 10/26/22   Dorna Mai, MD  insulin NPH Human (NOVOLIN N) 100 UNIT/ML injection Inject 0.1 mLs (10 Units total) into the skin 2 (two) times daily. Take at breakfast and at hs 10/13/21 10/27/21  Donnamae Jude, MD  metoCLOPramide (REGLAN) 10 MG tablet Take 1 tablet (10 mg total) by mouth 4  (four) times daily as needed for nausea or vomiting. 08/04/20 01/14/21  Osborne Oman, MD  prochlorperazine (COMPAZINE) 10 MG tablet Take 1 tablet (10 mg total) by mouth 2 (two) times daily as needed for nausea or vomiting. 04/25/19 08/12/19  Tegeler, Gwenyth Allegra, MD      Allergies    Fish-derived products, Morphine and related, Shellfish allergy, Tuna [fish allergy], Feraheme [ferumoxytol], Latex, and Pork-derived products    Review of Systems   Review of Systems  Cardiovascular:  Positive for chest pain.  Neurological:  Positive for headaches.    Physical Exam Updated Vital Signs BP (!) 131/57 (BP Location: Right Arm)   Pulse 80   Temp 98.9 F (37.2 C) (Oral)   Resp 18   Ht 4\' 11"  (1.499 m)   Wt 88.5 kg   LMP 01/11/2023 (Exact Date)   SpO2 100%   BMI 39.39 kg/m  Physical Exam Vitals and nursing note reviewed.  Constitutional:      General: She is not in acute distress.    Appearance: She is well-developed. She is obese.  HENT:     Head: Normocephalic and atraumatic.  Eyes:     Extraocular Movements: Extraocular movements intact.     Pupils: Pupils are equal, round, and reactive to light.  Cardiovascular:     Rate and Rhythm: Normal rate and regular  rhythm.     Pulses:          Radial pulses are 2+ on the right side and 2+ on the left side.     Heart sounds: Normal heart sounds.  Pulmonary:     Effort: Pulmonary effort is normal.     Breath sounds: Normal breath sounds.  Chest:     Chest wall: Tenderness (Midsternal chest wall tenderness to palpation) present.  Abdominal:     Palpations: Abdomen is soft.     Tenderness: There is no abdominal tenderness.  Musculoskeletal:        General: Normal range of motion.     Cervical back: Normal range of motion and neck supple.     Right lower leg: No edema.     Left lower leg: No edema.  Skin:    General: Skin is warm and dry.  Neurological:     General: No focal deficit present.     Mental Status: She is alert  and oriented to person, place, and time.     Cranial Nerves: No cranial nerve deficit.     Motor: No weakness.     Comments: Strength 5 out of 5 in bilateral upper and lower extremities, sensation intact in all 4 extremities, normal speech  Psychiatric:        Mood and Affect: Mood normal.        Behavior: Behavior normal.     ED Results / Procedures / Treatments   Labs (all labs ordered are listed, but only abnormal results are displayed) Labs Reviewed  BASIC METABOLIC PANEL - Abnormal; Notable for the following components:      Result Value   Glucose, Bld 250 (*)    All other components within normal limits  CBC - Abnormal; Notable for the following components:   RBC 3.79 (*)    Hemoglobin 9.6 (*)    HCT 29.8 (*)    MCV 78.6 (*)    MCH 25.3 (*)    RDW 15.8 (*)    All other components within normal limits  CBG MONITORING, ED - Abnormal; Notable for the following components:   Glucose-Capillary 173 (*)    All other components within normal limits  RESP PANEL BY RT-PCR (RSV, FLU A&B, COVID)  RVPGX2  HCG, QUANTITATIVE, PREGNANCY  I-STAT BETA HCG BLOOD, ED ([facility], [facility], [facility] ONLY)  TROPONIN I (HIGH SENSITIVITY)  TROPONIN I (HIGH SENSITIVITY)    EKG None  Radiology DG Chest 2 View  Result Date: 01/16/2023 CLINICAL DATA:  chest pain, shortness of breath EXAM: CHEST - 2 VIEW COMPARISON:  01/12/2023 FINDINGS: Lungs are clear. Heart size and mediastinal contours are within normal limits. No effusion.  No pneumothorax. Visualized bones unremarkable. IMPRESSION: No acute cardiopulmonary disease. Electronically Signed   By: Lucrezia Europe M.D.   On: 01/16/2023 18:10    Procedures Procedures    Medications Ordered in ED Medications  lidocaine (LIDODERM) 5 % 1 patch (1 patch Transdermal Patch Applied 01/16/23 1912)  naproxen (NAPROSYN) tablet 500 mg (500 mg Oral Given 01/16/23 1912)  metoCLOPramide (REGLAN) injection 10 mg (10 mg Intravenous Given 01/16/23 2236)  acetaminophen (TYLENOL) tablet  1,000 mg (1,000 mg Oral Given 01/16/23 2235)  diphenhydrAMINE (BENADRYL) injection 25 mg (25 mg Intravenous Given 01/16/23 2236)  lactated ringers bolus 1,000 mL (1,000 mLs Intravenous New Bag/Given 01/16/23 2241)    ED Course/ Medical Decision Making/ A&P Clinical Course as of 01/16/23 2340  Tue Jan 16, 2023  2335 Upon reassessment, the  patient is asleep resting in bed comfortably no acute distress.  On awakening, she states that her headache and chest pain have significantly improved.  Patient CT is pending at this time.  She was signed out to Dr. Leonette Monarch, pending CT for disposition. [VK]    Clinical Course User Index [VK] Kemper Durie, DO                             Medical Decision Making This patient presents to the ED with chief complaint(s) of headache, chest pain with pertinent past medical history of HTN, DM, CVA 2/2 carotid artery dissection which further complicates the presenting complaint. The complaint involves an extensive differential diagnosis and also carries with it a high risk of complications and morbidity.    The differential diagnosis includes tension headache, migraine headache, patient has no focal neurologic deficits making CVA less likely, headache was not sudden onset making ICH or mass effect unlikely, considering ACS, arrhythmia, anemia, pneumonia, pneumothorax, pulmonary edema, pleural effusion, viral syndrome  Additional history obtained: Additional history obtained from N/A Records reviewed Primary Care Documents and recent ED records  ED Course and Reassessment: Patient was initially evaluated by provider in triage and had EKG, labs and chest x-ray performed.  Patient's labs are within normal range including normal troponin x 2.  Chest x-ray shows no acute disease and EKG is without acute ischemic changes.  She is planned for CTA head and neck angio with her history of stroke and carotid artery dissection and worsening headaches and pregnancy test is  pending for CT imaging.  She was given naproxen by triage and will be given further headache cocktail with fluids, Benadryl, Tylenol and Reglan and will be closely reassessed.  Independent labs interpretation:  The following labs were independently interpreted: within normal range  Independent visualization of imaging: - I independently visualized the following imaging with scope of interpretation limited to determining acute life threatening conditions related to emergency care: CXR, which revealed no acute disease      Amount and/or Complexity of Data Reviewed Labs: ordered. Radiology: ordered.  Risk OTC drugs. Prescription drug management.          Final Clinical Impression(s) / ED Diagnoses Final diagnoses:  Bad headache  Atypical chest pain    Rx / DC Orders ED Discharge Orders     None         Kemper Durie, DO 01/16/23 2340

## 2023-01-16 NOTE — ED Triage Notes (Signed)
Patient c/o a headache, fever, a non productive cough with inspiration and expiration x 1 week. Patient was seen on 01/12/23 for the same at [facility] ED

## 2023-01-16 NOTE — Discharge Instructions (Signed)
You were seen in the emergency department for your headache and chest pain.  Your workup showed no signs of heart attack or stress on your heart, no signs of stroke and you tested negative for COVID and flu.  It is unclear what is causing your symptoms that you can continue to take Tylenol and Motrin as needed for pain and you should follow-up with your primary doctor in the next few days to have your symptoms rechecked.  You should return to the emergency department for significantly worsening headache or chest pains, severe shortness of breath, if you pass out, if you have numbness or weakness on one side of the body compared to the other or if you have any other new or concerning symptoms.

## 2023-01-16 NOTE — ED Notes (Signed)
Went to get patient to sign AMA form due to not wanting to follow [employer], [place] recommendation of going to ED for further evaluation and work up that unable to do so here at [facility] for severe headache, patient refusing to sign AMA form. Pt left facility.
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
## 2023-01-16 NOTE — ED Provider Triage Note (Signed)
Emergency Medicine Provider Triage Evaluation Note  Joann Meyers , a 37 y.o. female  was evaluated in triage.  Pt complains of chest pain in middle of chest w/associated shortness of breath. States feels pressurized. Nothing makes it better. Constant in pain. Endorses cough. Neighbor has [place].  Review of Systems  Positive: Chest pain Negative: N/V  Physical Exam  BP (!) 156/82   Pulse 80   Temp 98.4 F (36.9 C)   Resp 18   Ht 4\' 11"  (1.499 m)   Wt 88.5 kg   LMP 01/11/2023 (Exact Date)   SpO2 100%   BMI 39.39 kg/m  Gen:   Awake, no distress   Resp:  Normal effort  MSK:   Moves extremities without difficulty  Other:  +ttp of chest wall  Medical Decision Making  Medically screening exam initiated at 7:04 PM.  Appropriate orders placed.  Joann Meyers was informed that the remainder of the evaluation will be completed by another provider, this initial triage assessment does not replace that evaluation, and the importance of remaining in the ED until their evaluation is complete.     Osvaldo Shipper, [place] 01/16/23 1905

## 2023-01-16 NOTE — Discharge Instructions (Addendum)
Patient left the clinic against my medical advice advising her to be seen in the emergency department.

## 2023-01-17 ENCOUNTER — Emergency Department (HOSPITAL_COMMUNITY): Payer: Medicaid Other

## 2023-01-17 DIAGNOSIS — I6602 Occlusion and stenosis of left middle cerebral artery: Secondary | ICD-10-CM | POA: Diagnosis not present

## 2023-01-17 MED ORDER — IOHEXOL 350 MG/ML SOLN
75.0000 mL | Freq: Once | INTRAVENOUS | Status: AC | PRN
  Administered 2023-01-17: 75 mL via INTRAVENOUS

## 2023-01-17 NOTE — ED Provider Notes (Signed)
I assumed care of this patient.  Please see previous provider's note for further details of history, exam, and MDM.  Briefly patient is a 37 y.o. female who presented for chest and headache.  Cardiac workup was reportedly reassuring.  Currently pending CTA of the head and neck given her history of right ICA dissection.  Treated for migraine.  On my assessment, patient is resting comfortably.  Reports that her headache and chest pain have completely resolved.  2:05 AM CTA negative for any acute changes.  Stable and unchanged right ICA occlusion.  The patient appears reasonably screened and/or stabilized for discharge and I doubt any other medical condition or other [facility] requiring further screening, evaluation, or treatment in the ED at this time. I have discussed the findings, Dx and Tx plan with the patient/family who expressed understanding and agree(s) with the plan. Discharge instructions discussed at length. The patient/family was given strict return precautions who verbalized understanding of the instructions. No further questions at time of discharge.  Disposition: Discharge  Condition: Good  ED Discharge Orders     None         Follow Up: Dorna Mai, [address] [place] [address] [phone]  Call in 3 days          Fatima Blank, MD 01/17/23 [phone]

## 2023-01-25 ENCOUNTER — Ambulatory Visit: Payer: Self-pay | Admitting: Family Medicine

## 2023-02-07 DIAGNOSIS — I10 Essential (primary) hypertension: Secondary | ICD-10-CM | POA: Diagnosis not present

## 2023-02-07 DIAGNOSIS — R0789 Other chest pain: Secondary | ICD-10-CM | POA: Diagnosis not present

## 2023-02-07 DIAGNOSIS — R000 Tachycardia, unspecified: Secondary | ICD-10-CM | POA: Diagnosis not present

## 2023-02-07 DIAGNOSIS — R739 Hyperglycemia, unspecified: Secondary | ICD-10-CM | POA: Diagnosis not present

## 2023-02-19 ENCOUNTER — Encounter: Payer: Self-pay | Admitting: Family Medicine

## 2023-02-19 ENCOUNTER — Ambulatory Visit (INDEPENDENT_AMBULATORY_CARE_PROVIDER_SITE_OTHER): Payer: 59 | Admitting: Family Medicine

## 2023-02-19 VITALS — BP 136/84 | HR 87 | Temp 98.1°F | Resp 16 | Wt 206.1 lb

## 2023-02-19 DIAGNOSIS — Z7984 Long term (current) use of oral hypoglycemic drugs: Secondary | ICD-10-CM | POA: Diagnosis not present

## 2023-02-19 DIAGNOSIS — I10 Essential (primary) hypertension: Secondary | ICD-10-CM

## 2023-02-19 DIAGNOSIS — E1165 Type 2 diabetes mellitus with hyperglycemia: Secondary | ICD-10-CM

## 2023-02-19 DIAGNOSIS — Z794 Long term (current) use of insulin: Secondary | ICD-10-CM

## 2023-02-19 LAB — POCT GLYCOSYLATED HEMOGLOBIN (HGB A1C): Hemoglobin A1C: 9.5 % — AB (ref 4.0–5.6)

## 2023-02-19 MED ORDER — GLIPIZIDE 5 MG PO TABS
5.0000 mg | ORAL_TABLET | Freq: Two times a day (BID) | ORAL | 2 refills | Status: DC
Start: 2023-02-19 — End: 2023-03-06

## 2023-02-20 ENCOUNTER — Ambulatory Visit: Payer: Medicaid Other | Admitting: Pharmacist
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
## 2023-03-04 ENCOUNTER — Other Ambulatory Visit: Payer: Self-pay

## 2023-03-04 ENCOUNTER — Encounter (HOSPITAL_COMMUNITY): Payer: Self-pay | Admitting: Emergency Medicine

## 2023-03-04 ENCOUNTER — Inpatient Hospital Stay (HOSPITAL_COMMUNITY)
Admission: EM | Admit: 2023-03-04 | Discharge: 2023-03-06 | DRG: 322 | Disposition: A | Discharge status: Home / Self Care | Payer: 59 | Attending: Cardiovascular Disease | Admitting: Cardiovascular Disease

## 2023-03-04 DIAGNOSIS — E119 Type 2 diabetes mellitus without complications: Secondary | ICD-10-CM

## 2023-03-04 DIAGNOSIS — R569 Unspecified convulsions: Secondary | ICD-10-CM

## 2023-03-04 DIAGNOSIS — I252 Old myocardial infarction: Secondary | ICD-10-CM

## 2023-03-04 DIAGNOSIS — G40909 Epilepsy, unspecified, not intractable, without status epilepticus: Secondary | ICD-10-CM | POA: Diagnosis present

## 2023-03-04 DIAGNOSIS — Z955 Presence of coronary angioplasty implant and graft: Secondary | ICD-10-CM

## 2023-03-04 DIAGNOSIS — I2510 Atherosclerotic heart disease of native coronary artery without angina pectoris: Secondary | ICD-10-CM | POA: Diagnosis present

## 2023-03-04 DIAGNOSIS — E1165 Type 2 diabetes mellitus with hyperglycemia: Secondary | ICD-10-CM | POA: Diagnosis present

## 2023-03-04 DIAGNOSIS — D649 Anemia, unspecified: Secondary | ICD-10-CM | POA: Diagnosis present

## 2023-03-04 DIAGNOSIS — I700 Atherosclerosis of aorta: Secondary | ICD-10-CM | POA: Diagnosis not present

## 2023-03-04 DIAGNOSIS — Z79899 Other long term (current) drug therapy: Secondary | ICD-10-CM

## 2023-03-04 DIAGNOSIS — I214 Non-ST elevation (NSTEMI) myocardial infarction: Secondary | ICD-10-CM | POA: Diagnosis not present

## 2023-03-04 DIAGNOSIS — I10 Essential (primary) hypertension: Secondary | ICD-10-CM | POA: Diagnosis present

## 2023-03-04 DIAGNOSIS — Z8673 Personal history of transient ischemic attack (TIA), and cerebral infarction without residual deficits: Secondary | ICD-10-CM

## 2023-03-04 DIAGNOSIS — Z7984 Long term (current) use of oral hypoglycemic drugs: Secondary | ICD-10-CM

## 2023-03-04 DIAGNOSIS — I7771 Dissection of carotid artery: Secondary | ICD-10-CM | POA: Diagnosis present

## 2023-03-04 DIAGNOSIS — Z8679 Personal history of other diseases of the circulatory system: Secondary | ICD-10-CM

## 2023-03-04 NOTE — ED Triage Notes (Signed)
Pt reports chest pain that has been ongoing.  She reports taking a muscle relaxer (from her brother), tylenol, Ibprofen and Advil prior to coming tonight.  RN educated that Ibprofen and Advil are the same thing.  Pt states she was told that her heart "is inflamed."  "It feels like my heart is on fire.... the pain leads to panic attacks"

## 2023-03-05 ENCOUNTER — Emergency Department (HOSPITAL_COMMUNITY): Payer: 59

## 2023-03-05 ENCOUNTER — Inpatient Hospital Stay (HOSPITAL_COMMUNITY): Payer: 59

## 2023-03-05 ENCOUNTER — Encounter (HOSPITAL_COMMUNITY)
Admission: EM | Disposition: A | Discharge status: Home / Self Care | Payer: Self-pay | Source: Home / Self Care | Attending: Cardiovascular Disease

## 2023-03-05 DIAGNOSIS — I252 Old myocardial infarction: Secondary | ICD-10-CM | POA: Diagnosis not present

## 2023-03-05 DIAGNOSIS — G40909 Epilepsy, unspecified, not intractable, without status epilepticus: Secondary | ICD-10-CM | POA: Diagnosis not present

## 2023-03-05 DIAGNOSIS — R9431 Abnormal electrocardiogram [ECG] [EKG]: Secondary | ICD-10-CM | POA: Diagnosis not present

## 2023-03-05 DIAGNOSIS — Z8673 Personal history of transient ischemic attack (TIA), and cerebral infarction without residual deficits: Secondary | ICD-10-CM | POA: Diagnosis not present

## 2023-03-05 DIAGNOSIS — Z79899 Other long term (current) drug therapy: Secondary | ICD-10-CM | POA: Diagnosis not present

## 2023-03-05 DIAGNOSIS — I214 Non-ST elevation (NSTEMI) myocardial infarction: Principal | ICD-10-CM | POA: Diagnosis present

## 2023-03-05 DIAGNOSIS — I700 Atherosclerosis of aorta: Secondary | ICD-10-CM | POA: Diagnosis not present

## 2023-03-05 DIAGNOSIS — D649 Anemia, unspecified: Secondary | ICD-10-CM | POA: Diagnosis not present

## 2023-03-05 DIAGNOSIS — I2510 Atherosclerotic heart disease of native coronary artery without angina pectoris: Secondary | ICD-10-CM | POA: Diagnosis not present

## 2023-03-05 DIAGNOSIS — I10 Essential (primary) hypertension: Secondary | ICD-10-CM | POA: Diagnosis not present

## 2023-03-05 DIAGNOSIS — E1165 Type 2 diabetes mellitus with hyperglycemia: Secondary | ICD-10-CM | POA: Diagnosis not present

## 2023-03-05 DIAGNOSIS — Z8679 Personal history of other diseases of the circulatory system: Secondary | ICD-10-CM | POA: Diagnosis not present

## 2023-03-05 DIAGNOSIS — Z7984 Long term (current) use of oral hypoglycemic drugs: Secondary | ICD-10-CM | POA: Diagnosis not present

## 2023-03-05 HISTORY — PX: CORONARY STENT INTERVENTION: CATH118234

## 2023-03-05 HISTORY — PX: LEFT HEART CATH AND CORONARY ANGIOGRAPHY: CATH118249

## 2023-03-05 HISTORY — PX: CORONARY PRESSURE/FFR STUDY: CATH118243

## 2023-03-05 LAB — ECHOCARDIOGRAM COMPLETE
AR max vel: 2.88 cm2
AV Area VTI: 2.92 cm2
AV Area mean vel: 2.94 cm2
AV Mean grad: 5 mmHg
AV Peak grad: 10.4 mmHg
AV Vena cont: 0.6 cm
Ao pk vel: 1.61 m/s
Area-P 1/2: 4.68 cm2
Height: 62 in
P 1/2 time: 890 msec
S' Lateral: 3.7 cm
Weight: 3168 oz

## 2023-03-05 LAB — BASIC METABOLIC PANEL
Anion gap: 10 (ref 5–15)
BUN: 15 mg/dL (ref 6–20)
CO2: 21 mmol/L — ABNORMAL LOW (ref 22–32)
Calcium: 8.7 mg/dL — ABNORMAL LOW (ref 8.9–10.3)
Chloride: 102 mmol/L (ref 98–111)
Creatinine, Ser: 0.73 mg/dL (ref 0.44–1.00)
GFR, Estimated: >60 mL/min (ref >60)
Glucose, Bld: 356 mg/dL — ABNORMAL HIGH (ref 70–99)
Potassium: 4.1 mmol/L (ref 3.5–5.1)
Sodium: 133 mmol/L — ABNORMAL LOW (ref 135–145)

## 2023-03-05 LAB — CBC
HCT: 28.5 % — ABNORMAL LOW (ref 36.0–46.0)
Hemoglobin: 9.4 g/dL — ABNORMAL LOW (ref 12.0–15.0)
MCH: 26.3 pg (ref 26.0–34.0)
MCHC: 33 g/dL (ref 30.0–36.0)
MCV: 79.6 fL — ABNORMAL LOW (ref 80.0–100.0)
Platelets: 182 10*3/uL (ref 150–400)
RBC: 3.58 MIL/uL — ABNORMAL LOW (ref 3.87–5.11)
RDW: 16 % — ABNORMAL HIGH (ref 11.5–15.5)
WBC: 4.6 10*3/uL (ref 4.0–10.5)
nRBC: 0 % (ref 0.0–0.2)

## 2023-03-05 LAB — HEPARIN LEVEL (UNFRACTIONATED): Heparin Unfractionated: <0.1 IU/mL — ABNORMAL LOW (ref 0.30–0.70)

## 2023-03-05 LAB — HIV ANTIBODY (ROUTINE TESTING W REFLEX): HIV Screen 4th Generation wRfx: NONREACTIVE

## 2023-03-05 LAB — TROPONIN I (HIGH SENSITIVITY)
Troponin I (High Sensitivity): 209 ng/L (ref <18)
Troponin I (High Sensitivity): 233 ng/L (ref <18)

## 2023-03-05 LAB — GLUCOSE, CAPILLARY
Glucose-Capillary: 148 mg/dL — ABNORMAL HIGH (ref 70–99)
Glucose-Capillary: 229 mg/dL — ABNORMAL HIGH (ref 70–99)

## 2023-03-05 LAB — POCT ACTIVATED CLOTTING TIME
Activated Clotting Time: 239 seconds
Activated Clotting Time: 266 seconds
Activated Clotting Time: 282 seconds
Activated Clotting Time: 309 seconds

## 2023-03-05 LAB — I-STAT BETA HCG BLOOD, ED (MC, WL, AP ONLY): I-stat hCG, quantitative: <5 m[IU]/mL (ref <5)

## 2023-03-05 LAB — CBG MONITORING, ED
Glucose-Capillary: 174 mg/dL — ABNORMAL HIGH (ref 70–99)
Glucose-Capillary: 175 mg/dL — ABNORMAL HIGH (ref 70–99)

## 2023-03-05 SURGERY — LEFT HEART CATH AND CORONARY ANGIOGRAPHY
Anesthesia: LOCAL

## 2023-03-05 MED ORDER — ONDANSETRON HCL 4 MG/2ML IJ SOLN: 4.0000 mg | Freq: Four times a day (QID) | INTRAMUSCULAR | Status: DC | PRN

## 2023-03-05 MED ORDER — IOHEXOL 350 MG/ML SOLN
75.0000 mL | Freq: Once | INTRAVENOUS | Status: AC | PRN
  Administered 2023-03-05: 75 mL via INTRAVENOUS

## 2023-03-05 MED ORDER — NITROGLYCERIN 0.4 MG SL SUBL: 0.4000 mg | SUBLINGUAL_TABLET | SUBLINGUAL | Status: DC | PRN

## 2023-03-05 MED ORDER — SODIUM CHLORIDE 0.9 % WEIGHT BASED INFUSION: 1.0000 mL/kg/h | INTRAVENOUS | Status: DC

## 2023-03-05 MED ORDER — HEPARIN (PORCINE) IN NACL 1000-0.9 UT/500ML-% IV SOLN
INTRAVENOUS | Status: DC | PRN
  Administered 2023-03-05 (×2): 500 mL

## 2023-03-05 MED ORDER — FENTANYL CITRATE (PF) 100 MCG/2ML IJ SOLN
INTRAMUSCULAR | Status: AC
  Filled 2023-03-05: qty 2

## 2023-03-05 MED ORDER — METOPROLOL TARTRATE 12.5 MG HALF TABLET
12.5000 mg | ORAL_TABLET | Freq: Two times a day (BID) | ORAL | Status: DC
  Administered 2023-03-05 – 2023-03-06 (×3): 12.5 mg via ORAL
  Filled 2023-03-05 (×3): qty 1

## 2023-03-05 MED ORDER — SODIUM CHLORIDE 0.9% FLUSH: 3.0000 mL | INTRAVENOUS | Status: DC | PRN

## 2023-03-05 MED ORDER — MIDAZOLAM HCL 2 MG/2ML IJ SOLN
INTRAMUSCULAR | Status: DC | PRN
  Administered 2023-03-05: 1 mg via INTRAVENOUS

## 2023-03-05 MED ORDER — ATORVASTATIN CALCIUM 80 MG PO TABS
80.0000 mg | ORAL_TABLET | Freq: Every day | ORAL | Status: DC
  Administered 2023-03-05 – 2023-03-06 (×2): 80 mg via ORAL
  Filled 2023-03-05: qty 2
  Filled 2023-03-05: qty 1

## 2023-03-05 MED ORDER — ONDANSETRON HCL 4 MG/2ML IJ SOLN
INTRAMUSCULAR | Status: AC
  Filled 2023-03-05: qty 2

## 2023-03-05 MED ORDER — NITROGLYCERIN 0.4 MG SL SUBL
0.4000 mg | SUBLINGUAL_TABLET | SUBLINGUAL | Status: DC | PRN
  Administered 2023-03-05: 0.4 mg via SUBLINGUAL
  Filled 2023-03-05: qty 1

## 2023-03-05 MED ORDER — LABETALOL HCL 5 MG/ML IV SOLN: 10.0000 mg | Freq: Once | INTRAVENOUS | Status: DC

## 2023-03-05 MED ORDER — SODIUM CHLORIDE 0.9 % WEIGHT BASED INFUSION: 3.0000 mL/kg/h | INTRAVENOUS | Status: DC

## 2023-03-05 MED ORDER — HEPARIN SODIUM (PORCINE) 1000 UNIT/ML IJ SOLN
INTRAMUSCULAR | Status: AC
  Filled 2023-03-05: qty 10

## 2023-03-05 MED ORDER — MIDAZOLAM HCL 2 MG/2ML IJ SOLN
INTRAMUSCULAR | Status: AC
  Filled 2023-03-05: qty 2

## 2023-03-05 MED ORDER — FENTANYL CITRATE (PF) 100 MCG/2ML IJ SOLN: 25.0000 ug | Freq: Once | INTRAMUSCULAR | Status: DC

## 2023-03-05 MED ORDER — TICAGRELOR 90 MG PO TABS
ORAL_TABLET | ORAL | Status: AC
  Filled 2023-03-05: qty 2

## 2023-03-05 MED ORDER — LIDOCAINE HCL (PF) 1 % IJ SOLN
INTRAMUSCULAR | Status: AC
  Filled 2023-03-05: qty 30

## 2023-03-05 MED ORDER — HEPARIN SODIUM (PORCINE) 1000 UNIT/ML IJ SOLN
INTRAMUSCULAR | Status: DC | PRN
  Administered 2023-03-05: 4500 [IU] via INTRAVENOUS
  Administered 2023-03-05: 2000 [IU] via INTRAVENOUS
  Administered 2023-03-05: 4000 [IU] via INTRAVENOUS
  Administered 2023-03-05: 3000 [IU] via INTRAVENOUS

## 2023-03-05 MED ORDER — FENTANYL CITRATE (PF) 100 MCG/2ML IJ SOLN
INTRAMUSCULAR | Status: AC
  Administered 2023-03-05: 25 ug
  Filled 2023-03-05: qty 2

## 2023-03-05 MED ORDER — ACETAMINOPHEN 325 MG PO TABS
650.0000 mg | ORAL_TABLET | ORAL | Status: DC | PRN
  Administered 2023-03-05 – 2023-03-06 (×3): 650 mg via ORAL
  Filled 2023-03-05 (×3): qty 2

## 2023-03-05 MED ORDER — HYDRALAZINE HCL 20 MG/ML IJ SOLN: 10.0000 mg | INTRAMUSCULAR | Status: AC | PRN

## 2023-03-05 MED ORDER — LIDOCAINE HCL (PF) 1 % IJ SOLN
INTRAMUSCULAR | Status: DC | PRN
  Administered 2023-03-05: 2 mL

## 2023-03-05 MED ORDER — ASPIRIN 81 MG PO CHEW
324.0000 mg | CHEWABLE_TABLET | Freq: Once | ORAL | Status: AC
  Administered 2023-03-05: 324 mg via ORAL
  Filled 2023-03-05: qty 4

## 2023-03-05 MED ORDER — SODIUM CHLORIDE 0.9% FLUSH
3.0000 mL | Freq: Two times a day (BID) | INTRAVENOUS | Status: DC
  Administered 2023-03-05 – 2023-03-06 (×2): 3 mL via INTRAVENOUS

## 2023-03-05 MED ORDER — HEPARIN (PORCINE) 25000 UT/250ML-% IV SOLN
1250.0000 [IU]/h | INTRAVENOUS | Status: DC
  Administered 2023-03-05: 1000 [IU]/h via INTRAVENOUS
  Filled 2023-03-05: qty 250

## 2023-03-05 MED ORDER — FENTANYL CITRATE (PF) 100 MCG/2ML IJ SOLN
INTRAMUSCULAR | Status: DC | PRN
  Administered 2023-03-05: 50 ug via INTRAVENOUS

## 2023-03-05 MED ORDER — NITROGLYCERIN 1 MG/10 ML FOR IR/CATH LAB
INTRA_ARTERIAL | Status: AC
  Filled 2023-03-05: qty 10

## 2023-03-05 MED ORDER — FENTANYL CITRATE PF 50 MCG/ML IJ SOSY
25.0000 ug | PREFILLED_SYRINGE | Freq: Once | INTRAMUSCULAR | Status: AC
  Administered 2023-03-05: 25 ug via INTRAVENOUS
  Filled 2023-03-05: qty 1

## 2023-03-05 MED ORDER — ENOXAPARIN SODIUM 40 MG/0.4ML IJ SOSY
40.0000 mg | PREFILLED_SYRINGE | INTRAMUSCULAR | Status: DC
  Administered 2023-03-06: 40 mg via SUBCUTANEOUS
  Filled 2023-03-05: qty 0.4

## 2023-03-05 MED ORDER — NITROGLYCERIN 2 % TD OINT
1.0000 [in_us] | TOPICAL_OINTMENT | Freq: Once | TRANSDERMAL | Status: AC
  Administered 2023-03-05: 1 [in_us] via TOPICAL
  Filled 2023-03-05: qty 1

## 2023-03-05 MED ORDER — NITROGLYCERIN 1 MG/10 ML FOR IR/CATH LAB
INTRA_ARTERIAL | Status: DC | PRN
  Administered 2023-03-05 (×3): 200 ug via INTRACORONARY

## 2023-03-05 MED ORDER — ASPIRIN 81 MG PO TBEC
81.0000 mg | DELAYED_RELEASE_TABLET | Freq: Every day | ORAL | Status: DC
  Administered 2023-03-06: 81 mg via ORAL
  Filled 2023-03-05: qty 1

## 2023-03-05 MED ORDER — IOHEXOL 350 MG/ML SOLN
INTRAVENOUS | Status: DC | PRN
  Administered 2023-03-05: 175 mL

## 2023-03-05 MED ORDER — ONDANSETRON HCL 4 MG/2ML IJ SOLN
INTRAMUSCULAR | Status: DC | PRN
  Administered 2023-03-05: 4 mg via INTRAVENOUS

## 2023-03-05 MED ORDER — TICAGRELOR 90 MG PO TABS
90.0000 mg | ORAL_TABLET | Freq: Two times a day (BID) | ORAL | Status: DC
  Administered 2023-03-06: 90 mg via ORAL
  Filled 2023-03-05: qty 1

## 2023-03-05 MED ORDER — LABETALOL HCL 5 MG/ML IV SOLN
10.0000 mg | INTRAVENOUS | Status: AC | PRN
  Administered 2023-03-05: 10 mg via INTRAVENOUS

## 2023-03-05 MED ORDER — INSULIN ASPART 100 UNIT/ML IJ SOLN
0.0000 [IU] | Freq: Three times a day (TID) | INTRAMUSCULAR | Status: DC
  Administered 2023-03-05 (×2): 2 [IU] via SUBCUTANEOUS
  Administered 2023-03-06: 5 [IU] via SUBCUTANEOUS
  Administered 2023-03-06: 3 [IU] via SUBCUTANEOUS
  Administered 2023-03-06: 5 [IU] via SUBCUTANEOUS

## 2023-03-05 MED ORDER — HEPARIN BOLUS VIA INFUSION
4000.0000 [IU] | Freq: Once | INTRAVENOUS | Status: AC
  Administered 2023-03-05: 4000 [IU] via INTRAVENOUS
  Filled 2023-03-05: qty 4000

## 2023-03-05 MED ORDER — TICAGRELOR 90 MG PO TABS
ORAL_TABLET | ORAL | Status: DC | PRN
  Administered 2023-03-05: 180 mg via ORAL

## 2023-03-05 MED ORDER — LABETALOL HCL 5 MG/ML IV SOLN
INTRAVENOUS | Status: AC
  Filled 2023-03-05: qty 4

## 2023-03-05 MED ORDER — SODIUM CHLORIDE 0.9 % IV SOLN: INTRAVENOUS | Status: AC

## 2023-03-05 MED ORDER — VERAPAMIL HCL 2.5 MG/ML IV SOLN
INTRAVENOUS | Status: DC | PRN
  Administered 2023-03-05: 10 mL via INTRA_ARTERIAL

## 2023-03-05 MED ORDER — SODIUM CHLORIDE 0.9 % IV SOLN: 250.0000 mL | INTRAVENOUS | Status: DC | PRN

## 2023-03-05 SURGICAL SUPPLY — 26 items
BALL SAPPHIRE NC24 2.75X18 (BALLOONS) ×1
BALLN EMERGE MR 1.5X12 (BALLOONS) ×1
BALLN EMERGE MR 2.25X15 (BALLOONS) ×1
BALLN SAPPHIRE 2.0X10 (BALLOONS) ×1
BALLN ~~LOC~~ EMERGE MR 2.5X8 (BALLOONS) ×1
BALLOON EMERGE MR 1.5X12 (BALLOONS) IMPLANT
BALLOON EMERGE MR 2.25X15 (BALLOONS) IMPLANT
BALLOON SAPPHIRE 2.0X10 (BALLOONS) IMPLANT
BALLOON SAPPHIRE NC24 2.75X18 (BALLOONS) IMPLANT
BALLOON ~~LOC~~ EMERGE MR 2.5X8 (BALLOONS) IMPLANT
CATH INFINITI 5FR MULTPACK ANG (CATHETERS) IMPLANT
CATH LAUNCHER 6FR EBU3.5 (CATHETERS) IMPLANT
DEVICE RAD TR BAND REGULAR (VASCULAR PRODUCTS) IMPLANT
GLIDESHEATH SLEND SS 6F .021 (SHEATH) IMPLANT
GUIDEWIRE INQWIRE 1.5J.035X260 (WIRE) IMPLANT
GUIDEWIRE PRESSURE X 175 (WIRE) IMPLANT
INQWIRE 1.5J .035X260CM (WIRE) ×1
KIT HEART LEFT (KITS) ×2 IMPLANT
PACK CARDIAC CATHETERIZATION (CUSTOM PROCEDURE TRAY) ×2 IMPLANT
STENT SYNERGY XD 2.25X12 (Permanent Stent) IMPLANT
STENT SYNERGY XD 2.50X32 (Permanent Stent) IMPLANT
SYNERGY XD 2.25X12 (Permanent Stent) ×1 IMPLANT
SYNERGY XD 2.50X32 (Permanent Stent) ×1 IMPLANT
TRANSDUCER W/STOPCOCK (MISCELLANEOUS) ×2 IMPLANT
TUBING CIL FLEX 10 FLL-RA (TUBING) ×2 IMPLANT
WIRE RUNTHROUGH .014X180CM (WIRE) IMPLANT
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
## 2023-03-05 NOTE — ED Provider Notes (Addendum)
[place] [facility]MENT AT [facility] Provider Note   CSN: [id number] Arrival date & time: 03/04/23  2302     History  Chief Complaint  Patient presents with   Chest Pain    Joann Meyers is a 37 y.o. female.  Patient presents to the urgency department for evaluation of chest pain.  Patient reports that the pain has been ongoing for a couple of months.  She reports that she has been seen in the ED for this previously.  Pain worsened today, but she took ibuprofen, Advil, Tylenol and Robaxin and the pain has improved.       Home Medications Prior to Admission medications   Medication Sig Start Date End Date Taking? Authorizing Provider  glipiZIDE (GLUCOTROL) 5 MG tablet Take 1 tablet (5 mg total) by mouth 2 (two) times daily before a meal. 02/19/23   Georganna Skeans, MD  lisinopril (ZESTRIL) 10 MG tablet Take 1 tablet (10 mg total) by mouth daily. 10/26/22   Georganna Skeans, MD  metFORMIN (GLUCOPHAGE) 1000 MG tablet Take 1 tablet (1,000 mg total) by mouth 2 (two) times daily at 8 am and 10 pm. 10/26/22   Georganna Skeans, MD  insulin NPH Human (NOVOLIN N) 100 UNIT/ML injection Inject 0.1 mLs (10 Units total) into the skin 2 (two) times daily. Take at breakfast and at hs 10/13/21 10/27/21  Reva Bores, MD  metoCLOPramide (REGLAN) 10 MG tablet Take 1 tablet (10 mg total) by mouth 4 (four) times daily as needed for nausea or vomiting. 08/04/20 01/14/21  Tereso Newcomer, MD  prochlorperazine (COMPAZINE) 10 MG tablet Take 1 tablet (10 mg total) by mouth 2 (two) times daily as needed for nausea or vomiting. 04/25/19 08/12/19  Tegeler, Canary Brim, MD      Allergies    Fish-derived products, Morphine and codeine, Shellfish allergy, Tuna [fish allergy], Feraheme [ferumoxytol], Latex, and Pork-derived products    Review of Systems   Review of Systems  Physical Exam Updated Vital Signs BP (!) 192/115   Pulse 85   Temp 98.2 F (36.8 C)   Resp (!) 25   Ht 5\' 2"  (1.575  m)   Wt 89.8 kg   SpO2 94%   BMI 36.21 kg/m  Physical Exam Vitals and nursing note reviewed.  Constitutional:      General: She is not in acute distress.    Appearance: She is well-developed.  HENT:     Head: Normocephalic and atraumatic.     Mouth/Throat:     Mouth: Mucous membranes are moist.  Eyes:     General: Vision grossly intact. Gaze aligned appropriately.     Extraocular Movements: Extraocular movements intact.     Conjunctiva/sclera: Conjunctivae normal.  Cardiovascular:     Rate and Rhythm: Normal rate and regular rhythm.     Pulses: Normal pulses.     Heart sounds: Normal heart sounds, S1 normal and S2 normal. No murmur heard.    No friction rub. No gallop.  Pulmonary:     Effort: Pulmonary effort is normal. No respiratory distress.     Breath sounds: Normal breath sounds.  Abdominal:     General: Bowel sounds are normal.     Palpations: Abdomen is soft.     Tenderness: There is no abdominal tenderness. There is no guarding or rebound.     Hernia: No hernia is present.  Musculoskeletal:        General: No swelling.     Cervical back: Full passive  range of motion without pain, normal range of motion and neck supple. No spinous process tenderness or muscular tenderness. Normal range of motion.     Right lower leg: No edema.     Left lower leg: No edema.  Skin:    General: Skin is warm and dry.     Capillary Refill: Capillary refill takes less than 2 seconds.     Findings: No ecchymosis, erythema, rash or wound.  Neurological:     General: No focal deficit present.     Mental Status: She is alert and oriented to person, place, and time.     GCS: GCS eye subscore is 4. GCS verbal subscore is 5. GCS motor subscore is 6.     Cranial Nerves: Cranial nerves 2-12 are intact.     Sensory: Sensation is intact.     Motor: Motor function is intact.     Coordination: Coordination is intact.  Psychiatric:        Attention and Perception: Attention normal.        Mood  and Affect: Mood normal.        Speech: Speech normal.        Behavior: Behavior normal.     ED Results / Procedures / Treatments   Labs (all labs ordered are listed, but only abnormal results are displayed) Labs Reviewed  BASIC METABOLIC PANEL - Abnormal; Notable for the following components:      Result Value   Sodium 133 (*)    CO2 21 (*)    Glucose, Bld 356 (*)    Calcium 8.7 (*)    All other components within normal limits  CBC - Abnormal; Notable for the following components:   RBC 3.58 (*)    Hemoglobin 9.4 (*)    HCT 28.5 (*)    MCV 79.6 (*)    RDW 16.0 (*)    All other components within normal limits  TROPONIN I (HIGH SENSITIVITY) - Abnormal; Notable for the following components:   Troponin I (High Sensitivity) 209 (*)    All other components within normal limits  TROPONIN I (HIGH SENSITIVITY) - Abnormal; Notable for the following components:   Troponin I (High Sensitivity) 233 (*)    All other components within normal limits  HEPARIN LEVEL (UNFRACTIONATED)  I-STAT BETA HCG BLOOD, ED ([facility], [facility], [facility] ONLY)    EKG EKG Interpretation  Date/Time:  Sunday Mar 04 2023 23:24:46 EDT Ventricular Rate:  86 PR Interval:  146 QRS Duration: 98 QT Interval:  374 QTC Calculation: 447 R Axis:   20 Text Interpretation: Normal sinus rhythm Incomplete right bundle branch block Borderline ECG When compared with ECG of 16-Jan-2023 16:53, No significant change was found Confirmed by Gilda Crease [id number]) on 03/05/2023 1:02:38 AM  Radiology CT ANGIO CHEST/ABD/PEL FOR DISSECTION W &/OR WO CONTRAST  Result Date: 03/05/2023 CLINICAL DATA:  Acute aortic syndrome suspected. EXAM: CT ANGIOGRAPHY CHEST, ABDOMEN AND PELVIS TECHNIQUE: Non-contrast CT of the chest was initially obtained. Multidetector CT imaging through the chest, abdomen and pelvis was performed using the standard protocol during bolus administration of intravenous contrast. Multiplanar reconstructed images and MIPs  were obtained and reviewed to evaluate the vascular anatomy. RADIATION DOSE REDUCTION: This exam was performed according to the departmental dose-optimization program which includes automated exposure control, adjustment of the mA and/or kV according to patient size and/or use of iterative reconstruction technique. CONTRAST:  75mL OMNIPAQUE IOHEXOL 350 MG/ML SOLN COMPARISON:  CT abdomen pelvis dated 02/01/2022. FINDINGS: CTA CHEST  FINDINGS Cardiovascular: There is no cardiomegaly or pericardial effusion. The thoracic aorta is unremarkable. The origins of the great vessels of the aortic arch appear patent. No pulmonary artery embolus identified. Mediastinum/Nodes: No hilar or mediastinal adenopathy. The esophagus is grossly unremarkable. There is a 3 cm right thyroid hypodense nodule. Recommend thyroid US (ref: J Am Coll Radiol. 2015 Feb;12(2): 143-50).No mediastinal fluid collection. Lungs/Pleura: The lungs are clear. There is no pleural effusion or pneumothorax. The central airways are patent. Musculoskeletal: No acute osseous pathology. Review of the MIP images confirms the above findings. CTA ABDOMEN AND PELVIS FINDINGS VASCULAR Aorta: Mild atherosclerotic calcification. No aneurysmal dilatation or dissection. No periaortic fluid collection. Celiac: Patent without evidence of aneurysm, dissection, vasculitis or significant stenosis. SMA: Patent without evidence of aneurysm, dissection, vasculitis or significant stenosis. Renals: Both renal arteries are patent without evidence of aneurysm, dissection, vasculitis, fibromuscular dysplasia or significant stenosis. IMA: Patent without evidence of aneurysm, dissection, vasculitis or significant stenosis. Inflow: Patent without evidence of aneurysm, dissection, vasculitis or significant stenosis. Veins: No obvious venous abnormality within the limitations of this arterial phase study. Review of the MIP images confirms the above findings. NON-VASCULAR No intra-abdominal  free air or free fluid. Hepatobiliary: No focal liver abnormality is seen. No gallstones, gallbladder wall thickening, or biliary dilatation. Pancreas: Unremarkable. No pancreatic ductal dilatation or surrounding inflammatory changes. Spleen: Normal in size without focal abnormality. Adrenals/Urinary Tract: The adrenal glands are unremarkable. There is no hydronephrosis on either side. The visualized ureters and urinary bladder appear unremarkable. Stomach/Bowel: There is no bowel obstruction or active inflammation. The appendix is normal. Lymphatic: No adenopathy. Reproductive: The uterus is anteverted and grossly unremarkable. No adnexal masses. Other: None Musculoskeletal: No acute or significant osseous findings. Review of the MIP images confirms the above findings. IMPRESSION: 1. No acute intrathoracic, abdominal, or pelvic pathology. No aortic aneurysm or dissection. 2. A 3 cm right thyroid hypodense nodule. Recommend thyroid US. Electronically Signed   By: Elgie Collard M.D.   On: 03/05/2023 02:23    Procedures Procedures    Medications Ordered in ED Medications  aspirin chewable tablet 324 mg (has no administration in time range)  labetalol (NORMODYNE) injection 10 mg (has no administration in time range)  heparin bolus via infusion 4,000 Units (has no administration in time range)  heparin ADULT infusion 100 units/mL (25000 units/225mL) (has no administration in time range)  iohexol (OMNIPAQUE) 350 MG/ML injection 75 mL (75 mLs Intravenous Contrast Given 03/05/23 0211)    ED Course/ Medical Decision Making/ A&P                             Medical Decision Making Amount and/or Complexity of Data Reviewed External Data Reviewed: ECG and notes. Labs: ordered. Decision-making details documented in ED Course. Radiology: ordered and independent interpretation performed. Decision-making details documented in ED Course. ECG/medicine tests: ordered and independent interpretation performed.  Decision-making details documented in ED Course.  Risk OTC drugs. Prescription drug management. Decision regarding hospitalization.   Patient presents to the emergency department for evaluation of chest pain.  Patient does have multiple cardiac risk factors including increased BMI, hypertension, diabetes.  In addition, she has a history of carotid artery dissection.  EKG on arrival did not show obvious changes from prior.  CT angiography of chest, abdomen, pelvis performed to ensure that this was not aortic dissection causing her elevated troponin.  No dissection is noted.  Second troponin slightly elevated above the  first.  Patient will be administered aspirin and IV heparin, cardiology consultation for NSTEMI.  Addendum: Patient with recurrent pain, resolved after SL NTG, will apply nitropaste.  CRITICAL CARE Performed by: Gilda Crease   Total critical care time: 35 minutes  Critical care time was exclusive of separately billable procedures and treating other patients.  Critical care was necessary to treat or prevent imminent or life-threatening deterioration.  Critical care was time spent personally by me on the following activities: development of treatment plan with patient and/or surrogate as well as nursing, discussions with consultants, evaluation of patient's response to treatment, examination of patient, obtaining history from patient or surrogate, ordering and performing treatments and interventions, ordering and review of laboratory studies, ordering and review of radiographic studies, pulse oximetry and re-evaluation of patient's condition.         Final Clinical Impression(s) / ED Diagnoses Final diagnoses:  NSTEMI (non-ST elevated myocardial infarction) [facility])    Rx / DC Orders ED Discharge Orders     None         Yoceline Bazar, Canary Brim, MD 03/05/23 0244    Gilda Crease, MD 03/05/23 [phone]

## 2023-03-05 NOTE — Interval H&P Note (Signed)
History and Physical Interval Note:  03/05/2023 4:10 PM  Joann Meyers  has presented today for surgery, with the diagnosis of NSTEMI.  The various methods of treatment have been discussed with the patient and family. After consideration of risks, benefits and other options for treatment, the patient has consented to  Procedure(s): LEFT HEART CATH AND CORONARY ANGIOGRAPHY (N/A) as a surgical intervention.  The patient's history has been reviewed, patient examined, no change in status, stable for surgery.  I have reviewed the patient's chart and labs.  Questions were answered to the patient's satisfaction.    Cath Lab Visit (complete for each Cath Lab visit)  Clinical Evaluation Leading to the Procedure:   ACS: Yes.    Non-ACS:  N/A  Joann Meyers

## 2023-03-05 NOTE — Progress Notes (Signed)
[facility] Post-PCI Note  Date: 03/05/23  Time: 7:23 PM  I was notified by the patient's RN that Ms. Groh was complaining of severe pain just above the TR band on her left wrist.  Slight fullness and bruising was reported without hematoma.  Removal of 1 mL of air and administration of fentanyl 25 mcg was recommended.  After this, I personally evaluated the site, which demonstrates mild bruising and puffiness above the TR band but no obvious hematoma.  No bleeding noted.  Distal perfusion intact.  Patient reports pain has improved considerably.  RN reports fullness has also improved.  Recommend continued deflation of TR band per protocol and close monitoring of the access site.  Yvonne Kendall, MD [facility]

## 2023-03-05 NOTE — ED Notes (Signed)
Trop 209.  Relayed critical lab to provider.
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
## 2023-03-05 NOTE — Progress Notes (Signed)
C/O 10/10 left radial vascular site pain. Area puffy, not hard, very tender to light touch, very faintly bruised. Dr. Okey Dupre notified.

## 2023-03-05 NOTE — ED Notes (Signed)
Patient transported to echo ?
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
## 2023-03-05 NOTE — ED Notes (Signed)
Pt with transport to cath lab.
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
## 2023-03-06 ENCOUNTER — Other Ambulatory Visit (HOSPITAL_COMMUNITY): Payer: Self-pay

## 2023-03-06 ENCOUNTER — Telehealth: Payer: Self-pay | Admitting: Cardiology

## 2023-03-06 ENCOUNTER — Telehealth (HOSPITAL_COMMUNITY): Payer: Self-pay | Admitting: Pharmacy Technician

## 2023-03-06 ENCOUNTER — Encounter: Payer: Self-pay | Admitting: Cardiology

## 2023-03-06 ENCOUNTER — Encounter (HOSPITAL_COMMUNITY): Payer: Self-pay | Admitting: Internal Medicine

## 2023-03-06 DIAGNOSIS — Z7984 Long term (current) use of oral hypoglycemic drugs: Secondary | ICD-10-CM | POA: Diagnosis not present

## 2023-03-06 DIAGNOSIS — E1165 Type 2 diabetes mellitus with hyperglycemia: Secondary | ICD-10-CM | POA: Diagnosis not present

## 2023-03-06 DIAGNOSIS — I2510 Atherosclerotic heart disease of native coronary artery without angina pectoris: Secondary | ICD-10-CM | POA: Diagnosis not present

## 2023-03-06 DIAGNOSIS — I214 Non-ST elevation (NSTEMI) myocardial infarction: Secondary | ICD-10-CM | POA: Diagnosis not present

## 2023-03-06 DIAGNOSIS — Z79899 Other long term (current) drug therapy: Secondary | ICD-10-CM | POA: Diagnosis not present

## 2023-03-06 DIAGNOSIS — I252 Old myocardial infarction: Secondary | ICD-10-CM | POA: Diagnosis not present

## 2023-03-06 DIAGNOSIS — G40909 Epilepsy, unspecified, not intractable, without status epilepticus: Secondary | ICD-10-CM | POA: Diagnosis not present

## 2023-03-06 DIAGNOSIS — Z8673 Personal history of transient ischemic attack (TIA), and cerebral infarction without residual deficits: Secondary | ICD-10-CM | POA: Diagnosis not present

## 2023-03-06 DIAGNOSIS — D649 Anemia, unspecified: Secondary | ICD-10-CM | POA: Diagnosis not present

## 2023-03-06 DIAGNOSIS — I10 Essential (primary) hypertension: Secondary | ICD-10-CM | POA: Insufficient documentation

## 2023-03-06 LAB — CBC
HCT: 24.4 % — ABNORMAL LOW (ref 36.0–46.0)
Hemoglobin: 8.1 g/dL — ABNORMAL LOW (ref 12.0–15.0)
MCH: 26.3 pg (ref 26.0–34.0)
MCHC: 33.2 g/dL (ref 30.0–36.0)
MCV: 79.2 fL — ABNORMAL LOW (ref 80.0–100.0)
Platelets: 178 10*3/uL (ref 150–400)
RBC: 3.08 MIL/uL — ABNORMAL LOW (ref 3.87–5.11)
RDW: 16.2 % — ABNORMAL HIGH (ref 11.5–15.5)
WBC: 4.9 10*3/uL (ref 4.0–10.5)
nRBC: 0 % (ref 0.0–0.2)

## 2023-03-06 LAB — BASIC METABOLIC PANEL
Anion gap: 7 (ref 5–15)
BUN: 14 mg/dL (ref 6–20)
CO2: 23 mmol/L (ref 22–32)
Calcium: 8.5 mg/dL — ABNORMAL LOW (ref 8.9–10.3)
Chloride: 105 mmol/L (ref 98–111)
Creatinine, Ser: 0.99 mg/dL (ref 0.44–1.00)
GFR, Estimated: >60 mL/min (ref >60)
Glucose, Bld: 289 mg/dL — ABNORMAL HIGH (ref 70–99)
Potassium: 3.5 mmol/L (ref 3.5–5.1)
Sodium: 135 mmol/L (ref 135–145)

## 2023-03-06 LAB — LIPID PANEL
Cholesterol: 168 mg/dL (ref 0–200)
HDL: 24 mg/dL — ABNORMAL LOW (ref >40)
LDL Cholesterol: 66 mg/dL (ref 0–99)
Total CHOL/HDL Ratio: 7 RATIO
Triglycerides: 388 mg/dL — ABNORMAL HIGH (ref <150)
VLDL: 78 mg/dL — ABNORMAL HIGH (ref 0–40)

## 2023-03-06 LAB — HEMOGLOBIN A1C
Hgb A1c MFr Bld: 8.7 % — ABNORMAL HIGH (ref 4.8–5.6)
Mean Plasma Glucose: 202.99 mg/dL

## 2023-03-06 LAB — GLUCOSE, CAPILLARY
Glucose-Capillary: 214 mg/dL — ABNORMAL HIGH (ref 70–99)
Glucose-Capillary: 297 mg/dL — ABNORMAL HIGH (ref 70–99)
Glucose-Capillary: 269 mg/dL — ABNORMAL HIGH (ref 70–99)

## 2023-03-06 MED ORDER — NITROGLYCERIN 0.4 MG SL SUBL
0.4000 mg | SUBLINGUAL_TABLET | SUBLINGUAL | 2 refills | Status: DC | PRN
  Filled 2023-03-06: qty 25, 5d supply, fill #0

## 2023-03-06 MED ORDER — METFORMIN HCL 1000 MG PO TABS
1000.0000 mg | ORAL_TABLET | Freq: Two times a day (BID) | ORAL | 2 refills | Status: DC
  Filled 2023-03-06: qty 60, 30d supply, fill #0

## 2023-03-06 MED ORDER — CLOPIDOGREL BISULFATE 75 MG PO TABS
75.0000 mg | ORAL_TABLET | Freq: Every day | ORAL | 11 refills | Status: DC
  Filled 2023-03-06: qty 30, 30d supply, fill #0

## 2023-03-06 MED ORDER — ATORVASTATIN CALCIUM 80 MG PO TABS
80.0000 mg | ORAL_TABLET | Freq: Every day | ORAL | 11 refills | Status: DC
  Filled 2023-03-06: qty 30, 30d supply, fill #0

## 2023-03-06 MED ORDER — CLOPIDOGREL BISULFATE 75 MG PO TABS
300.0000 mg | ORAL_TABLET | Freq: Once | ORAL | Status: AC
  Administered 2023-03-06: 300 mg via ORAL
  Filled 2023-03-06: qty 4

## 2023-03-06 MED ORDER — METOPROLOL SUCCINATE ER 25 MG PO TB24
25.0000 mg | ORAL_TABLET | Freq: Every day | ORAL | 6 refills | Status: DC
  Filled 2023-03-06: qty 30, 30d supply, fill #0

## 2023-03-06 MED ORDER — GLIPIZIDE 5 MG PO TABS
5.0000 mg | ORAL_TABLET | Freq: Two times a day (BID) | ORAL | 2 refills | Status: DC
  Filled 2023-03-06: qty 60, 30d supply, fill #0

## 2023-03-06 MED ORDER — ASPIRIN 81 MG PO TBEC
81.0000 mg | DELAYED_RELEASE_TABLET | Freq: Every day | ORAL | 12 refills | Status: DC
  Filled 2023-03-06: qty 120, 120d supply, fill #0

## 2023-03-06 NOTE — Inpatient Diabetes Management (Signed)
Inpatient Diabetes Program Recommendations  AACE/ADA: New Consensus Statement on Inpatient Glycemic Control (2015)  Target Ranges:  Prepandial:   less than 140 mg/dL      Peak postprandial:   less than 180 mg/dL (1-2 hours)      Critically ill patients:  140 - 180 mg/dL   Lab Results  Component Value Date   GLUCAP 297 (H) 03/06/2023   HGBA1C 9.5 (A) 02/19/2023    Review of Glycemic Control  Diabetes history: DM 2 Outpatient Diabetes medications: Metformin 1000 mg bid, Glipizide 5 mg bid (unable to get for 3-4 weeks) Current orders for Inpatient glycemic control:  Novolog 0-9 units tid  A1c 9.5% on 5/6  Spoke with pt at bedside regarding A1c and glucose control at home. Pt reports running out of Glipizide 3-4 weeks ago due to insurance. PT also reports not having a working meter at home to monitor her glucose trends. Pt reports being compliant with her diet and states she drinks water at home. Pt reports not having any other needs at this time other than a glucose meter and being able to get her Glipizide at time of discharge. I see where the internal medicine clinic has set her up with [facility] for medication review and Diabetes teaching.  Will follow while here.   -  Pt needs blood glucose meter kit at time of d/c order #[id number]  Thanks,  Christena Deem RN, MSN, BC-ADM Inpatient Diabetes Coordinator Team Pager [phone] ([phone]-5p)

## 2023-03-06 NOTE — Discharge Summary (Signed)
Discharge Summary    Patient ID: Joann Meyers MRN: [id number]; DOB: [birth year]  Admit date: 03/04/2023 Discharge date: 03/06/2023  PCP:  Georganna Skeans, MD   [facility] Providers Cardiologist:  Thomasene Ripple, DO   {  Discharge Diagnoses    Principal Problem:   NSTEMI (non-ST elevated myocardial infarction) [facility]) Active Problems:   History of TIA (transient ischemic attack) and stroke   Seizures (HCC)   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HCC)   Type 2 diabetes mellitus without complication, without long-term current use of insulin (HCC)   Anemia   HTN (hypertension)    Diagnostic Studies/Procedures    Left heart catheterization 03/05/2023 Multivessel coronary artery disease, as detailed below.  Potential culprit lesions for the patient's NSTEMI include sequential 90% and 95% D1 lesions and tandem 80% and 60% LCx stenoses. Mildly elevated left ventricular filling pressure (LVEDP 20 mmHg). Successful RFR-guided PCI to LCx using Synergy 2.5 x 32 mm drug-eluting stent with 0% residual stenosis and TIMI-3 flow. Successful PCI to proximal D1 lesion using Synergy 2.25 x 12 mm drug-eluting stent and PTCA to lateral branch of D1 leading to 0% residual stenosis in the proximal segment and reduction of stenosis from 95% to 50% in the lateral branch and reestablishment of TIMI-3 flow.   Recommendations: Dual antiplatelet therapy with aspirin and ticagrelor for at least 12 months. Aggressive secondary prevention of coronary artery disease.   TTE 03/06/2023   1. Left ventricular ejection fraction, by estimation, is 55 to 60%. The left ventricle has normal function. The left ventricle has no regional wall motion abnormalities. Left ventricular diastolic parameters are  indeterminate.   2. Right ventricular systolic function is normal. The right ventricular size is normal.   3. The mitral valve is normal in structure. Trivial mitral valve regurgitation. No evidence of mitral stenosis.    4. The aortic valve is tricuspid. Aortic valve regurgitation is mild. No aortic stenosis is present.   5. The inferior vena cava is normal in size with greater than 50% respiratory variability, suggesting right atrial pressure of 3 mmHg.   Comparison(s): No prior Echocardiogram.   Conclusion(s)/Recommendation(s): Otherwise normal echocardiogram, with minor abnormalities described in the report.  _____________   History of Present Illness     Joann Meyers is a 37 y.o. female with history of diabetes mellitus, hypertension, seizures, anemia, carotid dissection, preeclampsia.  Patient presented to the emergency room with complaints of chest pain that had been going on intermittently for the past 2 months.  She has had a few emergency department visits but with negative workups.  Now she is presenting with worsening atypical nonexertional chest pain and had elevated troponins in the 200s.  EKG did not show any ischemic changes.  She was started on IV heparin underwent cardiac catheterization.  Hospital Course     Consultants:    NSTEMI (multivessel coronary artery disease) (sequential 90% and 95% D1 lesions and tandem 80% and 60% LCx stenoses) Status post PCI with stent to the D1 and left circumflex artery.  Echocardiogram during this admission indicated LVEF of 55 to 60% without any regional wall motion abnormalities. Initially had plans for Brillinta however, it is not on the formulary and she has hx of CVA so will transition to Plavix. She will get loading dose of 300mg  that will be given while still inpatient given anemia and timing of last Brillinta dose and then be continued on 75mg  for the next 12 months. Continue aspirin 81 mg and Plavix 75mg  daily uninterrupted  for 12 months Continue atorvastatin 80 mg, and will consolidate lopressor to Toprol 25mg  daily. A1c and lipid panel ordered today. Results can be followed outpatient.   Hypertension PTA lisinopril 10 mg daily, resume.    Uncontrolled diabetes mellitus A1c 9.5%.  Need to aggressively manage outpatient. PTA metformin 1000 mg twice daily. Need to hold off for 48 hours after catheterization.  Can resume on 03/08/2023.  Chronic anemia  Hemoglobin 8.1, RBC 3.08.  Appears to be around baseline. Monitor for signs of bleeding and recheck at follow up. No evidence of acute bleeding during this admission. Defer to PCP if need for further work up.   Hx of CVA, carotid dissection.  Patient has been seen and evaluated by myself and Dr. Servando Salina.  She has been deemed stable for discharge.  Catheterization site has been assessed and without acute complications. She may leave after she gets her loading dose of Plavix. Patient and nurse has been informed of plans.  Work note has been printed off and placed in patients chart.    Did the patient have an acute coronary syndrome (MI, NSTEMI, STEMI, etc) this admission?:  Yes                               AHA/ACC Clinical Performance & Quality Measures: Aspirin prescribed? - Yes ADP Receptor Inhibitor (Plavix/Clopidogrel, Brilinta/Ticagrelor or Effient/Prasugrel) prescribed (includes medically managed patients)? - Yes Beta Blocker prescribed? - Yes High Intensity Statin (Lipitor 40-80mg  or Crestor 20-40mg ) prescribed? - Yes EF assessed during THIS hospitalization? - Yes For EF <40%, was ACEI/ARB prescribed? - Not Applicable (EF >/= 40%) For EF <40%, Aldosterone Antagonist (Spironolactone or Eplerenone) prescribed? - Not Applicable (EF >/= 40%) Cardiac Rehab Phase II ordered (including medically managed patients)? - Yes       The patient will be scheduled for a TOC follow up appointment in 14 days.  A [facility] at the office where the patient should be seen for follow up.  _____________  Discharge Vitals Blood pressure (!) 166/89, pulse 89, temperature 99 F (37.2 C), temperature source Oral, resp. rate 19, height 5\' 2"  (1.575 m),  weight 89.8 kg, SpO2 99 %, not currently breastfeeding.  Filed Weights   03/04/23 2337  Weight: 89.8 kg    Labs & Radiologic Studies    CBC Recent Labs    03/04/23 2350 03/06/23 0237  WBC 4.6 4.9  HGB 9.4* 8.1*  HCT 28.5* 24.4*  MCV 79.6* 79.2*  PLT 182 178   Basic Metabolic Panel Recent Labs    11/91/47 2350 03/06/23 0237  NA 133* 135  K 4.1 3.5  CL 102 105  CO2 21* 23  GLUCOSE 356* 289*  BUN 15 14  CREATININE 0.73 0.99  CALCIUM 8.7* 8.5*   Liver Function Tests No results for input(s): "AST", "ALT", "ALKPHOS", "BILITOT", "PROT", "ALBUMIN" in the last 72 hours. No results for input(s): "LIPASE", "AMYLASE" in the last 72 hours. High Sensitivity Troponin:   Recent Labs  Lab 03/04/23 2350 03/05/23 0121  TROPONINIHS 209* 233*    BNP Invalid input(s): "POCBNP" D-Dimer No results for input(s): "DDIMER" in the last 72 hours. Hemoglobin A1C No results for input(s): "HGBA1C" in the last 72 hours. Fasting Lipid Panel No results for input(s): "CHOL", "HDL", "LDLCALC", "TRIG", "CHOLHDL", "LDLDIRECT" in the last 72 hours. Thyroid Function Tests No results for input(s): "TSH", "T4TOTAL", "T3FREE", "THYROIDAB" in the  last 72 hours.  Invalid input(s): "FREET3" _____________  CARDIAC CATHETERIZATION  Result Date: 03/05/2023 Conclusions: Multivessel coronary artery disease, as detailed below.  Potential culprit lesions for the patient's NSTEMI include sequential 90% and 95% D1 lesions and tandem 80% and 60% LCx stenoses. Mildly elevated left ventricular filling pressure (LVEDP 20 mmHg). Successful RFR-guided PCI to LCx using Synergy 2.5 x 32 mm drug-eluting stent with 0% residual stenosis and TIMI-3 flow. Successful PCI to proximal D1 lesion using Synergy 2.25 x 12 mm drug-eluting stent and PTCA to lateral branch of D1 leading to 0% residual stenosis in the proximal segment and reduction of stenosis from 95% to 50% in the lateral branch and reestablishment of TIMI-3 flow.  Recommendations: Dual antiplatelet therapy with aspirin and ticagrelor for at least 12 months. Aggressive secondary prevention of coronary artery disease. Yvonne Kendall, MD [facility]  ECHOCARDIOGRAM COMPLETE  Result Date: 03/05/2023    ECHOCARDIOGRAM REPORT   Patient Name:   Joann Meyers Date of Exam: 03/05/2023 Medical Rec #:  [id number]       Height:       62.0 in Accession #:    [id number]      Weight:       198.0 lb Date of Birth:  [birth year]       BSA:          1.904 m Patient Age:    37 years        BP:           135/76 mmHg Patient Gender: F               HR:           79 bpm. Exam Location:  Inpatient Procedure: 2D Echo, Cardiac Doppler and Color Doppler Indications:    Abnormal ECG R94.31  History:        Patient has prior history of Echocardiogram examinations, most                 recent 02/02/2022. TIA and Carotid Disease; Risk                 Factors:Non-Smoker.  Sonographer:    Dondra Prader RVT RCS Referring Phys: Hermelinda Dellen IMPRESSIONS  1. Left ventricular ejection fraction, by estimation, is 55 to 60%. The left ventricle has normal function. The left ventricle has no regional wall motion abnormalities. Left ventricular diastolic parameters are indeterminate.  2. Right ventricular systolic function is normal. The right ventricular size is normal.  3. The mitral valve is normal in structure. Trivial mitral valve regurgitation. No evidence of mitral stenosis.  4. The aortic valve is tricuspid. Aortic valve regurgitation is mild. No aortic stenosis is present.  5. The inferior vena cava is normal in size with greater than 50% respiratory variability, suggesting right atrial pressure of 3 mmHg. Comparison(s): No prior Echocardiogram. Conclusion(s)/Recommendation(s): Otherwise normal echocardiogram, with minor abnormalities described in the report. FINDINGS  Left Ventricle: Left ventricular ejection fraction, by estimation, is 55 to 60%. The left ventricle has normal function. The left  ventricle has no regional wall motion abnormalities. The left ventricular internal cavity size was normal in size. There is  no left ventricular hypertrophy. Left ventricular diastolic parameters are indeterminate. Right Ventricle: The right ventricular size is normal. No increase in right ventricular wall thickness. Right ventricular systolic function is normal. Left Atrium: Left atrial size was normal in size. Right Atrium: Right atrial size was normal in size. Pericardium: There is no evidence of  pericardial effusion. Mitral Valve: The mitral valve is normal in structure. Trivial mitral valve regurgitation. No evidence of mitral valve stenosis. Tricuspid Valve: The tricuspid valve is normal in structure. Tricuspid valve regurgitation is trivial. No evidence of tricuspid stenosis. Aortic Valve: The aortic valve is tricuspid. Aortic valve regurgitation is mild. Aortic regurgitation PHT measures 890 msec. No aortic stenosis is present. Aortic valve mean gradient measures 5.0 mmHg. Aortic valve peak gradient measures 10.4 mmHg. Aortic valve area, by VTI measures 2.92 cm. Pulmonic Valve: The pulmonic valve was not well visualized. Pulmonic valve regurgitation is not visualized. No evidence of pulmonic stenosis. Aorta: The aortic root, ascending aorta, aortic arch and descending aorta are all structurally normal, with no evidence of dilitation or obstruction. Venous: The inferior vena cava is normal in size with greater than 50% respiratory variability, suggesting right atrial pressure of 3 mmHg. IAS/Shunts: The atrial septum is grossly normal.  LEFT VENTRICLE PLAX 2D LVIDd:         5.00 cm   Diastology LVIDs:         3.70 cm   LV e' medial:    2.40 cm/s LV PW:         1.10 cm   LV E/e' medial:  34.1 LV IVS:        1.00 cm   LV e' lateral:   6.90 cm/s LVOT diam:     2.10 cm   LV E/e' lateral: 11.9 LV SV:         91 LV SV Index:   48 LVOT Area:     3.46 cm  RIGHT VENTRICLE             IVC RV Basal diam:  2.70 cm      IVC diam: 1.60 cm RV S prime:     16.80 cm/s TAPSE (M-mode): 2.4 cm LEFT ATRIUM             Index        RIGHT ATRIUM           Index LA diam:        4.10 cm 2.15 cm/m   RA Area:     10.90 cm LA Vol (A2C):   50.9 ml 26.74 ml/m  RA Volume:   23.80 ml  12.50 ml/m LA Vol (A4C):   44.7 ml 23.48 ml/m LA Biplane Vol: 49.2 ml 25.85 ml/m  AORTIC VALVE                     PULMONIC VALVE AV Area (Vmax):    2.88 cm      PV Vmax:       1.25 m/s AV Area (Vmean):   2.94 cm      PV Peak grad:  6.2 mmHg AV Area (VTI):     2.92 cm AV Vmax:           161.00 cm/s AV Vmean:          106.000 cm/s AV VTI:            0.312 m AV Peak Grad:      10.4 mmHg AV Mean Grad:      5.0 mmHg LVOT Vmax:         134.00 cm/s LVOT Vmean:        90.100 cm/s LVOT VTI:          0.263 m LVOT/AV VTI ratio: 0.84 AI PHT:  890 msec AR Vena Contracta: 0.60 cm  AORTA Ao Root diam: 2.90 cm Ao Asc diam:  3.20 cm MITRAL VALVE MV Area (PHT): 4.68 cm    SHUNTS MV Decel Time: 162 msec    Systemic VTI:  0.26 m MV E velocity: 81.80 cm/s  Systemic Diam: 2.10 cm MV A velocity: 95.90 cm/s MV E/A ratio:  0.85 Jodelle Red MD Electronically signed by Jodelle Red MD Signature Date/Time: 03/05/2023/10:33:53 AM    Final    CT ANGIO CHEST/ABD/PEL FOR DISSECTION W &/OR WO CONTRAST  Result Date: 03/05/2023 CLINICAL DATA:  Acute aortic syndrome suspected. EXAM: CT ANGIOGRAPHY CHEST, ABDOMEN AND PELVIS TECHNIQUE: Non-contrast CT of the chest was initially obtained. Multidetector CT imaging through the chest, abdomen and pelvis was performed using the standard protocol during bolus administration of intravenous contrast. Multiplanar reconstructed images and MIPs were obtained and reviewed to evaluate the vascular anatomy. RADIATION DOSE REDUCTION: This exam was performed according to the departmental dose-optimization program which includes automated exposure control, adjustment of the mA and/or kV according to patient size and/or use of  iterative reconstruction technique. CONTRAST:  75mL OMNIPAQUE IOHEXOL 350 MG/ML SOLN COMPARISON:  CT abdomen pelvis dated 02/01/2022. FINDINGS: CTA CHEST FINDINGS Cardiovascular: There is no cardiomegaly or pericardial effusion. The thoracic aorta is unremarkable. The origins of the great vessels of the aortic arch appear patent. No pulmonary artery embolus identified. Mediastinum/Nodes: No hilar or mediastinal adenopathy. The esophagus is grossly unremarkable. There is a 3 cm right thyroid hypodense nodule. Recommend thyroid US (ref: J Am Coll Radiol. 2015 Feb;12(2): 143-50).No mediastinal fluid collection. Lungs/Pleura: The lungs are clear. There is no pleural effusion or pneumothorax. The central airways are patent. Musculoskeletal: No acute osseous pathology. Review of the MIP images confirms the above findings. CTA ABDOMEN AND PELVIS FINDINGS VASCULAR Aorta: Mild atherosclerotic calcification. No aneurysmal dilatation or dissection. No periaortic fluid collection. Celiac: Patent without evidence of aneurysm, dissection, vasculitis or significant stenosis. SMA: Patent without evidence of aneurysm, dissection, vasculitis or significant stenosis. Renals: Both renal arteries are patent without evidence of aneurysm, dissection, vasculitis, fibromuscular dysplasia or significant stenosis. IMA: Patent without evidence of aneurysm, dissection, vasculitis or significant stenosis. Inflow: Patent without evidence of aneurysm, dissection, vasculitis or significant stenosis. Veins: No obvious venous abnormality within the limitations of this arterial phase study. Review of the MIP images confirms the above findings. NON-VASCULAR No intra-abdominal free air or free fluid. Hepatobiliary: No focal liver abnormality is seen. No gallstones, gallbladder wall thickening, or biliary dilatation. Pancreas: Unremarkable. No pancreatic ductal dilatation or surrounding inflammatory changes. Spleen: Normal in size without focal  abnormality. Adrenals/Urinary Tract: The adrenal glands are unremarkable. There is no hydronephrosis on either side. The visualized ureters and urinary bladder appear unremarkable. Stomach/Bowel: There is no bowel obstruction or active inflammation. The appendix is normal. Lymphatic: No adenopathy. Reproductive: The uterus is anteverted and grossly unremarkable. No adnexal masses. Other: None Musculoskeletal: No acute or significant osseous findings. Review of the MIP images confirms the above findings. IMPRESSION: 1. No acute intrathoracic, abdominal, or pelvic pathology. No aortic aneurysm or dissection. 2. A 3 cm right thyroid hypodense nodule. Recommend thyroid US. Electronically Signed   By: Elgie Collard M.D.   On: 03/05/2023 02:23   Disposition   Pt is being discharged home today in good condition.  Follow-up Plans & Appointments     Follow-up Information     Tobb, Kardie, DO Follow up.   Specialty: Cardiology Why: Your appointment is on 05/03/2023 at 8:40 AM.  Please arrive at least 15 minutes before appointment time. Contact information: [address] [place] [address] [phone]         Ronney Asters, NP Follow up.   Specialty: Cardiology Why: Your appointment is on 03/14/2023 at 2:20 PM. Please arrive at least 15 minutes before appointment time Contact information: [address] [place] [address] [phone]                Discharge Instructions     Amb Referral to Cardiac Rehabilitation   Complete by: As directed    Diagnosis:  NSTEMI Coronary Stents     After initial evaluation and assessments completed: Virtual Based Care may be provided alone or in conjunction with Phase 2 Cardiac Rehab based on patient barriers.: Yes   Intensive Cardiac Rehabilitation (ICR) [facility] location only OR Traditional Cardiac Rehabilitation (TCR) *If criteria for ICR are not met will enroll in TCR [facility] only): Yes   Diet - low sodium heart  healthy   Complete by: As directed    Discharge instructions   Complete by: As directed    No lifting over 5 lbs for 1 week. No sexual activity for 1 week. You may return to work on 03/07/2023. Keep procedure site clean & dry. If you notice increased pain, swelling, bleeding or pus, call/return!  You may shower, but no soaking baths/hot tubs/pools for 1 week.   You may resume your metformin on 03/08/23.   I also have discontinued your ibuprofen as this can increase your risk for bleeding.   Increase activity slowly   Complete by: As directed         Discharge Medications   Allergies as of 03/06/2023       Reactions   Fish-derived Products Shortness Of Breath   Morphine And Codeine Anaphylaxis   Shellfish Allergy Anaphylaxis   "Swell up and can't breath"   [place] [facility] Allergy] Anaphylaxis   "couldn't breath"   Feraheme [ferumoxytol] Other (See Comments)   Hx of reaction- myalgia legs and lower back during infusion. No reaction to oral iron.   Latex Dermatitis   Pork-derived Products    No reaction, religious preference        Medication List     STOP taking these medications    ibuprofen 200 MG tablet Commonly known as: ADVIL   methocarbamol 500 MG tablet Commonly known as: ROBAXIN       TAKE these medications    acetaminophen 500 MG tablet Commonly known as: TYLENOL Take 1,000 mg by mouth every 6 (six) hours as needed for moderate pain.   aspirin EC 81 MG tablet Take 1 tablet (81 mg total) by mouth daily. Swallow whole. Start taking on: Mar 07, 2023   atorvastatin 80 MG tablet Commonly known as: LIPITOR Take 1 tablet (80 mg total) by mouth daily. Start taking on: Mar 07, 2023   clopidogrel 75 MG tablet Commonly known as: Plavix Take 1 tablet (75 mg total) by mouth daily.   glipiZIDE 5 MG tablet Commonly known as: GLUCOTROL Take 1 tablet (5 mg total) by mouth 2 (two) times daily before a meal.   lisinopril 10 MG tablet Commonly known as:  ZESTRIL Take 1 tablet (10 mg total) by mouth daily.   metFORMIN 1000 MG tablet Commonly known as: GLUCOPHAGE Take 1 tablet (1,000 mg total) by mouth 2 (two) times daily at 8 am and 10 pm.   metoprolol succinate 25 MG 24 hr tablet Commonly known  as: TOPROL-XL Take 1 tablet (25 mg total) by mouth daily.   nitroGLYCERIN 0.4 MG SL tablet Commonly known as: NITROSTAT Place 1 tablet (0.4 mg total) under the tongue every 5 (five) minutes as needed for chest pain.           Outstanding Labs/Studies   Need to check CBC outpatient  Duration of Discharge Encounter   Greater than 30 minutes including physician time.  Signed, Abagail Kitchens, PA-C 03/06/2023, 11:53 AM

## 2023-03-06 NOTE — Progress Notes (Signed)
CARDIAC REHAB PHASE I   PRE:  Rate/Rhythm: 77 SR  BP:  Sitting: 166/89      SaO2: 97 RA  MODE:  Ambulation: 125 ft   POST:  Rate/Rhythm: 85 SR  BP:  Sitting: 151/81      SaO2: 97 RA  Pt ambulated in hall, tolerated well with no SOB,CP or dizziness. Returned to bed with call bell and bedside table in reach. Post MI/stent education including site care, restrictions, risk factors, exercise guidelines, NTG use, heart healthy diabetic diet, antiplatelet therapy importance and CRP2 reviewed. All questions and concerns addressed. Will refer to [facility] for CRP2. Plan for home later today.   [phone]  Woodroe Chen, RN BSN 03/06/2023 11:00 AM

## 2023-03-06 NOTE — Telephone Encounter (Signed)
Pharmacy Patient Advocate Encounter  Insurance verification completed.    The patient is insured through [employer] and [facility]   The patient is currently admitted and ran test claims for the following: Brilinta.  Copays and coinsurance results were relayed to Inpatient clinical team.

## 2023-03-06 NOTE — Telephone Encounter (Signed)
   Transition of Care Follow-up Phone Call Request    Patient Name: Joann Meyers Date of Birth: Oct 28, 1985 Date of Encounter: 03/06/2023  Primary Care Provider:  Georganna Skeans, MD Primary Cardiologist:  Thomasene Ripple, DO  [name] has been scheduled for a transition of care follow up appointment with a [facility] provider:  Edd Fabian 03/14/2023 Dr. Servando Salina 05/03/2023   Please reach out to [name] within 48 hours to confirm appointment and review transition of care protocol questionnaire.  Abagail Kitchens, PA-C  03/06/2023, 9:20 AM

## 2023-03-06 NOTE — Telephone Encounter (Signed)
Tried to reach patient-unable to leave voicemail

## 2023-03-06 NOTE — Progress Notes (Signed)
EOS: On call contacted overnight re: pain to L wrist. Band removed overnight, no changes. Slightly puffy & bruised, consistent with start of shift observation.

## 2023-03-06 NOTE — Plan of Care (Signed)
  Problem: Health Behavior/Discharge Planning: Goal: Ability to safely manage health-related needs after discharge will improve Outcome: Progressing   Problem: Health Behavior/Discharge Planning: Goal: Ability to manage health-related needs will improve Outcome: Progressing   Problem: Metabolic: Goal: Ability to maintain appropriate glucose levels will improve Outcome: Progressing   Problem: Education: Goal: Knowledge of General Education information will improve Description: Including pain rating scale, medication(s)/side effects and non-pharmacologic comfort measures Outcome: Progressing
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
## 2023-03-06 NOTE — TOC Benefit Eligibility Note (Signed)
Patient Product/process development scientist completed.    The patient is currently admitted and upon discharge could be taking Brilinta 90 mg.  Product Not on Formulary must try Plavix or Effient first  The patient is insured through [employer] [place] [employer]   This test claim was processed through [facility]- copay amounts may vary at other pharmacies due to pharmacy/plan contracts, or as the patient moves through the different stages of their insurance plan.  Roland Earl, CPHT Pharmacy Patient Advocate Specialist [facility] Patient Advocate Team Direct Number: [phone]  Fax: [phone]

## 2023-03-06 NOTE — TOC CM/SW Note (Addendum)
Transition of Care [facility]) - Inpatient Brief Assessment   Patient Details  Name: Joann Meyers MRN: [id number] Date of Birth: 1986/09/22  [facility]) CM/SW Contact:    Gala Lewandowsky, RN Phone Number: 03/06/2023, 12:21 PM   Clinical Narrative: Patient presented for chest pain-benefits check completed for Brilinta. Check revealed that Brilinta is not on the formulary; must try Plavix or Effient first. APP is aware. No further needs identified.     Transition of Care Asessment: Insurance and Status: Insurance coverage has been reviewed Patient has primary care physician: Yes Home environment has been reviewed: yes Prior level of function:: reviewed Prior/Current Home Services: No current home services Social Determinants of Health Reivew:  (declined needing food resources.) Readmission risk has been reviewed: Yes Transition of care needs: no transition of care needs at this time

## 2023-03-07 LAB — LIPOPROTEIN A (LPA): Lipoprotein (a): 71.5 nmol/L — ABNORMAL HIGH (ref <75.0)

## 2023-03-07 NOTE — Telephone Encounter (Signed)
Unable to leave message

## 2023-03-08 NOTE — Telephone Encounter (Signed)
Third attempt to call patient.  Unable to leave message.

## 2023-03-13 ENCOUNTER — Telehealth: Payer: Self-pay

## 2023-03-13 NOTE — Transitions of Care (Post Inpatient/ED Visit) (Signed)
   03/13/2023  Name: Joann Meyers MRN: [id number] DOB: 1986-07-09  Today's TOC FU Call Status: Today's TOC FU Call Status:: Unsuccessul Call (1st Attempt) Unsuccessful Call (1st Attempt) Date: 03/13/23  Attempted to reach the patient regarding the most recent Inpatient/ED visit.  Follow Up Plan: Additional outreach attempts will be made to reach the patient to complete the Transitions of Care (Post Inpatient/ED visit) call.   Jodelle Gross, RN, BSN, CCM Care Management Coordinator [facility]
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
## 2023-03-14 ENCOUNTER — Ambulatory Visit: Payer: Medicaid Other | Attending: General Practice | Admitting: General Practice

## 2023-03-14 ENCOUNTER — Encounter: Payer: Self-pay | Admitting: General Practice

## 2023-03-14 VITALS — BP 120/54 | HR 84 | Ht 62.0 in | Wt 202.6 lb

## 2023-03-14 DIAGNOSIS — E782 Mixed hyperlipidemia: Secondary | ICD-10-CM | POA: Diagnosis not present

## 2023-03-14 DIAGNOSIS — I214 Non-ST elevation (NSTEMI) myocardial infarction: Secondary | ICD-10-CM

## 2023-03-14 DIAGNOSIS — Z8673 Personal history of transient ischemic attack (TIA), and cerebral infarction without residual deficits: Secondary | ICD-10-CM | POA: Diagnosis not present

## 2023-03-14 DIAGNOSIS — I10 Essential (primary) hypertension: Secondary | ICD-10-CM | POA: Diagnosis not present

## 2023-03-14 NOTE — Patient Instructions (Signed)
Medication Instructions:  The current medical regimen is effective;  continue present plan and medications as directed. Please refer to the Current Medication list given to you today.  *If you need a refill on your cardiac medications before your next appointment, please call your pharmacy*  Lab Work: FASTING LIPID AND LFT IN 6 WEEKS If you have labs (blood work) drawn today and your tests are completely normal, you will receive your results only by:  MyChart Message (if you have MyChart) OR  A paper copy in the mail If you have any lab test that is abnormal or we need to change your treatment, we will call you to review the results.  Testing/Procedures: NONE  Follow-Up: At [facility], you and your health needs are our priority.  As part of our continuing mission to provide you with exceptional heart care, we have created designated Provider Care Teams.  These Care Teams include your primary Cardiologist (physician) and Advanced Practice Providers (APPs -  Physician Assistants and Nurse Practitioners) who all work together to provide you with the care you need, when you need it.  Your next appointment:   3-4 month(s)  Provider:   Thomasene Ripple, DO     Other Instructions INCREASE PHYSICAL ACTIVITY AS TOLERATED  High-Fiber Eating Plan Fiber, also called dietary fiber, is a type of carbohydrate. It is found foods such as fruits, vegetables, whole grains, and beans. A high-fiber diet can have many health benefits. Your health care provider may recommend a high-fiber diet to help: Prevent constipation. Fiber can make your bowel movements more regular. Lower your cholesterol. Relieve the following conditions: Inflammation of veins in the anus (hemorrhoids). Inflammation of specific areas of the digestive tract (uncomplicated diverticulosis). A problem of the large intestine, also called the colon, that sometimes causes pain and diarrhea (irritable bowel syndrome, or IBS). Prevent  overeating as part of a weight-loss plan. Prevent heart disease, type 2 diabetes, and certain cancers. What are tips for following this plan? Reading food labels  Check the nutrition facts label on food products for the amount of dietary fiber. Choose foods that have 5 grams of fiber or more per serving. The goals for recommended daily fiber intake include: Men (age 67 or younger): 34-38 g. Men (over age 80): 28-34 g. Women (age 58 or younger): 25-28 g. Women (over age 26): 22-25 g. Your daily fiber goal is _____________ g. Shopping Choose whole fruits and vegetables instead of processed forms, such as apple juice or applesauce. Choose a wide variety of high-fiber foods such as avocados, lentils, oats, and kidney beans. Read the nutrition facts label of the foods you choose. Be aware of foods with added fiber. These foods often have high sugar and sodium amounts per serving. Cooking Use whole-grain flour for baking and cooking. Cook with brown rice instead of white rice. Meal planning Start the day with a breakfast that is high in fiber, such as a cereal that contains 5 g of fiber or more per serving. Eat breads and cereals that are made with whole-grain flour instead of refined flour or white flour. Eat brown rice, bulgur wheat, or millet instead of white rice. Use beans in place of meat in soups, salads, and pasta dishes. Be sure that half of the grains you eat each day are whole grains. General information You can get the recommended daily intake of dietary fiber by: Eating a variety of fruits, vegetables, grains, nuts, and beans. Taking a fiber supplement if you are not  able to take in enough fiber in your diet. It is better to get fiber through food than from a supplement. Gradually increase how much fiber you consume. If you increase your intake of dietary fiber too quickly, you may have bloating, cramping, or gas. Drink plenty of water to help you digest fiber. Choose high-fiber  snacks, such as berries, raw vegetables, nuts, and popcorn. What foods should I eat? Fruits Berries. Pears. Apples. Oranges. Avocado. Prunes and raisins. Dried figs. Vegetables Sweet potatoes. Spinach. Kale. Artichokes. Cabbage. Broccoli. Cauliflower. Green peas. Carrots. Squash. Grains Whole-grain breads. Multigrain cereal. Oats and oatmeal. Brown rice. Barley. Bulgur wheat. Millet. Quinoa. Bran muffins. Popcorn. Rye wafer crackers. Meats and other proteins Navy beans, kidney beans, and pinto beans. Soybeans. Split peas. Lentils. Nuts and seeds. Dairy Fiber-fortified yogurt. Beverages Fiber-fortified soy milk. Fiber-fortified orange juice. Other foods Fiber bars. The items listed above may not be a complete list of recommended foods and beverages. Contact a dietitian for more information. What foods should I avoid? Fruits Fruit juice. Cooked, strained fruit. Vegetables Fried potatoes. Canned vegetables. Well-cooked vegetables. Grains White bread. Pasta made with refined flour. White rice. Meats and other proteins Fatty cuts of meat. Fried chicken or fried fish. Dairy Milk. Yogurt. Cream cheese. Sour cream. Fats and oils Butters. Beverages Soft drinks. Other foods Cakes and pastries. The items listed above may not be a complete list of foods and beverages to avoid. Talk with your dietitian about what choices are best for you. Summary Fiber is a type of carbohydrate. It is found in foods such as fruits, vegetables, whole grains, and beans. A high-fiber diet has many benefits. It can help to prevent constipation, lower blood cholesterol, aid weight loss, and reduce your risk of heart disease, diabetes, and certain cancers. Increase your intake of fiber gradually. Increasing fiber too quickly may cause cramping, bloating, and gas. Drink plenty of water while you increase the amount of fiber you consume. The best sources of fiber include whole fruits and vegetables, whole grains,  nuts, seeds, and beans. This information is not intended to replace advice given to you by your health care provider. Make sure you discuss any questions you have with your health care provider. Document Revised: 02/05/2020 Document Reviewed: 02/05/2020 Elsevier Patient Education  2024 [employer].

## 2023-03-16 ENCOUNTER — Telehealth (HOSPITAL_COMMUNITY): Payer: Self-pay

## 2023-03-16 NOTE — Telephone Encounter (Signed)
Pt stated she is interested, will fax Medicaid form to MD. Placed pt ppw in Medicaid folder.

## 2023-03-19 ENCOUNTER — Ambulatory Visit: Payer: Medicaid Other | Admitting: Pharmacist

## 2023-04-09 ENCOUNTER — Other Ambulatory Visit: Payer: Self-pay | Admitting: Family Medicine

## 2023-04-09 ENCOUNTER — Other Ambulatory Visit: Payer: Self-pay

## 2023-04-09 MED ORDER — NITROGLYCERIN 0.4 MG SL SUBL: 0.4000 mg | SUBLINGUAL_TABLET | SUBLINGUAL | 2 refills | Status: DC | PRN

## 2023-04-09 MED ORDER — METOPROLOL SUCCINATE ER 25 MG PO TB24: 25.0000 mg | ORAL_TABLET | Freq: Every day | ORAL | 3 refills | Status: DC

## 2023-04-09 MED ORDER — ATORVASTATIN CALCIUM 80 MG PO TABS: 80.0000 mg | ORAL_TABLET | Freq: Every day | ORAL | 3 refills | Status: DC

## 2023-04-09 MED ORDER — CLOPIDOGREL BISULFATE 75 MG PO TABS: 75.0000 mg | ORAL_TABLET | Freq: Every day | ORAL | 3 refills | Status: DC

## 2023-04-09 NOTE — Telephone Encounter (Signed)
Medication Refill - Medication: metFORMIN (GLUCOPHAGE) 1000 MG tablet , aspirin EC 81 MG tablet   Has the patient contacted their pharmacy? No.   Preferred Pharmacy (with phone number or street name):  [id number] - [place] - [address] Phone: [phone]  Fax: [phone]      Has the patient been seen for an appointment in the last year OR does the patient have an upcoming appointment? Yes.    Please assist patient further

## 2023-04-09 NOTE — Telephone Encounter (Signed)
Pt's medications were sent to pt's pharmacy as requested. Confirmation received.  

## 2023-04-10 NOTE — Telephone Encounter (Signed)
Requested medication (s) are due for refill today: routing for review  Requested medication (s) are on the active medication list: yes  Last refill:  03/06/23  Future visit scheduled: yes  Notes to clinic:  Unable to refill per protocol, last refill by ED provider.  Routing to PCP for approval.     Requested Prescriptions  Pending Prescriptions Disp Refills   metFORMIN (GLUCOPHAGE) 1000 MG tablet 180 tablet 2    Sig: Take 1 tablet (1,000 mg total) by mouth 2 (two) times daily at 8 am and 10 pm.     Endocrinology:  Diabetes - Biguanides Failed - 04/09/2023  4:19 PM      Failed - HBA1C is between 0 and 7.9 and within 180 days    HbA1c, POC (controlled diabetic range)  Date Value Ref Range Status  01/14/2021 7.9 (A) 0.0 - 7.0 % Final   Hgb A1c MFr Bld  Date Value Ref Range Status  03/06/2023 8.7 (H) 4.8 - 5.6 % Final    Comment:    (NOTE) Pre diabetes:          5.7%-6.4%  Diabetes:              >6.4%  Glycemic control for   <7.0% adults with diabetes          Failed - B12 Level in normal range and within 720 days    Vitamin B-12  Date Value Ref Range Status  12/21/2021 177 (L) 180 - 914 pg/mL Final    Comment:    (NOTE) This assay is not validated for testing neonatal or myeloproliferative syndrome specimens for Vitamin B12 levels. Performed at [facility], [address] [place], [place] [address]          Failed - CBC within normal limits and completed in the last 12 months    WBC  Date Value Ref Range Status  03/06/2023 4.9 4.0 - 10.5 K/uL Final   RBC  Date Value Ref Range Status  03/06/2023 3.08 (L) 3.87 - 5.11 MIL/uL Final   Hemoglobin  Date Value Ref Range Status  03/06/2023 8.1 (L) 12.0 - 15.0 g/dL Final  39/76/7341 93.7 (L) 11.1 - 15.9 g/dL Final   HCT  Date Value Ref Range Status  03/06/2023 24.4 (L) 36.0 - 46.0 % Final   Hematocrit  Date Value Ref Range Status  09/15/2021 31.9 (L) 34.0 - 46.6 % Final   MCHC  Date Value Ref Range  Status  03/06/2023 33.2 30.0 - 36.0 g/dL Final   [facility]  Date Value Ref Range Status  03/06/2023 26.3 26.0 - 34.0 pg Final   MCV  Date Value Ref Range Status  03/06/2023 79.2 (L) 80.0 - 100.0 fL Final  09/15/2021 80 79 - 97 fL Final   No results found for: "PLTCOUNTKUC", "LABPLAT", "POCPLA" RDW  Date Value Ref Range Status  03/06/2023 16.2 (H) 11.5 - 15.5 % Final  09/15/2021 15.3 11.7 - 15.4 % Final         Passed - Cr in normal range and within 360 days    Creatinine, Ser  Date Value Ref Range Status  03/06/2023 0.99 0.44 - 1.00 mg/dL Final   Creatinine, Urine  Date Value Ref Range Status  01/14/2022 82.45 mg/dL Final         Passed - eGFR in normal range and within 360 days    GFR calc Af Amer  Date Value Ref Range Status  07/15/2020 >60 >60 mL/min Final  GFR, Estimated  Date Value Ref Range Status  03/06/2023 >60 >60 mL/min Final    Comment:    (NOTE) Calculated using the CKD-EPI Creatinine Equation (2021)    eGFR  Date Value Ref Range Status  09/15/2021 128 >59 mL/min/1.73 Final         Passed - Valid encounter within last 6 months    Recent Outpatient Visits           1 month ago Type 2 diabetes mellitus with hyperglycemia, with long-term current use of insulin (HCC)   [facility] [facility], MD   5 months ago Essential hypertension   [place] [facility], Lauris Poag, MD   2 years ago Type 2 diabetes mellitus without complication, without long-term current use of insulin [facility])   [facility], Shea Stakes, NP       Future Appointments             In 3 weeks Tobb, Kardie, DO [place] [facility]   In 2 months Georganna Skeans, MD [facility]   In 2 months Tobb, Lavona Mound, DO [place] [facility]             aspirin EC 81 MG tablet 120 tablet 12    Sig: Take 1 tablet (81 mg total)  by mouth daily. Swallow whole.     Analgesics:  NSAIDS - aspirin Passed - 04/09/2023  4:19 PM      Passed - Cr in normal range and within 360 days    Creatinine, Ser  Date Value Ref Range Status  03/06/2023 0.99 0.44 - 1.00 mg/dL Final   Creatinine, Urine  Date Value Ref Range Status  01/14/2022 82.45 mg/dL Final         Passed - eGFR is 10 or above and within 360 days    GFR calc Af Amer  Date Value Ref Range Status  07/15/2020 >60 >60 mL/min Final   GFR, Estimated  Date Value Ref Range Status  03/06/2023 >60 >60 mL/min Final    Comment:    (NOTE) Calculated using the CKD-EPI Creatinine Equation (2021)    eGFR  Date Value Ref Range Status  09/15/2021 128 >59 mL/min/1.73 Final         Passed - Patient is not pregnant      Passed - Valid encounter within last 12 months    Recent Outpatient Visits           1 month ago Type 2 diabetes mellitus with hyperglycemia, with long-term current use of insulin (HCC)   [place] [facility], MD   5 months ago Essential hypertension   [place] [facility], MD   2 years ago Type 2 diabetes mellitus without complication, without long-term current use of insulin [facility])   [facility], Shea Stakes, NP       Future Appointments             In 3 weeks Tobb, Lavona Mound, DO [place] [facility]   In 2 months Georganna Skeans, MD [facility]   In 2 months Tobb, Lavona Mound, DO  [facility] at [facility]

## 2023-04-17 ENCOUNTER — Ambulatory Visit (HOSPITAL_COMMUNITY)
Admission: EM | Admit: 2023-04-17 | Discharge: 2023-04-17 | Disposition: A | Discharge status: Home / Self Care | Payer: 59 | Attending: Family Medicine | Admitting: Family Medicine

## 2023-04-17 ENCOUNTER — Encounter (HOSPITAL_COMMUNITY): Payer: Self-pay

## 2023-04-17 DIAGNOSIS — L239 Allergic contact dermatitis, unspecified cause: Secondary | ICD-10-CM

## 2023-04-17 DIAGNOSIS — L03119 Cellulitis of unspecified part of limb: Secondary | ICD-10-CM | POA: Diagnosis not present

## 2023-04-17 MED ORDER — METHYLPREDNISOLONE ACETATE 80 MG/ML IJ SUSP
INTRAMUSCULAR | Status: AC
  Filled 2023-04-17: qty 1

## 2023-04-17 MED ORDER — AMOXICILLIN-POT CLAVULANATE 875-125 MG PO TABS: 1.0000 | ORAL_TABLET | Freq: Two times a day (BID) | ORAL | 0 refills | Status: AC

## 2023-04-17 MED ORDER — METHYLPREDNISOLONE ACETATE 80 MG/ML IJ SUSP
80.0000 mg | Freq: Once | INTRAMUSCULAR | Status: AC
  Administered 2023-04-17: 80 mg via INTRAMUSCULAR

## 2023-04-17 MED ORDER — MUPIROCIN 2 % EX OINT: 1.0000 | TOPICAL_OINTMENT | Freq: Two times a day (BID) | CUTANEOUS | 0 refills | Status: DC
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
## 2023-04-17 NOTE — ED Triage Notes (Signed)
Pt presents with c/o bilateral hand pain, blisters and swelling X 2 days. States this mornings she has been itching and not being able to move her hands or fingers.

## 2023-04-17 NOTE — Discharge Instructions (Signed)
Take amoxicillin-clavulanate 875 mg--1 tab twice daily with food for 7 days  Put mupirocin ointment on the sore areas twice daily until improved  You have been given an injection of methylprednisolone 80 mg, steroid.  This can raise your sugars  If you are worsening or not improving in the next 24 to 48 hours, then please proceed to the emergency room for further evaluation

## 2023-04-20 ENCOUNTER — Telehealth: Payer: Self-pay

## 2023-04-20 NOTE — Telephone Encounter (Signed)
Called Patient to inquire about Metoprolol Succinate 25 MG ER to see if they are taking it no answer or voicemail. Named and keeping Fax in main [employer].

## 2023-05-03 ENCOUNTER — Ambulatory Visit: Payer: 59 | Attending: Cardiology | Admitting: Cardiology

## 2023-05-03 VITALS — BP 160/92 | HR 60 | Ht 62.0 in | Wt 202.2 lb

## 2023-05-03 DIAGNOSIS — I10 Essential (primary) hypertension: Secondary | ICD-10-CM | POA: Diagnosis not present

## 2023-05-03 DIAGNOSIS — Z8673 Personal history of transient ischemic attack (TIA), and cerebral infarction without residual deficits: Secondary | ICD-10-CM

## 2023-05-03 DIAGNOSIS — I214 Non-ST elevation (NSTEMI) myocardial infarction: Secondary | ICD-10-CM | POA: Diagnosis not present

## 2023-05-03 DIAGNOSIS — E119 Type 2 diabetes mellitus without complications: Secondary | ICD-10-CM

## 2023-05-03 DIAGNOSIS — I2510 Atherosclerotic heart disease of native coronary artery without angina pectoris: Secondary | ICD-10-CM | POA: Diagnosis not present

## 2023-05-03 DIAGNOSIS — Z7984 Long term (current) use of oral hypoglycemic drugs: Secondary | ICD-10-CM

## 2023-05-03 MED ORDER — CLOPIDOGREL BISULFATE 75 MG PO TABS: 75.0000 mg | ORAL_TABLET | Freq: Every day | ORAL | 3 refills | Status: DC

## 2023-05-03 MED ORDER — LOSARTAN POTASSIUM 25 MG PO TABS: 25.0000 mg | ORAL_TABLET | Freq: Every day | ORAL | 3 refills | Status: DC

## 2023-05-03 MED ORDER — ATORVASTATIN CALCIUM 80 MG PO TABS: 80.0000 mg | ORAL_TABLET | Freq: Every day | ORAL | 3 refills | Status: DC

## 2023-05-03 MED ORDER — ASPIRIN 81 MG PO TBEC: 81.0000 mg | DELAYED_RELEASE_TABLET | Freq: Every day | ORAL | 12 refills | Status: DC

## 2023-05-03 MED ORDER — METOPROLOL SUCCINATE ER 25 MG PO TB24: 25.0000 mg | ORAL_TABLET | Freq: Every day | ORAL | 3 refills | Status: DC
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
## 2023-05-03 NOTE — Patient Instructions (Addendum)
Medication Instructions:  STOP LISINOPRIL START LOSARTAN  *If you need a refill on your cardiac medications before your next appointment, please call your pharmacy*   Lab Work: None needed  If you have labs (blood work) drawn today and your tests are completely normal, you will receive your results only by: MyChart Message (if you have MyChart) OR A paper copy in the mail If you have any lab test that is abnormal or we need to change your treatment, we will call you to review the results.   Testing/Procedures: None needed   Follow-Up: At [facility], you and your health needs are our priority.  As part of our continuing mission to provide you with exceptional heart care, we have created designated Provider Care Teams.  These Care Teams include your primary Cardiologist (physician) and Advanced Practice Providers (APPs -  Physician Assistants and Nurse Practitioners) who all work together to provide you with the care you need, when you need it.  We recommend signing up for the patient portal called "MyChart".  Sign up information is provided on this After Visit Summary.  MyChart is used to connect with patients for Virtual Visits (Telemedicine).  Patients are able to view lab/test results, encounter notes, upcoming appointments, etc.  Non-urgent messages can be sent to your provider as well.   To learn more about what you can do with MyChart, go to [email or web address].    Your next appointment:   6 month(s)  Provider:   Thomasene Ripple, DO     Other Instructions Pleas schedule appt with pharm in 4 wks

## 2023-05-04 LAB — COMPREHENSIVE METABOLIC PANEL
ALT: 8 IU/L (ref 0–32)
AST: 13 IU/L (ref 0–40)
Albumin: 4 g/dL (ref 3.9–4.9)
Alkaline Phosphatase: 85 IU/L (ref 44–121)
BUN/Creatinine Ratio: 29 — ABNORMAL HIGH (ref 9–23)
BUN: 27 mg/dL — ABNORMAL HIGH (ref 6–20)
Bilirubin Total: 0.4 mg/dL (ref 0.0–1.2)
CO2: 23 mmol/L (ref 20–29)
Calcium: 9.2 mg/dL (ref 8.7–10.2)
Chloride: 105 mmol/L (ref 96–106)
Creatinine, Ser: 0.93 mg/dL (ref 0.57–1.00)
Globulin, Total: 2 g/dL (ref 1.5–4.5)
Glucose: 206 mg/dL — ABNORMAL HIGH (ref 70–99)
Potassium: 4.8 mmol/L (ref 3.5–5.2)
Sodium: 139 mmol/L (ref 134–144)
Total Protein: 6 g/dL (ref 6.0–8.5)
eGFR: 82 mL/min/{1.73_m2} (ref >59)

## 2023-05-04 LAB — MAGNESIUM: Magnesium: 1.9 mg/dL (ref 1.6–2.3)

## 2023-06-18 ENCOUNTER — Encounter (HOSPITAL_COMMUNITY): Payer: Self-pay

## 2023-06-18 ENCOUNTER — Ambulatory Visit (HOSPITAL_COMMUNITY)
Admission: EM | Admit: 2023-06-18 | Discharge: 2023-06-18 | Disposition: A | Discharge status: Home / Self Care | Payer: 59 | Attending: Internal Medicine | Admitting: Internal Medicine

## 2023-06-18 DIAGNOSIS — H53453 Other localized visual field defect, bilateral: Secondary | ICD-10-CM | POA: Diagnosis not present

## 2023-06-18 DIAGNOSIS — G4489 Other headache syndrome: Secondary | ICD-10-CM | POA: Diagnosis not present

## 2023-06-18 DIAGNOSIS — Z20822 Contact with and (suspected) exposure to covid-19: Secondary | ICD-10-CM
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
## 2023-06-18 NOTE — ED Triage Notes (Signed)
Patient states all her friends at work have Covid. Patient reports having a headache, blurry vision, and Hot flashes x 3 days.   Patient states she has been taking ibuprofen and the alst dose was last night.

## 2023-06-18 NOTE — ED Notes (Signed)
Patient is being discharged from the Urgent Care and sent to the Emergency Department via POV . Per Clance Boll, PA, patient is in need of higher level of care due to possible stroke symptoms. Patient is aware and verbalizes understanding of plan of care.

## 2023-06-18 NOTE — Discharge Instructions (Signed)
Recommend that you go to the ER for further evaluation given the symptoms that are concerning for a stroke with headache, peripheral vision loss in both eyes and right arm tingling.

## 2023-06-19 LAB — SARS CORONAVIRUS 2 (TAT 6-24 HRS): SARS Coronavirus 2: NEGATIVE

## 2023-06-25 ENCOUNTER — Ambulatory Visit: Payer: Medicaid Other | Admitting: Family Medicine

## 2023-06-29 DIAGNOSIS — Z823 Family history of stroke: Secondary | ICD-10-CM | POA: Diagnosis not present

## 2023-06-29 DIAGNOSIS — E1151 Type 2 diabetes mellitus with diabetic peripheral angiopathy without gangrene: Secondary | ICD-10-CM | POA: Diagnosis not present

## 2023-06-29 DIAGNOSIS — Z885 Allergy status to narcotic agent status: Secondary | ICD-10-CM | POA: Diagnosis not present

## 2023-06-29 DIAGNOSIS — E11620 Type 2 diabetes mellitus with diabetic dermatitis: Secondary | ICD-10-CM | POA: Diagnosis not present

## 2023-06-29 DIAGNOSIS — Z7902 Long term (current) use of antithrombotics/antiplatelets: Secondary | ICD-10-CM | POA: Diagnosis not present

## 2023-06-29 DIAGNOSIS — I2510 Atherosclerotic heart disease of native coronary artery without angina pectoris: Secondary | ICD-10-CM | POA: Diagnosis not present

## 2023-06-29 DIAGNOSIS — M1990 Unspecified osteoarthritis, unspecified site: Secondary | ICD-10-CM | POA: Diagnosis not present

## 2023-06-29 DIAGNOSIS — E785 Hyperlipidemia, unspecified: Secondary | ICD-10-CM | POA: Diagnosis not present

## 2023-06-29 DIAGNOSIS — E1122 Type 2 diabetes mellitus with diabetic chronic kidney disease: Secondary | ICD-10-CM | POA: Diagnosis not present

## 2023-06-29 DIAGNOSIS — I10 Essential (primary) hypertension: Secondary | ICD-10-CM | POA: Diagnosis not present

## 2023-06-29 DIAGNOSIS — Z8673 Personal history of transient ischemic attack (TIA), and cerebral infarction without residual deficits: Secondary | ICD-10-CM | POA: Diagnosis not present

## 2023-06-29 DIAGNOSIS — Z833 Family history of diabetes mellitus: Secondary | ICD-10-CM | POA: Diagnosis not present

## 2023-07-01 ENCOUNTER — Emergency Department (HOSPITAL_COMMUNITY): Payer: 59

## 2023-07-01 ENCOUNTER — Emergency Department (HOSPITAL_COMMUNITY)
Admission: EM | Admit: 2023-07-01 | Discharge: 2023-07-01 | Disposition: A | Discharge status: Home / Self Care | Payer: 59 | Attending: Emergency Medicine | Admitting: Emergency Medicine

## 2023-07-01 DIAGNOSIS — Z7902 Long term (current) use of antithrombotics/antiplatelets: Secondary | ICD-10-CM | POA: Diagnosis not present

## 2023-07-01 DIAGNOSIS — R4781 Slurred speech: Secondary | ICD-10-CM | POA: Diagnosis not present

## 2023-07-01 DIAGNOSIS — I700 Atherosclerosis of aorta: Secondary | ICD-10-CM | POA: Diagnosis not present

## 2023-07-01 DIAGNOSIS — I10 Essential (primary) hypertension: Secondary | ICD-10-CM | POA: Diagnosis not present

## 2023-07-01 DIAGNOSIS — R079 Chest pain, unspecified: Secondary | ICD-10-CM | POA: Diagnosis not present

## 2023-07-01 DIAGNOSIS — R0789 Other chest pain: Secondary | ICD-10-CM | POA: Diagnosis not present

## 2023-07-01 DIAGNOSIS — I6523 Occlusion and stenosis of bilateral carotid arteries: Secondary | ICD-10-CM | POA: Diagnosis not present

## 2023-07-01 DIAGNOSIS — R531 Weakness: Secondary | ICD-10-CM | POA: Insufficient documentation

## 2023-07-01 DIAGNOSIS — E041 Nontoxic single thyroid nodule: Secondary | ICD-10-CM | POA: Diagnosis not present

## 2023-07-01 DIAGNOSIS — Z7982 Long term (current) use of aspirin: Secondary | ICD-10-CM | POA: Diagnosis not present

## 2023-07-01 DIAGNOSIS — Z7984 Long term (current) use of oral hypoglycemic drugs: Secondary | ICD-10-CM | POA: Diagnosis not present

## 2023-07-01 DIAGNOSIS — Z79899 Other long term (current) drug therapy: Secondary | ICD-10-CM | POA: Insufficient documentation

## 2023-07-01 DIAGNOSIS — F447 Conversion disorder with mixed symptom presentation: Secondary | ICD-10-CM

## 2023-07-01 DIAGNOSIS — E119 Type 2 diabetes mellitus without complications: Secondary | ICD-10-CM | POA: Insufficient documentation

## 2023-07-01 DIAGNOSIS — R5383 Other fatigue: Secondary | ICD-10-CM | POA: Insufficient documentation

## 2023-07-01 DIAGNOSIS — Z794 Long term (current) use of insulin: Secondary | ICD-10-CM | POA: Diagnosis not present

## 2023-07-01 DIAGNOSIS — R200 Anesthesia of skin: Secondary | ICD-10-CM | POA: Insufficient documentation

## 2023-07-01 LAB — RAPID URINE DRUG SCREEN, HOSP PERFORMED
Amphetamines: NOT DETECTED
Barbiturates: NOT DETECTED
Benzodiazepines: NOT DETECTED
Cocaine: NOT DETECTED
Opiates: NOT DETECTED
Tetrahydrocannabinol: NOT DETECTED

## 2023-07-01 LAB — URINALYSIS, ROUTINE W REFLEX MICROSCOPIC
Bilirubin Urine: NEGATIVE
Glucose, UA: NEGATIVE mg/dL
Hgb urine dipstick: NEGATIVE
Ketones, ur: NEGATIVE mg/dL
Leukocytes,Ua: NEGATIVE
Nitrite: NEGATIVE
Protein, ur: 100 mg/dL — AB
Specific Gravity, Urine: <1.005 — ABNORMAL LOW (ref 1.005–1.030)
pH: 6 (ref 5.0–8.0)

## 2023-07-01 LAB — I-STAT CHEM 8, ED
BUN: 17 mg/dL (ref 6–20)
Calcium, Ion: 1.21 mmol/L (ref 1.15–1.40)
Chloride: 106 mmol/L (ref 98–111)
Creatinine, Ser: 0.9 mg/dL (ref 0.44–1.00)
Glucose, Bld: 187 mg/dL — ABNORMAL HIGH (ref 70–99)
HCT: 26 % — ABNORMAL LOW (ref 36.0–46.0)
Hemoglobin: 8.8 g/dL — ABNORMAL LOW (ref 12.0–15.0)
Potassium: 4.2 mmol/L (ref 3.5–5.1)
Sodium: 139 mmol/L (ref 135–145)
TCO2: 23 mmol/L (ref 22–32)

## 2023-07-01 LAB — COMPREHENSIVE METABOLIC PANEL
ALT: 16 U/L (ref 0–44)
AST: 15 U/L (ref 15–41)
Albumin: 3.7 g/dL (ref 3.5–5.0)
Alkaline Phosphatase: 52 U/L (ref 38–126)
Anion gap: 12 (ref 5–15)
BUN: 16 mg/dL (ref 6–20)
CO2: 22 mmol/L (ref 22–32)
Calcium: 9.3 mg/dL (ref 8.9–10.3)
Chloride: 104 mmol/L (ref 98–111)
Creatinine, Ser: 0.91 mg/dL (ref 0.44–1.00)
GFR, Estimated: >60 mL/min (ref >60)
Glucose, Bld: 191 mg/dL — ABNORMAL HIGH (ref 70–99)
Potassium: 4.1 mmol/L (ref 3.5–5.1)
Sodium: 138 mmol/L (ref 135–145)
Total Bilirubin: 1 mg/dL (ref 0.3–1.2)
Total Protein: 6.5 g/dL (ref 6.5–8.1)

## 2023-07-01 LAB — CBC
HCT: 28.3 % — ABNORMAL LOW (ref 36.0–46.0)
Hemoglobin: 9.2 g/dL — ABNORMAL LOW (ref 12.0–15.0)
MCH: 26.5 pg (ref 26.0–34.0)
MCHC: 32.5 g/dL (ref 30.0–36.0)
MCV: 81.6 fL (ref 80.0–100.0)
Platelets: 152 10*3/uL (ref 150–400)
RBC: 3.47 MIL/uL — ABNORMAL LOW (ref 3.87–5.11)
RDW: 15.7 % — ABNORMAL HIGH (ref 11.5–15.5)
WBC: 6 10*3/uL (ref 4.0–10.5)
nRBC: 0 % (ref 0.0–0.2)

## 2023-07-01 LAB — URINALYSIS, MICROSCOPIC (REFLEX)

## 2023-07-01 LAB — CBG MONITORING, ED: Glucose-Capillary: 185 mg/dL — ABNORMAL HIGH (ref 70–99)

## 2023-07-01 LAB — DIFFERENTIAL
Abs Immature Granulocytes: 0.02 10*3/uL (ref 0.00–0.07)
Basophils Absolute: 0.1 10*3/uL (ref 0.0–0.1)
Basophils Relative: 1 %
Eosinophils Absolute: 0.2 10*3/uL (ref 0.0–0.5)
Eosinophils Relative: 3 %
Immature Granulocytes: 0 %
Lymphocytes Relative: 19 %
Lymphs Abs: 1.1 10*3/uL (ref 0.7–4.0)
Monocytes Absolute: 0.5 10*3/uL (ref 0.1–1.0)
Monocytes Relative: 8 %
Neutro Abs: 4.1 10*3/uL (ref 1.7–7.7)
Neutrophils Relative %: 69 %

## 2023-07-01 LAB — PROTIME-INR
INR: 1 (ref 0.8–1.2)
Prothrombin Time: 13.2 seconds (ref 11.4–15.2)

## 2023-07-01 LAB — TROPONIN I (HIGH SENSITIVITY): Troponin I (High Sensitivity): 7 ng/L (ref <18)

## 2023-07-01 LAB — APTT: aPTT: 26 s (ref 24–36)

## 2023-07-01 LAB — ETHANOL: Alcohol, Ethyl (B): <10 mg/dL (ref <10)

## 2023-07-01 MED ORDER — IOHEXOL 350 MG/ML SOLN
175.0000 mL | Freq: Once | INTRAVENOUS | Status: AC | PRN
  Administered 2023-07-01: 175 mL via INTRAVENOUS

## 2023-07-01 NOTE — ED Notes (Signed)
Pt ambulatory to restroom at this time. No deficits noted. Pt ambulating unassisted with steady gait.

## 2023-07-01 NOTE — Discharge Instructions (Addendum)
You were seen today for weakness on your left side.  Your imaging did not show any signs of stroke.  It is very importantly take her medications every day.  Please make an appointment to follow-up with both your primary care and your neurologist.

## 2023-07-01 NOTE — ED Notes (Signed)
[phone] Vashti Hey, pt brother called for a update, he informed me that mom is over seas so she won't be able to take phone calls.
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
## 2023-07-01 NOTE — ED Provider Notes (Incomplete)
[name] EMERGENCY DEPARTMENT AT [facility] Provider Note   CSN: [id number] Arrival date & time: 07/01/23  2051     History {Add pertinent medical, surgical, social history, OB history to HPI:1} No chief complaint on file.   Joann Meyers is a 37 y.o. female.  HPI     Home Medications Prior to Admission medications   Medication Sig Start Date End Date Taking? Authorizing Provider  acetaminophen (TYLENOL) 500 MG tablet Take 1,000 mg by mouth every 6 (six) hours as needed for moderate pain.    [provider]  aspirin EC 81 MG tablet Take 1 tablet (81 mg total) by mouth daily. Swallow whole. 05/03/23   Tobb, Kardie, DO  atorvastatin (LIPITOR) 80 MG tablet Take 1 tablet (80 mg total) by mouth daily. 05/03/23   Tobb, Kardie, DO  clopidogrel (PLAVIX) 75 MG tablet Take 1 tablet (75 mg total) by mouth daily. 05/03/23   Tobb, Kardie, DO  glipiZIDE (GLUCOTROL) 5 MG tablet Take 1 tablet (5 mg total) by mouth 2 (two) times daily before a meal. 03/06/23   Abagail Kitchens, PA-C  losartan (COZAAR) 25 MG tablet Take 1 tablet (25 mg total) by mouth daily. 05/03/23   Tobb, Kardie, DO  metFORMIN (GLUCOPHAGE) 1000 MG tablet Take 1 tablet (1,000 mg total) by mouth 2 (two) times daily at 8 am and 10 pm. 03/06/23   Abagail Kitchens, PA-C  metoprolol succinate (TOPROL-XL) 25 MG 24 hr tablet Take 1 tablet (25 mg total) by mouth daily. 05/03/23   Tobb, Kardie, DO  mupirocin ointment (BACTROBAN) 2 % Apply 1 Application topically 2 (two) times daily. To affected area till better 04/17/23   Zenia Resides, MD  nitroGLYCERIN (NITROSTAT) 0.4 MG SL tablet Place 1 tablet (0.4 mg total) under the tongue every 5 (five) minutes as needed for chest pain. 04/09/23   Ronney Asters, NP  insulin NPH Human (NOVOLIN N) 100 UNIT/ML injection Inject 0.1 mLs (10 Units total) into the skin 2 (two) times daily. Take at breakfast and at hs 10/13/21 10/27/21  Reva Bores, MD  metoCLOPramide (REGLAN) 10 MG tablet  Take 1 tablet (10 mg total) by mouth 4 (four) times daily as needed for nausea or vomiting. 08/04/20 01/14/21  Tereso Newcomer, MD  prochlorperazine (COMPAZINE) 10 MG tablet Take 1 tablet (10 mg total) by mouth 2 (two) times daily as needed for nausea or vomiting. 04/25/19 08/12/19  Tegeler, Canary Brim, MD      Allergies    Fish-derived products, Morphine and codeine, Shellfish allergy, Tuna [fish allergy], Feraheme [ferumoxytol], Latex, and Pork-derived products    Review of Systems   Review of Systems  Physical Exam Updated Vital Signs LMP 06/18/2023  Physical Exam  ED Results / Procedures / Treatments   Labs (all labs ordered are listed, but only abnormal results are displayed) Labs Reviewed  CBG MONITORING, ED - Abnormal; Notable for the following components:      Result Value   Glucose-Capillary 185 (*)    All other components within normal limits  ETHANOL  PROTIME-INR  APTT  CBC  DIFFERENTIAL  COMPREHENSIVE METABOLIC PANEL  RAPID URINE DRUG SCREEN, HOSP PERFORMED  URINALYSIS, ROUTINE W REFLEX MICROSCOPIC  HCG, SERUM, QUALITATIVE  I-STAT CHEM 8, ED    EKG None  Radiology No results found.  Procedures Procedures  {Document cardiac monitor, telemetry assessment procedure when appropriate:1}  Medications Ordered in ED Medications - No data to display  ED Course/ Medical Decision  Making/ A&P   {   Click here for ABCD2, HEART and other calculatorsREFRESH Note before signing :1}                              Medical Decision Making Amount and/or Complexity of Data Reviewed Labs: ordered. Radiology: ordered.   ***  {Document critical care time when appropriate:1} {Document review of labs and clinical decision tools ie heart score, Chads2Vasc2 etc:1}  {Document your independent review of radiology images, and any outside records:1} {Document your discussion with family members, caretakers, and with consultants:1} {Document social determinants of health  affecting pt's care:1} {Document your decision making why or why not admission, treatments were needed:1} Final Clinical Impression(s) / ED Diagnoses Final diagnoses:  None    Rx / DC Orders ED Discharge Orders     None

## 2023-07-01 NOTE — ED Triage Notes (Signed)
Pt BIB [facility] from home with c/o chest pain and left sided numbness. Pre-hospital activation as Code Stroke. LNKT 2000. Pt's husband reported to EMS that pt states she had chest pain and feeling numb to left side around 2000. Pt then went into kitchen and husband later found pt laying on floor in front of oven, on her left side and curled into a ball. Pt also having slurred speech. LVO 3.   EMS Vitals: BP 148/70 HR 80 RR 20 O2 99% room air CBG 201

## 2023-07-01 NOTE — Code Documentation (Signed)
Responded to Code Stroke called at 2046 for L sided weakness/numbness, LSN-2000. Pt arrived at 2051, CBG-185, NIH-15, CT head negative for acute changes, CTA-no LVO(Pt has known R ICA occlusion). Pt transported to MRI at 2126. MRI negative for stroke. Code stroke cancelled at 2142.

## 2023-07-02 ENCOUNTER — Ambulatory Visit: Payer: Medicaid Other | Admitting: Cardiology

## 2023-07-03 NOTE — ED Provider Notes (Signed)
[place] EMERGENCY DEPARTMENT AT [facility] Provider Note   CSN: [id number] Arrival date & time: 07/01/23  2051     History  Chief Complaint  Patient presents with   Chest Pain   Numbness    Joann Meyers is a 37 y.o. female.   Chest Pain  Per report from patient, family, EMS, she was at her baseline earlier today but later in the evening developed acute onset of chest pain and left-sided numbness.  She was found by her family laying on the floor curled into a ball.  She is having difficulty providing any additional history.    Home Medications Prior to Admission medications   Medication Sig Start Date End Date Taking? Authorizing Provider  acetaminophen (TYLENOL) 500 MG tablet Take 1,000 mg by mouth every 6 (six) hours as needed for moderate pain.    [provider]  aspirin EC 81 MG tablet Take 1 tablet (81 mg total) by mouth daily. Swallow whole. 05/03/23   Tobb, Kardie, DO  atorvastatin (LIPITOR) 80 MG tablet Take 1 tablet (80 mg total) by mouth daily. 05/03/23   Tobb, Kardie, DO  clopidogrel (PLAVIX) 75 MG tablet Take 1 tablet (75 mg total) by mouth daily. 05/03/23   Tobb, Kardie, DO  glipiZIDE (GLUCOTROL) 5 MG tablet Take 1 tablet (5 mg total) by mouth 2 (two) times daily before a meal. 03/06/23   Abagail Kitchens, PA-C  losartan (COZAAR) 25 MG tablet Take 1 tablet (25 mg total) by mouth daily. 05/03/23   Tobb, Kardie, DO  metFORMIN (GLUCOPHAGE) 1000 MG tablet Take 1 tablet (1,000 mg total) by mouth 2 (two) times daily at 8 am and 10 pm. 03/06/23   Abagail Kitchens, PA-C  metoprolol succinate (TOPROL-XL) 25 MG 24 hr tablet Take 1 tablet (25 mg total) by mouth daily. 05/03/23   Tobb, Kardie, DO  mupirocin ointment (BACTROBAN) 2 % Apply 1 Application topically 2 (two) times daily. To affected area till better 04/17/23   Zenia Resides, MD  nitroGLYCERIN (NITROSTAT) 0.4 MG SL tablet Place 1 tablet (0.4 mg total) under the tongue every 5 (five) minutes as needed  for chest pain. 04/09/23   Ronney Asters, NP  insulin NPH Human (NOVOLIN N) 100 UNIT/ML injection Inject 0.1 mLs (10 Units total) into the skin 2 (two) times daily. Take at breakfast and at hs 10/13/21 10/27/21  Reva Bores, MD  metoCLOPramide (REGLAN) 10 MG tablet Take 1 tablet (10 mg total) by mouth 4 (four) times daily as needed for nausea or vomiting. 08/04/20 01/14/21  Tereso Newcomer, MD  prochlorperazine (COMPAZINE) 10 MG tablet Take 1 tablet (10 mg total) by mouth 2 (two) times daily as needed for nausea or vomiting. 04/25/19 08/12/19  Tegeler, Canary Brim, MD      Allergies    Fish-derived products, Morphine and codeine, Shellfish allergy, Tuna [fish allergy], Feraheme [ferumoxytol], Latex, and Pork-derived products    Review of Systems   Review of Systems  Cardiovascular:  Positive for chest pain.    Physical Exam Updated Vital Signs BP (!) 177/75 (BP Location: Right Arm)   Pulse 65   Temp 97.7 F (36.5 C) (Oral)   Resp 20   Wt 90.8 kg   LMP 06/18/2023   SpO2 100%   BMI 36.61 kg/m  Physical Exam Vitals and nursing note reviewed.  Constitutional:      General: She is in acute distress.     Appearance: She is ill-appearing.  HENT:  Head: Normocephalic and atraumatic.  Eyes:     Conjunctiva/sclera: Conjunctivae normal.  Cardiovascular:     Rate and Rhythm: Normal rate and regular rhythm.     Heart sounds: No murmur heard. Pulmonary:     Effort: Pulmonary effort is normal. No respiratory distress.     Breath sounds: Normal breath sounds.  Abdominal:     Palpations: Abdomen is soft.     Tenderness: There is no abdominal tenderness.  Musculoskeletal:        General: No swelling.     Cervical back: Neck supple.  Neurological:     Mental Status: She is lethargic.     Cranial Nerves: Dysarthria present. No facial asymmetry.     Motor: Weakness present.  Psychiatric:        Mood and Affect: Mood normal.     ED Results / Procedures / Treatments    Labs (all labs ordered are listed, but only abnormal results are displayed) Labs Reviewed  CBC - Abnormal; Notable for the following components:      Result Value   RBC 3.47 (*)    Hemoglobin 9.2 (*)    HCT 28.3 (*)    RDW 15.7 (*)    All other components within normal limits  COMPREHENSIVE METABOLIC PANEL - Abnormal; Notable for the following components:   Glucose, Bld 191 (*)    All other components within normal limits  URINALYSIS, ROUTINE W REFLEX MICROSCOPIC - Abnormal; Notable for the following components:   Specific Gravity, Urine <1.005 (*)    Protein, ur 100 (*)    All other components within normal limits  URINALYSIS, MICROSCOPIC (REFLEX) - Abnormal; Notable for the following components:   Bacteria, UA RARE (*)    All other components within normal limits  I-STAT CHEM 8, ED - Abnormal; Notable for the following components:   Glucose, Bld 187 (*)    Hemoglobin 8.8 (*)    HCT 26.0 (*)    All other components within normal limits  CBG MONITORING, ED - Abnormal; Notable for the following components:   Glucose-Capillary 185 (*)    All other components within normal limits  ETHANOL  PROTIME-INR  APTT  DIFFERENTIAL  RAPID URINE DRUG SCREEN, HOSP PERFORMED  TROPONIN I (HIGH SENSITIVITY)  TROPONIN I (HIGH SENSITIVITY)    EKG None  Radiology MR BRAIN WO CONTRAST  Result Date: 07/01/2023 CLINICAL DATA:  Left-sided weakness EXAM: MRI HEAD WITHOUT CONTRAST TECHNIQUE: Multiplanar, multiecho pulse sequences of the brain and surrounding structures were obtained without intravenous contrast. COMPARISON:  12/11/2021 FINDINGS: Brain: No acute infarct, mass effect or extra-axial collection. No chronic microhemorrhage or siderosis. There is multifocal hyperintense T2-weighted signal within the white matter. Parenchymal volume and CSF spaces are normal. 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 The midline structures are normal. Vascular: Abnormal right ICA flow void consistent with  known occlusion. Skull and upper cervical spine: Normal marrow signal. Sinuses/Orbits: Paranasal sinuses are clear. No mastoid effusion. Normal orbits. Left ocular lens replacement. Other: None IMPRESSION: 1. No acute intracranial abnormality. 2. 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 3. Abnormal right ICA flow void consistent with known occlusion. Electronically Signed   By: Deatra Robinson M.D.   On: 07/01/2023 22:11   CT Angio Chest/Abd/Pel for Dissection W and/or W/WO  Result Date: 07/01/2023 CLINICAL DATA:  Chest pain EXAM: CT ANGIOGRAPHY CHEST, ABDOMEN AND PELVIS TECHNIQUE: Non-contrast CT of the chest was initially obtained. Multidetector CT imaging through the chest, abdomen and pelvis was performed using the standard protocol  during bolus administration of intravenous contrast. Multiplanar reconstructed images and MIPs were obtained and reviewed to evaluate the vascular anatomy. RADIATION DOSE REDUCTION: This exam was performed according to the departmental dose-optimization program which includes automated exposure control, adjustment of the mA and/or kV according to patient size and/or use of iterative reconstruction technique. CONTRAST:  OMNIPAQUE IOHEXOL 350 MG/ML SOLN COMPARISON:  03/05/2023 FINDINGS: CTA CHEST FINDINGS Cardiovascular: --Heart: The heart size is normal.  There is nopericardial effusion. --Aorta: The course and caliber of the thoracic aorta are normal. There is no aortic atherosclerotic calcification. Precontrast images show no aortic intramural hematoma. There is no blood pool, dissection or penetrating ulcer demonstrated on arterial phase postcontrast imaging. Normal variant aortic arch branching pattern with the left vertebral artery arising independently from the aortic arch. The proximal arch vessels are widely patent. --Pulmonary Arteries: Contrast timing is optimized for preferential opacification of the aorta. Within that limitation, normal central pulmonary arteries.  Mediastinum/Nodes: No mediastinal, hilar or axillary lymphadenopathy. Right thyroid nodule characterized on concomitant CTA of the head and neck. Lungs/Pleura: No pulmonary nodules or masses. No pleural effusion or pneumothorax. No focal airspace consolidation. No focal pleural abnormality. Musculoskeletal: No chest wall abnormality. No acute osseous findings. Review of the MIP images confirms the above findings. CTA ABDOMEN AND PELVIS FINDINGS VASCULAR Aorta: Normal caliber aorta without aneurysm, dissection, vasculitis or hemodynamically significant stenosis. There is mild aortic atherosclerosis. Celiac: No aneurysm, dissection or hemodynamically significant stenosis. Normal branching pattern. SMA: Widely patent without dissection or stenosis. Renals: Single renal arteries bilaterally. No aneurysm, dissection, stenosis or evidence of fibromuscular dysplasia. IMA: Severe stenosis of the IMA origin, but patent and normal caliber distally. Inflow: Mild atherosclerosis without stenosis or other abnormality. Veins: Normal course and caliber of the major veins. Assessment is otherwise limited by the arterial dominant contrast phase. Review of the MIP images confirms the above findings. NON-VASCULAR Hepatobiliary: Normal hepatic contours and density. No visible biliary dilatation. Normal gallbladder. Pancreas: Normal contours without ductal dilatation. No peripancreatic fluid collection. Spleen: Normal arterial phase splenic enhancement pattern. Adrenals/Urinary Tract: --Adrenal glands: Normal. --Right kidney/ureter: No hydronephrosis or perinephric stranding. No nephrolithiasis. No obstructing ureteral stones. --Left kidney/ureter: No hydronephrosis or perinephric stranding. No nephrolithiasis. No obstructing ureteral stones. --Urinary bladder: Unremarkable. Stomach/Bowel: --Stomach/Duodenum: No hiatal hernia or other gastric abnormality. Normal duodenal course and caliber. --Small bowel: No dilatation or inflammation.  --Colon: No focal abnormality. --Appendix: Normal. Lymphatic:  No abdominal or pelvic lymphadenopathy. Reproductive: Normal uterus and ovaries. Musculoskeletal. No bony spinal canal stenosis or focal osseous abnormality. Other: None. Review of the MIP images confirms the above findings. IMPRESSION: 1. No acute aortic syndrome. 2. No acute abnormality of the chest, abdomen or pelvis. 3. 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 4. Right thyroid nodule characterized on concomitant CTA of the head and neck. Electronically Signed   By: Deatra Robinson M.D.   On: 07/01/2023 21:54   CT ANGIO HEAD NECK W WO CM (CODE STROKE)  Result Date: 07/01/2023 CLINICAL DATA:  Left-sided weakness EXAM: CT ANGIOGRAPHY HEAD AND NECK WITH AND WITHOUT CONTRAST TECHNIQUE: Multidetector CT imaging of the head and neck was performed using the standard protocol during bolus administration of intravenous contrast. Multiplanar CT image reconstructions and MIPs were obtained to evaluate the vascular anatomy. Carotid stenosis measurements (when applicable) are obtained utilizing NASCET criteria, using the distal internal carotid diameter as the denominator. RADIATION DOSE REDUCTION: This exam was performed according to the departmental dose-optimization  program which includes automated exposure control, adjustment of the mA and/or kV according to patient size and/or use of iterative reconstruction technique. CONTRAST:  OMNIPAQUE IOHEXOL 350 MG/ML SOLN COMPARISON:  CTA head neck 01/17/2023 FINDINGS: CTA NECK FINDINGS Aortic arch: There is a normal variant branching pattern with an independent origin of the left vertebral artery. Visible portion of the aorta on this study is normal. Right carotid system: There is occlusion of the right ICA at its origin, which is unchanged. The right common carotid artery is normal. Left carotid system: Normal Vertebral arteries: Codominant. Both vertebral arteries  are normal from their origins to the vertebrobasilar confluence. Skeleton: No skeletal abnormality. Other neck: Hypodense right thyroid nodule measuring 2.4 cm. Upper chest: Lung apices are clear. Review of the MIP images confirms the above findings CTA HEAD FINDINGS Anterior circulation: Skull base segments of the left ICA are mildly narrowed, particularly at the clinoid segment. The right ICA remains occluded at the skull base. The right MCA branches are supplied via collateral pathways. The distal branches are patent. The left MCA is normal. The anterior cerebral arteries are normal. Posterior circulation: V4 segment vertebral arteries, inferior cerebellar arteries and basilar artery are normal. The superior cerebellar arteries are normal. There is unchanged mild stenosis of the proximal left P2 segment. Otherwise, both posterior cerebral arteries are normal. The left is partially supplied by the P-comm. Venous sinuses: Normal Anatomic variants: Absent right ACA A1 segment Review of the MIP images confirms the above findings IMPRESSION: 1. 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 2. Mild narrowing of the intracranial left ICA, particularly at the clinoid segment, also unchanged. 3. Unchanged mild narrowing of the proximal left P2 segment. 4. Incidental right thyroid nodule measuring 2.4 cm. Recommend non-emergent thyroid ultrasound. Reference: J Am Coll Radiol. 2015 Feb;12(2): 143-50 Electronically Signed   By: Deatra Robinson M.D.   On: 07/01/2023 21:46   CT HEAD CODE STROKE WO CONTRAST  Result Date: 07/01/2023 CLINICAL DATA:  Code stroke. Left-sided weakness, numbness and slurred speech EXAM: CT HEAD WITHOUT CONTRAST TECHNIQUE: Contiguous axial images were obtained from the base of the skull through the vertex without intravenous contrast. RADIATION DOSE REDUCTION: This exam was performed according to the departmental dose-optimization program which includes automated exposure control, adjustment  of the mA and/or kV according to patient size and/or use of iterative reconstruction technique. COMPARISON:  None Available. FINDINGS: Brain: No mass, hemorrhage or extra-axial collection. Patchy hypoattenuation in the right frontal lobe is unchanged. Brain parenchyma is otherwise unremarkable. Normal CSF spaces. Vascular: No hyperdense vessel or unexpected calcification. Skull: Normal Sinuses/Orbits: Paranasal sinuses are clear. No mastoid effusion. Normal orbits. Other: None ASPECTS ([place] Stroke Program Early CT Score) - Ganglionic level infarction (caudate, lentiform nuclei, internal capsule, insula, M1-M3 cortex): 7 - Supraganglionic infarction (M4-M6 cortex): 3 Total score (0-10 with 10 being normal): 10 IMPRESSION: 1. No acute intracranial abnormality. 2. ASPECTS is 10. These results were communicated to Dr. Erick Blinks at 9:12 pm on 07/01/2023 by text page via the [facility] messaging system. Electronically Signed   By: Deatra Robinson M.D.   On: 07/01/2023 21:12    Procedures Procedures    Medications Ordered in ED Medications  iohexol (OMNIPAQUE) 350 MG/ML injection 175 mL (175 mLs Intravenous Contrast Given 07/01/23 2133)    ED Course/ Medical Decision Making/ A&P  Medical Decision Making Amount and/or Complexity of Data Reviewed Labs: ordered. Radiology: ordered.   Patient is a 37 year old female with a history of carotid artery dissection, hypertension, migraines, T2DM, seizures presenting for altered mental status and numbness.  On my initial evaluation, she is afebrile and hemodynamically stable.  She is protecting her airway.  Per report obtained from patient and family, shortly prior to ED presentation she had acute onset of chest pain and numbness in left upper and lower extremities.  On my exam, she is unable to lift left upper or lower extremity from the bed.  She is lethargic.  Given patient's acute onset of focal neurodeficit, she initially  arrived to the emergency department as a code stroke.  She was evaluated by neurology and taken immediately for imaging.  Additionally considered dissection and obtained CTs accordingly.  Results reviewed.  CBC without leukocytosis, anemia present with hemoglobin 9.2.  UA noninfectious.  UDS negative.  Alcohol negative.  CTA of the chest demonstrates no acute change to the aorta.  Severe stenosis of the IMA, unchanged from prior.  MRI brain is without acute abnormality.  Does demonstrate old infarct.  Patient reassessed following the above workup.  Her symptoms now resolved.  She is able to ambulate to the bathroom.  Given presence of old right-sided strokes, feel this may be reactivation of her prior symptoms related to stress, hypertension, possible seizure.  She has not noted to have any seizure-like activity to her family therefore did not administer any epileptic medications.  Did recommend outpatient follow-up with neurology.  Given her improvement in symptoms as well as reassuring workup, I feel that she is appropriate for discharge and outpatient follow-up.  Return precautions given.  Recommend compliance with her home medications.  Patient reports understanding and agreement.  Discharged without further acute event under my care in the emergency department.        Final Clinical Impression(s) / ED Diagnoses Final diagnoses:  Weakness    Rx / DC Orders ED Discharge Orders     None         Claretha Cooper, DO 07/03/23 0058    Charlynne Pander, MD 07/04/23 1134

## 2023-07-09 ENCOUNTER — Telehealth: Payer: Self-pay | Admitting: *Deleted

## 2023-07-09 ENCOUNTER — Ambulatory Visit: Payer: Self-pay | Admitting: Licensed Clinical Social Worker

## 2023-07-09 NOTE — Progress Notes (Signed)
Care Coordination   Note   07/09/2023 Name: Charlese Ashburn MRN: [id number] DOB: 10-16-1986  Ranaya Knoblauch is a 37 y.o. year old female who sees Georganna Skeans, MD for primary care. I reached out to [place] Geary by phone today to offer care coordination services.  Ms. Loring was given information about Care Coordination services today including:   The Care Coordination services include support from the care team which includes your Nurse Coordinator, Clinical Social Worker, or Pharmacist.  The Care Coordination team is here to help remove barriers to the health concerns and goals most important to you. Care Coordination services are voluntary, and the patient may decline or stop services at any time by request to their care team member.   Care Coordination Consent Status: Patient agreed to services and verbal consent obtained.   Follow up plan:  Telephone appointment with care coordination team member scheduled for:  07/09/23  Encounter Outcome:  Patient Scheduled  [facility] Coordination Care Guide  Direct Dial: [phone]

## 2023-07-10 NOTE — Patient Instructions (Signed)
Visit Information  Thank you for taking time to visit with me today. Please don't hesitate to contact me if I can be of assistance to you.   Following are the goals we discussed today:   Goals Addressed             This Visit's Progress    Obtain Supportive Resources   On track    Activities and task to complete in order to accomplish goals.   Keep all upcoming appointments discussed today Continue with compliance of taking medication prescribed by Doctor Implement healthy coping skills discussed to assist with management of symptoms         Our next appointment is by telephone on 10/03 at 3:30 pm  Please call the care guide team at [phone] if you need to cancel or reschedule your appointment.   If you are experiencing a Mental Health or Behavioral Health Crisis or need someone to talk to, please call the Suicide and Crisis Lifeline: 988 call 911   Patient verbalizes understanding of instructions and care plan provided today and agrees to view in MyChart. Active MyChart status and patient understanding of how to access instructions and care plan via MyChart confirmed with patient.     Jenel Lucks, MSW, LCSW [facility] Care Management [place]  [facility] [email or web address] Phone [phone] 6:38 PM

## 2023-07-10 NOTE — Patient Outreach (Signed)
Care Coordination   Initial Visit Note   07/09/2023 Name: Joann Meyers MRN: [id number] DOB: 04/15/1986  Joann Meyers is a 37 y.o. year old female who sees Georganna Skeans, MD for primary care. I spoke with  Joann Meyers by phone today.  What matters to the patients health and wellness today?  Symptom Management and PCP appt    Goals Addressed             This Visit's Progress    Obtain Supportive Resources   On track    Activities and task to complete in order to accomplish goals.   Keep all upcoming appointments discussed today Continue with compliance of taking medication prescribed by Doctor Implement healthy coping skills discussed to assist with management of symptoms         SDOH assessments and interventions completed:  Yes  SDOH Interventions Today    Flowsheet Row Most Recent Value  SDOH Interventions   Housing Interventions Intervention Not Indicated  Transportation Interventions Intervention Not Indicated        Care Coordination Interventions:  Yes, provided  Interventions Today    Flowsheet Row Most Recent Value  Chronic Disease   Chronic disease during today's visit Diabetes, Hypertension (HTN)  General Interventions   General Interventions Discussed/Reviewed General Interventions Discussed, [facility], Doctor Visits, Labs  [Patient reports difficulty managing health conditions noting increase in numbness in left leg with ongoing pain. Strategies to assist with pain management discussed]  Labs --  [LCSW informed pt that her COVID screen tested negative at the beginning of the month]  Doctor Visits Discussed/Reviewed Doctor Visits Discussed, PCP  [LCSW transferred pt to PCP office to schedule ED f/up appt]  Mental Health Interventions   Mental Health Discussed/Reviewed Mental Health Discussed, Coping Strategies, Anxiety, Depression  Nutrition Interventions   Nutrition Discussed/Reviewed Nutrition Discussed  Pharmacy Interventions    Pharmacy Dicussed/Reviewed Pharmacy Topics Discussed, Medication Adherence  Safety Interventions   Safety Discussed/Reviewed Safety Discussed       Follow up plan: Follow up call scheduled for 2-4 weeks    Encounter Outcome:  Patient Visit Completed   Jenel Lucks, MSW, LCSW [facility] Care Management [facility]  [facility] [email or web address] Phone [phone] 6:38 PM

## 2023-07-14 ENCOUNTER — Other Ambulatory Visit: Payer: Self-pay | Admitting: Family Medicine

## 2023-07-14 DIAGNOSIS — E119 Type 2 diabetes mellitus without complications: Secondary | ICD-10-CM

## 2023-07-19 ENCOUNTER — Ambulatory Visit: Payer: Self-pay | Admitting: Licensed Clinical Social Worker

## 2023-07-20 NOTE — Patient Outreach (Signed)
Care Coordination   Follow Up Visit Note   07/19/2023 Name: Joann Meyers MRN: [id number] DOB: [birth year]  Joann Meyers is a 37 y.o. year old female who sees Georganna Skeans, MD for primary care. I spoke with  Joann Meyers by phone today.  What matters to the patients health and wellness today?  Symptom Management/Medication Refill    Goals Addressed             This Visit's Progress    Obtain Supportive Resources   On track    Activities and task to complete in order to accomplish goals.   Keep all upcoming appointments discussed today Continue with compliance of taking medication prescribed by Doctor Implement healthy coping skills discussed to assist with management of symptoms         SDOH assessments and interventions completed:  No     Care Coordination Interventions:  Yes, provided  Interventions Today    Flowsheet Row Most Recent Value  Chronic Disease   Chronic disease during today's visit Diabetes, Hypertension (HTN)  General Interventions   General Interventions Discussed/Reviewed General Interventions Reviewed, Doctor Visits, Communication with  [Numbness in leg went away. Pain on left shoulder and leg. The mornings have difficulty getting up, spouse assists, rub soap on hard to walk. Sharp and cramping pain at the same time. Chest pains come and go.]  Doctor Visits Discussed/Reviewed Doctor Visits Reviewed  [Pt has transportation to upcoming PCP appt on 10/14]  Communication with PCP/Specialists  [Pt is requesting an emergent refill for Metformin]  Mental Health Interventions   Mental Health Discussed/Reviewed Mental Health Reviewed, Coping Strategies  [Patient is practicing mindfulness to assist with pain management. Has strong support from family and friends]  Nutrition Interventions   Nutrition Discussed/Reviewed Nutrition Reviewed  Pharmacy Interventions   Pharmacy Dicussed/Reviewed Pharmacy Topics Discussed, Medication Adherence  [Patient has  not had metformin in approx 2 weeks, states that her sugars are extremely high due to this. Pharmacy is waiting for refill order from PCP]  Medication Adherence Unable to refill medication  Safety Interventions   Safety Discussed/Reviewed Safety Reviewed       Follow up plan: Follow up call scheduled for 2-4 weeks    Encounter Outcome:  Patient Visit Completed   Jenel Lucks, MSW, LCSW [facility] Care Management [facility]  [facility] [email or web address] Phone [phone] 8:23 AM

## 2023-07-20 NOTE — Patient Instructions (Signed)
Visit Information  Thank you for taking time to visit with me today. Please don't hesitate to contact me if I can be of assistance to you.   Following are the goals we discussed today:   Goals Addressed             This Visit's Progress    Obtain Supportive Resources   On track    Activities and task to complete in order to accomplish goals.   Keep all upcoming appointments discussed today Continue with compliance of taking medication prescribed by Doctor Implement healthy coping skills discussed to assist with management of symptoms         Our next appointment is by telephone on 10/17 at 3:30 PM  Please call the care guide team at [phone] if you need to cancel or reschedule your appointment.   If you are experiencing a Mental Health or Behavioral Health Crisis or need someone to talk to, please call the Suicide and Crisis Lifeline: 988 call 911   Patient verbalizes understanding of instructions and care plan provided today and agrees to view in MyChart. Active MyChart status and patient understanding of how to access instructions and care plan via MyChart confirmed with patient.     Jenel Lucks, MSW, LCSW [facility] Care Management [place]  [facility] [email or web address] Phone [phone] 8:24 AM

## 2023-07-25 ENCOUNTER — Encounter: Payer: Self-pay | Admitting: Lactation Services

## 2023-07-30 ENCOUNTER — Inpatient Hospital Stay: Payer: 59 | Admitting: Family

## 2023-08-01 ENCOUNTER — Telehealth: Payer: Self-pay

## 2023-08-01 NOTE — Telephone Encounter (Signed)
Transition Care Management Unsuccessful Follow-up Telephone Call  Date of discharge and from where:  [facility]  Attempts:  1st Attempt  Reason for unsuccessful TCM follow-up call:  No answer/busy   Lenard Forth [place]  [facility], [facility] Guide, Phone: [phone] Website: [email or web address]

## 2023-08-02 ENCOUNTER — Telehealth: Payer: Self-pay | Admitting: Licensed Clinical Social Worker

## 2023-08-02 ENCOUNTER — Telehealth: Payer: Self-pay

## 2023-08-02 ENCOUNTER — Encounter: Payer: Self-pay | Admitting: Licensed Clinical Social Worker

## 2023-08-02 NOTE — Patient Outreach (Signed)
Care Coordination   08/02/2023 Name: Joann Meyers MRN: [id number] DOB: 1986-09-20   Care Coordination Outreach Attempts:  An unsuccessful telephone outreach was attempted for a scheduled appointment today.  Follow Up Plan:  Additional outreach attempts will be made to offer the patient care coordination information and services.   Encounter Outcome:  No Answer   Care Coordination Interventions:  No, not indicated    Jenel Lucks, MSW, LCSW [facility] Care Management [place]  [facility] [email or web address] Phone [phone] 3:36 PM

## 2023-08-02 NOTE — Telephone Encounter (Signed)
Transition Care Management Unsuccessful Follow-up Telephone Call  Date of discharge and from where:  [facility]  Attempts:  2nd Attempt  Reason for unsuccessful TCM follow-up call:  No answer/busy   Lenard Forth [place]  [facility], [facility] Guide, Phone: [phone] Website: [email or web address]

## 2023-09-04 ENCOUNTER — Ambulatory Visit (INDEPENDENT_AMBULATORY_CARE_PROVIDER_SITE_OTHER): Payer: 59 | Admitting: Family Medicine

## 2023-09-04 ENCOUNTER — Encounter: Payer: Self-pay | Admitting: Family Medicine

## 2023-09-04 VITALS — BP 139/85 | HR 97 | Temp 98.2°F | Resp 16 | Wt 201.0 lb

## 2023-09-04 DIAGNOSIS — E785 Hyperlipidemia, unspecified: Secondary | ICD-10-CM

## 2023-09-04 DIAGNOSIS — E1165 Type 2 diabetes mellitus with hyperglycemia: Secondary | ICD-10-CM | POA: Diagnosis not present

## 2023-09-04 DIAGNOSIS — E119 Type 2 diabetes mellitus without complications: Secondary | ICD-10-CM | POA: Diagnosis not present

## 2023-09-04 DIAGNOSIS — Z794 Long term (current) use of insulin: Secondary | ICD-10-CM

## 2023-09-04 DIAGNOSIS — Z7984 Long term (current) use of oral hypoglycemic drugs: Secondary | ICD-10-CM | POA: Diagnosis not present

## 2023-09-04 DIAGNOSIS — F411 Generalized anxiety disorder: Secondary | ICD-10-CM

## 2023-09-04 LAB — POCT GLYCOSYLATED HEMOGLOBIN (HGB A1C): Hemoglobin A1C: 7.9 % — AB (ref 4.0–5.6)

## 2023-09-04 MED ORDER — SERTRALINE HCL 50 MG PO TABS: 50.0000 mg | ORAL_TABLET | Freq: Every day | ORAL | 2 refills | Status: DC
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
## 2023-09-04 NOTE — Progress Notes (Unsigned)
Patient is here for their 6 month follow-up Patient has no concerns today Care gaps have been discussed with patient  

## 2023-09-06 ENCOUNTER — Encounter: Payer: Self-pay | Admitting: Family Medicine

## 2023-09-22 ENCOUNTER — Encounter (HOSPITAL_COMMUNITY): Payer: Self-pay

## 2023-09-22 ENCOUNTER — Ambulatory Visit (HOSPITAL_COMMUNITY)
Admission: EM | Admit: 2023-09-22 | Discharge: 2023-09-22 | Disposition: A | Discharge status: Home / Self Care | Payer: Medicaid Other | Attending: Nurse Practitioner | Admitting: Nurse Practitioner

## 2023-09-22 DIAGNOSIS — L309 Dermatitis, unspecified: Secondary | ICD-10-CM

## 2023-09-22 MED ORDER — TRIAMCINOLONE ACETONIDE 0.1 % EX OINT
1.0000 | TOPICAL_OINTMENT | Freq: Two times a day (BID) | CUTANEOUS | 0 refills | Status: DC
Start: 2023-09-22 — End: 2023-10-24

## 2023-09-22 NOTE — Discharge Instructions (Addendum)
The rash on your hands is eczema.  Use the triamcinolone ointment twice daily and apply Vaseline overtop after each use.  Recommend applying Vaseline multiple times throughout the day to keep the skin on your hands protected.  Follow up with PCP if symptoms do not improve with this treatment.

## 2023-09-22 NOTE — ED Triage Notes (Signed)
Patient here today with c/o bilat hand pain X 3 days. No known injury. Patient states that she has been having some numbness and tingling in her fingers and describes the pain as burning and stabbing. She tried taking IBU with no relief.
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
## 2023-10-02 ENCOUNTER — Ambulatory Visit: Payer: 59 | Admitting: Family Medicine

## 2023-10-22 ENCOUNTER — Ambulatory Visit: Payer: Self-pay | Admitting: *Deleted

## 2023-10-22 ENCOUNTER — Encounter (HOSPITAL_COMMUNITY): Payer: Self-pay

## 2023-10-22 ENCOUNTER — Emergency Department (HOSPITAL_COMMUNITY)
Admission: EM | Admit: 2023-10-22 | Discharge: 2023-10-23 | Disposition: A | Discharge status: Home / Self Care | Payer: Medicaid Other | Attending: Emergency Medicine | Admitting: Emergency Medicine

## 2023-10-22 ENCOUNTER — Emergency Department (HOSPITAL_COMMUNITY): Payer: Medicaid Other

## 2023-10-22 ENCOUNTER — Other Ambulatory Visit: Payer: Self-pay

## 2023-10-22 DIAGNOSIS — Z79899 Other long term (current) drug therapy: Secondary | ICD-10-CM | POA: Diagnosis not present

## 2023-10-22 DIAGNOSIS — R079 Chest pain, unspecified: Secondary | ICD-10-CM | POA: Diagnosis present

## 2023-10-22 DIAGNOSIS — Z91040 Latex allergy status: Secondary | ICD-10-CM | POA: Insufficient documentation

## 2023-10-22 DIAGNOSIS — I159 Secondary hypertension, unspecified: Secondary | ICD-10-CM | POA: Diagnosis not present

## 2023-10-22 DIAGNOSIS — I10 Essential (primary) hypertension: Secondary | ICD-10-CM | POA: Diagnosis not present

## 2023-10-22 DIAGNOSIS — R739 Hyperglycemia, unspecified: Secondary | ICD-10-CM

## 2023-10-22 DIAGNOSIS — E1165 Type 2 diabetes mellitus with hyperglycemia: Secondary | ICD-10-CM | POA: Diagnosis not present

## 2023-10-22 DIAGNOSIS — Z794 Long term (current) use of insulin: Secondary | ICD-10-CM | POA: Diagnosis not present

## 2023-10-22 DIAGNOSIS — Z7982 Long term (current) use of aspirin: Secondary | ICD-10-CM | POA: Insufficient documentation

## 2023-10-22 LAB — CBC
HCT: 28.3 % — ABNORMAL LOW (ref 36.0–46.0)
Hemoglobin: 9.4 g/dL — ABNORMAL LOW (ref 12.0–15.0)
MCH: 27.1 pg (ref 26.0–34.0)
MCHC: 33.2 g/dL (ref 30.0–36.0)
MCV: 81.6 fL (ref 80.0–100.0)
Platelets: 169 10*3/uL (ref 150–400)
RBC: 3.47 MIL/uL — ABNORMAL LOW (ref 3.87–5.11)
RDW: 15.6 % — ABNORMAL HIGH (ref 11.5–15.5)
WBC: 5.2 10*3/uL (ref 4.0–10.5)
nRBC: 0 % (ref 0.0–0.2)

## 2023-10-22 LAB — BASIC METABOLIC PANEL
Anion gap: 10 (ref 5–15)
BUN: 20 mg/dL (ref 6–20)
CO2: 20 mmol/L — ABNORMAL LOW (ref 22–32)
Calcium: 9.1 mg/dL (ref 8.9–10.3)
Chloride: 102 mmol/L (ref 98–111)
Creatinine, Ser: 1.08 mg/dL — ABNORMAL HIGH (ref 0.44–1.00)
GFR, Estimated: >60 mL/min (ref >60)
Glucose, Bld: 611 mg/dL (ref 70–99)
Potassium: 4.6 mmol/L (ref 3.5–5.1)
Sodium: 132 mmol/L — ABNORMAL LOW (ref 135–145)

## 2023-10-22 LAB — HCG, SERUM, QUALITATIVE: Preg, Serum: NEGATIVE

## 2023-10-22 LAB — URINALYSIS, ROUTINE W REFLEX MICROSCOPIC
Bilirubin Urine: NEGATIVE
Glucose, UA: >=500 mg/dL — AB
Hgb urine dipstick: NEGATIVE
Ketones, ur: NEGATIVE mg/dL
Nitrite: NEGATIVE
Protein, ur: 100 mg/dL — AB
Specific Gravity, Urine: 1.02 (ref 1.005–1.030)
pH: 6 (ref 5.0–8.0)

## 2023-10-22 LAB — I-STAT VENOUS BLOOD GAS, ED
Acid-Base Excess: 1 mmol/L (ref 0.0–2.0)
Bicarbonate: 25.8 mmol/L (ref 20.0–28.0)
Calcium, Ion: 1.24 mmol/L (ref 1.15–1.40)
HCT: 28 % — ABNORMAL LOW (ref 36.0–46.0)
Hemoglobin: 9.5 g/dL — ABNORMAL LOW (ref 12.0–15.0)
O2 Saturation: 91 %
Potassium: 4.3 mmol/L (ref 3.5–5.1)
Sodium: 136 mmol/L (ref 135–145)
TCO2: 27 mmol/L (ref 22–32)
pCO2, Ven: 40.7 mm[Hg] — ABNORMAL LOW (ref 44–60)
pH, Ven: 7.41 (ref 7.25–7.43)
pO2, Ven: 59 mm[Hg] — ABNORMAL HIGH (ref 32–45)

## 2023-10-22 LAB — CBG MONITORING, ED: Glucose-Capillary: 289 mg/dL — ABNORMAL HIGH (ref 70–99)

## 2023-10-22 LAB — TROPONIN I (HIGH SENSITIVITY)
Troponin I (High Sensitivity): 8 ng/L (ref <18)
Troponin I (High Sensitivity): 8 ng/L (ref <18)

## 2023-10-22 MED ORDER — INSULIN ASPART 100 UNIT/ML IJ SOLN
10.0000 [IU] | INTRAMUSCULAR | Status: AC
  Administered 2023-10-22: 10 [IU] via SUBCUTANEOUS

## 2023-10-22 MED ORDER — NITROGLYCERIN 0.4 MG SL SUBL
0.4000 mg | SUBLINGUAL_TABLET | SUBLINGUAL | Status: DC | PRN
  Administered 2023-10-22: 0.4 mg via SUBLINGUAL
  Filled 2023-10-22: qty 1

## 2023-10-22 MED ORDER — GLIPIZIDE 5 MG PO TABS: 5.0000 mg | ORAL_TABLET | Freq: Two times a day (BID) | ORAL | 2 refills | Status: DC

## 2023-10-22 MED ORDER — SODIUM CHLORIDE 0.9 % IV BOLUS
1000.0000 mL | Freq: Once | INTRAVENOUS | Status: AC
  Administered 2023-10-22: 1000 mL via INTRAVENOUS

## 2023-10-22 MED ORDER — IOHEXOL 350 MG/ML SOLN
100.0000 mL | Freq: Once | INTRAVENOUS | Status: AC | PRN
  Administered 2023-10-22: 100 mL via INTRAVENOUS

## 2023-10-22 MED ORDER — LOSARTAN POTASSIUM 25 MG PO TABS: 25.0000 mg | ORAL_TABLET | Freq: Every day | ORAL | 3 refills | Status: DC

## 2023-10-22 MED ORDER — CLOPIDOGREL BISULFATE 75 MG PO TABS: 75.0000 mg | ORAL_TABLET | Freq: Every day | ORAL | 3 refills | Status: DC

## 2023-10-22 MED ORDER — ACETAMINOPHEN 500 MG PO TABS
1000.0000 mg | ORAL_TABLET | ORAL | Status: AC
  Administered 2023-10-22: 1000 mg via ORAL
  Filled 2023-10-22: qty 2

## 2023-10-22 MED ORDER — ATORVASTATIN CALCIUM 80 MG PO TABS: 80.0000 mg | ORAL_TABLET | Freq: Every day | ORAL | 3 refills | Status: DC

## 2023-10-22 MED ORDER — ASPIRIN 81 MG PO TBEC: 81.0000 mg | DELAYED_RELEASE_TABLET | Freq: Every day | ORAL | 12 refills | Status: DC

## 2023-10-22 MED ORDER — HYDROMORPHONE HCL 1 MG/ML IJ SOLN
1.0000 mg | Freq: Once | INTRAMUSCULAR | Status: AC
  Administered 2023-10-22: 1 mg via INTRAVENOUS
  Filled 2023-10-22: qty 1

## 2023-10-22 MED ORDER — METFORMIN HCL 1000 MG PO TABS: 1000.0000 mg | ORAL_TABLET | Freq: Two times a day (BID) | ORAL | 2 refills | Status: DC

## 2023-10-22 MED ORDER — METOPROLOL SUCCINATE ER 25 MG PO TB24: 25.0000 mg | ORAL_TABLET | Freq: Every day | ORAL | 3 refills | Status: DC

## 2023-10-22 MED ORDER — LABETALOL HCL 5 MG/ML IV SOLN
10.0000 mg | Freq: Once | INTRAVENOUS | Status: AC
  Administered 2023-10-22: 10 mg via INTRAVENOUS
  Filled 2023-10-22: qty 4

## 2023-10-22 MED ORDER — LIDOCAINE 5 % EX PTCH
1.0000 | MEDICATED_PATCH | CUTANEOUS | Status: DC
  Administered 2023-10-22: 1 via TRANSDERMAL
  Filled 2023-10-22: qty 1

## 2023-10-22 NOTE — Telephone Encounter (Signed)
  Chief Complaint: Pain all over her chest, shortness of breath, elevated heart rate, and BP 222/90.    Nurse at her place of employment there with her and got on the phone with me after I talked with pt with pt's permission.   Pt had told the nurse she was also having shortness of breath and had diabetes and had had 4 stents put in her heart.   Symptoms: See above Frequency: Now Pertinent Negatives: Patient denies having shortness of breath but told the nurse before I spoke with pt that she was short of breath. Disposition: [x] ED /[] Urgent Care (no appt availability in office) / [] Appointment(In office/virtual)/ []  [facility]/ [] Home Care/ [] Refused Recommended Disposition /[] [facility]/ []  Follow-up with PCP Additional Notes: She did not want to go to the ED.   I required me and the nurse at her job to call 911 and go to the ED.   She was finally agreeable.

## 2023-10-22 NOTE — ED Provider Triage Note (Signed)
 Emergency Medicine Provider Triage Evaluation Note  Joann Meyers , a 38 y.o. female  was evaluated in triage.  Pt complains of chest pain. Started when she was walking at work and felt like an elephant sitting on my chest. Had BP checked by nurse at work and had a BP of 200 systolic.   Previous medical Hx NSTEMI, T2DM Only taking metformin .    Review of Systems  Positive: Polyuria, polydipsia, blurry vision.  Negative: Fever, shortness of breath, abdominal pain, n/v/d,   Physical Exam  BP (!) 201/92 (BP Location: Left Arm)   Pulse 85   Temp 98.4 F (36.9 C)   Resp 17   Ht 5' 2 (1.575 m)   Wt 91.6 kg   LMP 09/05/2023 (Exact Date)   SpO2 98%   BMI 36.94 kg/m  Gen:   Awake, no distress   Resp:  Normal effort  MSK:   Moves extremities without difficulty  Other:    Medical Decision Making  Medically screening exam initiated at 7:21 PM.  Appropriate orders placed.  Joann Meyers was informed that the remainder of the evaluation will be completed by another provider, this initial triage assessment does not replace that evaluation, and the importance of remaining in the ED until their evaluation is complete.     Beola Terrall RAMAN, [place] 10/22/23 1924

## 2023-10-22 NOTE — ED Notes (Signed)
 After giving patient dilaudid , patient started to hyperventilate and become unable to answer questions. Patient's heart rate increased to 128. This nurse stayed with patient and Yolande Lamar BROCKS, MD was contacted and came into the patient's room. Yolande stated to go ahead and give patient the labetalol . Patient's vital signs are stable. Patient able to speak and is A&Ox4.

## 2023-10-22 NOTE — ED Triage Notes (Signed)
 Pt c/o chest pain in entire chest, SOB, HTN started today at 1430. Pt states got her bp checked at work and it was 222/90. Pt c/o shaky, blurred vision.
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
## 2023-10-22 NOTE — ED Notes (Signed)
Patient was able to ambulate to the bathroom independently.

## 2023-10-22 NOTE — Discharge Instructions (Signed)
 You were seen for your chest pain, elevated blood pressure, and diabetes in the emergency department.   At home, please start taking your home medications including her metformin , Cozaar , and metoprolol .    Check your MyChart online for the results of any tests that had not resulted by the time you left the emergency department.   Follow-up with your primary doctor in 2-3 days regarding your visit.  Cardiology will call you within 72 hours about follow-up.  If you do not hear from them please call their office using the information on the sheet.  Return immediately to the emergency department if you experience any of the following: Worsening chest pain, shortness of breath, or any other concerning symptoms.    Thank you for visiting our Emergency Department. It was a pleasure taking care of you today.

## 2023-10-22 NOTE — Telephone Encounter (Signed)
 Reason for Disposition  [1] Chest pain lasts > 5 minutes AND [2] age > 30 AND [3] one or more cardiac risk factors (e.g., diabetes, high blood pressure, high cholesterol, smoker, or strong family history of heart disease)    4 stents in her heart  Answer Assessment - Initial Assessment Questions 1. LOCATION: Where does it hurt?       I'm having chest pain all over my chest.   I'm at work now.   On the way to the break room the chest pain started.   My heart rate is high.    No dizziness or shortness of breath.    I've had this happen before.   I had this when I had the 4 stents put in my heart.  The nurse came on the phone there at her job got on the line.    BP 222/90, pulse 104.   She has a history of cardiac problems and diabetes.   She has a blockage in both sides of her neck.   I gave her 4 baby aspirin  and told her she needs to go to the ED now but she is not wanting to go.   So I told her she needed to call her dr while I was with her for their advice.   That's why I had her call you.   Before the nurse got on the line I told the pt she needed to go to the ED now.   After the nurse got on the line the nurse was in the background trying to get the pt to agree to go on to the ED.    We just want you to get the proper care.    I talked with pt and she was finally agreeable to going on to the ED.   I advised she call 911 and go now.   The nurse also agreed.   Pt. Agreeable so call ended at this point.     2. RADIATION: Does the pain go anywhere else? (e.g., into neck, jaw, arms, back)     Hurts all over my chest    Pt denied feeling short of breath but she told the nurse a few min. Earlier she was feeling short of breath and felt like her heart was racing.   BP is elevated too.    3. ONSET: When did the chest pain begin? (Minutes, hours or days)      A few min. Ago on the way to the break room. 4. PATTERN: Does the pain come and go, or has it been constant since it started?  Does it get  worse with exertion?      Constant now all over my chest 5. DURATION: How long does it last (e.g., seconds, minutes, hours)     constant 6. SEVERITY: How bad is the pain?  (e.g., Scale 1-10; mild, moderate, or severe)    - MILD (1-3): doesn't interfere with normal activities     - MODERATE (4-7): interferes with normal activities or awakens from sleep    - SEVERE (8-10): excruciating pain, unable to do any normal activities       Moderate 7. CARDIAC RISK FACTORS: Do you have any history of heart problems or risk factors for heart disease? (e.g., angina, prior heart attack; diabetes, high blood pressure, high cholesterol, smoker, or strong family history of heart disease)     Yes   Has had 4 stents put in her heart, has blockages in  both sides of her neck and diabetes.    8. PULMONARY RISK FACTORS: Do you have any history of lung disease?  (e.g., blood clots in lung, asthma, emphysema, birth control pills)     Not asked 9. CAUSE: What do you think is causing the chest pain?     Not asked 10. OTHER SYMPTOMS: Do you have any other symptoms? (e.g., dizziness, nausea, vomiting, sweating, fever, difficulty breathing, cough)       Denied feeling dizzy or lightheaded 11. PREGNANCY: Is there any chance you are pregnant? When was your last menstrual period?       Not asked  Protocols used: Chest Pain-A-AH

## 2023-10-23 LAB — TROPONIN I (HIGH SENSITIVITY): Troponin I (High Sensitivity): 9 ng/L (ref <18)

## 2023-10-24 ENCOUNTER — Telehealth: Payer: Self-pay

## 2023-10-24 ENCOUNTER — Ambulatory Visit (HOSPITAL_COMMUNITY)
Admission: EM | Admit: 2023-10-24 | Discharge: 2023-10-24 | Disposition: A | Discharge status: Home / Self Care | Payer: Medicaid Other | Attending: Emergency Medicine | Admitting: Emergency Medicine

## 2023-10-24 ENCOUNTER — Encounter (HOSPITAL_COMMUNITY): Payer: Self-pay

## 2023-10-24 DIAGNOSIS — E1165 Type 2 diabetes mellitus with hyperglycemia: Secondary | ICD-10-CM

## 2023-10-24 DIAGNOSIS — M940 Chondrocostal junction syndrome [Tietze]: Secondary | ICD-10-CM

## 2023-10-24 DIAGNOSIS — L209 Atopic dermatitis, unspecified: Secondary | ICD-10-CM

## 2023-10-24 DIAGNOSIS — I10 Essential (primary) hypertension: Secondary | ICD-10-CM

## 2023-10-24 LAB — POCT FASTING CBG KUC MANUAL ENTRY: POCT Glucose (KUC): 345 mg/dL — AB (ref 70–99)

## 2023-10-24 MED ORDER — KETOROLAC TROMETHAMINE 30 MG/ML IJ SOLN
INTRAMUSCULAR | Status: AC
  Filled 2023-10-24: qty 1

## 2023-10-24 MED ORDER — TRIAMCINOLONE ACETONIDE 0.1 % EX CREA: 1.0000 | TOPICAL_CREAM | Freq: Two times a day (BID) | CUTANEOUS | 3 refills | Status: DC

## 2023-10-24 MED ORDER — LANCET DEVICE MISC: 1.0000 | Freq: Three times a day (TID) | 0 refills | Status: AC

## 2023-10-24 MED ORDER — BLOOD GLUCOSE TEST VI STRP: 1.0000 | ORAL_STRIP | Freq: Three times a day (TID) | 0 refills | Status: AC

## 2023-10-24 MED ORDER — LANCETS MISC. MISC: 1.0000 | Freq: Three times a day (TID) | 0 refills | Status: AC

## 2023-10-24 MED ORDER — BLOOD GLUCOSE MONITORING SUPPL DEVI: 1.0000 | Freq: Three times a day (TID) | 0 refills | Status: DC

## 2023-10-24 MED ORDER — BACLOFEN 10 MG PO TABS: 10.0000 mg | ORAL_TABLET | Freq: Two times a day (BID) | ORAL | 0 refills | Status: DC | PRN

## 2023-10-24 MED ORDER — KETOROLAC TROMETHAMINE 30 MG/ML IJ SOLN
30.0000 mg | Freq: Once | INTRAMUSCULAR | Status: AC
  Administered 2023-10-24: 30 mg via INTRAMUSCULAR

## 2023-10-24 MED ORDER — TRAMADOL HCL 50 MG PO TABS: 50.0000 mg | ORAL_TABLET | Freq: Two times a day (BID) | ORAL | 0 refills | Status: AC | PRN

## 2023-10-24 NOTE — Transitions of Care (Post Inpatient/ED Visit) (Signed)
   10/24/2023  Name: Joann Meyers MRN: [id number] DOB: 22-Feb-1986  Today's TOC FU Call Status: Today's TOC FU Call Status:: Unsuccessful Call (1st Attempt) Unsuccessful Call (1st Attempt) Date: 10/24/23  Attempted to reach the patient regarding the most recent Inpatient/ED visit.  Follow Up Plan: Additional outreach attempts will be made to reach the patient to complete the Transitions of Care (Post Inpatient/ED visit) call.   Signature Julian Lemmings, LPN [facility] Nurse Health Advisor Direct Dial [phone]

## 2023-10-24 NOTE — ED Triage Notes (Signed)
 Patient here today with c/o chest pain that started this afternoon. Patient was in the hospital yesterday for chest pain, hypertension with hyperglycemia. Patient states that her treatment at the hospital helped for 1 day.   Patient also c/o itchy rash on both hand that started 2 weeks ago. She has been using Vaseline with no relief.

## 2023-10-24 NOTE — Discharge Instructions (Addendum)
 Tramadol  and baclofen  for chest wall pain.  Consider taking Tylenol  1000 mg 3 times a day.  Use ibuprofen  with caution as this may raise your blood pressure even further. Consider topical ointments such as Bengay or IcyHot.  You have been given triamacilone cream for your hands - please use this daily.  Use vasaline or A&D ointment  three times daily   You have been ordered a blood glucose monitoring kit please check blood sugars at least daily if not twice a day.  Your blood sugars were 345 in office today.  We have started a new medication glipizide  please take this in the mornings before breakfast. Keep your routine appointment with your PCP as you are now on new medication and need to be monitored closely.

## 2023-10-26 NOTE — Transitions of Care (Post Inpatient/ED Visit) (Signed)
   10/26/2023  Name: Joann Meyers MRN: [id number] DOB: April 10, 1986  Today's TOC FU Call Status: Today's TOC FU Call Status:: Unsuccessful Call (2nd Attempt) Unsuccessful Call (1st Attempt) Date: 10/24/23 Unsuccessful Call (2nd Attempt) Date: 10/26/23  Attempted to reach the patient regarding the most recent Inpatient/ED visit.  Follow Up Plan: Additional outreach attempts will be made to reach the patient to complete the Transitions of Care (Post Inpatient/ED visit) call.   Signature Julian Lemmings, LPN [facility] Nurse Health Advisor Direct Dial [phone]

## 2023-10-26 NOTE — Transitions of Care (Post Inpatient/ED Visit) (Signed)
   10/26/2023  Name: Joann Meyers MRN: [id number] DOB: January 14, 1986  Today's TOC FU Call Status: Today's TOC FU Call Status:: Unsuccessful Call (3rd Attempt) Unsuccessful Call (1st Attempt) Date: 10/24/23 Unsuccessful Call (2nd Attempt) Date: 10/26/23 Unsuccessful Call (3rd Attempt) Date: 10/26/23  Attempted to reach the patient regarding the most recent Inpatient/ED visit.  Follow Up Plan: No further outreach attempts will be made at this time. We have been unable to contact the patient.  Signature Julian Lemmings, LPN [facility] Nurse Health Advisor Direct Dial [phone]

## 2023-10-29 ENCOUNTER — Ambulatory Visit (INDEPENDENT_AMBULATORY_CARE_PROVIDER_SITE_OTHER): Payer: Medicaid Other | Admitting: Family Medicine

## 2023-10-29 DIAGNOSIS — F411 Generalized anxiety disorder: Secondary | ICD-10-CM | POA: Diagnosis not present

## 2023-10-29 DIAGNOSIS — E785 Hyperlipidemia, unspecified: Secondary | ICD-10-CM

## 2023-10-29 DIAGNOSIS — Z7984 Long term (current) use of oral hypoglycemic drugs: Secondary | ICD-10-CM

## 2023-10-29 DIAGNOSIS — I10 Essential (primary) hypertension: Secondary | ICD-10-CM | POA: Diagnosis not present

## 2023-10-29 DIAGNOSIS — Z794 Long term (current) use of insulin: Secondary | ICD-10-CM

## 2023-10-29 DIAGNOSIS — E1165 Type 2 diabetes mellitus with hyperglycemia: Secondary | ICD-10-CM | POA: Diagnosis not present

## 2023-10-29 DIAGNOSIS — E119 Type 2 diabetes mellitus without complications: Secondary | ICD-10-CM

## 2023-10-29 MED ORDER — LOSARTAN POTASSIUM-HCTZ 100-25 MG PO TABS: 1.0000 | ORAL_TABLET | Freq: Every day | ORAL | 0 refills | Status: DC

## 2023-10-29 MED ORDER — METFORMIN HCL 1000 MG PO TABS: 1000.0000 mg | ORAL_TABLET | Freq: Two times a day (BID) | ORAL | 0 refills | Status: DC

## 2023-10-29 MED ORDER — ATORVASTATIN CALCIUM 80 MG PO TABS: 80.0000 mg | ORAL_TABLET | Freq: Every day | ORAL | 3 refills | Status: DC

## 2023-10-29 MED ORDER — SERTRALINE HCL 50 MG PO TABS: 50.0000 mg | ORAL_TABLET | Freq: Every day | ORAL | 0 refills | Status: AC

## 2023-11-01 ENCOUNTER — Encounter: Payer: Self-pay | Admitting: Family Medicine
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
## 2023-11-19 ENCOUNTER — Ambulatory Visit: Payer: Self-pay | Admitting: Family Medicine

## 2023-11-19 NOTE — Telephone Encounter (Addendum)
Copied from CRM [id number]. Topic: Clinical - Red Word Triage >> Nov 19, 2023  3:35 PM Whitney O wrote: [facility] that prompted transfer to Nurse Triage: sugar high and having pain on left side    Chief Complaint: Elevated blood sugar Symptoms: Chest pain, lightheaded Disposition: [x] ED /[] Urgent Care (no appt availability in office) / [] Appointment(In office/virtual)/ []  [facility]/ [] Home Care/ [] Refused Recommended Disposition /[] [facility] [facility]/ []  Follow-up with PCP Additional Notes: Patient stated that she is currently at work. She is experiencing severe chest pain that started around 2:50 pm today and it hasn't went away. Patient also reported lightheaded feeling and her blood sugar is elevated. She stated her blood sugar was 105 this morning. It was checked again while at work, and the glucometer result said "high". There was no value listed, and the nurse who checked her blood sugar said it can be anywhere from 400-500 or higher. This RN advised patient to be seen at nearest hospital immediately. I offered to call EMS, patient declined. She stated that someone at the job will call EMS for her and she is agreeable to go to the hospital.   Reason for Disposition  [1] Chest pain lasts > 5 minutes AND [2] age > 30 AND [3] one or more cardiac risk factors (e.g., diabetes, high blood pressure, high cholesterol, smoker, or strong family history of heart disease)  Answer Assessment - Initial Assessment Questions 1. BLOOD GLUCOSE: "What is your blood glucose level?"      Unknown, The reading just said "high"  2. ONSET: "When did you check the blood glucose?"     Today  3. INSULIN: "Do you take insulin?" "What type of insulin(s) do you use? What is the mode of delivery? (syringe, pen; injection or pump)?"      Patient stated she takes Metformin  4. OTHER SYMPTOMS: "Do you have any symptoms?" (e.g., fever, frequent urination, difficulty breathing, dizziness, weakness,  vomiting)     Lightheaded(can still walk and stand)  Answer Assessment - Initial Assessment Questions 1. LOCATION: "Where does it hurt?"       This afternoon 2:50  2. RADIATION: "Does the pain go anywhere else?" (e.g., into neck, jaw, arms, back)     Goes to shoulder   3. ONSET: "When did the chest pain begin?" (Minutes, hours or days)      This afternoon at 2:50 pm  4. PATTERN: "Does the pain come and go, or has it been constant since it started?"  "Does it get worse with exertion?"      Constant since it started   5. SEVERITY: "How bad is the pain?"  (e.g., Scale 1-10; mild, moderate, or severe)    - MILD (1-3): doesn't interfere with normal activities     - MODERATE (4-7): interferes with normal activities or awakens from sleep    - SEVERE (8-10): excruciating pain, unable to do any normal activities       10/10  6. CARDIAC RISK FACTORS: "Do you have any history of heart problems or risk factors for heart disease?" (e.g., angina, prior heart attack; diabetes, high blood pressure, high cholesterol, smoker, or strong family history of heart disease)     Patient stated she had a "light heart attack" before  7 OTHER SYMPTOMS: "Do you have any other symptoms?" (e.g., dizziness, nausea, vomiting, sweating, fever, difficulty breathing, cough)       lightheadedness  Protocols used: Diabetes - High Blood Sugar-A-AH, Chest Pain-A-AH

## 2023-11-19 NOTE — Telephone Encounter (Signed)
 Noted

## 2023-12-03 ENCOUNTER — Telehealth: Payer: Self-pay

## 2023-12-03 NOTE — Transitions of Care (Post Inpatient/ED Visit) (Unsigned)
   12/03/2023  Name: Joann Meyers MRN: [id number] DOB: 03/13/1986  Today's TOC FU Call Status: Today's TOC FU Call Status:: Unsuccessful Call (1st Attempt) Unsuccessful Call (1st Attempt) Date: 12/03/23  Attempted to reach the patient regarding the most recent Inpatient/ED visit.  Follow Up Plan: Additional outreach attempts will be made to reach the patient to complete the Transitions of Care (Post Inpatient/ED visit) call.   Signature Karena Addison, LPN [facility] Nurse Health Advisor Direct Dial [phone]

## 2023-12-04 NOTE — Transitions of Care (Post Inpatient/ED Visit) (Unsigned)
   12/04/2023  Name: Joann Meyers MRN: [id number] DOB: [birth year]  Today's TOC FU Call Status: Today's TOC FU Call Status:: Unsuccessful Call (2nd Attempt) Unsuccessful Call (1st Attempt) Date: 12/03/23 Unsuccessful Call (2nd Attempt) Date: 12/04/23  Attempted to reach the patient regarding the most recent Inpatient/ED visit.  Follow Up Plan: Additional outreach attempts will be made to reach the patient to complete the Transitions of Care (Post Inpatient/ED visit) call.   Signature Karena Addison, LPN [facility] Nurse Health Advisor Direct Dial [phone]

## 2023-12-05 NOTE — Transitions of Care (Post Inpatient/ED Visit) (Signed)
   12/05/2023  Name: Joann Meyers MRN: [id number] DOB: 1986-03-27  Today's TOC FU Call Status: Today's TOC FU Call Status:: Unsuccessful Call (3rd Attempt) Unsuccessful Call (1st Attempt) Date: 12/03/23 Unsuccessful Call (2nd Attempt) Date: 12/04/23 Unsuccessful Call (3rd Attempt) Date: 12/05/23  Attempted to reach the patient regarding the most recent Inpatient/ED visit.  Follow Up Plan: No further outreach attempts will be made at this time. We have been unable to contact the patient.  Signature Karena Addison, LPN [facility] Nurse Health Advisor Direct Dial [phone]

## 2023-12-06 ENCOUNTER — Inpatient Hospital Stay (HOSPITAL_COMMUNITY)
Admission: EM | Admit: 2023-12-06 | Discharge: 2023-12-08 | DRG: 817 | Disposition: A | Discharge status: Home / Self Care | Payer: Medicaid Other | Attending: Student | Admitting: Student

## 2023-12-06 ENCOUNTER — Other Ambulatory Visit: Payer: Self-pay

## 2023-12-06 ENCOUNTER — Emergency Department (HOSPITAL_COMMUNITY): Payer: Medicaid Other

## 2023-12-06 ENCOUNTER — Encounter (HOSPITAL_COMMUNITY): Payer: Self-pay | Admitting: Emergency Medicine

## 2023-12-06 DIAGNOSIS — O99011 Anemia complicating pregnancy, first trimester: Secondary | ICD-10-CM | POA: Diagnosis present

## 2023-12-06 DIAGNOSIS — Z885 Allergy status to narcotic agent status: Secondary | ICD-10-CM | POA: Diagnosis not present

## 2023-12-06 DIAGNOSIS — I214 Non-ST elevation (NSTEMI) myocardial infarction: Principal | ICD-10-CM | POA: Diagnosis present

## 2023-12-06 DIAGNOSIS — Z832 Family history of diseases of the blood and blood-forming organs and certain disorders involving the immune mechanism: Secondary | ICD-10-CM

## 2023-12-06 DIAGNOSIS — Z3491 Encounter for supervision of normal pregnancy, unspecified, first trimester: Secondary | ICD-10-CM | POA: Diagnosis not present

## 2023-12-06 DIAGNOSIS — Z8673 Personal history of transient ischemic attack (TIA), and cerebral infarction without residual deficits: Secondary | ICD-10-CM | POA: Diagnosis not present

## 2023-12-06 DIAGNOSIS — I160 Hypertensive urgency: Secondary | ICD-10-CM | POA: Diagnosis present

## 2023-12-06 DIAGNOSIS — E119 Type 2 diabetes mellitus without complications: Secondary | ICD-10-CM | POA: Diagnosis not present

## 2023-12-06 DIAGNOSIS — E781 Pure hyperglyceridemia: Secondary | ICD-10-CM | POA: Diagnosis present

## 2023-12-06 DIAGNOSIS — Z7982 Long term (current) use of aspirin: Secondary | ICD-10-CM

## 2023-12-06 DIAGNOSIS — I2510 Atherosclerotic heart disease of native coronary artery without angina pectoris: Secondary | ICD-10-CM | POA: Diagnosis present

## 2023-12-06 DIAGNOSIS — O161 Unspecified maternal hypertension, first trimester: Secondary | ICD-10-CM | POA: Diagnosis present

## 2023-12-06 DIAGNOSIS — O99211 Obesity complicating pregnancy, first trimester: Secondary | ICD-10-CM | POA: Diagnosis present

## 2023-12-06 DIAGNOSIS — Z91013 Allergy to seafood: Secondary | ICD-10-CM

## 2023-12-06 DIAGNOSIS — Z79899 Other long term (current) drug therapy: Secondary | ICD-10-CM

## 2023-12-06 DIAGNOSIS — O24911 Unspecified diabetes mellitus in pregnancy, first trimester: Secondary | ICD-10-CM | POA: Diagnosis present

## 2023-12-06 DIAGNOSIS — E66812 Obesity, class 2: Secondary | ICD-10-CM | POA: Diagnosis present

## 2023-12-06 DIAGNOSIS — I10 Essential (primary) hypertension: Secondary | ICD-10-CM | POA: Diagnosis present

## 2023-12-06 DIAGNOSIS — T82855A Stenosis of coronary artery stent, initial encounter: Secondary | ICD-10-CM | POA: Diagnosis present

## 2023-12-06 DIAGNOSIS — Z794 Long term (current) use of insulin: Secondary | ICD-10-CM

## 2023-12-06 DIAGNOSIS — Z825 Family history of asthma and other chronic lower respiratory diseases: Secondary | ICD-10-CM

## 2023-12-06 DIAGNOSIS — E7800 Pure hypercholesterolemia, unspecified: Secondary | ICD-10-CM | POA: Diagnosis present

## 2023-12-06 DIAGNOSIS — I252 Old myocardial infarction: Secondary | ICD-10-CM | POA: Diagnosis not present

## 2023-12-06 DIAGNOSIS — Z833 Family history of diabetes mellitus: Secondary | ICD-10-CM

## 2023-12-06 DIAGNOSIS — Z888 Allergy status to other drugs, medicaments and biological substances status: Secondary | ICD-10-CM

## 2023-12-06 DIAGNOSIS — O99419 Diseases of the circulatory system complicating pregnancy, unspecified trimester: Secondary | ICD-10-CM | POA: Diagnosis present

## 2023-12-06 DIAGNOSIS — E1165 Type 2 diabetes mellitus with hyperglycemia: Secondary | ICD-10-CM | POA: Diagnosis present

## 2023-12-06 DIAGNOSIS — Z7984 Long term (current) use of oral hypoglycemic drugs: Secondary | ICD-10-CM

## 2023-12-06 DIAGNOSIS — R8271 Bacteriuria: Secondary | ICD-10-CM | POA: Diagnosis present

## 2023-12-06 DIAGNOSIS — O99281 Endocrine, nutritional and metabolic diseases complicating pregnancy, first trimester: Secondary | ICD-10-CM | POA: Diagnosis present

## 2023-12-06 DIAGNOSIS — Z3A01 Less than 8 weeks gestation of pregnancy: Secondary | ICD-10-CM | POA: Diagnosis not present

## 2023-12-06 DIAGNOSIS — Z3490 Encounter for supervision of normal pregnancy, unspecified, unspecified trimester: Secondary | ICD-10-CM

## 2023-12-06 DIAGNOSIS — Z955 Presence of coronary angioplasty implant and graft: Secondary | ICD-10-CM

## 2023-12-06 DIAGNOSIS — Y831 Surgical operation with implant of artificial internal device as the cause of abnormal reaction of the patient, or of later complication, without mention of misadventure at the time of the procedure: Secondary | ICD-10-CM | POA: Diagnosis present

## 2023-12-06 DIAGNOSIS — Z91040 Latex allergy status: Secondary | ICD-10-CM

## 2023-12-06 DIAGNOSIS — Z5986 Financial insecurity: Secondary | ICD-10-CM

## 2023-12-06 DIAGNOSIS — Z91014 Allergy to mammalian meats: Secondary | ICD-10-CM

## 2023-12-06 DIAGNOSIS — Z7902 Long term (current) use of antithrombotics/antiplatelets: Secondary | ICD-10-CM

## 2023-12-06 DIAGNOSIS — D509 Iron deficiency anemia, unspecified: Secondary | ICD-10-CM | POA: Diagnosis present

## 2023-12-06 DIAGNOSIS — Z91018 Allergy to other foods: Secondary | ICD-10-CM

## 2023-12-06 LAB — HEPATIC FUNCTION PANEL
ALT: 15 U/L (ref 0–44)
AST: 22 U/L (ref 15–41)
Albumin: 3.6 g/dL (ref 3.5–5.0)
Alkaline Phosphatase: 59 U/L (ref 38–126)
Bilirubin, Direct: 0.2 mg/dL (ref 0.0–0.2)
Indirect Bilirubin: 0.6 mg/dL (ref 0.3–0.9)
Total Bilirubin: 0.8 mg/dL (ref 0.0–1.2)
Total Protein: 6.9 g/dL (ref 6.5–8.1)

## 2023-12-06 LAB — HCG, QUANTITATIVE, PREGNANCY: hCG, Beta Chain, Quant, S: 2746 m[IU]/mL — ABNORMAL HIGH (ref <5)

## 2023-12-06 LAB — CBC
HCT: 30.5 % — ABNORMAL LOW (ref 36.0–46.0)
Hemoglobin: 10.3 g/dL — ABNORMAL LOW (ref 12.0–15.0)
MCH: 27 pg (ref 26.0–34.0)
MCHC: 33.8 g/dL (ref 30.0–36.0)
MCV: 79.8 fL — ABNORMAL LOW (ref 80.0–100.0)
Platelets: 213 K/uL (ref 150–400)
RBC: 3.82 MIL/uL — ABNORMAL LOW (ref 3.87–5.11)
RDW: 15.5 % (ref 11.5–15.5)
WBC: 6.7 K/uL (ref 4.0–10.5)
nRBC: 0 % (ref 0.0–0.2)

## 2023-12-06 LAB — BASIC METABOLIC PANEL
Anion gap: 13 (ref 5–15)
BUN: 16 mg/dL (ref 6–20)
CO2: 20 mmol/L — ABNORMAL LOW (ref 22–32)
Calcium: 9.3 mg/dL (ref 8.9–10.3)
Chloride: 98 mmol/L (ref 98–111)
Creatinine, Ser: 0.97 mg/dL (ref 0.44–1.00)
GFR, Estimated: >60 mL/min (ref >60)
Glucose, Bld: 432 mg/dL — ABNORMAL HIGH (ref 70–99)
Potassium: 4.1 mmol/L (ref 3.5–5.1)
Sodium: 131 mmol/L — ABNORMAL LOW (ref 135–145)

## 2023-12-06 LAB — LACTIC ACID, PLASMA: Lactic Acid, Venous: 1.5 mmol/L (ref 0.5–1.9)

## 2023-12-06 LAB — TROPONIN I (HIGH SENSITIVITY): Troponin I (High Sensitivity): 806 ng/L

## 2023-12-06 LAB — HCG, SERUM, QUALITATIVE: Preg, Serum: POSITIVE — AB

## 2023-12-06 MED ORDER — ONDANSETRON HCL 4 MG/2ML IJ SOLN: 4.0000 mg | Freq: Four times a day (QID) | INTRAMUSCULAR | Status: DC | PRN

## 2023-12-06 MED ORDER — OXYCODONE HCL 5 MG PO TABS: 5.0000 mg | ORAL_TABLET | ORAL | Status: DC | PRN

## 2023-12-06 MED ORDER — ACETAMINOPHEN 325 MG PO TABS
650.0000 mg | ORAL_TABLET | ORAL | Status: DC | PRN
  Administered 2023-12-07: 650 mg via ORAL
  Filled 2023-12-06: qty 2

## 2023-12-06 MED ORDER — INSULIN ASPART 100 UNIT/ML IJ SOLN
0.0000 [IU] | INTRAMUSCULAR | Status: DC
  Administered 2023-12-07 (×2): 5 [IU] via SUBCUTANEOUS
  Administered 2023-12-07: 3 [IU] via SUBCUTANEOUS
  Administered 2023-12-07: 5 [IU] via SUBCUTANEOUS
  Administered 2023-12-07: 9 [IU] via SUBCUTANEOUS
  Administered 2023-12-08 (×2): 2 [IU] via SUBCUTANEOUS

## 2023-12-06 MED ORDER — COMPLETENATE 29-1 MG PO CHEW
1.0000 | CHEWABLE_TABLET | Freq: Every day | ORAL | Status: DC
  Filled 2023-12-06 (×2): qty 1

## 2023-12-06 MED ORDER — CLOPIDOGREL BISULFATE 75 MG PO TABS
75.0000 mg | ORAL_TABLET | Freq: Every day | ORAL | Status: DC
  Administered 2023-12-07 – 2023-12-08 (×2): 75 mg via ORAL
  Filled 2023-12-06 (×2): qty 1

## 2023-12-06 MED ORDER — METOPROLOL TARTRATE 12.5 MG HALF TABLET
12.5000 mg | ORAL_TABLET | Freq: Two times a day (BID) | ORAL | Status: DC
  Administered 2023-12-07 – 2023-12-08 (×3): 12.5 mg via ORAL
  Filled 2023-12-06 (×3): qty 1

## 2023-12-06 MED ORDER — POLYETHYLENE GLYCOL 3350 17 G PO PACK
17.0000 g | PACK | Freq: Every day | ORAL | Status: DC | PRN
Start: 2023-12-06 — End: 2023-12-08

## 2023-12-06 MED ORDER — NITROGLYCERIN IN D5W 200-5 MCG/ML-% IV SOLN
0.0000 ug/min | INTRAVENOUS | Status: DC
  Administered 2023-12-06: 5 ug/min via INTRAVENOUS
  Filled 2023-12-06: qty 250

## 2023-12-06 MED ORDER — ASPIRIN 81 MG PO CHEW
324.0000 mg | CHEWABLE_TABLET | Freq: Once | ORAL | Status: AC
  Administered 2023-12-06: 324 mg via ORAL
  Filled 2023-12-06: qty 4

## 2023-12-06 MED ORDER — HEPARIN BOLUS VIA INFUSION
4000.0000 [IU] | Freq: Once | INTRAVENOUS | Status: AC
  Administered 2023-12-06: 4000 [IU] via INTRAVENOUS
  Filled 2023-12-06: qty 4000

## 2023-12-06 MED ORDER — NITROGLYCERIN IN D5W 200-5 MCG/ML-% IV SOLN
0.0000 ug/min | INTRAVENOUS | Status: DC
Start: 2023-12-06 — End: 2023-12-08

## 2023-12-06 MED ORDER — ASPIRIN 81 MG PO CHEW: 324.0000 mg | CHEWABLE_TABLET | Freq: Once | ORAL | Status: DC

## 2023-12-06 MED ORDER — HEPARIN (PORCINE) 25000 UT/250ML-% IV SOLN
1150.0000 [IU]/h | INTRAVENOUS | Status: DC
  Administered 2023-12-06: 1000 [IU]/h via INTRAVENOUS
  Filled 2023-12-06: qty 250

## 2023-12-06 MED ORDER — SENNA 8.6 MG PO TABS: 1.0000 | ORAL_TABLET | Freq: Every day | ORAL | Status: DC | PRN

## 2023-12-06 MED ORDER — ASPIRIN 81 MG PO TBEC
81.0000 mg | DELAYED_RELEASE_TABLET | Freq: Every day | ORAL | Status: DC
  Administered 2023-12-07 – 2023-12-08 (×2): 81 mg via ORAL
  Filled 2023-12-06 (×2): qty 1
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
## 2023-12-06 NOTE — ED Triage Notes (Signed)
Patient reports left side chest pain, described as stabbing, that radiates to back and shoulder. Pain started at approx 8pm tonight. Reports n/v and shortness of breath.

## 2023-12-06 NOTE — ED Provider Notes (Signed)
[place] EMERGENCY DEPARTMENT AT [facility] Provider Note   CSN: [id number] Arrival date & time: 12/06/23  2113     History  Chief Complaint  Patient presents with   Chest Pain    Joann Meyers is a 38 y.o. female with past medical history of TIA, noncompliance, seizures, diabetes, hypertension, carotid artery dissection, NSTEMI, anemia presenting to emergency room with complaint of chest pain.  Patient reports left chest pain radiating to her left shoulder.  She reports this started around 8 PM tonight.  She reports associated nausea, vomiting shortness of breath.  Patient reports her symptoms worsened when she was walking.  Symptoms started while she was at work.  Reports this feels like heart attack she has had in the past. LMP 1/20. Thinks she might be pregnant.  Denies any blurry vision or change in vision, focal weakness, abdominal pain, diarrhea, cough fevers or chills.   Chest Pain      Home Medications Prior to Admission medications   Medication Sig Start Date End Date Taking? Authorizing Provider  acetaminophen (TYLENOL) 500 MG tablet Take 1,000 mg by mouth every 6 (six) hours as needed for moderate pain.    [provider]  aspirin EC 81 MG tablet Take 1 tablet (81 mg total) by mouth daily. Swallow whole. 10/22/23   Rondel Baton, MD  atorvastatin (LIPITOR) 80 MG tablet Take 1 tablet (80 mg total) by mouth daily. 10/29/23   Georganna Skeans, MD  baclofen (LIORESAL) 10 MG tablet Take 1 tablet (10 mg total) by mouth 2 (two) times daily as needed for muscle spasms. 10/24/23   Blitch, Linde Gillis, NP  Blood Glucose Monitoring Suppl DEVI 1 each by Does not apply route in the morning, at noon, and at bedtime. May substitute to any manufacturer covered by patient's insurance. 10/24/23   Blitch, Linde Gillis, NP  clopidogrel (PLAVIX) 75 MG tablet Take 1 tablet (75 mg total) by mouth daily. 10/22/23   Rondel Baton, MD  glipiZIDE (GLUCOTROL) 5 MG tablet Take 1  tablet (5 mg total) by mouth 2 (two) times daily before a meal. 10/22/23   Rondel Baton, MD  losartan (COZAAR) 25 MG tablet Take 1 tablet (25 mg total) by mouth daily. 10/22/23   Rondel Baton, MD  losartan-hydrochlorothiazide (HYZAAR) 100-25 MG tablet Take 1 tablet by mouth daily. 10/29/23   Georganna Skeans, MD  metFORMIN (GLUCOPHAGE) 1000 MG tablet Take 1 tablet (1,000 mg total) by mouth 2 (two) times daily at 8 am and 10 pm. 10/29/23   Georganna Skeans, MD  metoprolol succinate (TOPROL-XL) 25 MG 24 hr tablet Take 1 tablet (25 mg total) by mouth daily. 10/22/23   Rondel Baton, MD  nitroGLYCERIN (NITROSTAT) 0.4 MG SL tablet Place 1 tablet (0.4 mg total) under the tongue every 5 (five) minutes as needed for chest pain. 04/09/23   Ronney Asters, NP  sertraline (ZOLOFT) 50 MG tablet Take 1 tablet (50 mg total) by mouth daily. 10/29/23   Georganna Skeans, MD  triamcinolone cream (KENALOG) 0.1 % Apply 1 Application topically 2 (two) times daily. 10/24/23   Blitch, Linde Gillis, NP  insulin NPH Human (NOVOLIN N) 100 UNIT/ML injection Inject 0.1 mLs (10 Units total) into the skin 2 (two) times daily. Take at breakfast and at hs 10/13/21 10/27/21  Reva Bores, MD  metoCLOPramide (REGLAN) 10 MG tablet Take 1 tablet (10 mg total) by mouth 4 (four) times daily as needed for nausea or vomiting. 08/04/20  01/14/21  Anyanwu, Jethro Bastos, MD  prochlorperazine (COMPAZINE) 10 MG tablet Take 1 tablet (10 mg total) by mouth 2 (two) times daily as needed for nausea or vomiting. 04/25/19 08/12/19  Tegeler, Canary Brim, MD      Allergies    Fish-derived products, Morphine and codeine, Shellfish allergy, Tuna [fish allergy], Feraheme [ferumoxytol], Latex, and Pork-derived products    Review of Systems   Review of Systems  Cardiovascular:  Positive for chest pain.    Physical Exam Updated Vital Signs BP (!) 194/104 (BP Location: Right Arm)   Pulse (!) 111   Temp 97.9 F (36.6 C)   Resp 18   Ht 5\' 2"  (1.575 m)   Wt  96.2 kg   SpO2 100%   BMI 38.78 kg/m  Physical Exam Vitals and nursing note reviewed.  Constitutional:      General: She is not in acute distress.    Appearance: She is not toxic-appearing.  HENT:     Head: Normocephalic and atraumatic.  Eyes:     General: No scleral icterus.    Conjunctiva/sclera: Conjunctivae normal.  Cardiovascular:     Rate and Rhythm: Regular rhythm. Tachycardia present.     Pulses: Normal pulses.     Heart sounds: Normal heart sounds.     Comments: Patient's blood pressure equal bilaterally.  Radial pulse equal bilaterally.  Sinus tachycardic on monitor. Pulmonary:     Effort: Pulmonary effort is normal. No respiratory distress.     Breath sounds: Normal breath sounds.  Abdominal:     General: Abdomen is flat. Bowel sounds are normal.     Palpations: Abdomen is soft.     Tenderness: There is no abdominal tenderness.  Musculoskeletal:     Right lower leg: No edema.     Left lower leg: No edema.  Skin:    General: Skin is warm and dry.     Findings: No lesion.  Neurological:     General: No focal deficit present.     Mental Status: She is alert and oriented to person, place, and time. Mental status is at baseline.     Cranial Nerves: No cranial nerve deficit.     Sensory: No sensory deficit.     Motor: No weakness.     ED Results / Procedures / Treatments   Labs (all labs ordered are listed, but only abnormal results are displayed) Labs Reviewed  CBC - Abnormal; Notable for the following components:      Result Value   RBC 3.82 (*)    Hemoglobin 10.3 (*)    HCT 30.5 (*)    MCV 79.8 (*)    All other components within normal limits  BASIC METABOLIC PANEL  HCG, SERUM, QUALITATIVE  TROPONIN I (HIGH SENSITIVITY)    EKG None  Radiology DG Chest Port 1 View Result Date: 12/06/2023 CLINICAL DATA:  cp EXAM: PORTABLE CHEST 1 VIEW COMPARISON:  Chest x-ray 11/19/2023, CT angio chest 10/22/2023 FINDINGS: The heart and mediastinal contours are  within normal limits. Low lung volumes. No focal consolidation. No pulmonary edema. No pleural effusion. No pneumothorax. No acute osseous abnormality. IMPRESSION: Low lung volumes with no active disease. Electronically Signed   By: Tish Frederickson M.D.   On: 12/06/2023 21:49    Procedures Procedures    Medications Ordered in ED Medications - No data to display  ED Course/ Medical Decision Making/ A&P Clinical Course as of 12/06/23 2258  Thu Dec 06, 2023  2243 Spoke with cardiology Harrold Donath  Geraldo Pitter who recommended treating with heparin, aspirin and IV nitro drip for continued chest pain.  [JB]  2243 Spoke with Dr. Donavan Foil OB/GYN making him aware patient was hypertension, positive pregnancy test and elevated troponin.  [JB]    Clinical Course User Index [JB] Ryder Chesmore, Horald Chestnut, PA-C                                 Medical Decision Making Amount and/or Complexity of Data Reviewed Labs: ordered.  Risk OTC drugs. Prescription drug management.   Ariana Parsell 38 y.o. presented today for chest pain. Working DDx that I considered at this time includes, but not limited to, ACS, GERD, pe, pna, aortic dissection, pneumothorax, MSK path, anemia, esophageal rupture, CHF exacerbation, valvular disorder, myocarditis, pericarditis, endocarditis, pericardial effusion/cardiac tamponade, pulmonary edema, gastritis/PUD, esophagitis.  Review of prior external notes: Patient has been seen for chest pain recently October 24, 2023  Unique Tests and My Interpretation:  EKG: Rate, rhythm, axis, intervals all examined: sinus tachycardia  CXR: No acute pathology Troponin: 800, repeat pending  Hepatic panel within normal limits.  CBC without leukocytosis hemoglobin is 10.3 BMP without significant electrolyte abnormality Qualitative positive, quant 2700 consistent with estimated 4 weeks  Problem List / ED Course / Critical interventions / Medication management  Reporting chest pain.  Chest pain started on 8  PM severe radiating to her left shoulder.  Patient has history of coronary artery disease, hypertension hyperlipidemia.  Consulted cardiology who reviewed patient's chart labs and agreed to see patient.  They make the recommendation of starting heparin for NSTEMI, nitro drip and aspirin.  They recommend admit to medicine.  They will see patient. Consulted OB/GYN who does recommended treating NSTEMI.  They have no formal recommendations at this time but they were made of aware of patient being admitted to medicine. That she is ordering lactic and BNP. I ordered medication including heparin, aspirin, nitro Reevaluation of the patient after these medicines showed that the patient improved Patients vitals assessed. Upon arrival patient is hemodynamically stable slightly tachycardic  I have reviewed the patients home medicines and have made adjustments as needed   Consulting to admitting team for admission.   Plan:  Admit for NSTEMI Hospitalist agrees to admit          Final Clinical Impression(s) / ED Diagnoses Final diagnoses:  NSTEMI (non-ST elevated myocardial infarction) (HCC)  Less than [redacted] weeks gestation of pregnancy    Rx / DC Orders ED Discharge Orders     None         Raford Pitcher Evalee Jefferson 12/06/23 2309    Gerhard Munch, MD 12/06/23 [phone]
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
## 2023-12-06 NOTE — ED Provider Triage Note (Signed)
Emergency Medicine Provider Triage Evaluation Note  Joann Meyers , a 38 y.o. female  was evaluated in triage.  Pt complains of chest pain that began at 8 PM.  Patient states that she did have vomiting this morning but is unsure if she has been recently ill with a virus.  Patient does have history of NSTEMI along with significant cardiac history and is currently on aspirin daily which she takes.  Patient does have nitro at home however has not taken it.  Patient states her blood pressure was 213 systolic before coming here.  Chest pain radiates from the left side of her chest to her left shoulder.  Patient states this feels different than her NSTEMI.  Patient is concerned that she is pregnant.  Review of Systems  Positive:  Negative:   Physical Exam  BP (!) 194/104 (BP Location: Right Arm)   Pulse (!) 111   Temp 97.9 F (36.6 C)   Resp 18   SpO2 100%  Gen:   Awake, in distress   Resp:  Normal effort  MSK:   Moves extremities without difficulty  Other:    Medical Decision Making  Medically screening exam initiated at 9:22 PM.  Appropriate orders placed.  Rande Willbanks was informed that the remainder of the evaluation will be completed by another provider, this initial triage assessment does not replace that evaluation, and the importance of remaining in the ED until their evaluation is complete.  Patient in obvious distress with tachycardia and hypertension.  Patient does have significant cardiac history including NSTEMI and so charge was notified as patient does have mild EKG changes in V2.  Patient thinks that she may be pregnant as well but upon chart review from earlier this month she had a negative pregnancy test.   Netta Corrigan, PA-C 12/06/23 2124

## 2023-12-07 ENCOUNTER — Other Ambulatory Visit (HOSPITAL_COMMUNITY): Payer: Self-pay

## 2023-12-07 ENCOUNTER — Inpatient Hospital Stay (HOSPITAL_COMMUNITY): Payer: Medicaid Other

## 2023-12-07 ENCOUNTER — Other Ambulatory Visit (HOSPITAL_COMMUNITY): Payer: Medicaid Other

## 2023-12-07 ENCOUNTER — Encounter (HOSPITAL_COMMUNITY): Payer: Self-pay | Admitting: Family Medicine

## 2023-12-07 ENCOUNTER — Inpatient Hospital Stay (HOSPITAL_COMMUNITY)
Admission: EM | Disposition: A | Discharge status: Home / Self Care | Payer: Self-pay | Source: Home / Self Care | Attending: Student

## 2023-12-07 DIAGNOSIS — I2510 Atherosclerotic heart disease of native coronary artery without angina pectoris: Secondary | ICD-10-CM

## 2023-12-07 DIAGNOSIS — Z3491 Encounter for supervision of normal pregnancy, unspecified, first trimester: Secondary | ICD-10-CM

## 2023-12-07 DIAGNOSIS — I214 Non-ST elevation (NSTEMI) myocardial infarction: Secondary | ICD-10-CM

## 2023-12-07 DIAGNOSIS — I10 Essential (primary) hypertension: Secondary | ICD-10-CM

## 2023-12-07 DIAGNOSIS — Z3490 Encounter for supervision of normal pregnancy, unspecified, unspecified trimester: Secondary | ICD-10-CM

## 2023-12-07 DIAGNOSIS — I160 Hypertensive urgency: Secondary | ICD-10-CM

## 2023-12-07 HISTORY — PX: CORONARY ULTRASOUND/IVUS: CATH118244

## 2023-12-07 HISTORY — PX: CORONARY BALLOON ANGIOPLASTY: CATH118233

## 2023-12-07 HISTORY — PX: LEFT HEART CATH AND CORONARY ANGIOGRAPHY: CATH118249

## 2023-12-07 LAB — GLUCOSE, CAPILLARY
Glucose-Capillary: 245 mg/dL — ABNORMAL HIGH (ref 70–99)
Glucose-Capillary: 264 mg/dL — ABNORMAL HIGH (ref 70–99)
Glucose-Capillary: 317 mg/dL — ABNORMAL HIGH (ref 70–99)
Glucose-Capillary: 355 mg/dL — ABNORMAL HIGH (ref 70–99)

## 2023-12-07 LAB — CBC
HCT: 26.6 % — ABNORMAL LOW (ref 36.0–46.0)
Hemoglobin: 9.1 g/dL — ABNORMAL LOW (ref 12.0–15.0)
MCH: 27 pg (ref 26.0–34.0)
MCHC: 34.2 g/dL (ref 30.0–36.0)
MCV: 78.9 fL — ABNORMAL LOW (ref 80.0–100.0)
Platelets: 219 10*3/uL (ref 150–400)
RBC: 3.37 MIL/uL — ABNORMAL LOW (ref 3.87–5.11)
RDW: 15.3 % (ref 11.5–15.5)
WBC: 6.7 10*3/uL (ref 4.0–10.5)
nRBC: 0 % (ref 0.0–0.2)

## 2023-12-07 LAB — URINALYSIS, ROUTINE W REFLEX MICROSCOPIC
Bilirubin Urine: NEGATIVE
Glucose, UA: >=500 mg/dL — AB
Ketones, ur: NEGATIVE mg/dL
Nitrite: NEGATIVE
Protein, ur: >=300 mg/dL — AB
Specific Gravity, Urine: 1.022 (ref 1.005–1.030)
pH: 6 (ref 5.0–8.0)

## 2023-12-07 LAB — ECHOCARDIOGRAM COMPLETE
Area-P 1/2: 5.27 cm2
Height: 62 in
S' Lateral: 3.5 cm
Weight: 3392 [oz_av]

## 2023-12-07 LAB — POCT ACTIVATED CLOTTING TIME: Activated Clotting Time: 314 s

## 2023-12-07 LAB — BASIC METABOLIC PANEL
Anion gap: 9 (ref 5–15)
BUN: 15 mg/dL (ref 6–20)
CO2: 23 mmol/L (ref 22–32)
Calcium: 8.9 mg/dL (ref 8.9–10.3)
Chloride: 104 mmol/L (ref 98–111)
Creatinine, Ser: 0.89 mg/dL (ref 0.44–1.00)
GFR, Estimated: >60 mL/min (ref >60)
Glucose, Bld: 310 mg/dL — ABNORMAL HIGH (ref 70–99)
Potassium: 3.6 mmol/L (ref 3.5–5.1)
Sodium: 136 mmol/L (ref 135–145)

## 2023-12-07 LAB — BRAIN NATRIURETIC PEPTIDE: B Natriuretic Peptide: 176.4 pg/mL — ABNORMAL HIGH (ref 0.0–100.0)

## 2023-12-07 LAB — HEPARIN LEVEL (UNFRACTIONATED): Heparin Unfractionated: 0.11 [IU]/mL — ABNORMAL LOW (ref 0.30–0.70)

## 2023-12-07 LAB — CBG MONITORING, ED
Glucose-Capillary: 278 mg/dL — ABNORMAL HIGH (ref 70–99)
Glucose-Capillary: 300 mg/dL — ABNORMAL HIGH (ref 70–99)
Glucose-Capillary: 446 mg/dL — ABNORMAL HIGH (ref 70–99)

## 2023-12-07 LAB — TROPONIN I (HIGH SENSITIVITY)
Troponin I (High Sensitivity): 883 ng/L (ref <18)
Troponin I (High Sensitivity): 7252 ng/L (ref <18)
Troponin I (High Sensitivity): 11511 ng/L (ref <18)

## 2023-12-07 LAB — MAGNESIUM: Magnesium: 1.9 mg/dL (ref 1.7–2.4)

## 2023-12-07 SURGERY — LEFT HEART CATH AND CORONARY ANGIOGRAPHY
Anesthesia: LOCAL

## 2023-12-07 MED ORDER — OXYCODONE HCL 5 MG PO TABS
ORAL_TABLET | ORAL | Status: AC
  Administered 2023-12-07: 5 mg via ORAL
  Filled 2023-12-07: qty 1

## 2023-12-07 MED ORDER — INSULIN ASPART 100 UNIT/ML IJ SOLN
10.0000 [IU] | Freq: Once | INTRAMUSCULAR | Status: AC
  Administered 2023-12-07: 10 [IU] via SUBCUTANEOUS

## 2023-12-07 MED ORDER — SODIUM CHLORIDE 0.9 % WEIGHT BASED INFUSION
3.0000 mL/kg/h | INTRAVENOUS | Status: AC
Start: 2023-12-07 — End: 2023-12-07
  Administered 2023-12-07: 3 mL/kg/h via INTRAVENOUS

## 2023-12-07 MED ORDER — LIDOCAINE HCL (PF) 1 % IJ SOLN
INTRAMUSCULAR | Status: DC | PRN
  Administered 2023-12-07: 2 mL

## 2023-12-07 MED ORDER — DIPHENHYDRAMINE HCL 50 MG/ML IJ SOLN
INTRAMUSCULAR | Status: AC
Start: 2023-12-07 — End: 2023-12-07
  Administered 2023-12-07: 50 mg
  Filled 2023-12-07: qty 1

## 2023-12-07 MED ORDER — SODIUM CHLORIDE 0.9% FLUSH
3.0000 mL | Freq: Two times a day (BID) | INTRAVENOUS | Status: DC
  Administered 2023-12-08: 3 mL via INTRAVENOUS

## 2023-12-07 MED ORDER — IOHEXOL 350 MG/ML SOLN
INTRAVENOUS | Status: DC | PRN
  Administered 2023-12-07: 90 mL

## 2023-12-07 MED ORDER — VERAPAMIL HCL 2.5 MG/ML IV SOLN
INTRAVENOUS | Status: DC | PRN
  Administered 2023-12-07 (×2): 10 mL via INTRA_ARTERIAL

## 2023-12-07 MED ORDER — INSULIN GLARGINE 100 UNIT/ML ~~LOC~~ SOLN
15.0000 [IU] | Freq: Every day | SUBCUTANEOUS | Status: DC
  Administered 2023-12-07: 15 [IU] via SUBCUTANEOUS
  Filled 2023-12-07 (×2): qty 0.15

## 2023-12-07 MED ORDER — FENTANYL CITRATE (PF) 100 MCG/2ML IJ SOLN
INTRAMUSCULAR | Status: DC | PRN
  Administered 2023-12-07: 25 ug via INTRAVENOUS
  Administered 2023-12-07: 50 ug via INTRAVENOUS

## 2023-12-07 MED ORDER — SODIUM CHLORIDE 0.9 % IV SOLN: INTRAVENOUS | Status: AC

## 2023-12-07 MED ORDER — MIDAZOLAM HCL 2 MG/2ML IJ SOLN
INTRAMUSCULAR | Status: AC
  Filled 2023-12-07: qty 2

## 2023-12-07 MED ORDER — LIDOCAINE HCL (PF) 1 % IJ SOLN
INTRAMUSCULAR | Status: AC
  Filled 2023-12-07: qty 30

## 2023-12-07 MED ORDER — BIVALIRUDIN BOLUS VIA INFUSION - CUPID
INTRAVENOUS | Status: DC | PRN
Start: 2023-12-07 — End: 2023-12-07
  Administered 2023-12-07: 72.15 mg via INTRAVENOUS

## 2023-12-07 MED ORDER — CLOPIDOGREL BISULFATE 300 MG PO TABS
ORAL_TABLET | ORAL | Status: AC
  Filled 2023-12-07: qty 2

## 2023-12-07 MED ORDER — SODIUM CHLORIDE 0.9 % IV SOLN
INTRAVENOUS | Status: AC | PRN
  Administered 2023-12-07: 1000 mL

## 2023-12-07 MED ORDER — HEPARIN BOLUS VIA INFUSION
2000.0000 [IU] | Freq: Once | INTRAVENOUS | Status: AC
  Administered 2023-12-07: 2000 [IU] via INTRAVENOUS
  Filled 2023-12-07: qty 2000

## 2023-12-07 MED ORDER — FENTANYL CITRATE (PF) 100 MCG/2ML IJ SOLN
INTRAMUSCULAR | Status: AC
  Filled 2023-12-07: qty 2

## 2023-12-07 MED ORDER — CLOPIDOGREL BISULFATE 300 MG PO TABS
ORAL_TABLET | ORAL | Status: DC | PRN
  Administered 2023-12-07: 600 mg via ORAL

## 2023-12-07 MED ORDER — SODIUM CHLORIDE 0.9 % IV SOLN
1.0000 g | INTRAVENOUS | Status: DC
  Administered 2023-12-07: 1 g via INTRAVENOUS
  Filled 2023-12-07: qty 10

## 2023-12-07 MED ORDER — INSULIN ASPART 100 UNIT/ML IJ SOLN
4.0000 [IU] | Freq: Three times a day (TID) | INTRAMUSCULAR | Status: DC
  Administered 2023-12-07 – 2023-12-08 (×2): 4 [IU] via SUBCUTANEOUS

## 2023-12-07 MED ORDER — ACETAMINOPHEN 325 MG PO TABS
ORAL_TABLET | ORAL | Status: AC
Start: 2023-12-07 — End: 2023-12-07
  Administered 2023-12-07: 650 mg via ORAL
  Filled 2023-12-07: qty 2

## 2023-12-07 MED ORDER — LABETALOL HCL 5 MG/ML IV SOLN: 10.0000 mg | INTRAVENOUS | Status: AC | PRN

## 2023-12-07 MED ORDER — NITROGLYCERIN 1 MG/10 ML FOR IR/CATH LAB
INTRA_ARTERIAL | Status: AC
  Filled 2023-12-07: qty 10

## 2023-12-07 MED ORDER — INSULIN GLARGINE-YFGN 100 UNIT/ML ~~LOC~~ SOLN
15.0000 [IU] | Freq: Every day | SUBCUTANEOUS | Status: DC
  Administered 2023-12-08: 15 [IU] via SUBCUTANEOUS
  Filled 2023-12-07: qty 0.15

## 2023-12-07 MED ORDER — SODIUM CHLORIDE 0.9 % WEIGHT BASED INFUSION: 1.0000 mL/kg/h | INTRAVENOUS | Status: DC

## 2023-12-07 MED ORDER — SODIUM CHLORIDE 0.9% FLUSH: 3.0000 mL | INTRAVENOUS | Status: DC | PRN

## 2023-12-07 MED ORDER — MIDAZOLAM HCL 2 MG/2ML IJ SOLN
INTRAMUSCULAR | Status: DC | PRN
  Administered 2023-12-07 (×2): 1 mg via INTRAVENOUS

## 2023-12-07 MED ORDER — VERAPAMIL HCL 2.5 MG/ML IV SOLN
INTRAVENOUS | Status: AC
  Filled 2023-12-07: qty 2

## 2023-12-07 MED ORDER — SODIUM CHLORIDE 0.9 % IV SOLN
INTRAVENOUS | Status: DC | PRN
  Administered 2023-12-07 (×2): 1.75 mg/kg/h via INTRAVENOUS

## 2023-12-07 MED ORDER — SODIUM CHLORIDE 0.9 % IV SOLN: 250.0000 mL | INTRAVENOUS | Status: DC | PRN

## 2023-12-07 MED ORDER — HYDRALAZINE HCL 20 MG/ML IJ SOLN: 10.0000 mg | INTRAMUSCULAR | Status: AC | PRN

## 2023-12-07 MED ORDER — DIPHENHYDRAMINE HCL 25 MG PO CAPS
25.0000 mg | ORAL_CAPSULE | Freq: Three times a day (TID) | ORAL | Status: DC | PRN
  Administered 2023-12-07: 25 mg via ORAL
  Filled 2023-12-07: qty 1

## 2023-12-07 MED ORDER — INFLUENZA VIRUS VACC SPLIT PF (FLUZONE) 0.5 ML IM SUSY: 0.5000 mL | PREFILLED_SYRINGE | INTRAMUSCULAR | Status: DC

## 2023-12-07 SURGICAL SUPPLY — 23 items
BALLN ~~LOC~~ EMERGE MR 2.5X12 (BALLOONS) ×1 IMPLANT
BALLN ~~LOC~~ EMERGE MR 3.25X20 (BALLOONS) ×1 IMPLANT
BALLOON ~~LOC~~ EMERGE MR 2.5X12 (BALLOONS) IMPLANT
BALLOON ~~LOC~~ EMERGE MR 3.25X20 (BALLOONS) IMPLANT
CATH INFINITI 5FR JL4 (CATHETERS) ×1 IMPLANT
CATH INFINITI AMBI 5FR TG (CATHETERS) ×1 IMPLANT
CATH INFINITI JR4 5F (CATHETERS) ×1 IMPLANT
CATH LAUNCHER 6FR EBU3.5 (CATHETERS) ×1 IMPLANT
CATH OPTICROSS HD (CATHETERS) ×1 IMPLANT
COVER PRB 48X5XTLSCP FOLD TPE (BAG) ×1 IMPLANT
DEVICE RAD COMP TR BAND LRG (VASCULAR PRODUCTS) ×1 IMPLANT
ELECT DEFIB PAD ADLT CADENCE (PAD) ×1 IMPLANT
GLIDESHEATH SLEND A-KIT 6F 22G (SHEATH) ×1 IMPLANT
GUIDEWIRE INQWIRE 1.5J.035X260 (WIRE) IMPLANT
INQWIRE 1.5J .035X260CM (WIRE) ×1 IMPLANT
KIT ENCORE 26 ADVANTAGE (KITS) ×1 IMPLANT
KIT HEMO VALVE WATCHDOG (MISCELLANEOUS) ×1 IMPLANT
KIT SINGLE USE MANIFOLD (KITS) ×1 IMPLANT
PACK CARDIAC CATHETERIZATION (CUSTOM PROCEDURE TRAY) ×2 IMPLANT
SET ATX-X65L (MISCELLANEOUS) ×1 IMPLANT
SLED PULL BACK IVUS (MISCELLANEOUS) ×1 IMPLANT
WIRE ASAHI PROWATER 180CM (WIRE) ×1 IMPLANT
WIRE HI TORQ VERSACORE-J 145CM (WIRE) ×1 IMPLANT

## 2023-12-07 NOTE — Progress Notes (Signed)
TR band causing 10/10 pain at arterial access site. Assessed by cath lab RN Renae Fickle; placement and patency found to be suboptimal. New TR band placed using manual BP cuff and manual compression of radial & ulnar arteries. Inflated to 12 with reverse barbeau of A. Inflation at 1249. Pain decreased to 2/10.   New rash visualized on right hand/wrist. Pt reports the site feeling hot. Messaged findings to Dr Alanda Slim. Benadryl ordered, given.

## 2023-12-07 NOTE — H&P (View-Only) (Signed)
Rounding Note    Patient Name: Joann Meyers Date of Encounter: 12/07/2023  [facility] Cardiologist: Thomasene Ripple, DO   Subjective   Patient reports she is now pain free. No dyspnea. Just found out yesterday she was pregnant.  Inpatient Medications    Scheduled Meds:  aspirin EC  81 mg Oral Daily   clopidogrel  75 mg Oral Daily   insulin aspart  0-9 Units Subcutaneous Q4H   metoprolol tartrate  12.5 mg Oral BID   prenatal vitamin w/FE, FA  1 tablet Oral Q1200   Continuous Infusions:  sodium chloride     Followed by   sodium chloride     heparin 1,150 Units/hr (12/07/23 0444)   nitroGLYCERIN 50 mcg/min (12/07/23 0451)   PRN Meds: acetaminophen, ondansetron (ZOFRAN) IV, oxyCODONE, polyethylene glycol, senna   Vital Signs    Vitals:   12/07/23 0621 12/07/23 0630 12/07/23 0700 12/07/23 0714  BP: (!) 144/81 133/76 (!) 147/80   Pulse: 82 80 80   Resp: 18 (!) 36 20   Temp:    97.8 F (36.6 C)  TempSrc:    Temporal  SpO2: 98% 100% 100%   Weight:      Height:       No intake or output data in the 24 hours ending 12/07/23 0812    12/06/2023    9:22 PM 10/24/2023    7:32 PM 10/22/2023    5:09 PM  Last 3 Weights  Weight (lbs) 212 lb 201 lb 15.1 oz 201 lb 15.1 oz  Weight (kg) 96.163 kg 91.6 kg 91.6 kg      Telemetry    NSR with PVCs - Personally Reviewed  ECG    NSR. Mild ST elevation V2 only - Personally Reviewed  Physical Exam   GEN: No acute distress.  obese Neck: No JVD Cardiac: RRR, no murmurs, rubs, or gallops. Pulses 2+ Respiratory: Clear to auscultation bilaterally. GI: Soft, nontender, non-distended  MS: No edema; No deformity. Neuro:  Nonfocal  Psych: Normal affect   Labs    High Sensitivity Troponin:   Recent Labs  Lab 12/06/23 2125 12/06/23 2310 12/07/23 0337 12/07/23 0539  TROPONINIHS 806* 883* 7,252* 11,511*     Chemistry Recent Labs  Lab 12/06/23 2125 12/07/23 0337  NA 131* 136  K 4.1 3.6  CL 98 104  CO2 20* 23   GLUCOSE 432* 310*  BUN 16 15  CREATININE 0.97 0.89  CALCIUM 9.3 8.9  MG  --  1.9  PROT 6.9  --   ALBUMIN 3.6  --   AST 22  --   ALT 15  --   ALKPHOS 59  --   BILITOT 0.8  --   GFRNONAA >60 >60  ANIONGAP 13 9    Lipids No results for input(s): "CHOL", "TRIG", "HDL", "LABVLDL", "LDLCALC", "CHOLHDL" in the last 168 hours.  Hematology Recent Labs  Lab 12/06/23 2125 12/07/23 0337  WBC 6.7 6.7  RBC 3.82* 3.37*  HGB 10.3* 9.1*  HCT 30.5* 26.6*  MCV 79.8* 78.9*  MCH 27.0 27.0  MCHC 33.8 34.2  RDW 15.5 15.3  PLT 213 219   Thyroid No results for input(s): "TSH", "FREET4" in the last 168 hours.  BNP Recent Labs  Lab 12/06/23 2310  BNP 176.4*    DDimer No results for input(s): "DDIMER" in the last 168 hours.   Radiology    DG Chest Port 1 View Result Date: 12/06/2023 CLINICAL DATA:  cp EXAM: PORTABLE CHEST 1 VIEW  COMPARISON:  Chest x-ray 11/19/2023, CT angio chest 10/22/2023 FINDINGS: The heart and mediastinal contours are within normal limits. Low lung volumes. No focal consolidation. No pulmonary edema. No pleural effusion. No pneumothorax. No acute osseous abnormality. IMPRESSION: Low lung volumes with no active disease. Electronically Signed   By: Tish Frederickson M.D.   On: 12/06/2023 21:49    Cardiac Studies   Cardiac cath 03/05/23:  LEFT HEART CATH AND CORONARY ANGIOGRAPHY  CORONARY PRESSURE/FFR STUDY  CORONARY STENT INTERVENTION   Conclusion  Conclusions: Multivessel coronary artery disease, as detailed below.  Potential culprit lesions for the patient's NSTEMI include sequential 90% and 95% D1 lesions and tandem 80% and 60% LCx stenoses. Mildly elevated left ventricular filling pressure (LVEDP 20 mmHg). Successful RFR-guided PCI to LCx using Synergy 2.5 x 32 mm drug-eluting stent with 0% residual stenosis and TIMI-3 flow. Successful PCI to proximal D1 lesion using Synergy 2.25 x 12 mm drug-eluting stent and PTCA to lateral branch of D1 leading to 0% residual  stenosis in the proximal segment and reduction of stenosis from 95% to 50% in the lateral branch and reestablishment of TIMI-3 flow.   Recommendations: Dual antiplatelet therapy with aspirin and ticagrelor for at least 12 months. Aggressive secondary prevention of coronary artery disease.   Yvonne Kendall, MD [facility] Coronary Diagrams  Diagnostic Dominance: Right  Intervention    Echo 03/05/23: IMPRESSIONS     1. Left ventricular ejection fraction, by estimation, is 55 to 60%. The  left ventricle has normal function. The left ventricle has no regional  wall motion abnormalities. Left ventricular diastolic parameters are  indeterminate.   2. Right ventricular systolic function is normal. The right ventricular  size is normal.   3. The mitral valve is normal in structure. Trivial mitral valve  regurgitation. No evidence of mitral stenosis.   4. The aortic valve is tricuspid. Aortic valve regurgitation is mild. No  aortic stenosis is present.   5. The inferior vena cava is normal in size with greater than 50%  respiratory variability, suggesting right atrial pressure of 3 mmHg.   Comparison(s): No prior Echocardiogram.   Conclusion(s)/Recommendation(s): Otherwise normal echocardiogram, with  minor abnormalities described in the report.   Patient Profile     38 y.o. female with medical history significant for poorly controlled type 2 diabetes mellitus, hypertension, history of right carotid artery dissection with CVA, and CAD s/p PCI to LCx and D1 in May 2024 who is seen at the request of Dr Antionette Char for evaluation of NSTEMI.  Assessment & Plan    NSTEMI. Troponin up to 11K. Patient seen multiple times in the ED in last month for chest pain with negative troponins. Now with more persistent chest pain and elevated troponin. She is s/p stenting of diagonal branch and LCx in May 2024. Strong concern for restenosis/stent thrombosis in setting of poorly controlled DM and HTN.  Patient now found to be pregnant which complicates treatment options. Now on IV heparin and Ntg and chest pain resolved. Given unstable coronary picture I feel we need to proceed with cardiac cath to define anatomy and treatment options. With pregnancy will shield abdomen as well as possible and limit radiation dose as much as reasonable. Will continue DAPT. Ok to use beta blockers with pregnancy. The procedure and risks were reviewed including but not limited to death, myocardial infarction, stroke, arrythmias, bleeding, transfusion, emergency surgery, dye allergy, or renal dysfunction. The patient voices understanding and is agreeable to proceed. Recommend left radial access (see  prior cath note concerning tortuosity of right brachiocephalic and subclavian arteries. Will update Echo.  DM type 2 poorly controlled. Per primary team HTN. Improved now. Will need to push beta blocker dose as other options for HTN limited.  First trimester pregnancy. Patient very high risk pregnancy. Surprising that there does not appear to have been any cardiac input/counseling concerning her pregnancy.  History of carotid artery dissection/CVA HLD. No good treatment options at this time with pregnancy. Will update lipid panel.  Morbid obesity. Anemia- evaluation per primary team.       For questions or updates, please contact [facility] Please consult [email or web address] for contact info under        Signed, Kristelle Cavallaro [place], MD  12/07/2023, 8:12 AM

## 2023-12-07 NOTE — Interval H&P Note (Signed)
History and Physical Interval Note:  12/07/2023 9:46 AM  Joann Meyers  has presented today for surgery, with the diagnosis of NSTEMI.  The various methods of treatment have been discussed with the patient and family. After consideration of risks, benefits and other options for treatment, the patient has consented to  Procedure(s): LEFT HEART CATH AND CORONARY ANGIOGRAPHY (N/A) as a surgical intervention.  The patient's history has been reviewed, patient examined, no change in status, stable for surgery.  I have reviewed the patient's chart and labs.  Questions were answered to the patient's satisfaction.     Jannelle Notaro J Brendon Christoffel

## 2023-12-07 NOTE — ED Notes (Signed)
Provider notified of critical troponin

## 2023-12-07 NOTE — ED Notes (Signed)
MD Opyd made aware of CBG 446
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
## 2023-12-07 NOTE — Discharge Instructions (Addendum)
Information about your medication: Plavix (anti-platelet agent)  Generic Name (Brand): clopidogrel (Plavix), once daily medication  PURPOSE: You are taking this medication along with aspirin to lower your chance of having a heart attack, stroke, or blood clots in your heart stent. These can be fatal. Plavix and aspirin help prevent platelets from sticking together and forming a clot that can block an artery or your stent.   Common SIDE EFFECTS you may experience include: bruising or bleeding more easily, shortness of breath  Do not stop taking PLAVIX without talking to the doctor who prescribes it for you. People who are treated with a stent and stop taking Plavix too soon, have a higher risk of getting a blood clot in the stent, having a heart attack, or dying. If you stop Plavix because of bleeding, or for other reasons, your risk of a heart attack or stroke may increase.   Avoid taking NSAID agents or anti-inflammatory medications such as ibuprofen, naproxen given increased bleed risk with plavix - can use acetaminophen (Tylenol) if needed for pain.  Avoid taking over the counter stomach medications omeprazole (Prilosec) or esomeprazole (Nexium) since these do interact and make plavix less effective - ask your pharmacist or doctor for alterative agents if needed for heartburn or GERD.   Tell all of your doctors and dentists that you are taking Plavix. They should talk to the doctor who prescribed Plavix for you before you have any surgery or invasive procedure.   Contact your health care provider if you experience: severe or uncontrollable bleeding, pink/red/brown urine, vomiting blood or vomit that looks like "coffee grounds", red or black stools (looks like tar), coughing up blood or blood clots ----------------------------------------------------------------------------------------------------------------------    Diabetes in Pregnancy -   Check fasting blood sugar first thing in the  mornings before you eat when you wake up (you want it to be between 70-90) -   Check glucose 2 hours after your meal (you want it to be less than 140) -   Your blood sugar is too low below 70. Follow hypoglycemia directions on handout to get it back into normal range  -   You will need to be on 2 different insulins long acting and short acting to cover your meal intake -   reduce the amount of carbohydrates that you eat per meal to no more than 30 grams at breakfast and no more than 45 grams with the other meals during the day. -   get regular exercise everyday when cleared by cardiology.

## 2023-12-07 NOTE — Plan of Care (Signed)

## 2023-12-07 NOTE — Progress Notes (Signed)
Rounding Note    Patient Name: Joann Meyers Date of Encounter: 12/07/2023  [facility] Cardiologist: Thomasene Ripple, DO   Subjective   Patient reports she is now pain free. No dyspnea. Just found out yesterday she was pregnant.  Inpatient Medications    Scheduled Meds:  aspirin EC  81 mg Oral Daily   clopidogrel  75 mg Oral Daily   insulin aspart  0-9 Units Subcutaneous Q4H   metoprolol tartrate  12.5 mg Oral BID   prenatal vitamin w/FE, FA  1 tablet Oral Q1200   Continuous Infusions:  sodium chloride     Followed by   sodium chloride     heparin 1,150 Units/hr (12/07/23 0444)   nitroGLYCERIN 50 mcg/min (12/07/23 0451)   PRN Meds: acetaminophen, ondansetron (ZOFRAN) IV, oxyCODONE, polyethylene glycol, senna   Vital Signs    Vitals:   12/07/23 0621 12/07/23 0630 12/07/23 0700 12/07/23 0714  BP: (!) 144/81 133/76 (!) 147/80   Pulse: 82 80 80   Resp: 18 (!) 36 20   Temp:    97.8 F (36.6 C)  TempSrc:    Temporal  SpO2: 98% 100% 100%   Weight:      Height:       No intake or output data in the 24 hours ending 12/07/23 0812    12/06/2023    9:22 PM 10/24/2023    7:32 PM 10/22/2023    5:09 PM  Last 3 Weights  Weight (lbs) 212 lb 201 lb 15.1 oz 201 lb 15.1 oz  Weight (kg) 96.163 kg 91.6 kg 91.6 kg      Telemetry    NSR with PVCs - Personally Reviewed  ECG    NSR. Mild ST elevation V2 only - Personally Reviewed  Physical Exam   GEN: No acute distress.  obese Neck: No JVD Cardiac: RRR, no murmurs, rubs, or gallops. Pulses 2+ Respiratory: Clear to auscultation bilaterally. GI: Soft, nontender, non-distended  MS: No edema; No deformity. Neuro:  Nonfocal  Psych: Normal affect   Labs    High Sensitivity Troponin:   Recent Labs  Lab 12/06/23 2125 12/06/23 2310 12/07/23 0337 12/07/23 0539  TROPONINIHS 806* 883* 7,252* 11,511*     Chemistry Recent Labs  Lab 12/06/23 2125 12/07/23 0337  NA 131* 136  K 4.1 3.6  CL 98 104  CO2 20* 23   GLUCOSE 432* 310*  BUN 16 15  CREATININE 0.97 0.89  CALCIUM 9.3 8.9  MG  --  1.9  PROT 6.9  --   ALBUMIN 3.6  --   AST 22  --   ALT 15  --   ALKPHOS 59  --   BILITOT 0.8  --   GFRNONAA >60 >60  ANIONGAP 13 9    Lipids No results for input(s): "CHOL", "TRIG", "HDL", "LABVLDL", "LDLCALC", "CHOLHDL" in the last 168 hours.  Hematology Recent Labs  Lab 12/06/23 2125 12/07/23 0337  WBC 6.7 6.7  RBC 3.82* 3.37*  HGB 10.3* 9.1*  HCT 30.5* 26.6*  MCV 79.8* 78.9*  MCH 27.0 27.0  MCHC 33.8 34.2  RDW 15.5 15.3  PLT 213 219   Thyroid No results for input(s): "TSH", "FREET4" in the last 168 hours.  BNP Recent Labs  Lab 12/06/23 2310  BNP 176.4*    DDimer No results for input(s): "DDIMER" in the last 168 hours.   Radiology    DG Chest Port 1 View Result Date: 12/06/2023 CLINICAL DATA:  cp EXAM: PORTABLE CHEST 1 VIEW  COMPARISON:  Chest x-ray 11/19/2023, CT angio chest 10/22/2023 FINDINGS: The heart and mediastinal contours are within normal limits. Low lung volumes. No focal consolidation. No pulmonary edema. No pleural effusion. No pneumothorax. No acute osseous abnormality. IMPRESSION: Low lung volumes with no active disease. Electronically Signed   By: Tish Frederickson M.D.   On: 12/06/2023 21:49    Cardiac Studies   Cardiac cath 03/05/23:  LEFT HEART CATH AND CORONARY ANGIOGRAPHY  CORONARY PRESSURE/FFR STUDY  CORONARY STENT INTERVENTION   Conclusion  Conclusions: Multivessel coronary artery disease, as detailed below.  Potential culprit lesions for the patient's NSTEMI include sequential 90% and 95% D1 lesions and tandem 80% and 60% LCx stenoses. Mildly elevated left ventricular filling pressure (LVEDP 20 mmHg). Successful RFR-guided PCI to LCx using Synergy 2.5 x 32 mm drug-eluting stent with 0% residual stenosis and TIMI-3 flow. Successful PCI to proximal D1 lesion using Synergy 2.25 x 12 mm drug-eluting stent and PTCA to lateral branch of D1 leading to 0% residual  stenosis in the proximal segment and reduction of stenosis from 95% to 50% in the lateral branch and reestablishment of TIMI-3 flow.   Recommendations: Dual antiplatelet therapy with aspirin and ticagrelor for at least 12 months. Aggressive secondary prevention of coronary artery disease.   Yvonne Kendall, MD [facility] Coronary Diagrams  Diagnostic Dominance: Right  Intervention    Echo 03/05/23: IMPRESSIONS     1. Left ventricular ejection fraction, by estimation, is 55 to 60%. The  left ventricle has normal function. The left ventricle has no regional  wall motion abnormalities. Left ventricular diastolic parameters are  indeterminate.   2. Right ventricular systolic function is normal. The right ventricular  size is normal.   3. The mitral valve is normal in structure. Trivial mitral valve  regurgitation. No evidence of mitral stenosis.   4. The aortic valve is tricuspid. Aortic valve regurgitation is mild. No  aortic stenosis is present.   5. The inferior vena cava is normal in size with greater than 50%  respiratory variability, suggesting right atrial pressure of 3 mmHg.   Comparison(s): No prior Echocardiogram.   Conclusion(s)/Recommendation(s): Otherwise normal echocardiogram, with  minor abnormalities described in the report.   Patient Profile     38 y.o. female with medical history significant for poorly controlled type 2 diabetes mellitus, hypertension, history of right carotid artery dissection with CVA, and CAD s/p PCI to LCx and D1 in May 2024 who is seen at the request of Dr Antionette Char for evaluation of NSTEMI.  Assessment & Plan    NSTEMI. Troponin up to 11K. Patient seen multiple times in the ED in last month for chest pain with negative troponins. Now with more persistent chest pain and elevated troponin. She is s/p stenting of diagonal branch and LCx in May 2024. Strong concern for restenosis/stent thrombosis in setting of poorly controlled DM and HTN.  Patient now found to be pregnant which complicates treatment options. Now on IV heparin and Ntg and chest pain resolved. Given unstable coronary picture I feel we need to proceed with cardiac cath to define anatomy and treatment options. With pregnancy will shield abdomen as well as possible and limit radiation dose as much as reasonable. Will continue DAPT. Ok to use beta blockers with pregnancy. The procedure and risks were reviewed including but not limited to death, myocardial infarction, stroke, arrythmias, bleeding, transfusion, emergency surgery, dye allergy, or renal dysfunction. The patient voices understanding and is agreeable to proceed. Recommend left radial access (see  prior cath note concerning tortuosity of right brachiocephalic and subclavian arteries. Will update Echo.  DM type 2 poorly controlled. Per primary team HTN. Improved now. Will need to push beta blocker dose as other options for HTN limited.  First trimester pregnancy. Patient very high risk pregnancy. Surprising that there does not appear to have been any cardiac input/counseling concerning her pregnancy.  History of carotid artery dissection/CVA HLD. No good treatment options at this time with pregnancy. Will update lipid panel.  Morbid obesity. Anemia- evaluation per primary team.       For questions or updates, please contact [facility] Please consult [email or web address] for contact info under        Signed, Kristelle Cavallaro [place], MD  12/07/2023, 8:12 AM

## 2023-12-07 NOTE — Progress Notes (Signed)
  Echocardiogram 2D Echocardiogram has been performed.  Delcie Roch 12/07/2023, 2:43 PM

## 2023-12-07 NOTE — ED Notes (Signed)
Provider paged by this RN regarding critical high Troponin

## 2023-12-07 NOTE — Inpatient Diabetes Management (Addendum)
Inpatient Diabetes Program Recommendations  AACE/ADA: New Consensus Statement on Inpatient Glycemic Control (2015)  Target Ranges:  Prepandial:   less than 140 mg/dL      Peak postprandial:   less than 180 mg/dL (1-2 hours)      Critically ill patients:  140 - 180 mg/dL   Lab Results  Component Value Date   GLUCAP 278 (H) 12/07/2023   HGBA1C 7.9 (A) 09/04/2023    Review of Glycemic Control  Latest Reference Range & Units 12/07/23 00:47 12/07/23 03:13 12/07/23 07:13  Glucose-Capillary 70 - 99 mg/dL 191 (H) 478 (H) 295 (H)   Diabetes history: DM 2 Outpatient Diabetes medications: metformin 1000 mg bid Current orders for Inpatient glycemic control:  Novolog 0-9 units q4 hours  A1c 7.9% on 09/04/2023 Newly found pregnancy very early on Will need close OBGYN follow up and insulin dosing for tight glucose control  Inpatient Diabetes Program Recommendations:    -   Increase Novolog correction scale to 0-15 units Q4 -   Start Semglee 15 units Daily -   Start Novolog 4 units tid meal coverage if eating >50% of meals  Will need insulin at time of discharge, pt on high doses of insulin last pregnancy, we need to ensure close follow up prior to discharge.  Spoke with pt over the phone. Pt needs a glucometer at home to be able to check her glucose. I explained to pt that she will need insulin for glucose control. Discussed when she should check her glucose and when to take insulin per pregnancy guidelines. Pt reports she has a doctor in mind for OBGYN. I stressed the importance of close OBGYN follow up for insulin titration within the next 1-2 weeks.   Thanks,  Christena Deem RN, MSN, BC-ADM Inpatient Diabetes Coordinator Team Pager [phone] (8a-5p)

## 2023-12-07 NOTE — Progress Notes (Signed)
PROGRESS NOTE  Joann Meyers ZOX:[id number] DOB: [birth year]   PCP: Georganna Skeans, MD  Patient is from: Home.  DOA: 12/06/2023 LOS: 1  Chief complaints Chief Complaint  Patient presents with   Chest Pain     Brief Narrative / Interim history: 38 year old F with PMH of right carotid artery dissection with CVA, CAD s/p PCI to LCx and D1 in 02/2023, DM-2 and HTN presented to ED with acute onsets chest pain.  Initially started when she woke up in the morning of admission and took some Tylenol and went to work.  Chest pain returned with increased severity and associated dyspnea, nausea and vomiting when she did some light physical labor at work that prompted her to come to ED.  Patient stopped taking her losartan and statin recently since she was trying to conceive.  In ED, vital stable except for mild tachycardia and elevated BP.  EKG sinus tachycardia at 111.  CXR without acute finding.  Troponin 806 and trended up to 883.  hCG elevated to 2746.  Cardiology consulted and recommended full dose aspirin, IV heparin and IV nitroglycerin. OB/GYN (Dr. Donavan Foil) consulted by EDP and had nothing to add from OB/GYN standpoint.  The next day, troponin trended up to 11,000 but patient had no chest pain.  Subjective: Seen and examined earlier this morning before she went for heart catheterization.  No major events overnight of this morning.  No further chest pain since she came to ED. Denies shortness of breath, orthopnea, PND, nausea, vomiting or palpitation.  Objective: Vitals:   12/07/23 1104 12/07/23 1109 12/07/23 1114 12/07/23 1119  BP: (!) 184/98 (!) 177/89 (!) 172/86 (!) 172/90  Pulse: 85 88 91 86  Resp: (!) 28 17 20  (!) 32  Temp:      TempSrc:      SpO2: 97% 97% 97%   Weight:      Height:        Examination:  GENERAL: No apparent distress.  Nontoxic. HEENT: MMM.  Vision and hearing grossly intact.  NECK: Supple.  No apparent JVD.  RESP:  No IWOB.  Fair aeration bilaterally. CVS:   RRR. Heart sounds normal.  ABD/GI/GU: BS+. Abd soft, NTND.  MSK/EXT:  Moves extremities. No apparent deformity. No edema.  SKIN: no apparent skin lesion or wound NEURO: Awake, alert and oriented appropriately.  No apparent focal neuro deficit. PSYCH: Calm. Normal affect.   Procedures:  None  Microbiology summarized: None  Assessment and plan: NSTEMI in patient with history of CAD with prior PCI to LCx and D1 in 02/2023.  Presents with chest pain with associated dyspnea, nausea and vomiting.  Throat opponent trended from 800s to 11,000's.  EKG with sinus tachycardia nonspecific ST/T wave changes.  Currently chest pain-free. -Started on IV heparin but patient with allergies to pork products.  Going for LHC now. -Will discuss with pharmacy if anticoagulation needed after LHC. -Continue nitro drip, aspirin, Plavix and metoprolol. -Follow echocardiogram   Hypertensive urgency: BP elevated to 190s/100s on arrival.  Improved some. -Continue IV nitroglycerin and metoprolol  -Titrate nitro for SBP less than 150.  Avoid hypotension given pregnancy.   NIDDM-2 with hyperglycemia: A1c 7.9%.  Since she is on glipizide and metformin at home Recent Labs  Lab 12/07/23 0047 12/07/23 0313 12/07/23 0713  GLUCAP 446* 300* 278*  -Continue SSI-sensitive while NPO -Hold home medications -Will titrate insulin once she start taking p.o. based on blood glucose -Consult diabetic coordinator.  First trimester pregnancy/early pregnancy: hCG 2746 consistent with  3 to 4 weeks of gestation. -Continue prenatal vitamin -Continue holding statin and ARB -EDP discussed with OB/Gyn, Dr. Donavan Foil but no specific or additional recommendation. -Outpatient follow-up with OB as soon as possible.  Microcytic anemia: H&H stable. Recent Labs    01/12/23 1422 01/16/23 1738 03/04/23 2350 03/06/23 0237 07/01/23 2053 07/01/23 2058 10/22/23 1737 10/22/23 2020 12/06/23 2125 12/07/23 0337  HGB 9.8* 9.6* 9.4* 8.1* 9.2*  8.8* 9.4* 9.5* 10.3* 9.1*  -Continue prenatal with iron -Continue monitoring  Bacteriuria in pregnancy: UA with many bacteria, > 300 protein and glucose urea. -Check urine culture -Add IV ceftriaxone.  Class II obesity Body mass index is 38.78 kg/m. -Encourage lifestyle change to lose weight.          DVT prophylaxis:  On full dose anticoagulation  Code Status: Full code Family Communication: None at bedside Level of care: Telemetry Cardiac Status is: Inpatient Remains inpatient appropriate because: Non-STEMI, uncontrolled hypertension and bacteriuria in pregnancy   Final disposition: Home Consultants:  Cardiology Obstetrics  55 minutes with more than 50% spent in reviewing records, counseling patient/family and coordinating care.   Sch Meds:  Scheduled Meds:  [MAR Hold] aspirin EC  81 mg Oral Daily   [MAR Hold] clopidogrel  75 mg Oral Daily   [MAR Hold] insulin aspart  0-9 Units Subcutaneous Q4H   [MAR Hold] metoprolol tartrate  12.5 mg Oral BID   nitroGLYCERIN       [MAR Hold] prenatal vitamin w/FE, FA  1 tablet Oral Q1200   Continuous Infusions:  sodium chloride     sodium chloride     bivalirudin (ANGIOMAX) 250 mg in sodium chloride 0.9 % 50 mL (5 mg/mL) infusion 1.75 mg/kg/hr (12/07/23 1104)   cefTRIAXone (ROCEPHIN)  IV     heparin Stopped (12/07/23 0921)   nitroGLYCERIN 40 mcg/min (12/07/23 1111)   PRN Meds:.sodium chloride, [MAR Hold] acetaminophen, bivalirudin (ANGIOMAX) 250 mg in sodium chloride 0.9 % 50 mL (5 mg/mL) infusion, bivalirudin, clopidogrel, fentaNYL, iohexol, lidocaine (PF), midazolam, nitroGLYCERIN, [MAR Hold] ondansetron (ZOFRAN) IV, [MAR Hold] oxyCODONE, [MAR Hold] polyethylene glycol, Radial Cocktail/Verapamil only, [MAR Hold] senna  Antimicrobials: Anti-infectives (From admission, onward)    Start     Dose/Rate Route Frequency Ordered Stop   12/07/23 1145  cefTRIAXone (ROCEPHIN) 1 g in sodium chloride 0.9 % 100 mL IVPB        1  g 200 mL/hr over 30 Minutes Intravenous Every 24 hours 12/07/23 1139          I have personally reviewed the following labs and images: CBC: Recent Labs  Lab 12/06/23 2125 12/07/23 0337  WBC 6.7 6.7  HGB 10.3* 9.1*  HCT 30.5* 26.6*  MCV 79.8* 78.9*  PLT 213 219   BMP &GFR Recent Labs  Lab 12/06/23 2125 12/07/23 0337  NA 131* 136  K 4.1 3.6  CL 98 104  CO2 20* 23  GLUCOSE 432* 310*  BUN 16 15  CREATININE 0.97 0.89  CALCIUM 9.3 8.9  MG  --  1.9   Estimated Creatinine Clearance: 93.6 mL/min (by C-G formula based on SCr of 0.89 mg/dL). Liver & Pancreas: Recent Labs  Lab 12/06/23 2125  AST 22  ALT 15  ALKPHOS 59  BILITOT 0.8  PROT 6.9  ALBUMIN 3.6   No results for input(s): "LIPASE", "AMYLASE" in the last 168 hours. No results for input(s): "AMMONIA" in the last 168 hours. Diabetic: No results for input(s): "HGBA1C" in the last 72 hours. Recent Labs  Lab 12/07/23 [phone]  12/07/23 0313 12/07/23 0713  GLUCAP 446* 300* 278*   Cardiac Enzymes: No results for input(s): "CKTOTAL", "CKMB", "CKMBINDEX", "TROPONINI" in the last 168 hours. No results for input(s): "PROBNP" in the last 8760 hours. Coagulation Profile: No results for input(s): "INR", "PROTIME" in the last 168 hours. Thyroid Function Tests: No results for input(s): "TSH", "T4TOTAL", "FREET4", "T3FREE", "THYROIDAB" in the last 72 hours. Lipid Profile: No results for input(s): "CHOL", "HDL", "LDLCALC", "TRIG", "CHOLHDL", "LDLDIRECT" in the last 72 hours. Anemia Panel: No results for input(s): "VITAMINB12", "FOLATE", "FERRITIN", "TIBC", "IRON", "RETICCTPCT" in the last 72 hours. Urine analysis:    Component Value Date/Time   COLORURINE YELLOW 12/07/2023 0900   APPEARANCEUR HAZY (A) 12/07/2023 0900   LABSPEC 1.022 12/07/2023 0900   PHURINE 6.0 12/07/2023 0900   GLUCOSEU >=500 (A) 12/07/2023 0900   HGBUR SMALL (A) 12/07/2023 0900   BILIRUBINUR NEGATIVE 12/07/2023 0900   KETONESUR NEGATIVE  12/07/2023 0900   PROTEINUR >=300 (A) 12/07/2023 0900   UROBILINOGEN 0.2 11/01/2022 1416   NITRITE NEGATIVE 12/07/2023 0900   LEUKOCYTESUR TRACE (A) 12/07/2023 0900   Sepsis Labs: Invalid input(s): "PROCALCITONIN", "LACTICIDVEN"  Microbiology: No results found for this or any previous visit (from the past 240 hours).  Radiology Studies: DG Chest Port 1 View Result Date: 12/06/2023 CLINICAL DATA:  cp EXAM: PORTABLE CHEST 1 VIEW COMPARISON:  Chest x-ray 11/19/2023, CT angio chest 10/22/2023 FINDINGS: The heart and mediastinal contours are within normal limits. Low lung volumes. No focal consolidation. No pulmonary edema. No pleural effusion. No pneumothorax. No acute osseous abnormality. IMPRESSION: Low lung volumes with no active disease. Electronically Signed   By: Tish Frederickson M.D.   On: 12/06/2023 21:49      Luci Bellucci T. Janiel Derhammer [facility]  If 7PM-7AM, please contact night-coverage [email or web address] 12/07/2023, 11:43 AM home.

## 2023-12-08 ENCOUNTER — Encounter: Payer: Self-pay | Admitting: Obstetrics and Gynecology

## 2023-12-08 ENCOUNTER — Telehealth: Payer: Self-pay | Admitting: Physician Assistant

## 2023-12-08 ENCOUNTER — Other Ambulatory Visit (HOSPITAL_COMMUNITY): Payer: Self-pay

## 2023-12-08 DIAGNOSIS — E119 Type 2 diabetes mellitus without complications: Secondary | ICD-10-CM

## 2023-12-08 DIAGNOSIS — I10 Essential (primary) hypertension: Secondary | ICD-10-CM | POA: Diagnosis not present

## 2023-12-08 DIAGNOSIS — I214 Non-ST elevation (NSTEMI) myocardial infarction: Secondary | ICD-10-CM

## 2023-12-08 DIAGNOSIS — I160 Hypertensive urgency: Secondary | ICD-10-CM

## 2023-12-08 DIAGNOSIS — Z3490 Encounter for supervision of normal pregnancy, unspecified, unspecified trimester: Secondary | ICD-10-CM | POA: Diagnosis not present

## 2023-12-08 LAB — BASIC METABOLIC PANEL
Anion gap: 7 (ref 5–15)
BUN: 16 mg/dL (ref 6–20)
CO2: 22 mmol/L (ref 22–32)
Calcium: 8.5 mg/dL — ABNORMAL LOW (ref 8.9–10.3)
Chloride: 107 mmol/L (ref 98–111)
Creatinine, Ser: 0.89 mg/dL (ref 0.44–1.00)
GFR, Estimated: >60 mL/min (ref >60)
Glucose, Bld: 188 mg/dL — ABNORMAL HIGH (ref 70–99)
Potassium: 3.6 mmol/L (ref 3.5–5.1)
Sodium: 136 mmol/L (ref 135–145)

## 2023-12-08 LAB — RENAL FUNCTION PANEL
Albumin: 2.6 g/dL — ABNORMAL LOW (ref 3.5–5.0)
Anion gap: 12 (ref 5–15)
BUN: 15 mg/dL (ref 6–20)
CO2: 21 mmol/L — ABNORMAL LOW (ref 22–32)
Calcium: 9.1 mg/dL (ref 8.9–10.3)
Chloride: 105 mmol/L (ref 98–111)
Creatinine, Ser: 0.88 mg/dL (ref 0.44–1.00)
GFR, Estimated: >60 mL/min (ref >60)
Glucose, Bld: 186 mg/dL — ABNORMAL HIGH (ref 70–99)
Phosphorus: 3.3 mg/dL (ref 2.5–4.6)
Potassium: 3.6 mmol/L (ref 3.5–5.1)
Sodium: 138 mmol/L (ref 135–145)

## 2023-12-08 LAB — LIPID PANEL
Cholesterol: 163 mg/dL (ref 0–200)
HDL: 31 mg/dL — ABNORMAL LOW (ref >40)
LDL Cholesterol: 82 mg/dL (ref 0–99)
Total CHOL/HDL Ratio: 5.3 {ratio}
Triglycerides: 250 mg/dL — ABNORMAL HIGH (ref <150)
VLDL: 50 mg/dL — ABNORMAL HIGH (ref 0–40)

## 2023-12-08 LAB — CBC
HCT: 24.4 % — ABNORMAL LOW (ref 36.0–46.0)
Hemoglobin: 8.4 g/dL — ABNORMAL LOW (ref 12.0–15.0)
MCH: 27.5 pg (ref 26.0–34.0)
MCHC: 34.4 g/dL (ref 30.0–36.0)
MCV: 80 fL (ref 80.0–100.0)
Platelets: 171 10*3/uL (ref 150–400)
RBC: 3.05 MIL/uL — ABNORMAL LOW (ref 3.87–5.11)
RDW: 15.7 % — ABNORMAL HIGH (ref 11.5–15.5)
WBC: 5.5 10*3/uL (ref 4.0–10.5)
nRBC: 0 % (ref 0.0–0.2)

## 2023-12-08 LAB — RETICULOCYTES
Immature Retic Fract: 20.1 % — ABNORMAL HIGH (ref 2.3–15.9)
RBC.: 3.15 MIL/uL — ABNORMAL LOW (ref 3.87–5.11)
Retic Count, Absolute: 136.1 10*3/uL (ref 19.0–186.0)
Retic Ct Pct: 4.3 % — ABNORMAL HIGH (ref 0.4–3.1)

## 2023-12-08 LAB — FERRITIN: Ferritin: 76 ng/mL (ref 11–307)

## 2023-12-08 LAB — IRON AND TIBC
Iron: 48 ug/dL (ref 28–170)
Saturation Ratios: 15 % (ref 10.4–31.8)
TIBC: 321 ug/dL (ref 250–450)
UIBC: 273 ug/dL

## 2023-12-08 LAB — CULTURE, OB URINE

## 2023-12-08 LAB — MAGNESIUM: Magnesium: 1.9 mg/dL (ref 1.7–2.4)

## 2023-12-08 LAB — VITAMIN B12: Vitamin B-12: 336 pg/mL (ref 180–914)

## 2023-12-08 LAB — FOLATE: Folate: 23.6 ng/mL (ref >5.9)

## 2023-12-08 LAB — GLUCOSE, CAPILLARY
Glucose-Capillary: 167 mg/dL — ABNORMAL HIGH (ref 70–99)
Glucose-Capillary: 168 mg/dL — ABNORMAL HIGH (ref 70–99)

## 2023-12-08 MED ORDER — INSULIN PEN NEEDLE 31G X 8 MM MISC
1.0000 | Freq: Four times a day (QID) | 1 refills | Status: AC
  Filled 2023-12-08: qty 200, 33d supply, fill #0

## 2023-12-08 MED ORDER — INSULIN GLARGINE 100 UNIT/ML SOLOSTAR PEN
15.0000 [IU] | PEN_INJECTOR | Freq: Every day | SUBCUTANEOUS | 1 refills | Status: DC
  Filled 2023-12-08: qty 12, 80d supply, fill #0

## 2023-12-08 MED ORDER — CLOPIDOGREL BISULFATE 75 MG PO TABS
75.0000 mg | ORAL_TABLET | Freq: Every day | ORAL | 3 refills | Status: DC
Start: 2023-12-08 — End: 2024-08-14
  Filled 2023-12-08: qty 90, 90d supply, fill #0

## 2023-12-08 MED ORDER — CEFADROXIL 500 MG PO CAPS
1000.0000 mg | ORAL_CAPSULE | Freq: Two times a day (BID) | ORAL | 0 refills | Status: AC
  Filled 2023-12-08: qty 20, 5d supply, fill #0

## 2023-12-08 MED ORDER — INSULIN GLARGINE-YFGN 100 UNIT/ML ~~LOC~~ SOPN
15.0000 [IU] | PEN_INJECTOR | Freq: Every day | SUBCUTANEOUS | 0 refills | Status: DC
  Filled 2023-12-08: qty 15, 100d supply, fill #0

## 2023-12-08 MED ORDER — ACCU-CHEK SOFTCLIX LANCETS MISC
1.0000 | Freq: Three times a day (TID) | 0 refills | Status: AC
  Filled 2023-12-08: qty 100, 30d supply, fill #0

## 2023-12-08 MED ORDER — BLOOD GLUCOSE MONITOR SYSTEM W/DEVICE KIT
1.0000 | PACK | Freq: Three times a day (TID) | 0 refills | Status: AC
  Filled 2023-12-08: qty 1, 30d supply, fill #0

## 2023-12-08 MED ORDER — ACCU-CHEK SOFTCLIX LANCET DEV KIT
1.0000 | PACK | Freq: Three times a day (TID) | 0 refills | Status: AC
  Filled 2023-12-08: qty 1, 30d supply, fill #0

## 2023-12-08 MED ORDER — DEXCOM G7 SENSOR MISC
0 refills | Status: DC
  Filled 2023-12-08: qty 3, 30d supply, fill #0

## 2023-12-08 MED ORDER — BLOOD GLUCOSE TEST VI STRP
1.0000 | ORAL_STRIP | Freq: Three times a day (TID) | 0 refills | Status: AC
Start: 2023-12-08 — End: 2024-01-10
  Filled 2023-12-08: qty 100, 34d supply, fill #0

## 2023-12-08 MED ORDER — COMPLETENATE 29-1 MG PO CHEW
1.0000 | CHEWABLE_TABLET | Freq: Every day | ORAL | 0 refills | Status: DC
  Filled 2023-12-08: qty 90, 90d supply, fill #0

## 2023-12-08 MED ORDER — INSULIN ASPART 100 UNIT/ML FLEXPEN
5.0000 [IU] | PEN_INJECTOR | Freq: Three times a day (TID) | SUBCUTANEOUS | 11 refills | Status: DC
  Filled 2023-12-08: qty 12, 80d supply, fill #0

## 2023-12-08 NOTE — Discharge Summary (Signed)
 Physician Discharge Summary  Joann Meyers RUE:[id number] DOB: January 16, 1986 DOA: 12/06/2023  PCP: Georganna Skeans, MD  Admit date: 12/06/2023 Discharge date: 12/08/23  Admitted From: Home Disposition: Home Recommendations for Outpatient Follow-up:  Outpatient follow-up with PCP and cardiology as below OB/GYN, Dr. Darra Lis to to arrange outpatient follow-up for high risk pregnancy as soon as possible Check glycemic control, CMP and CBC in 1 week Please follow up on the following pending results: None  Home Health: No need identified Equipment/Devices: No need identified  Discharge Condition: Stable CODE STATUS: Full code  Follow-up Information     Tobb, Kardie, DO Follow up on 12/18/2023.   Specialty: Cardiology Why: You will be seeing Robet Leu, PA-C for your post hospital follow up visit. The appointment is at 10:05 AM. Please arrive 15 mins early and bring all of your medications. Contact information: [address] [place] [address] [phone]         Georganna Skeans, MD. Schedule an appointment as soon as possible for a visit in 1 week(s).   Specialty: Family Medicine Contact information: [address] [place] [address] [phone]                 Hospital course 38 year old F with PMH of right carotid artery dissection with CVA, CAD s/p PCI to LCx and D1 in 02/2023, DM-2 and HTN presented to ED with acute onsets chest pain.  Initially started when she woke up in the morning of admission and took some Tylenol and went to work.  Chest pain returned with increased severity and associated dyspnea, nausea and vomiting when she did some light physical labor at work that prompted her to come to ED.  Patient stopped taking her losartan and statin recently since she was trying to conceive.   In ED, vital stable except for mild tachycardia and elevated BP.  EKG sinus tachycardia at 111.  CXR without acute finding.  Troponin 806  and trended up to 883.  hCG elevated to 2746.  Cardiology consulted and recommended full dose aspirin, IV heparin and IV nitroglycerin. OB/GYN (Dr. Donavan Foil) consulted by EDP and had nothing to add from OB/GYN standpoint.   The next day, troponin trended up to 11,000 but patient had no chest pain.  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  Echocardiogram without significant finding other than G1-DD.  On the day of discharge, no further chest pain or symptoms.  Felt well.  Glycemic control improved.  She is cleared for discharge on p.o. Plavix, aspirin and beta-blocker.  Not a candidate for statin or ARB and further trimester pregnancy.  Her LDL was only 82.  In regards to poorly controlled diabetes, glycemic control improved with subcutaneous insulin.  A1c was 7.9%.  She had counseling and education about glycemic control by diabetic coordinator.  She is discharged on Lantus 15 units daily with NovoLog 4 units 3 times daily with meals in addition to home metformin.  Glipizide discontinued.  Provided resources for sensor device and diabetic supplies.  In regards to early pregnancy, discussed with on-call OB/GYN Dr. Darra Lis who will arrange outpatient follow-up as soon as possible for high risk OB.  See individual problem list below for more.   Problems addressed during this hospitalization NSTEMI in patient with history of CAD with prior PCI to LCx and D1 in 02/2023.  Presents with chest pain with associated dyspnea,  nausea and vomiting.  Throat opponent trended from 800s to 11,000's.  EKG with sinus tachycardia nonspecific ST/T wave changes.  A1c 7.9%.  LDL 82.  Chest pain and dyspnea resolved. -S/p LHC and PCI of culprit focal restenosis inside the undersized stent of LCx -Discharged on aspirin, Plavix and Toprol-XL -Continue nitro drip, aspirin, Plavix and metoprolol. -Cardiology to  arrange outpatient follow-up.   Hypertensive urgency: BP elevated to 190s/100s on arrival.  Normotensive on low-dose metoprolol -Continue home Toprol-XL 25 mg daily   NIDDM-2 with hyperglycemia: A1c 7.9%.  Since she is on glipizide and metformin at home.  Hyperglycemia improved -Discharge on Lantus 15 units daily and NovoLog 40 units 3 times daily with meals -Continue home metformin -Ordered Dexcom sensor and diabetic supplies. -Counseled and provided with resources.   First trimester pregnancy/early pregnancy: hCG 2746 consistent with 3 to 4 weeks of gestation. -Continue prenatal vitamin -OB/GYN to arrange outpatient follow-up for high risk OB.   Microcytic anemia: H&H stable. -Recheck CBC at follow-up  Bacteriuria in pregnancy: UA with many bacteria, > 300 protein and glucose urea.  Urine culture with multiple species. -Received 2 doses of IV ceftriaxone and discharged on p.o. cefadroxil for 4 more days   Class II obesity Body mass index is 37.46 kg/m. -Encourage lifestyle change            Time spent 35 minutes  Vital signs Vitals:   12/07/23 2015 12/07/23 2339 12/08/23 0416 12/08/23 1018  BP: 121/68 124/63 116/65 122/61  Pulse: 87 79 79 80  Temp: 98.6 F (37 C) 99.7 F (37.6 C) 99.2 F (37.3 C) 98.5 F (36.9 C)  Resp: 20 20 18 16   Height:      Weight:      SpO2: 98% 97% 95% 97%  TempSrc: Oral Oral Oral Oral  BMI (Calculated):         Discharge exam  GENERAL: No apparent distress.  Nontoxic. HEENT: MMM.  Vision and hearing grossly intact.  NECK: Supple.  No apparent JVD.  RESP:  No IWOB.  Fair aeration bilaterally. CVS:  RRR. Heart sounds normal.  ABD/GI/GU: BS+. Abd soft, NTND.  MSK/EXT:  Moves extremities. No apparent deformity. No edema.  SKIN: no apparent skin lesion or wound NEURO: Awake and alert. Oriented appropriately.  No apparent focal neuro deficit. PSYCH: Calm. Normal affect.  Discharge Instructions Discharge Instructions     AMB  Referral VBCI Care Management   Complete by: As directed    3-[redacted] weeks pregnant- admitted with NSTEMI- Needs close follow up   Expected date of contact: Emergent - 3 Days   Service: Pharmacy   Pharmacy Service For:  Disease Management Medication Adherence     Disease states to manage: Diabetes   Amb Referral to Cardiac Rehabilitation   Complete by: As directed    Diagnosis:  Coronary Stents NSTEMI     After initial evaluation and assessments completed: Virtual Based Care may be provided alone or in conjunction with Phase 2 Cardiac Rehab based on patient barriers.: Yes   Intensive Cardiac Rehabilitation (ICR) [facility] location only OR Traditional Cardiac Rehabilitation (TCR) *If criteria for ICR are not met will enroll in TCR [facility] only): Yes   Diet - low sodium heart healthy   Complete by: As directed    Diet Carb Modified   Complete by: As directed    Discharge instructions   Complete by: As directed    It has been a pleasure taking care of you!  You were hospitalized  due to chest pain/NSTEMI for which you had cardiac procedure to open up the blood vessel in your heart.  We have started you on medications.  Please review your new medication list and the directions on your medications before you take them.  Our obstetricians and cardiologist will arrange outpatient follow-up for your pregnancy and heart issue.  We have also started you on insulin for your diabetes.  Please monitor your blood glucose and keep blood glucose log.  Please see a separate instruction about diet and diabetes.    Take care,   Increase activity slowly   Complete by: As directed       Allergies as of 12/08/2023       Reactions   Morphine And Codeine Anaphylaxis   Shellfish Allergy Anaphylaxis   "Swell up and can't breath"   [place] Allergy] Anaphylaxis   "couldn't breath"   Feraheme [ferumoxytol] Other (See Comments)   Hx of reaction- myalgia legs and lower back during infusion. No reaction to oral iron.    Fish-derived Products    Heparin Other (See Comments)   Religious reason.    Latex Dermatitis   Pork-derived Products    No reaction, religious preference        Medication List     STOP taking these medications    atorvastatin 80 MG tablet Commonly known as: LIPITOR   baclofen 10 MG tablet Commonly known as: LIORESAL   glipiZIDE 5 MG tablet Commonly known as: GLUCOTROL   losartan 25 MG tablet Commonly known as: Cozaar       TAKE these medications    Accu-Chek Guide Test test strip Generic drug: glucose blood Use to checl blood sugar three times daily as instructed   Accu-Chek Guide w/Device Kit Use to montior blood sugar levels every morning, afternoon and evening.   Accu-Chek Softclix Lancet Dev Kit Use to check blood sugar levels every morning, afternoon and evening.   Accu-Chek Softclix Lancets lancets Use 1 lancet every morning, afternoon and evening to check blood sugar levels.   acetaminophen 500 MG tablet Commonly known as: TYLENOL Take 1,000 mg by mouth every 6 (six) hours as needed for moderate pain.   aspirin EC 81 MG tablet Take 1 tablet (81 mg total) by mouth daily. Swallow whole.   cefadroxil 500 MG capsule Commonly known as: DURICEF Take 2 capsules (1,000 mg total) by mouth 2 (two) times daily for 5 days.   clopidogrel 75 MG tablet Commonly known as: Plavix Take 1 tablet (75 mg total) by mouth daily.   Dexcom G7 Sensor Misc Use 1 sensor every 10 days.   EQ Lidocaine Pain Relieving 4 % Generic drug: lidocaine Place 1 patch onto the skin daily.   Insupen Pen Needles 31G X 8 MM Misc Generic drug: Insulin Pen Needle Use as directed   Lantus SoloStar 100 UNIT/ML Solostar Pen Generic drug: insulin glargine Inject 15 Units into the skin daily.   metFORMIN 1000 MG tablet Commonly known as: GLUCOPHAGE Take 1 tablet (1,000 mg total) by mouth 2 (two) times daily at 8 am and 10 pm.   metoprolol succinate 25 MG 24 hr tablet Commonly  known as: TOPROL-XL Take 1 tablet (25 mg total) by mouth daily.   nitroGLYCERIN 0.4 MG SL tablet Commonly known as: NITROSTAT Place 1 tablet (0.4 mg total) under the tongue every 5 (five) minutes as needed for chest pain.   NovoLOG FlexPen 100 UNIT/ML FlexPen Generic drug: insulin aspart Inject 5 Units into the skin  3 (three) times daily with meals.   prenatal vitamin w/FE, FA 29-1 MG Chew chewable tablet Chew 1 tablet by mouth daily at 12 noon.   sertraline 50 MG tablet Commonly known as: Zoloft Take 1 tablet (50 mg total) by mouth daily. What changed: when to take this   triamcinolone cream 0.1 % Commonly known as: KENALOG Apply 1 Application topically 2 (two) times daily. What changed: when to take this        Consultations: Cardiology OB/GYN  Procedures/Studies:   ECHOCARDIOGRAM COMPLETE Result Date: 12/07/2023    ECHOCARDIOGRAM REPORT   Patient Name:   Joann Meyers Date of Exam: 12/07/2023 Medical Rec #:  [id number]       Height:       62.0 in Accession #:    [id number]      Weight:       212.0 lb Date of Birth:  05-01-1986       BSA:          1.960 m Patient Age:    38 years        BP:           122/57 mmHg Patient Gender: F               HR:           78 bpm. Exam Location:  Inpatient Procedure: 2D Echo (Both Spectral and Color Flow Doppler were utilized during            procedure). Indications:    NSTEMI  History:        Patient has prior history of Echocardiogram examinations, most                 recent 03/05/2023. Signs/Symptoms:pregnancy; Risk                 Factors:Diabetes and Hypertension.  Sonographer:    Delcie Roch RDCS Referring Phys: [id number] TIMOTHY S OPYD  Sonographer Comments: Image acquisition challenging due to patient body habitus. IMPRESSIONS  1. Left ventricular ejection fraction, by estimation, is 60 to 65%. The left ventricle has normal function. The left ventricle has no regional wall motion abnormalities. Left ventricular diastolic parameters  are consistent with Grade I diastolic dysfunction (impaired relaxation).  2. Right ventricular systolic function is normal. The right ventricular size is normal. Tricuspid regurgitation signal is inadequate for assessing PA pressure.  3. The mitral valve is normal in structure. Trivial mitral valve regurgitation. No evidence of mitral stenosis.  4. The aortic valve is tricuspid. Aortic valve regurgitation is mild. No aortic stenosis is present.  5. The inferior vena cava is normal in size with greater than 50% respiratory variability, suggesting right atrial pressure of 3 mmHg. FINDINGS  Left Ventricle: Left ventricular ejection fraction, by estimation, is 60 to 65%. The left ventricle has normal function. The left ventricle has no regional wall motion abnormalities. Strain imaging was not performed. The left ventricular internal cavity  size was normal in size. There is no left ventricular hypertrophy. Left ventricular diastolic parameters are consistent with Grade I diastolic dysfunction (impaired relaxation). Right Ventricle: The right ventricular size is normal. Right ventricular systolic function is normal. Tricuspid regurgitation signal is inadequate for assessing PA pressure. The tricuspid regurgitant velocity is 2.66 m/s, and with an assumed right atrial  pressure of 3 mmHg, the estimated right ventricular systolic pressure is 31.3 mmHg. Left Atrium: Left atrial size was normal in size. Right Atrium: Right atrial size was normal in size.  Pericardium: Trivial pericardial effusion is present. Mitral Valve: The mitral valve is normal in structure. Trivial mitral valve regurgitation. No evidence of mitral valve stenosis. Tricuspid Valve: The tricuspid valve is normal in structure. Tricuspid valve regurgitation is trivial. No evidence of tricuspid stenosis. Aortic Valve: The aortic valve is tricuspid. Aortic valve regurgitation is mild. No aortic stenosis is present. Pulmonic Valve: The pulmonic valve was normal  in structure. Pulmonic valve regurgitation is not visualized. No evidence of pulmonic stenosis. Aorta: The aortic root is normal in size and structure. Venous: The inferior vena cava is normal in size with greater than 50% respiratory variability, suggesting right atrial pressure of 3 mmHg. IAS/Shunts: No atrial level shunt detected by color flow Doppler. Additional Comments: 3D imaging was not performed.  LEFT VENTRICLE PLAX 2D LVIDd:         5.20 cm   Diastology LVIDs:         3.50 cm   LV e' medial:    6.31 cm/s LV PW:         1.00 cm   LV E/e' medial:  14.7 LV IVS:        1.00 cm   LV e' lateral:   6.31 cm/s LVOT diam:     2.00 cm   LV E/e' lateral: 14.7 LV SV:         41 LV SV Index:   21 LVOT Area:     3.14 cm  RIGHT VENTRICLE             IVC RV Basal diam:  2.20 cm     IVC diam: 1.70 cm RV S prime:     18.50 cm/s TAPSE (M-mode): 2.6 cm LEFT ATRIUM             Index        RIGHT ATRIUM          Index LA diam:        3.50 cm 1.79 cm/m   RA Area:     9.94 cm LA Vol (A2C):   43.2 ml 22.05 ml/m  RA Volume:   19.90 ml 10.16 ml/m LA Vol (A4C):   42.6 ml 21.74 ml/m LA Biplane Vol: 45.2 ml 23.07 ml/m  AORTIC VALVE LVOT Vmax:   72.40 cm/s LVOT Vmean:  47.400 cm/s LVOT VTI:    0.131 m  AORTA Ao Root diam: 3.10 cm Ao Asc diam:  3.40 cm MITRAL VALVE               TRICUSPID VALVE MV Area (PHT): 5.27 cm    TR Peak grad:   28.3 mmHg MV Decel Time: 144 msec    TR Vmax:        266.00 cm/s MV E velocity: 92.70 cm/s MV A velocity: 98.10 cm/s  SHUNTS MV E/A ratio:  0.94        Systemic VTI:  0.13 m                            Systemic Diam: 2.00 cm Olga Millers MD Electronically signed by Olga Millers MD Signature Date/Time: 12/07/2023/2:48:03 PM    Final    CARDIAC CATHETERIZATION Result Date: 12/07/2023 Images from the original result were not included. Coronary angiography and intervention 12/07/2023: LM: Normal LAD: No significant disease          Patent diag stent with no restenosis Lcx:  30% disease proxiaml and  distal to prior Lcx stent  90% focal restenosis inside undersized stent (culprit)          Fibrofatty neoatherosclerosis noted outside the stent without significant neointimal hyperplasia. RCA: Large, dominant vessel          Prox PDA 70% stenosis LVEDP 17 mmHg Successful percutaneous coronary intervention prox-mid Lcx     IVUS guided PTCA with 3.25 X 20 mm [place]man balloon up to 20 atm     Final MSA 6.8 mm2 Ideally, recommend DAPT with aspirin and Plavix for up to 1 year. Anticipating potential need for Plavix interruption at some point during her pregnancy, recommend uninterrupted DAPT for at least 6 months. It may help that we have not placed any new stent today. Patient was reportedly not taking Plavix recently due to insurance issues.  We bolused 600 mg Plavix during the procedure. She will need medication assistance from case management. We did not use heparin in order to respect patient's religious believes. Bivalirudin IV bolus and infusion was used on the procedure.  Please continue bivalirudin infusion for 4 hours postprocedure. Patient needs extensive education regarding her multiple comorbidities, including CAD, uncontrolled hypertension and diabetes, and the risks it poses to her pregnancy. Strongly recommend close outpatient monitoring in high risk pregnancy clinic. I conveyed procedural findings to patient's sister Gwendolyn Fill and brother Storm Frisk. Elder Negus, MD   DG Chest Port 1 View Result Date: 12/06/2023 CLINICAL DATA:  cp EXAM: PORTABLE CHEST 1 VIEW COMPARISON:  Chest x-ray 11/19/2023, CT angio chest 10/22/2023 FINDINGS: The heart and mediastinal contours are within normal limits. Low lung volumes. No focal consolidation. No pulmonary edema. No pleural effusion. No pneumothorax. No acute osseous abnormality. IMPRESSION: Low lung volumes with no active disease. Electronically Signed   By: Tish Frederickson M.D.   On: 12/06/2023 21:49       The results of significant  diagnostics from this hospitalization (including imaging, microbiology, ancillary and laboratory) are listed below for reference.     Microbiology: Recent Results (from the past 240 hours)  Culture, OB Urine     Status: Abnormal   Collection Time: 12/07/23  9:00 AM   Specimen: OB Clean Catch; Urine  Result Value Ref Range Status   Specimen Description OB CLEAN CATCH  Final   Special Requests NONE  Final   Culture (A)  Final    MULTIPLE SPECIES PRESENT, SUGGEST RECOLLECTION NO GROUP B STREP (S.AGALACTIAE) ISOLATED Performed at [facility], [address] [place], [place] [address]    Report Status 12/08/2023 FINAL  Final     Labs:  CBC: Recent Labs  Lab 12/06/23 2125 12/07/23 0337 12/08/23 0324  WBC 6.7 6.7 5.5  HGB 10.3* 9.1* 8.4*  HCT 30.5* 26.6* 24.4*  MCV 79.8* 78.9* 80.0  PLT 213 219 171   BMP &GFR Recent Labs  Lab 12/06/23 2125 12/07/23 0337 12/08/23 0324 12/08/23 0325  NA 131* 136 136 138  K 4.1 3.6 3.6 3.6  CL 98 104 107 105  CO2 20* 23 22 21*  GLUCOSE 432* 310* 188* 186*  BUN 16 15 16 15   CREATININE 0.97 0.89 0.89 0.88  CALCIUM 9.3 8.9 8.5* 9.1  MG  --  1.9 1.9  --   PHOS  --   --   --  3.3   Estimated Creatinine Clearance: 92.9 mL/min (by C-G formula based on SCr of 0.88 mg/dL). Liver & Pancreas: Recent Labs  Lab 12/06/23 2125 12/08/23 0325  AST 22  --   ALT 15  --  ALKPHOS 59  --   BILITOT 0.8  --   PROT 6.9  --   ALBUMIN 3.6 2.6*   No results for input(s): "LIPASE", "AMYLASE" in the last 168 hours. No results for input(s): "AMMONIA" in the last 168 hours. Diabetic: No results for input(s): "HGBA1C" in the last 72 hours. Recent Labs  Lab 12/07/23 1502 12/07/23 2013 12/07/23 2356 12/08/23 0421 12/08/23 0746  GLUCAP 317* 264* 245* 167* 168*   Cardiac Enzymes: No results for input(s): "CKTOTAL", "CKMB", "CKMBINDEX", "TROPONINI" in the last 168 hours. No results for input(s): "PROBNP" in the last 8760 hours. Coagulation  Profile: No results for input(s): "INR", "PROTIME" in the last 168 hours. Thyroid Function Tests: No results for input(s): "TSH", "T4TOTAL", "FREET4", "T3FREE", "THYROIDAB" in the last 72 hours. Lipid Profile: Recent Labs    12/08/23 0324  CHOL 163  HDL 31*  LDLCALC 82  TRIG 960*  CHOLHDL 5.3   Anemia Panel: Recent Labs    12/08/23 0900  VITAMINB12 336  FOLATE 23.6  FERRITIN 76  TIBC 321  IRON 48  RETICCTPCT 4.3*   Urine analysis:    Component Value Date/Time   COLORURINE YELLOW 12/07/2023 0900   APPEARANCEUR HAZY (A) 12/07/2023 0900   LABSPEC 1.022 12/07/2023 0900   PHURINE 6.0 12/07/2023 0900   GLUCOSEU >=500 (A) 12/07/2023 0900   HGBUR SMALL (A) 12/07/2023 0900   BILIRUBINUR NEGATIVE 12/07/2023 0900   KETONESUR NEGATIVE 12/07/2023 0900   PROTEINUR >=300 (A) 12/07/2023 0900   UROBILINOGEN 0.2 11/01/2022 1416   NITRITE NEGATIVE 12/07/2023 0900   LEUKOCYTESUR TRACE (A) 12/07/2023 0900   Sepsis Labs: Invalid input(s): "PROCALCITONIN", "LACTICIDVEN"   SIGNED:  Almon Hercules, MD  [facility] 12/08/2023, 3:05 PM

## 2023-12-08 NOTE — Progress Notes (Signed)
 Pt declined walking at this time  Pt was educated on stent card, stent location, Antiplatelet and ASA use, wt restrictions, no baths/daily wash-ups, s/s of infection, ex guidelines, s/s to stop exercising, NTG use and calling 911, heart healthy and diabetic diet, risk factors and CRPII. Pt received MI book and materials on exercise, diet, and CRPII. Will refer to [facility].    Faustino Congress MS, ACSM-CEP  12/08/2023 9:39 AM

## 2023-12-08 NOTE — Telephone Encounter (Signed)
 Please call patient for TOC follow up.  She has an appt on 3/4 with Robet Leu, PA-C. Tereso Newcomer, PA-C    12/08/2023 10:21 AM

## 2023-12-08 NOTE — Plan of Care (Signed)
 Problem: Education: Goal: Ability to describe self-care measures that may prevent or decrease complications (Diabetes Survival Skills Education) will improve 12/08/2023 1045 by Sylvan Cheese, RN Outcome: Adequate for Discharge 12/08/2023 1045 by Sylvan Cheese, RN Outcome: Adequate for Discharge Goal: Individualized Educational Video(s) 12/08/2023 1045 by Sylvan Cheese, RN Outcome: Adequate for Discharge 12/08/2023 1045 by Sylvan Cheese, RN Outcome: Adequate for Discharge   Problem: Coping: Goal: Ability to adjust to condition or change in health will improve 12/08/2023 1045 by Sylvan Cheese, RN Outcome: Adequate for Discharge 12/08/2023 1045 by Sylvan Cheese, RN Outcome: Adequate for Discharge   Problem: Fluid Volume: Goal: Ability to maintain a balanced intake and output will improve 12/08/2023 1045 by Sylvan Cheese, RN Outcome: Adequate for Discharge 12/08/2023 1045 by Sylvan Cheese, RN Outcome: Adequate for Discharge   Problem: Health Behavior/Discharge Planning: Goal: Ability to identify and utilize available resources and services will improve 12/08/2023 1045 by Sylvan Cheese, RN Outcome: Adequate for Discharge 12/08/2023 1045 by Sylvan Cheese, RN Outcome: Adequate for Discharge Goal: Ability to manage health-related needs will improve 12/08/2023 1045 by Sylvan Cheese, RN Outcome: Adequate for Discharge 12/08/2023 1045 by Sylvan Cheese, RN Outcome: Adequate for Discharge   Problem: Metabolic: Goal: Ability to maintain appropriate glucose levels will improve 12/08/2023 1045 by Sylvan Cheese, RN Outcome: Adequate for Discharge 12/08/2023 1045 by Sylvan Cheese, RN Outcome: Adequate for Discharge   Problem: Nutritional: Goal: Maintenance of adequate nutrition will improve 12/08/2023 1045 by Sylvan Cheese, RN Outcome: Adequate for Discharge 12/08/2023 1045 by Sylvan Cheese, RN Outcome: Adequate for  Discharge Goal: Progress toward achieving an optimal weight will improve 12/08/2023 1045 by Sylvan Cheese, RN Outcome: Adequate for Discharge 12/08/2023 1045 by Sylvan Cheese, RN Outcome: Adequate for Discharge   Problem: Skin Integrity: Goal: Risk for impaired skin integrity will decrease 12/08/2023 1045 by Sylvan Cheese, RN Outcome: Adequate for Discharge 12/08/2023 1045 by Sylvan Cheese, RN Outcome: Adequate for Discharge   Problem: Tissue Perfusion: Goal: Adequacy of tissue perfusion will improve 12/08/2023 1045 by Sylvan Cheese, RN Outcome: Adequate for Discharge 12/08/2023 1045 by Sylvan Cheese, RN Outcome: Adequate for Discharge   Problem: Education: Goal: Understanding of cardiac disease, CV risk reduction, and recovery process will improve 12/08/2023 1045 by Sylvan Cheese, RN Outcome: Adequate for Discharge 12/08/2023 1045 by Sylvan Cheese, RN Outcome: Adequate for Discharge Goal: Individualized Educational Video(s) 12/08/2023 1045 by Sylvan Cheese, RN Outcome: Adequate for Discharge 12/08/2023 1045 by Sylvan Cheese, RN Outcome: Adequate for Discharge   Problem: Activity: Goal: Ability to tolerate increased activity will improve 12/08/2023 1045 by Sylvan Cheese, RN Outcome: Adequate for Discharge 12/08/2023 1045 by Sylvan Cheese, RN Outcome: Adequate for Discharge   Problem: Cardiac: Goal: Ability to achieve and maintain adequate cardiovascular perfusion will improve 12/08/2023 1045 by Sylvan Cheese, RN Outcome: Adequate for Discharge 12/08/2023 1045 by Sylvan Cheese, RN Outcome: Adequate for Discharge   Problem: Health Behavior/Discharge Planning: Goal: Ability to safely manage health-related needs after discharge will improve 12/08/2023 1045 by Sylvan Cheese, RN Outcome: Adequate for Discharge 12/08/2023 1045 by Sylvan Cheese, RN Outcome: Adequate for Discharge   Problem: Education: Goal:  Understanding of CV disease, CV risk reduction, and recovery process will improve 12/08/2023 1045 by Sylvan Cheese, RN Outcome: Adequate for Discharge 12/08/2023 1045 by Sylvan Cheese, RN Outcome: Adequate for Discharge Goal: Individualized  Educational Video(s) 12/08/2023 1045 by Sylvan Cheese, RN Outcome: Adequate for Discharge 12/08/2023 1045 by Sylvan Cheese, RN Outcome: Adequate for Discharge   Problem: Activity: Goal: Ability to return to baseline activity level will improve 12/08/2023 1045 by Sylvan Cheese, RN Outcome: Adequate for Discharge 12/08/2023 1045 by Sylvan Cheese, RN Outcome: Adequate for Discharge   Problem: Cardiovascular: Goal: Ability to achieve and maintain adequate cardiovascular perfusion will improve 12/08/2023 1045 by Sylvan Cheese, RN Outcome: Adequate for Discharge 12/08/2023 1045 by Sylvan Cheese, RN Outcome: Adequate for Discharge Goal: Vascular access site(s) Level 0-1 will be maintained 12/08/2023 1045 by Sylvan Cheese, RN Outcome: Adequate for Discharge 12/08/2023 1045 by Sylvan Cheese, RN Outcome: Adequate for Discharge   Problem: Health Behavior/Discharge Planning: Goal: Ability to safely manage health-related needs after discharge will improve 12/08/2023 1045 by Sylvan Cheese, RN Outcome: Adequate for Discharge 12/08/2023 1045 by Sylvan Cheese, RN Outcome: Adequate for Discharge   Problem: Education: Goal: Knowledge of General Education information will improve Description: Including pain rating scale, medication(s)/side effects and non-pharmacologic comfort measures 12/08/2023 1045 by Sylvan Cheese, RN Outcome: Adequate for Discharge 12/08/2023 1045 by Sylvan Cheese, RN Outcome: Adequate for Discharge   Problem: Health Behavior/Discharge Planning: Goal: Ability to manage health-related needs will improve 12/08/2023 1045 by Sylvan Cheese, RN Outcome: Adequate for  Discharge 12/08/2023 1045 by Sylvan Cheese, RN Outcome: Adequate for Discharge   Problem: Clinical Measurements: Goal: Ability to maintain clinical measurements within normal limits will improve 12/08/2023 1045 by Sylvan Cheese, RN Outcome: Adequate for Discharge 12/08/2023 1045 by Sylvan Cheese, RN Outcome: Adequate for Discharge Goal: Will remain free from infection 12/08/2023 1045 by Sylvan Cheese, RN Outcome: Adequate for Discharge 12/08/2023 1045 by Sylvan Cheese, RN Outcome: Adequate for Discharge Goal: Diagnostic test results will improve 12/08/2023 1045 by Sylvan Cheese, RN Outcome: Adequate for Discharge 12/08/2023 1045 by Sylvan Cheese, RN Outcome: Adequate for Discharge Goal: Respiratory complications will improve 12/08/2023 1045 by Sylvan Cheese, RN Outcome: Adequate for Discharge 12/08/2023 1045 by Sylvan Cheese, RN Outcome: Adequate for Discharge Goal: Cardiovascular complication will be avoided 12/08/2023 1045 by Sylvan Cheese, RN Outcome: Adequate for Discharge 12/08/2023 1045 by Sylvan Cheese, RN Outcome: Adequate for Discharge   Problem: Activity: Goal: Risk for activity intolerance will decrease 12/08/2023 1045 by Sylvan Cheese, RN Outcome: Adequate for Discharge 12/08/2023 1045 by Sylvan Cheese, RN Outcome: Adequate for Discharge   Problem: Nutrition: Goal: Adequate nutrition will be maintained 12/08/2023 1045 by Sylvan Cheese, RN Outcome: Adequate for Discharge 12/08/2023 1045 by Sylvan Cheese, RN Outcome: Adequate for Discharge   Problem: Coping: Goal: Level of anxiety will decrease 12/08/2023 1045 by Sylvan Cheese, RN Outcome: Adequate for Discharge 12/08/2023 1045 by Sylvan Cheese, RN Outcome: Adequate for Discharge   Problem: Elimination: Goal: Will not experience complications related to bowel motility 12/08/2023 1045 by Sylvan Cheese, RN Outcome: Adequate for  Discharge 12/08/2023 1045 by Sylvan Cheese, RN Outcome: Adequate for Discharge Goal: Will not experience complications related to urinary retention 12/08/2023 1045 by Sylvan Cheese, RN Outcome: Adequate for Discharge 12/08/2023 1045 by Sylvan Cheese, RN Outcome: Adequate for Discharge   Problem: Pain Managment: Goal: General experience of comfort will improve and/or be controlled 12/08/2023 1045 by Sylvan Cheese, RN Outcome: Adequate for Discharge 12/08/2023 1045 by Sylvan Cheese, RN Outcome: Adequate for Discharge  Problem: Safety: Goal: Ability to remain free from injury will improve 12/08/2023 1045 by Sylvan Cheese, RN Outcome: Adequate for Discharge 12/08/2023 1045 by Sylvan Cheese, RN Outcome: Adequate for Discharge   Problem: Skin Integrity: Goal: Risk for impaired skin integrity will decrease 12/08/2023 1045 by Sylvan Cheese, RN Outcome: Adequate for Discharge 12/08/2023 1045 by Sylvan Cheese, RN Outcome: Adequate for Discharge

## 2023-12-08 NOTE — Progress Notes (Signed)
 Patient Name: Joann Meyers Date of Encounter: 12/08/2023 [facility] Cardiologist: Thomasene Ripple, DO   Interval Summary  .    No chest pain or dyspnea overnight.  No bleeding or other problems at cath access site.  Vital Signs .    Vitals:   12/07/23 1648 12/07/23 2015 12/07/23 2339 12/08/23 0416  BP: (!) 141/66 121/68 124/63 116/65  Pulse: 80 87 79 79  Resp: 18 20 20 18   Temp: 98.7 F (37.1 C) 98.6 F (37 C) 99.7 F (37.6 C) 99.2 F (37.3 C)  TempSrc: Oral Oral Oral Oral  SpO2: 100% 98% 97% 95%  Weight: 92.9 kg     Height: 5\' 2"  (1.575 m)       Intake/Output Summary (Last 24 hours) at 12/08/2023 0829 Last data filed at 12/08/2023 0300 Gross per 24 hour  Intake 100 ml  Output --  Net 100 ml      12/07/2023    4:48 PM 12/06/2023    9:22 PM 10/24/2023    7:32 PM  Last 3 Weights  Weight (lbs) 204 lb 12.8 oz 212 lb 201 lb 15.1 oz  Weight (kg) 92.897 kg 96.163 kg 91.6 kg      Telemetry/ECG    NSR - Personally Reviewed  Physical Exam .   GEN: No acute distress.   Neck: No JVD Cardiac: RRR, no murmurs, rubs, or gallops.  Respiratory: Clear to auscultation bilaterally. GI: Soft, nontender, non-distended  MS: No edema  Assessment & Plan .     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    Has preserved left ventricular systolic function and has not had any overt heart failure or arrhythmia.  Will require very close monitoring throughout pregnancy, high risk.  Avoid medications with teratogenic effect, but we are compelled to use dual antiplatelet therapy (Brilinta pregnancy category C) at least  until the third trimester 1 we will have to consider the risk/benefit of potential bleeding.  Okay to use beta-blockers.  LDL cholesterol is not that high, but she has a remarkably low HDL cholesterol for a young woman and mildly elevated triglycerides, both in the setting of poorly controlled diabetes mellitus (most recent hemoglobin A1c 7.9% but throughout the last year has mostly been in the 9-10% range).  Metabolically, best intervention is tighter glycemic control.  Use of statins in the first trimester is associated with a higher rate of pregnancy loss.  Later on, risk of preterm birth and low birthweight.  Although statins are not completely proscribed during pregnancy, since her LDL cholesterol is not particularly high, will not start statin at this time.  Blood pressure control is currently adequate and beta-blockers are the preferred agent.  Anemia is a chronic abnormality, typically borderline microcytic.  Hemoglobin has been in the 7-9.5 range for the last couple of years and she has really had normal hemoglobin levels over the last 10 years.  B12 level was low in 2023, normal iron studies in 2023, both should be should be repeated.  Will need close collaborative monitoring by high risk OB, cardiology Dr. Servando Salina and endocrinologist.  [name] for discharge from cardiology point of view.  Will arrange follow-up.  For questions or updates, please contact [facility] Please consult [email or web address] for contact info under        Signed, Thurmon Fair, MD

## 2023-12-10 ENCOUNTER — Telehealth: Payer: Self-pay

## 2023-12-10 ENCOUNTER — Encounter (HOSPITAL_COMMUNITY): Payer: Self-pay | Admitting: Cardiology

## 2023-12-10 NOTE — Telephone Encounter (Signed)
 Patient identification verified by 2 forms. Joann Flood, RN     Called and spoke to patient  Patient states:  - patient reported she is feeling well at this time.   Patient contacted regarding discharge from [facility] on  December 08, 2023.  Patient understands to follow up with Robet Leu on December 18, 2023 10:05AM at [facility]. Patient understands discharge instructions? YES Patient understands medications and regiment? YES Patient understands to bring all medications to this visit? YES  Ask patient:  Are you enrolled in My Chart : YES   If no ask patient if they would like to enroll.   Patient denies:  - new or worsening symptoms at  this time.                Reviewed ED warning signs/precautions  Patient agrees with plan, no questions at this time

## 2023-12-10 NOTE — Transitions of Care (Post Inpatient/ED Visit) (Signed)
   12/10/2023  Name: Joann Meyers MRN: [id number] DOB: 1986/01/04  Today's TOC FU Call Status: Today's TOC FU Call Status:: Unsuccessful Call (1st Attempt) Unsuccessful Call (1st Attempt) Date: 12/10/23  Attempted to reach the patient regarding the most recent Inpatient/ED visit.  Follow Up Plan: Additional outreach attempts will be made to reach the patient to complete the Transitions of Care (Post Inpatient/ED visit) call.   Signature  Robyne Peers, RN

## 2023-12-11 ENCOUNTER — Other Ambulatory Visit: Payer: Self-pay | Admitting: Pharmacist

## 2023-12-11 ENCOUNTER — Other Ambulatory Visit (HOSPITAL_COMMUNITY): Payer: Self-pay

## 2023-12-11 ENCOUNTER — Telehealth: Payer: Self-pay

## 2023-12-11 ENCOUNTER — Telehealth: Payer: Self-pay | Admitting: Pharmacist

## 2023-12-11 DIAGNOSIS — E119 Type 2 diabetes mellitus without complications: Secondary | ICD-10-CM

## 2023-12-11 MED ORDER — DEXCOM G7 SENSOR MISC: 6 refills | Status: DC

## 2023-12-11 MED ORDER — DEXCOM G7 RECEIVER DEVI: 0 refills | Status: DC

## 2023-12-11 NOTE — Transitions of Care (Post Inpatient/ED Visit) (Signed)
   12/11/2023  Name: Joann Meyers MRN: [id number] DOB: 12/03/1985  Today's TOC FU Call Status: Today's TOC FU Call Status:: Successful TOC FU Call Completed Unsuccessful Call (1st Attempt) Date: 12/10/23 [facility] FU Call Complete Date: 12/11/23 Patient's Name and Date of Birth confirmed.  Transition Care Management Follow-up Telephone Call Date of Discharge: 12/08/23 Discharge Facility: [facility]) Type of Discharge: Inpatient Admission Primary Inpatient Discharge Diagnosis:: NSTEMI How have you been since you were released from the hospital?: Better Any questions or concerns?: Yes Patient Questions/Concerns:: She said she had some chest pain last night and she had some difficulty breathing at that time. She stated that it lasted for a few hours but is gone today. She has not notified cardiology but said she is going to call them today to make them aware. Patient Questions/Concerns Addressed: Notified Provider of Patient Questions/Concerns (noted above)  Items Reviewed: Did you receive and understand the discharge instructions provided?: Yes Medications obtained,verified, and reconciled?: Partial Review Completed Reason for Partial Mediation Review: She said she has all medications except the clopidogrel.  She explained that she has both insulins and a glucoemter. Her blood sugar this morning was 233. She stated that she checks it 3 times daily before meals and is keeping a log of the blood sugars. She said she did not neave any questions about the med regime and did not need to review the med list. She also said she has been on insulin in the past and does not have any questions about administering the insulin.  She is interetested in a CGM Any new allergies since your discharge?: No Dietary orders reviewed?: No Do you have support at home?: Yes People in Home: spouse Name of Support/Comfort Primary Source: her husband  Medications Reviewed Today: Medications Reviewed Today    Medications were not reviewed in this encounter     Home Care and Equipment/Supplies: Were Home Health Services Ordered?: No (She was referred to outpatient cardiac rehab) Any new equipment or medical supplies ordered?: No  Functional Questionnaire: Do you need assistance with bathing/showering or dressing?: No Do you need assistance with meal preparation?: No Do you need assistance with eating?: No Do you have difficulty maintaining continence: No Do you need assistance with getting out of bed/getting out of a chair/moving?: No Do you have difficulty managing or taking your medications?: No  Follow up appointments reviewed: PCP Follow-up appointment confirmed?: Yes Date of PCP follow-up appointment?: 12/17/23 Follow-up Provider: Dr [facility] Follow-up appointment confirmed?: Yes Date of Specialist follow-up appointment?: 12/18/23 Follow-Up Specialty Provider:: cardiology.  She said she has the phone number for OB/GYN and will call them to schedule an appointment Do you need transportation to your follow-up appointment?: No Do you understand care options if your condition(s) worsen?: Yes-patient verbalized understanding    SIGNATURE Robyne Peers, RN

## 2023-12-11 NOTE — Progress Notes (Deleted)
 Cardiology Office Note:  .   Date:  12/11/2023  ID:  Joann Meyers, DOB Mar 13, 1986, MRN [id number] PCP: Georganna Skeans, MD  [facility] Providers Cardiologist:  Thomasene Ripple, DO  History of Present Illness: .   Joann Meyers is a 38 y.o. female with a past medical history of CAD s/p PCI, HTN, history of TIA, obesity, type 2 dm. Patient is followed by Dr. Servando Salina and presents today for a hospital follow up appointment.   Patient was previously admitted in 02/2023 with an NSTEMI. Echocardiogram 03/05/23 showed EF 55-60%, no regional wall motion abnormalities, normal RV function, mild AI. LHC on 03/05/23 showed multivessel coronary artery disease with sequential 90% and 95% D1 lesions and tandom 80% and 60% Lcx stenoses. Treated with successful PCI to the Lcx with DES and successful PCI to proximal D1 lesion with PTCA and DES. She was started on DAPT with ASA and brilinta for 12 months. Was later transitioned to plavix.   Patient was recently admitted from 2/20-2/22/25 for NSTEMI. She had presented complaining of chest pain. She had stopped taking her statin and ARB recently because she was trying to conceive. In the ED, she was found to be pregnant. Also found to have NSTEMI with hsTn [id number]>11,511. Echocardiogram 12/07/23 showed EF 60-65%, no regional WMAs, grade I DD, normal RV function, mild AI. LHC on 12/07/23 showed 90% focal restenosis inside an undersized stent in the Lcx. This was treated with PCI with balloon angioplasty.   Patient requires close monitoring throughout her high risk pregnancy. Recommending using DAPT with aspirin and plavix, at least until the third trimester. OK to use beta-blockers. Her LDL is mildly elevated, HDL low and she has poorly controlled diabetes. Recommended tight glycemic control.   NSTEMI  CAD  - Patient previously had 2 DES placed in 02/2023- one to the Lcx and one to the proximal D1 - More recently admitted from 2/20-2/22 with NSTEMI. Trop peaked at  11,511. LHC showed 90% instent restenosis in the LCX that was treated with balloon angioplasty  - Patient currently early in the first trimester of pregnancy- ***. Recent NSTEMI and history of CAD does make this a high risk pregnancy. Discussed need for close monitoring, close follow up with Dr. Servando Salina and high-risk OB.  - Continue ASA, plavix- at least until the 3rd trimester - - Not on statin therapy due to pregnancy - continue metoprolol succinate 25 mg daily    HTN   HLD  - Lipid panel from 11/2023 showed LDL 82, HDL 31, triglycerides 150, total cholesterol 163 -   Type 2 DM  - On insulin - A1c 7.9 in 08/2023   Pregnancy   ROS: ***  Studies Reviewed: .        *** Risk Assessment/Calculations:   {Does this patient have ATRIAL FIBRILLATION?:[phone]} No BP recorded.  {Refresh Note OR Click here to enter BP  :1}***       Physical Exam:   VS:  LMP 11/05/2023 (Exact Date)    Wt Readings from Last 3 Encounters:  12/07/23 204 lb 12.8 oz (92.9 kg)  10/24/23 201 lb 15.1 oz (91.6 kg)  10/22/23 201 lb 15.1 oz (91.6 kg)    GEN: Well nourished, well developed in no acute distress NECK: No JVD; No carotid bruits CARDIAC: ***RRR, no murmurs, rubs, gallops RESPIRATORY:  Clear to auscultation without rales, wheezing or rhonchi  ABDOMEN: Soft, non-tender, non-distended EXTREMITIES:  No edema; No deformity   ASSESSMENT AND PLAN: .   *** {The  patient has an active order for outpatient cardiac rehabilitation.   Please indicate if the patient is ready to start. Do NOT delete this.  It will auto delete.  Refresh note, then sign.              Click here to document readiness and see contraindications.  :1}  Cardiac Rehabilitation Eligibility Assessment      {Are you ordering a CV Procedure (e.g. stress test, cath, DCCV, TEE, etc)?   Press F2        :[id number]}  Dispo: ***  Signed, Jonita Albee, PA-C

## 2023-12-11 NOTE — Telephone Encounter (Signed)
 Hey friend,   Pt has a T2DM dx with use of basal + bolus insulin. Can we submit a PA for the Dexcom?

## 2023-12-11 NOTE — Telephone Encounter (Signed)
 From [facility] call:  She said she had some chest pain last night and she had some difficulty breathing at that time. She stated that it lasted for a few hours but is gone today. She has not notified cardiology but said she is going to call them today to make them aware.  Her appointment with cardiology is 12/18/2023.  She is interested in a CGM covered by her insurance,

## 2023-12-11 NOTE — Telephone Encounter (Signed)
 Spoke to patient about Dexcom  Will confirm with pharmacy that it is available.  She will be scheduled with our office [facility] via telemedicine to manage her diabetes only. Message sent to [facility] for OB care- in-person visits.  The patient was appreciative of the call and arrangements of care.   Venia Carbon, NP

## 2023-12-12 ENCOUNTER — Telehealth: Payer: Self-pay

## 2023-12-12 ENCOUNTER — Other Ambulatory Visit (HOSPITAL_BASED_OUTPATIENT_CLINIC_OR_DEPARTMENT_OTHER): Payer: Self-pay | Admitting: Pharmacist

## 2023-12-12 ENCOUNTER — Other Ambulatory Visit: Payer: Self-pay

## 2023-12-12 ENCOUNTER — Encounter: Payer: Self-pay | Admitting: Pharmacist

## 2023-12-12 DIAGNOSIS — Z794 Long term (current) use of insulin: Secondary | ICD-10-CM

## 2023-12-12 DIAGNOSIS — Z3201 Encounter for pregnancy test, result positive: Secondary | ICD-10-CM

## 2023-12-12 DIAGNOSIS — Z7984 Long term (current) use of oral hypoglycemic drugs: Secondary | ICD-10-CM

## 2023-12-12 DIAGNOSIS — E119 Type 2 diabetes mellitus without complications: Secondary | ICD-10-CM

## 2023-12-12 MED FILL — Bivalirudin Trifluoroacetate For IV Soln 250 MG (Base Equiv): INTRAVENOUS | Qty: 250 | Status: AC

## 2023-12-12 NOTE — Progress Notes (Signed)
 Pharmacy TOC Diabetes Review  S:  Patient was referred S/p recent hospitalization for diabetes management. Last A1c was 7.9. Last PCP visit was 10/29/2023. She has been established with Dr. Andrey Campanile now since 10/2022.  Call placed to patient to discuss diabetes control and medication management. Patient was unavailable so I left a HIPAA-compliant VM with instructions to return my call.  Briefly, she was admitted 2/20-2/22/25. Has a significant cardiovascular history of carotid artery dissection with CVA, CAD S/p previous PCI in 02/2023. She also has a hx of HTN and T2DM. Underwent left heart cath in hospital that showed 90% restenosis inside the previously placed stent. PCI was successful with no further chest pain at discharge. Of note, she is currently pregnant so cannot be placed on a RAAS agent or statin. Diabetes coordinator saw her in-patient and she was discharged on metformin + basal/bolus insulin.   Current diabetes medications include: Lantus 15u daily, metformin 1000 mg BID, Novolog 5u TID before meals Even though I could not speak to/verify adherence to medications, it looks like these were dispensed per our dispense record on 12/08/23. I had a message sent to me by our [facility] Nurse CM, Robyne Peers for Dexcom rxns. I sent these to patient's pharmacy and have routed a note to our patient assistance advocate for assistance in PA approval.    O:  Lab Results  Component Value Date   HGBA1C 7.9 (A) 09/04/2023   There were no vitals filed for this visit.  Lipid Panel     Component Value Date/Time   CHOL 163 12/08/2023 0324   TRIG 250 (H) 12/08/2023 0324   HDL 31 (L) 12/08/2023 0324   CHOLHDL 5.3 12/08/2023 0324   VLDL 50 (H) 12/08/2023 0324   LDLCALC 82 12/08/2023 0324   LDLDIRECT 99.6 (H) 08/03/2020 1256    Clinical Atherosclerotic Cardiovascular Disease (ASCVD): Yes  The ASCVD Risk score (Arnett DK, et al., 2019) failed to calculate for the following reasons:   The 2019 ASCVD risk  score is only valid for ages 86 to 53   Risk score cannot be calculated because patient has a medical history suggesting prior/existing ASCVD   Patient is participating in a Managed Medicaid Plan: No   A/P: Diabetes longstanding currently uncontrolled based on last A1c in 08/2023. She needs an updated A1c and sees Dr. Andrey Campanile next week. With her CV hx, an SGLT-2i and/or GLP-1 RA with CV benefit data would be ideal. However, she is currently pregnant. Our mainstay of therapy here should be insulin to maintain strict glycemic control. Once CGM is approved, this should help. Unfortunately, I could not speak to the patient today. However, will send this information to her PCP who sees her 12/17/2023. Recommend to have pt follow-up with me ~4 weeks after that visit for assessment of home CBGs and titration of insulin if necessary.  - Recommend to continue with insulin for strict glycemic control in the setting of T2DM and pregnancy. -Next A1c anticipated when she sees her PCP next week. Please refer to pharmacy for continued DM care if appropriate.    Follow-up:  Pharmacist in 1 month. PCP clinic visit 12/17/2023.  Butch Penny, PharmD, Patsy [place], CPP Clinical Pharmacist [facility] [phone]

## 2023-12-12 NOTE — Telephone Encounter (Addendum)
 Called patient regarding prenatal care in our office. Per patient, LMP is 10/31/23. Today she is [redacted]w[redacted]d and EDD 08/06/24. Patient is interested in prenatal care in our office. Patient denies any vaginal bleeding or abdominal pain. Review MAU precautions with patient. Patient has access to MyChart. Will send medications safe to take in pregnancy list to patient. Recommended patient to begin prenatal vitamins. Informed patient the front office will contact patient and get her scheduled for a new OB intake and prenatal visit in our office. Patient verbalized understanding. Additionally, notified patient we will get her scheduled for an [place] to verify dating and viability for patient. Appointment made for 01/08/24 at 10:35 AM. Patient confirmed scheduled appointment.   Called MFM to identify what orders needed per Dr. Rich Number request for MFM consult. MFM front office stated to place MFM referral orders. Dating and viability [place] and MFM consult referral orders placed.   Marcelino Duster, RN  ----- Message from Venora Maples sent at 12/10/2023 10:34 AM EST ----- Regarding: RE: F/u visit needed Will do, thanks for the heads up. Laurell Josephs do you think you could add in the scheduling pool for MFM? They can put the orders in under me.   Admin pool please schedule OB intake/new OB visit, do not schedule with APP, physician only please.  Adding Joann Meyers for [place], I will place her on Red Chart and Cardio/OB lists.  Mateo ----- Message ----- From: Braxton Feathers, DO Sent: 12/08/2023  10:29 AM EST To: Reva Bores, MD; Venora Maples, MD; # Subject: F/u visit needed                               Hello,  This patient was discharged over the weekend after being treated for an NSTEMI.  They said they did a bedside ultrasound and said she is in the first trimester.  Can we get her established for prenatal care downstairs at the med center for initial prenatal visit?  She was seen last year for previous pregnancy.   And can we also get her scheduled for an MFM consult and early ultrasound?  Thank you all.  BNS

## 2023-12-12 NOTE — Telephone Encounter (Signed)
 I tried to reach the patient [phone] to inform her that a prescription for Dexcom supplies were sent to her pharmacy but the recording stated that the call cannot be completed at this time.

## 2023-12-14 NOTE — Progress Notes (Unsigned)
 Pregnancy episode created, front office notified to schedule new OB appt with MD only.

## 2023-12-17 ENCOUNTER — Inpatient Hospital Stay: Payer: Medicaid Other | Admitting: Family Medicine

## 2023-12-18 ENCOUNTER — Telehealth: Payer: Medicaid Other | Admitting: Obstetrics and Gynecology

## 2023-12-18 ENCOUNTER — Ambulatory Visit: Payer: Medicaid Other | Admitting: Cardiology

## 2023-12-18 ENCOUNTER — Telehealth (HOSPITAL_COMMUNITY): Payer: Self-pay

## 2023-12-18 DIAGNOSIS — O24319 Unspecified pre-existing diabetes mellitus in pregnancy, unspecified trimester: Secondary | ICD-10-CM

## 2023-12-18 MED ORDER — INSULIN GLARGINE 100 UNIT/ML SOLOSTAR PEN: 20.0000 [IU] | PEN_INJECTOR | Freq: Two times a day (BID) | SUBCUTANEOUS | 1 refills | Status: AC

## 2023-12-18 MED ORDER — ASPIRIN 81 MG PO TBEC: 81.0000 mg | DELAYED_RELEASE_TABLET | Freq: Every day | ORAL | 12 refills | Status: DC

## 2023-12-18 MED ORDER — INSULIN ASPART 100 UNIT/ML FLEXPEN: 10.0000 [IU] | PEN_INJECTOR | Freq: Three times a day (TID) | SUBCUTANEOUS | 11 refills | Status: AC

## 2023-12-18 MED ORDER — METFORMIN HCL 500 MG PO TABS: 500.0000 mg | ORAL_TABLET | Freq: Two times a day (BID) | ORAL | 1 refills | Status: DC
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
## 2023-12-18 NOTE — Telephone Encounter (Signed)
 Called patient to see if she is interested in the Cardiac Rehab Program. Patient expressed interest. Explained scheduling process and went over insurance, patient verbalized understanding. Will contact patient for scheduling once f/u has been completed.
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
## 2023-12-19 ENCOUNTER — Inpatient Hospital Stay: Admitting: Family Medicine

## 2023-12-19 ENCOUNTER — Other Ambulatory Visit: Payer: Self-pay

## 2023-12-20 ENCOUNTER — Other Ambulatory Visit: Payer: Self-pay

## 2023-12-23 ENCOUNTER — Other Ambulatory Visit: Payer: Self-pay

## 2023-12-23 ENCOUNTER — Encounter (HOSPITAL_COMMUNITY): Payer: Self-pay

## 2023-12-23 ENCOUNTER — Inpatient Hospital Stay (HOSPITAL_COMMUNITY)
Admission: AD | Admit: 2023-12-23 | Discharge: 2023-12-23 | Disposition: A | Discharge status: Home / Self Care | Attending: Obstetrics & Gynecology | Admitting: Obstetrics & Gynecology

## 2023-12-23 ENCOUNTER — Inpatient Hospital Stay (HOSPITAL_COMMUNITY)

## 2023-12-23 DIAGNOSIS — O208 Other hemorrhage in early pregnancy: Secondary | ICD-10-CM | POA: Diagnosis present

## 2023-12-23 DIAGNOSIS — O10919 Unspecified pre-existing hypertension complicating pregnancy, unspecified trimester: Secondary | ICD-10-CM

## 2023-12-23 DIAGNOSIS — O3680X0 Pregnancy with inconclusive fetal viability, not applicable or unspecified: Secondary | ICD-10-CM | POA: Diagnosis not present

## 2023-12-23 DIAGNOSIS — O418X10 Other specified disorders of amniotic fluid and membranes, first trimester, not applicable or unspecified: Secondary | ICD-10-CM

## 2023-12-23 DIAGNOSIS — Z3A01 Less than 8 weeks gestation of pregnancy: Secondary | ICD-10-CM | POA: Insufficient documentation

## 2023-12-23 DIAGNOSIS — Z79899 Other long term (current) drug therapy: Secondary | ICD-10-CM | POA: Diagnosis not present

## 2023-12-23 DIAGNOSIS — R109 Unspecified abdominal pain: Secondary | ICD-10-CM | POA: Insufficient documentation

## 2023-12-23 DIAGNOSIS — O10911 Unspecified pre-existing hypertension complicating pregnancy, first trimester: Secondary | ICD-10-CM | POA: Insufficient documentation

## 2023-12-23 DIAGNOSIS — O26891 Other specified pregnancy related conditions, first trimester: Secondary | ICD-10-CM | POA: Diagnosis not present

## 2023-12-23 DIAGNOSIS — O468X1 Other antepartum hemorrhage, first trimester: Secondary | ICD-10-CM

## 2023-12-23 LAB — URINALYSIS, ROUTINE W REFLEX MICROSCOPIC
Bilirubin Urine: NEGATIVE
Glucose, UA: NEGATIVE mg/dL
Ketones, ur: NEGATIVE mg/dL
Nitrite: NEGATIVE
Protein, ur: 100 mg/dL — AB
RBC / HPF: >50 RBC/hpf (ref 0–5)
Specific Gravity, Urine: 1.013 (ref 1.005–1.030)
WBC, UA: >50 WBC/hpf (ref 0–5)
pH: 6 (ref 5.0–8.0)

## 2023-12-23 LAB — CBC
HCT: 27.1 % — ABNORMAL LOW (ref 36.0–46.0)
Hemoglobin: 8.9 g/dL — ABNORMAL LOW (ref 12.0–15.0)
MCH: 26.6 pg (ref 26.0–34.0)
MCHC: 32.8 g/dL (ref 30.0–36.0)
MCV: 80.9 fL (ref 80.0–100.0)
Platelets: 179 10*3/uL (ref 150–400)
RBC: 3.35 MIL/uL — ABNORMAL LOW (ref 3.87–5.11)
RDW: 15.7 % — ABNORMAL HIGH (ref 11.5–15.5)
WBC: 5.3 10*3/uL (ref 4.0–10.5)
nRBC: 0 % (ref 0.0–0.2)

## 2023-12-23 LAB — HCG, QUANTITATIVE, PREGNANCY: hCG, Beta Chain, Quant, S: 2079 m[IU]/mL — ABNORMAL HIGH (ref <5)

## 2023-12-23 MED ORDER — METOPROLOL SUCCINATE ER 25 MG PO TB24: 50.0000 mg | ORAL_TABLET | Freq: Every day | ORAL | 0 refills | Status: DC

## 2023-12-23 NOTE — MAU Note (Signed)
 Joann Meyers is a 38 y.o. at Unknown here in MAU reporting: bright red bleeding when wiping that started 15 minutes ago.  Patient reports lower abdominal cramping that started at 0700. Patient reports she feels shaky.  LMP: 10/31/2023 Onset of complaint: today  Pain score: 10/10 abdominal pain There were no vitals filed for this visit.    Lab orders placed from triage:  ua

## 2023-12-23 NOTE — Discharge Instructions (Signed)
Return to care  If you have heavier bleeding that soaks through more than 2 pads per hour for an hour or more If you bleed so much that you feel like you might pass out or you do pass out If you have significant abdominal pain that is not improved with Tylenol   
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
## 2023-12-24 ENCOUNTER — Telehealth: Payer: Self-pay | Admitting: *Deleted

## 2023-12-24 NOTE — Telephone Encounter (Signed)
No answer or voicemail to leave a message. 

## 2023-12-24 NOTE — Telephone Encounter (Signed)
-----   Message from [facility] sent at 12/21/2023  2:09 PM EST ----- Regarding: DM Please schedule her for DM management on 3/11, please schedule several weekly Telehealth visits.    Thank you!!!  Victorino Dike

## 2023-12-25 ENCOUNTER — Telehealth: Payer: Self-pay | Admitting: *Deleted

## 2023-12-25 NOTE — Telephone Encounter (Signed)
No answer or voicemail to leave a message. 

## 2023-12-25 NOTE — Telephone Encounter (Signed)
-----   Message from [facility] sent at 12/21/2023  2:09 PM EST ----- Regarding: DM Please schedule her for DM management on 3/11, please schedule several weekly Telehealth visits.    Thank you!!!  Victorino Dike

## 2023-12-26 ENCOUNTER — Other Ambulatory Visit: Payer: Medicaid Other

## 2023-12-28 ENCOUNTER — Inpatient Hospital Stay: Admitting: Family Medicine

## 2024-01-02 ENCOUNTER — Ambulatory Visit (INDEPENDENT_AMBULATORY_CARE_PROVIDER_SITE_OTHER): Admitting: Family Medicine

## 2024-01-02 ENCOUNTER — Telehealth

## 2024-01-02 ENCOUNTER — Ambulatory Visit

## 2024-01-02 VITALS — BP 148/68 | HR 76

## 2024-01-02 DIAGNOSIS — Z3A01 Less than 8 weeks gestation of pregnancy: Secondary | ICD-10-CM

## 2024-01-02 DIAGNOSIS — O039 Complete or unspecified spontaneous abortion without complication: Secondary | ICD-10-CM

## 2024-01-02 DIAGNOSIS — O3680X0 Pregnancy with inconclusive fetal viability, not applicable or unspecified: Secondary | ICD-10-CM | POA: Diagnosis not present

## 2024-01-02 DIAGNOSIS — Z3201 Encounter for pregnancy test, result positive: Secondary | ICD-10-CM

## 2024-01-02 DIAGNOSIS — Z3A00 Weeks of gestation of pregnancy not specified: Secondary | ICD-10-CM | POA: Diagnosis not present

## 2024-01-02 NOTE — Progress Notes (Unsigned)
  Amenorrhea with positive UPT PROBLEM  VISIT ENCOUNTER NOTE  Subjective:   Joann Meyers is a 38 y.o. G3P0202  female here [place] follow up  3/9 US showed gestational sac Reports heavy bleeding with clots since that visit. Reports filling pas every 2 hours. Had cramping.   [place] today shows non-progression of pregnancy  Denies abnormal vaginal bleeding, discharge, pelvic pain, problems with intercourse or other gynecologic concerns.    Gynecologic History Patient's last menstrual period was 10/31/2023.  Health Maintenance Due  Topic Date Due   Pneumococcal Vaccine 28-55 Years old (1 of 2 - PCV) Never done   FOOT EXAM  Never done   OPHTHALMOLOGY EXAM  Never done   Diabetic kidney evaluation - Urine ACR  01/15/2023   COVID-19 Vaccine (1 - 2024-25 season) Never done   HEMOGLOBIN A1C  12/05/2023    The following portions of the patient's history were reviewed and updated as appropriate: allergies, current medications, past family history, past medical history, past social history, past surgical history and problem list.  Review of Systems {ros; [id number]}   Objective:  BP (!) 148/68   Pulse 76   LMP 10/31/2023  Gen: well appearing, NAD HEENT: no scleral icterus CV: RR Lung: Normal WOB Ext: warm well perfused  [place] today showed no gestational sac  Assessment and Plan:  1. Miscarriage (Primary) *** - Beta hCG quant (ref lab)    Please refer to After Visit Summary for other counseling recommendations.   No follow-ups on file.  Federico Flake, MD, MPH, ABFM Attending Physician Faculty Practice- [facility]

## 2024-01-02 NOTE — Progress Notes (Unsigned)
 Pt had viability [place] today. Placed in room for provider to review results with her. She reports bleeding with clots since mau visit.  Nancy Fetter

## 2024-01-03 LAB — BETA HCG QUANT (REF LAB): hCG Quant: 12 m[IU]/mL

## 2024-01-04 ENCOUNTER — Encounter: Payer: Self-pay | Admitting: Family Medicine

## 2024-01-10 ENCOUNTER — Ambulatory Visit: Attending: Cardiology | Admitting: Cardiology

## 2024-01-10 ENCOUNTER — Encounter: Payer: Self-pay | Admitting: Cardiology

## 2024-01-10 VITALS — BP 148/98 | HR 69 | Resp 97 | Ht 62.0 in | Wt 206.8 lb

## 2024-01-10 DIAGNOSIS — Z79899 Other long term (current) drug therapy: Secondary | ICD-10-CM

## 2024-01-10 DIAGNOSIS — E119 Type 2 diabetes mellitus without complications: Secondary | ICD-10-CM | POA: Diagnosis not present

## 2024-01-10 DIAGNOSIS — F32A Depression, unspecified: Secondary | ICD-10-CM | POA: Diagnosis not present

## 2024-01-10 DIAGNOSIS — I10 Essential (primary) hypertension: Secondary | ICD-10-CM | POA: Diagnosis not present

## 2024-01-10 MED ORDER — ROSUVASTATIN CALCIUM 20 MG PO TABS: 20.0000 mg | ORAL_TABLET | Freq: Every day | ORAL | 3 refills | Status: DC

## 2024-01-10 MED ORDER — AMLODIPINE BESYLATE 5 MG PO TABS: 5.0000 mg | ORAL_TABLET | Freq: Every day | ORAL | 3 refills | Status: DC
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
## 2024-01-10 NOTE — Patient Instructions (Signed)
 Medication Instructions:  Your physician has recommended you make the following change in your medication:  START: Crestor 20 mg once daily START: Amlodipine 5 mg once daily  Please see Iran Planas our pharmacist.  *If you need a refill on your cardiac medications before your next appointment, please call your pharmacy*   Lab Work: CMET, Mag, CBC, HgbA1c If you have labs (blood work) drawn today and your tests are completely normal, you will receive your results only by: MyChart Message (if you have MyChart) OR A paper copy in the mail If you have any lab test that is abnormal or we need to change your treatment, we will call you to review the results.   Follow-Up: At [facility], you and your health needs are our priority.  As part of our continuing mission to provide you with exceptional heart care, we have created designated Provider Care Teams.  These Care Teams include your primary Cardiologist (physician) and Advanced Practice Providers (APPs -  Physician Assistants and Nurse Practitioners) who all work together to provide you with the care you need, when you need it.  We recommend signing up for the patient portal called "MyChart".  Sign up information is provided on this After Visit Summary.  MyChart is used to connect with patients for Virtual Visits (Telemedicine).  Patients are able to view lab/test results, encounter notes, upcoming appointments, etc.  Non-urgent messages can be sent to your provider as well.   To learn more about what you can do with MyChart, go to [email or web address].    Your next appointment:   16 week(s)  Provider:   Thomasene Ripple, DO     Other Instructions   1st Floor: - Lobby - Registration  - Pharmacy  - Lab - Cafe  2nd Floor: - PV Lab - Diagnostic Testing (echo, CT, nuclear med)  3rd Floor: - Vacant  4th Floor: - TCTS (cardiothoracic surgery) - AFib Clinic - Structural Heart Clinic - Vascular Surgery  - Vascular  Ultrasound  5th Floor: - [facility] Cardiology (general and EP) - Clinical Pharmacy for coumadin, hypertension, lipid, weight-loss medications, and med management appointments    Valet parking services will be available as well.

## 2024-01-11 ENCOUNTER — Other Ambulatory Visit: Payer: Self-pay

## 2024-01-11 ENCOUNTER — Ambulatory Visit: Admitting: Cardiology

## 2024-01-11 LAB — HEMOGLOBIN A1C
Est. average glucose Bld gHb Est-mCnc: 171 mg/dL
Hgb A1c MFr Bld: 7.6 % — ABNORMAL HIGH (ref 4.8–5.6)

## 2024-01-11 LAB — COMPREHENSIVE METABOLIC PANEL WITH GFR
ALT: 12 IU/L (ref 0–32)
AST: 13 IU/L (ref 0–40)
Albumin: 3.8 g/dL — ABNORMAL LOW (ref 3.9–4.9)
Alkaline Phosphatase: 83 IU/L (ref 44–121)
BUN/Creatinine Ratio: 23 (ref 9–23)
BUN: 19 mg/dL (ref 6–20)
Bilirubin Total: 0.3 mg/dL (ref 0.0–1.2)
CO2: 21 mmol/L (ref 20–29)
Calcium: 8.8 mg/dL (ref 8.7–10.2)
Chloride: 104 mmol/L (ref 96–106)
Creatinine, Ser: 0.84 mg/dL (ref 0.57–1.00)
Globulin, Total: 2.3 g/dL (ref 1.5–4.5)
Glucose: 215 mg/dL — ABNORMAL HIGH (ref 70–99)
Potassium: 5.4 mmol/L — ABNORMAL HIGH (ref 3.5–5.2)
Sodium: 138 mmol/L (ref 134–144)
Total Protein: 6.1 g/dL (ref 6.0–8.5)
eGFR: 92 mL/min/{1.73_m2} (ref >59)

## 2024-01-11 LAB — CBC
Hematocrit: 28.1 % — ABNORMAL LOW (ref 34.0–46.6)
Hemoglobin: 8.9 g/dL — ABNORMAL LOW (ref 11.1–15.9)
MCH: 26.6 pg (ref 26.6–33.0)
MCHC: 31.7 g/dL (ref 31.5–35.7)
MCV: 84 fL (ref 79–97)
Platelets: 226 10*3/uL (ref 150–450)
RBC: 3.35 x10E6/uL — ABNORMAL LOW (ref 3.77–5.28)
RDW: 15.9 % — ABNORMAL HIGH (ref 11.7–15.4)
WBC: 5.7 10*3/uL (ref 3.4–10.8)

## 2024-01-11 LAB — MAGNESIUM: Magnesium: 1.9 mg/dL (ref 1.6–2.3)

## 2024-01-16 ENCOUNTER — Telehealth

## 2024-01-17 ENCOUNTER — Encounter: Payer: Self-pay | Admitting: Cardiology

## 2024-01-18 ENCOUNTER — Ambulatory Visit: Admitting: Pharmacist

## 2024-01-18 VITALS — BP 147/82 | HR 70 | Ht 62.0 in | Wt 201.6 lb

## 2024-01-18 DIAGNOSIS — I10 Essential (primary) hypertension: Secondary | ICD-10-CM

## 2024-01-18 DIAGNOSIS — E119 Type 2 diabetes mellitus without complications: Secondary | ICD-10-CM

## 2024-01-18 MED ORDER — ISOSORBIDE MONONITRATE ER 30 MG PO TB24
30.0000 mg | ORAL_TABLET | Freq: Every day | ORAL | 2 refills | Status: DC
Start: 2024-01-18 — End: 2024-02-19

## 2024-01-18 MED ORDER — CARVEDILOL 12.5 MG PO TABS: 12.5000 mg | ORAL_TABLET | Freq: Two times a day (BID) | ORAL | 3 refills | Status: DC

## 2024-01-18 NOTE — Patient Instructions (Signed)
 It was nice meeting you today  We would like your blood pressure to stay less than 130/80  We will change your metoprolol to carvedilol 12.5mg  twice a day  Please continue your amlodipine 5mg  once a day  We will also start a new medication called isosorbide 30mg  once a day for chest pain  I will complete the prior authorization for your glucose sensor  Please let us know if you have any questions and we will see you back in a few weeks  Laural Golden, PharmD, BCACP, CDCES, CPP [address] [place], [place], [address] Phone: [phone], Fax: [phone]
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
## 2024-01-21 ENCOUNTER — Encounter: Admitting: Obstetrics and Gynecology

## 2024-01-23 ENCOUNTER — Other Ambulatory Visit: Payer: Self-pay

## 2024-01-23 DIAGNOSIS — R899 Unspecified abnormal finding in specimens from other organs, systems and tissues: Secondary | ICD-10-CM

## 2024-01-23 NOTE — Progress Notes (Signed)
Orders for repeat labs placed.

## 2024-01-30 ENCOUNTER — Other Ambulatory Visit: Payer: Self-pay

## 2024-02-05 NOTE — Telephone Encounter (Signed)
 Attempted to call patient x2 no answer message states "I'm sorry your call cannot be completed as dialed please try again later."

## 2024-02-17 ENCOUNTER — Other Ambulatory Visit: Payer: Self-pay | Admitting: Cardiology

## 2024-02-17 DIAGNOSIS — I10 Essential (primary) hypertension: Secondary | ICD-10-CM

## 2024-02-22 ENCOUNTER — Ambulatory Visit (INDEPENDENT_AMBULATORY_CARE_PROVIDER_SITE_OTHER): Admitting: Pharmacist

## 2024-02-22 ENCOUNTER — Encounter: Payer: Self-pay | Admitting: Pharmacist

## 2024-02-22 VITALS — BP 136/73 | HR 86 | Ht 62.0 in | Wt 206.0 lb

## 2024-02-22 DIAGNOSIS — I10 Essential (primary) hypertension: Secondary | ICD-10-CM

## 2024-02-22 MED ORDER — BLOOD PRESSURE CUFF MISC: 0 refills | Status: AC

## 2024-02-22 NOTE — Patient Instructions (Addendum)
 It was great to see you today!  Continue carvedilol  to 12.5 mg twice daily  Continue isosorbide  mononitrate 30 mg daily  INCREASE amlodipine  to 10 mg daily (you can take two 5 mg tablets to use up your current supply)  Please bring all your medications and your blood pressure cuff to your appointment with the pharmacist on Wednesday May, 14th at 3:30PM.   Let us  know if you have any questions or concerns before then.
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
## 2024-02-27 ENCOUNTER — Other Ambulatory Visit (HOSPITAL_COMMUNITY): Payer: Self-pay

## 2024-02-27 ENCOUNTER — Ambulatory Visit: Attending: Cardiology | Admitting: Pharmacist

## 2024-02-27 ENCOUNTER — Telehealth: Payer: Self-pay

## 2024-02-27 VITALS — BP 160/80 | HR 83

## 2024-02-27 DIAGNOSIS — I2510 Atherosclerotic heart disease of native coronary artery without angina pectoris: Secondary | ICD-10-CM | POA: Diagnosis present

## 2024-02-27 DIAGNOSIS — I10 Essential (primary) hypertension: Secondary | ICD-10-CM | POA: Insufficient documentation

## 2024-02-27 DIAGNOSIS — E119 Type 2 diabetes mellitus without complications: Secondary | ICD-10-CM

## 2024-02-27 MED ORDER — VALSARTAN 80 MG PO TABS
80.0000 mg | ORAL_TABLET | Freq: Every day | ORAL | 2 refills | Status: DC
  Filled 2024-02-27 – 2024-07-13 (×2): qty 30, 30d supply, fill #0

## 2024-02-27 MED ORDER — AMLODIPINE BESYLATE 10 MG PO TABS: 10.0000 mg | ORAL_TABLET | Freq: Every day | ORAL | 1 refills | Status: AC

## 2024-02-27 NOTE — Telephone Encounter (Signed)
 Pharmacy Patient Advocate Encounter   Received notification from Physician's Office that prior authorization for [facility] G7 is required/requested.   Insurance verification completed.   The patient is insured through [facility] .   Per test claim: PA required; PA submitted to above mentioned insurance via CoverMyMeds Key/confirmation #/EOC [facility] Status is pending

## 2024-02-27 NOTE — Patient Instructions (Addendum)
 It was good seeing you again  We would like your blood pressure to be less than 130/80  Continue your amlodipine  10mg  once a day and carvedilol  12.5mg  twice a day  We will start a new medication called valsartan 80mg  once a day  Please update your lab work in 1-2 weeks  Let me know if you have any questions  Joelene Murrain, PharmD, BCACP, CDCES, CPP [address] [place], [place], [address] Phone: [phone], Fax: [phone]
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
## 2024-02-28 ENCOUNTER — Other Ambulatory Visit (HOSPITAL_COMMUNITY): Payer: Self-pay

## 2024-02-28 MED ORDER — DEXCOM G7 SENSOR MISC
5 refills | Status: DC
  Filled 2024-02-28 – 2024-07-24 (×3): qty 3, 30d supply, fill #0

## 2024-02-28 NOTE — Telephone Encounter (Signed)
 Pharmacy Patient Advocate Encounter  Received notification from [facility] that Prior Authorization for [facility] has been APPROVED from 02/27/24 to 08/29/24. Ran test claim, Copay is $0. This test claim was processed through [facility]- copay amounts may vary at other pharmacies due to pharmacy/plan contracts, or as the patient moves through the different stages of their insurance plan.

## 2024-02-28 NOTE — Telephone Encounter (Signed)
 Attempted to call patient to let her know Dexcom approved. No answer, no machine.

## 2024-02-28 NOTE — Addendum Note (Signed)
 Addended by: Sunny English on: 02/28/2024 01:52 PM   Modules accepted: Orders

## 2024-03-11 ENCOUNTER — Other Ambulatory Visit (HOSPITAL_COMMUNITY): Payer: Self-pay

## 2024-03-28 ENCOUNTER — Ambulatory Visit: Admitting: Pharmacist

## 2024-04-26 ENCOUNTER — Encounter (HOSPITAL_COMMUNITY): Payer: Self-pay | Admitting: *Deleted

## 2024-04-26 ENCOUNTER — Emergency Department (HOSPITAL_COMMUNITY)

## 2024-04-26 ENCOUNTER — Other Ambulatory Visit: Payer: Self-pay

## 2024-04-26 ENCOUNTER — Emergency Department (HOSPITAL_COMMUNITY)
Admission: EM | Admit: 2024-04-26 | Discharge: 2024-04-26 | Disposition: A | Discharge status: Home / Self Care | Attending: Emergency Medicine | Admitting: Emergency Medicine

## 2024-04-26 DIAGNOSIS — R0781 Pleurodynia: Secondary | ICD-10-CM | POA: Diagnosis present

## 2024-04-26 DIAGNOSIS — Z794 Long term (current) use of insulin: Secondary | ICD-10-CM | POA: Insufficient documentation

## 2024-04-26 DIAGNOSIS — E1165 Type 2 diabetes mellitus with hyperglycemia: Secondary | ICD-10-CM | POA: Insufficient documentation

## 2024-04-26 DIAGNOSIS — I10 Essential (primary) hypertension: Secondary | ICD-10-CM | POA: Diagnosis not present

## 2024-04-26 DIAGNOSIS — E041 Nontoxic single thyroid nodule: Secondary | ICD-10-CM | POA: Insufficient documentation

## 2024-04-26 DIAGNOSIS — Z7984 Long term (current) use of oral hypoglycemic drugs: Secondary | ICD-10-CM | POA: Insufficient documentation

## 2024-04-26 DIAGNOSIS — Z79899 Other long term (current) drug therapy: Secondary | ICD-10-CM | POA: Diagnosis not present

## 2024-04-26 DIAGNOSIS — Z7982 Long term (current) use of aspirin: Secondary | ICD-10-CM | POA: Insufficient documentation

## 2024-04-26 DIAGNOSIS — Z7902 Long term (current) use of antithrombotics/antiplatelets: Secondary | ICD-10-CM | POA: Insufficient documentation

## 2024-04-26 DIAGNOSIS — I2510 Atherosclerotic heart disease of native coronary artery without angina pectoris: Secondary | ICD-10-CM | POA: Insufficient documentation

## 2024-04-26 DIAGNOSIS — R0789 Other chest pain: Secondary | ICD-10-CM

## 2024-04-26 DIAGNOSIS — Z91040 Latex allergy status: Secondary | ICD-10-CM | POA: Insufficient documentation

## 2024-04-26 DIAGNOSIS — R739 Hyperglycemia, unspecified: Secondary | ICD-10-CM

## 2024-04-26 LAB — URINALYSIS, W/ REFLEX TO CULTURE (INFECTION SUSPECTED)
Bilirubin Urine: NEGATIVE
Glucose, UA: >=500 mg/dL — AB
Hgb urine dipstick: NEGATIVE
Ketones, ur: NEGATIVE mg/dL
Leukocytes,Ua: NEGATIVE
Nitrite: NEGATIVE
Protein, ur: 100 mg/dL — AB
Specific Gravity, Urine: 1.018 (ref 1.005–1.030)
pH: 6 (ref 5.0–8.0)

## 2024-04-26 LAB — CBC
HCT: 26.5 % — ABNORMAL LOW (ref 36.0–46.0)
Hemoglobin: 9.1 g/dL — ABNORMAL LOW (ref 12.0–15.0)
MCH: 27.2 pg (ref 26.0–34.0)
MCHC: 34.3 g/dL (ref 30.0–36.0)
MCV: 79.3 fL — ABNORMAL LOW (ref 80.0–100.0)
Platelets: 157 K/uL (ref 150–400)
RBC: 3.34 MIL/uL — ABNORMAL LOW (ref 3.87–5.11)
RDW: 16.1 % — ABNORMAL HIGH (ref 11.5–15.5)
WBC: 4.2 K/uL (ref 4.0–10.5)
nRBC: 0 % (ref 0.0–0.2)

## 2024-04-26 LAB — I-STAT VENOUS BLOOD GAS, ED
Acid-base deficit: 3 mmol/L — ABNORMAL HIGH (ref 0.0–2.0)
Bicarbonate: 22.8 mmol/L (ref 20.0–28.0)
Calcium, Ion: 1.25 mmol/L (ref 1.15–1.40)
HCT: 24 % — ABNORMAL LOW (ref 36.0–46.0)
Hemoglobin: 8.2 g/dL — ABNORMAL LOW (ref 12.0–15.0)
O2 Saturation: 97 %
Potassium: 4.1 mmol/L (ref 3.5–5.1)
Sodium: 133 mmol/L — ABNORMAL LOW (ref 135–145)
TCO2: 24 mmol/L (ref 22–32)
pCO2, Ven: 44.2 mmHg (ref 44–60)
pH, Ven: 7.321 (ref 7.25–7.43)
pO2, Ven: 104 mmHg — ABNORMAL HIGH (ref 32–45)

## 2024-04-26 LAB — TROPONIN I (HIGH SENSITIVITY)
Troponin I (High Sensitivity): 17 ng/L (ref <18)
Troponin I (High Sensitivity): 17 ng/L (ref <18)

## 2024-04-26 LAB — BASIC METABOLIC PANEL WITH GFR
Anion gap: 10 (ref 5–15)
BUN: 17 mg/dL (ref 6–20)
CO2: 19 mmol/L — ABNORMAL LOW (ref 22–32)
Calcium: 8.5 mg/dL — ABNORMAL LOW (ref 8.9–10.3)
Chloride: 102 mmol/L (ref 98–111)
Creatinine, Ser: 0.89 mg/dL (ref 0.44–1.00)
GFR, Estimated: >60 mL/min (ref >60)
Glucose, Bld: 659 mg/dL (ref 70–99)
Potassium: 3.9 mmol/L (ref 3.5–5.1)
Sodium: 131 mmol/L — ABNORMAL LOW (ref 135–145)

## 2024-04-26 LAB — CBG MONITORING, ED: Glucose-Capillary: 366 mg/dL — ABNORMAL HIGH (ref 70–99)

## 2024-04-26 LAB — HCG, SERUM, QUALITATIVE: Preg, Serum: NEGATIVE

## 2024-04-26 MED ORDER — INSULIN ASPART 100 UNIT/ML IJ SOLN: 4.0000 [IU] | Freq: Once | INTRAMUSCULAR | Status: DC

## 2024-04-26 MED ORDER — INSULIN ASPART 100 UNIT/ML IJ SOLN
10.0000 [IU] | Freq: Once | INTRAMUSCULAR | Status: AC
Start: 2024-04-26 — End: 2024-04-26
  Administered 2024-04-26: 10 [IU] via INTRAVENOUS

## 2024-04-26 MED ORDER — IOHEXOL 350 MG/ML SOLN
75.0000 mL | Freq: Once | INTRAVENOUS | Status: AC | PRN
  Administered 2024-04-26: 75 mL via INTRAVENOUS

## 2024-04-26 MED ORDER — SODIUM CHLORIDE 0.9 % IV BOLUS
1000.0000 mL | Freq: Once | INTRAVENOUS | Status: AC
  Administered 2024-04-26: 1000 mL via INTRAVENOUS

## 2024-04-26 NOTE — Discharge Instructions (Signed)
 It was a pleasure taking care of you here today  Your cardiac workup was reassuring.  Make sure to follow-up with cardiology  Your blood sugar was elevated.  We have given you insulin  this is come down.  Make sure to take your insulin  at home as you typically do  You do have a thyroid  nodule noted on your scans.  You need follow-up with your primary care provider or endocrinology for this  Make sure to follow-up outpatient, return for any worsening symptoms

## 2024-04-26 NOTE — ED Notes (Signed)
 Received report from previous RN. Pt resting, reports some mild chest discomfort. ABCs intact. Call bell within reach.

## 2024-04-26 NOTE — ED Triage Notes (Signed)
 C/o chest pain into left arm worse with movement or deep breath , onset 4-5 weeks ago , states she had 2 stents in her heart. C/o sob

## 2024-04-26 NOTE — ED Notes (Signed)
 Hooked patient back up to the monitor checked her blood sugar it was 366 notified Paramedic of blood sugar patient is resting with call bell in reach

## 2024-04-26 NOTE — ED Provider Notes (Signed)
 [facility] Provider Note   CSN: [id number] Arrival date & time: 04/26/24  1434     Patient presents with: Chest Pain   Joann Meyers is a 38 y.o. female diabetes, hypertension, NSTEMI, carotid artery dissection, CAD here for evaluation of chest pain.  Patient with pleuritic chest pain as well as chest pain worse with movement over the last 5 weeks.  Symptoms not exertional in nature.  Denies any recent falls or injuries.  No pain or swelling to lower extremities.  No numbness, weakness, back pain, abdominal pain.  Does note she has not had her insulin  for the last 2 days and has not checked her blood sugars.  She states she is typically compliant with her home medicines.  She does relate that she missed her last cardiology appointment.  Symptoms have not necessarily worsened since initially onset 5 weeks ago however has not been able to find childcare to be evaluated.   HPI     Prior to Admission medications   Medication Sig Start Date End Date Taking? Authorizing Provider  acetaminophen  (TYLENOL ) 500 MG tablet Take 1,000 mg by mouth every 6 (six) hours as needed for moderate pain.    [provider]  amLODipine  (NORVASC ) 10 MG tablet Take 1 tablet (10 mg total) by mouth daily. 02/27/24   Tobb, Kardie, DO  aspirin  EC 81 MG tablet Take 1 tablet (81 mg total) by mouth daily. Swallow whole. 12/18/23   Rasch, Delon I, NP  Blood Glucose Monitoring Suppl (BLOOD GLUCOSE MONITOR SYSTEM) w/Device KIT Use to montior blood sugar levels every morning, afternoon and evening. 12/08/23   Gonfa, Taye T, MD  Blood Pressure Monitoring (BLOOD PRESSURE CUFF) MISC Use as directed to monitor blood pressure 02/22/24   Tobb, Kardie, DO  carvedilol  (COREG ) 12.5 MG tablet Take 1 tablet (12.5 mg total) by mouth 2 (two) times daily with a meal. 01/18/24   Tobb, Kardie, DO  clopidogrel  (PLAVIX ) 75 MG tablet Take 1 tablet (75 mg total) by mouth daily. 12/08/23   Kathrin Mignon DASEN, MD  Continuous Glucose Receiver (DEXCOM G7 RECEIVER) DEVI Use to check blood glucose continuously. E11.9 12/11/23   Newlin, Enobong, MD  Continuous Glucose Sensor (DEXCOM G7 SENSOR) MISC Apply one sensor to outer arm every 10 days to check blood sugar as needed 02/28/24   Tobb, Kardie, DO  insulin  aspart (NOVOLOG ) 100 UNIT/ML FlexPen Inject 10 Units into the skin 3 (three) times daily with meals. Please give 30 day supply 12/18/23   Rasch, Delon I, NP  insulin  glargine (LANTUS ) 100 UNIT/ML Solostar Pen Inject 20 Units into the skin 2 (two) times daily. Take 20 units in the AM and 20 units in the PM. Space out by 12 hours. 12/18/23   Rasch, Delon I, NP  Insulin  Pen Needle 31G X 8 MM MISC Use as directed 12/08/23   Gonfa, Taye T, MD  isosorbide  mononitrate (IMDUR ) 30 MG 24 hr tablet TAKE 1 TABLET(30 MG) BY MOUTH DAILY 02/19/24   Tobb, Kardie, DO  lidocaine  (EQ LIDOCAINE  PAIN RELIEVING) 4 % Place 1 patch onto the skin daily.    [provider]  metFORMIN  (GLUCOPHAGE ) 500 MG tablet Take 1 tablet (500 mg total) by mouth 2 (two) times daily with a meal. 12/18/23   Rasch, Delon FERNS, NP  nitroGLYCERIN  (NITROSTAT ) 0.4 MG SL tablet Place 1 tablet (0.4 mg total) under the tongue every 5 (five) minutes as needed for chest pain. 04/09/23  Emelia Josefa HERO, NP  prenatal vitamin w/FE, FA (NATACHEW) 29-1 MG CHEW chewable tablet Chew 1 tablet by mouth daily at 12 noon. 12/08/23   Gonfa, Taye T, MD  rosuvastatin  (CRESTOR ) 20 MG tablet Take 1 tablet (20 mg total) by mouth daily. 01/10/24 04/09/24  Tobb, Kardie, DO  sertraline  (ZOLOFT ) 50 MG tablet Take 1 tablet (50 mg total) by mouth daily. 10/29/23   Tanda Bleacher, MD  triamcinolone  cream (KENALOG ) 0.1 % Apply 1 Application topically 2 (two) times daily. 10/24/23   Blitch, Marval HERO, NP  valsartan  (DIOVAN ) 80 MG tablet Take 1 tablet (80 mg total) by mouth daily. 02/27/24   Tobb, Kardie, DO  insulin  NPH Human (NOVOLIN N) 100 UNIT/ML injection Inject 0.1 mLs (10  Units total) into the skin 2 (two) times daily. Take at breakfast and at hs 10/13/21 10/27/21  Fredirick Glenys RAMAN, MD  metoCLOPramide  (REGLAN ) 10 MG tablet Take 1 tablet (10 mg total) by mouth 4 (four) times daily as needed for nausea or vomiting. 08/04/20 01/14/21  Anyanwu, Ugonna A, MD  prochlorperazine  (COMPAZINE ) 10 MG tablet Take 1 tablet (10 mg total) by mouth 2 (two) times daily as needed for nausea or vomiting. 04/25/19 08/12/19  Tegeler, Lonni PARAS, MD    Allergies: Morphine  and codeine, Shellfish allergy, Tuna [fish allergy], Feraheme  [ferumoxytol ], Fish-derived products, Heparin , Latex, and Pork-derived products    Review of Systems  Constitutional: Negative.   HENT: Negative.    Respiratory: Negative.    Cardiovascular:  Positive for chest pain. Negative for palpitations and leg swelling.  Gastrointestinal: Negative.   Genitourinary: Negative.   Musculoskeletal: Negative.   Skin: Negative.   Neurological: Negative.   All other systems reviewed and are negative.   Updated Vital Signs BP (!) 155/74 (BP Location: Left Arm)   Pulse 79   Temp 98.8 F (37.1 C) (Oral)   Resp 20   Ht 5' 2 (1.575 m)   Wt 93.9 kg   SpO2 100%   BMI 37.86 kg/m   Physical Exam Vitals and nursing note reviewed.  Constitutional:      General: She is not in acute distress.    Appearance: She is well-developed. She is not ill-appearing, toxic-appearing or diaphoretic.  HENT:     Head: Atraumatic.  Eyes:     Pupils: Pupils are equal, round, and reactive to light.  Cardiovascular:     Rate and Rhythm: Normal rate and regular rhythm.     Pulses: Normal pulses.          Radial pulses are 2+ on the right side and 2+ on the left side.       Dorsalis pedis pulses are 2+ on the right side and 2+ on the left side.     Heart sounds: Normal heart sounds.  Pulmonary:     Effort: Pulmonary effort is normal. No respiratory distress.     Breath sounds: Normal breath sounds and air entry.     Comments: Clear  bilaterally, speaks in full sentences without difficulty Chest:     Comments: Tenderness diffuse anterior chest wall.  No crepitus or step-off Abdominal:     General: Bowel sounds are normal. There is no distension.     Palpations: Abdomen is soft.     Tenderness: There is no abdominal tenderness.     Comments: Soft, nontender  Musculoskeletal:        General: Normal range of motion.     Cervical back: Normal range of motion and neck supple.  Comments: No bony tenderness, compartments soft, full range of motion.  Calves nontender posterior bilaterally  Skin:    General: Skin is warm and dry.  Neurological:     General: No focal deficit present.     Mental Status: She is alert and oriented to person, place, and time.     (all labs ordered are listed, but only abnormal results are displayed) Labs Reviewed  BASIC METABOLIC PANEL WITH GFR - Abnormal; Notable for the following components:      Result Value   Sodium 131 (*)    CO2 19 (*)    Glucose, Bld 659 (*)    Calcium  8.5 (*)    All other components within normal limits  CBC - Abnormal; Notable for the following components:   RBC 3.34 (*)    Hemoglobin 9.1 (*)    HCT 26.5 (*)    MCV 79.3 (*)    RDW 16.1 (*)    All other components within normal limits  URINALYSIS, W/ REFLEX TO CULTURE (INFECTION SUSPECTED) - Abnormal; Notable for the following components:   Color, Urine STRAW (*)    Glucose, UA >=500 (*)    Protein, ur 100 (*)    Bacteria, UA RARE (*)    All other components within normal limits  I-STAT VENOUS BLOOD GAS, ED - Abnormal; Notable for the following components:   pO2, Ven 104 (*)    Acid-base deficit 3.0 (*)    Sodium 133 (*)    HCT 24.0 (*)    Hemoglobin 8.2 (*)    All other components within normal limits  CBG MONITORING, ED - Abnormal; Notable for the following components:   Glucose-Capillary 366 (*)    All other components within normal limits  HCG, SERUM, QUALITATIVE  TROPONIN I (HIGH  SENSITIVITY)  TROPONIN I (HIGH SENSITIVITY)    EKG: None  Radiology: CT Angio Chest PE W and/or Wo Contrast Result Date: 04/26/2024 CLINICAL DATA:  Chest pain and shortness of breath. EXAM: CT ANGIOGRAPHY CHEST WITH CONTRAST TECHNIQUE: Multidetector CT imaging of the chest was performed using the standard protocol during bolus administration of intravenous contrast. Multiplanar CT image reconstructions and MIPs were obtained to evaluate the vascular anatomy. RADIATION DOSE REDUCTION: This exam was performed according to the departmental dose-optimization program which includes automated exposure control, adjustment of the mA and/or kV according to patient size and/or use of iterative reconstruction technique. CONTRAST:  75mL OMNIPAQUE  IOHEXOL  350 MG/ML SOLN COMPARISON:  CT scan 10/22/2023 FINDINGS: Cardiovascular: The heart is within normal limits in size and stable. No pericardial effusion. The aorta is normal in caliber. No dissection. The pulmonary arterial tree is fairly well opacified. No filling defects to suggest pulmonary embolism. Mediastinum/Nodes: No mediastinal or hilar mass lymphadenopathy. The esophagus is grossly. There is a persistent and enlarging right thyroid  nodule. This measures 16 x 15 mm and previously measured a maximum of 10 mm on a prior CT scan from 2020. Recommend thyroid  US  (Ref: J Am Coll Radiol. 2015 Feb;12(2): 143-50). Lungs/Pleura: No acute pulmonary findings. Moderate eventration of the right hemidiaphragm with some overlying vascular crowding and streaky atelectasis. No infiltrates, edema or effusions. No pulmonary lesions or nodules. Unremarkable central tracheobronchial tree. Upper Abdomen: No significant upper abdominal findings. Musculoskeletal: No significant bony findings. Review of the MIP images confirms the above findings. IMPRESSION: 1. No CT findings for pulmonary embolism. 2. Normal thoracic aorta. 3. No acute pulmonary findings. 4. Persistent and enlarging  right thyroid  nodule. Recommend thyroid  ultrasound examination. Electronically  Signed   By: MYRTIS Stammer M.D.   On: 04/26/2024 18:59   DG Chest 2 View Result Date: 04/26/2024 CLINICAL DATA:  Chest pain EXAM: CHEST - 2 VIEW COMPARISON:  Chest x-ray 12/06/2023 FINDINGS: The heart size and mediastinal contours are within normal limits. Both lungs are clear. The visualized skeletal structures are unremarkable. IMPRESSION: No active cardiopulmonary disease. Electronically Signed   By: Greig Pique M.D.   On: 04/26/2024 16:17     Procedures   Medications Ordered in the ED  sodium chloride  0.9 % bolus 1,000 mL (1,000 mLs Intravenous New Bag/Given 04/26/24 1637)  insulin  aspart (novoLOG ) injection 10 Units (10 Units Intravenous Given 04/26/24 1646)  iohexol  (OMNIPAQUE ) 350 MG/ML injection 75 mL (75 mLs Intravenous Contrast Given 04/26/24 4489)    38 year old here for evaluation of chest pain over the last 5 weeks.  Worse with movement, palpation and deep breathing.  No recent injury or trauma.  She has extensive medical history including prior carotid artery dissection, CAD requiring stents, diabetes.  She has no complaints of VTE on exam.  Apparently has been out of her insulin  over the last 2 days.  Missed her last cardiology appointment has not been evaluated for this due to childcare issues.  Here she is afebrile, nonseptic, non-ill-appearing.  Will plan on labs, imaging and reassess  Labs and imaging personally viewed and interpreted:  CBC without leukocytosis, hemoglobin 9.1, similar to prior Metabolic panel sodium 131, glucose 659, no anion gap VBG pH 7.3, bicarb normal Pregnancy test negative Troponin 17 X-ray without significant abnormality EKG without ischemic changes  Patient was significant hyperglycemia however no evidence of DKA, HHS.  She will be given IV fluids, fast acting insulin .  Patient reassessed.  I suspect her likely hyperglycemia due to medication noncompliance.  Blood  sugar downtrending to 366. She is still awaiting her CTA of her chest.--Patient states she does have her insulin  which someone has picked up for her that she can take at home.  Patient reassessed.  CTA reassuring does show a thyroid  nodule which patient states she is aware of.  Will have her follow-up with PCP for this.  With regards to her chest pain has been ongoing for 5 weeks.  Does not sound like unstable angina.  Will have her follow-up with cardiology.  Low suspicion for acute ACS, PE, dissection, bacterial infectious process, unstable angina, pneumothorax, fluid overload, traumatic injury, pericardial effusion, endocarditis, pericarditis, myocarditis, referred intra-abdominal pain.  The patient has been appropriately medically screened and/or stabilized in the ED. I have low suspicion for any other emergent medical condition which would require further screening, evaluation or treatment in the ED or require inpatient management.  Patient is hemodynamically stable and in no acute distress.  Patient able to ambulate in department prior to ED.  Evaluation does not show acute pathology that would require ongoing or additional emergent interventions while in the emergency department or further inpatient treatment.  I have discussed the diagnosis with the patient and answered all questions.  Pain is been managed while in the emergency department and patient has no further complaints prior to discharge.  Patient is comfortable with plan discussed in room and is stable for discharge at this time.  I have discussed strict return precautions for returning to the emergency department.  Patient was encouraged to follow-up with PCP/specialist refer to at discharge.  Medical Decision Making Amount and/or Complexity of Data Reviewed External Data Reviewed: labs, radiology, ECG and notes. Labs: ordered. Decision-making details documented in ED Course. Radiology: ordered  and independent interpretation performed. Decision-making details documented in ED Course. ECG/medicine tests: ordered and independent interpretation performed. Decision-making details documented in ED Course.  Risk OTC drugs. Prescription drug management. Parenteral controlled substances. Decision regarding hospitalization. Diagnosis or treatment significantly limited by social determinants of health.        Final diagnoses:  Atypical chest pain  Hyperglycemia  Thyroid  nodule    ED Discharge Orders     None          Casimira Sutphin A, PA-C 04/26/24 1936    Garrick Charleston, MD 04/27/24 2339

## 2024-04-29 ENCOUNTER — Encounter: Payer: Self-pay | Admitting: *Deleted

## 2024-05-01 ENCOUNTER — Ambulatory Visit: Admitting: Cardiology

## 2024-05-26 ENCOUNTER — Other Ambulatory Visit: Payer: Self-pay | Admitting: Cardiology

## 2024-05-26 DIAGNOSIS — I10 Essential (primary) hypertension: Secondary | ICD-10-CM

## 2024-07-14 ENCOUNTER — Other Ambulatory Visit (HOSPITAL_COMMUNITY): Payer: Self-pay

## 2024-07-22 ENCOUNTER — Inpatient Hospital Stay (HOSPITAL_COMMUNITY)
Admission: AD | Admit: 2024-07-22 | Discharge: 2024-07-23 | Disposition: A | Discharge status: Home / Self Care | Attending: Obstetrics and Gynecology | Admitting: Obstetrics and Gynecology

## 2024-07-22 ENCOUNTER — Encounter (HOSPITAL_COMMUNITY): Payer: Self-pay | Admitting: *Deleted

## 2024-07-22 DIAGNOSIS — Z3A01 Less than 8 weeks gestation of pregnancy: Secondary | ICD-10-CM | POA: Diagnosis not present

## 2024-07-22 DIAGNOSIS — O99891 Other specified diseases and conditions complicating pregnancy: Secondary | ICD-10-CM | POA: Diagnosis not present

## 2024-07-22 DIAGNOSIS — M79605 Pain in left leg: Secondary | ICD-10-CM | POA: Diagnosis present

## 2024-07-22 DIAGNOSIS — M5432 Sciatica, left side: Secondary | ICD-10-CM | POA: Diagnosis not present

## 2024-07-22 DIAGNOSIS — R234 Changes in skin texture: Secondary | ICD-10-CM | POA: Diagnosis not present

## 2024-07-22 DIAGNOSIS — O26891 Other specified pregnancy related conditions, first trimester: Secondary | ICD-10-CM | POA: Diagnosis not present

## 2024-07-22 LAB — URINALYSIS, ROUTINE W REFLEX MICROSCOPIC
Bacteria, UA: NONE SEEN
Bilirubin Urine: NEGATIVE
Glucose, UA: >=500 mg/dL — AB
Hgb urine dipstick: NEGATIVE
Ketones, ur: NEGATIVE mg/dL
Leukocytes,Ua: NEGATIVE
Nitrite: NEGATIVE
Protein, ur: 100 mg/dL — AB
Specific Gravity, Urine: 1.024 (ref 1.005–1.030)
pH: 5 (ref 5.0–8.0)

## 2024-07-22 LAB — WET PREP, GENITAL
Clue Cells Wet Prep HPF POC: NONE SEEN
Sperm: NONE SEEN
Trich, Wet Prep: NONE SEEN
WBC, Wet Prep HPF POC: <10 (ref <10)

## 2024-07-22 LAB — POCT PREGNANCY, URINE: Preg Test, Ur: POSITIVE — AB

## 2024-07-22 NOTE — MAU Note (Signed)
 Joann Meyers is a 38 y.o. at [redacted]w[redacted]d here in MAU reporting pain from her L buttocks that runs all the way down her L leg. Also has a red, raised area L forearm that has been present for 2 days. Pt states the area feels hot.  No vag bleeding. Occ abd pain lower abd.  LMP: 06-18-24 Onset of complaint: 10/1 Pain score: 6 for abdomen and 10 for leg Vitals:   07/22/24 2251  BP: (!) 158/75  Pulse: 99  Resp: 17  Temp: 98.8 F (37.1 C)  SpO2: 99%     FHT: na  Lab orders placed from triage: u/a, wet prep and gc/chlam

## 2024-07-23 ENCOUNTER — Other Ambulatory Visit (HOSPITAL_COMMUNITY): Payer: Self-pay

## 2024-07-23 ENCOUNTER — Telehealth: Payer: Self-pay | Admitting: Cardiology

## 2024-07-23 DIAGNOSIS — Z3A01 Less than 8 weeks gestation of pregnancy: Secondary | ICD-10-CM

## 2024-07-23 DIAGNOSIS — M5432 Sciatica, left side: Secondary | ICD-10-CM

## 2024-07-23 DIAGNOSIS — O26891 Other specified pregnancy related conditions, first trimester: Secondary | ICD-10-CM

## 2024-07-23 DIAGNOSIS — R234 Changes in skin texture: Secondary | ICD-10-CM

## 2024-07-23 LAB — GC/CHLAMYDIA PROBE AMP (~~LOC~~) NOT AT ARMC
Chlamydia: NEGATIVE
Comment: NEGATIVE
Comment: NORMAL
Neisseria Gonorrhea: NEGATIVE

## 2024-07-23 MED ORDER — MUPIROCIN 2 % EX OINT: 1.0000 | TOPICAL_OINTMENT | Freq: Two times a day (BID) | CUTANEOUS | 0 refills | Status: DC

## 2024-07-23 MED ORDER — COMPLETENATE 29-1 MG PO CHEW: 1.0000 | CHEWABLE_TABLET | Freq: Every day | ORAL | 0 refills | Status: DC

## 2024-07-23 NOTE — Telephone Encounter (Signed)
 Pt had an ED visit 10/7 and was advised to discontinue her heart medications due to pregnancy. Pt was not sure what medications because she did not have the AVS with her. Please advise.
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
## 2024-07-23 NOTE — Telephone Encounter (Signed)
    Danny Geralds, DO  Physician Obstetrics   MAU Provider Note    Cosign Needed Addendum   Date of Service: 07/23/2024  1:15 AM    History    CSN: [id number]   Arrival date and time: 07/22/24 2218 First Provider Initiated Contact with Patient 07/22/2024 10:54 PM     Chief Complaint  Patient presents with   Leg Pain      HPI Joann Meyers is a 38 y.o. H5E9787 at [redacted]w[redacted]d, 03/25/2025, by Last Menstrual Period, who presents to the Maternity Assessment Unit for LLE pain/numbness, LUE spot, and pregnancy confirmation. LLE pain started 2d ago, begins in her back and goes down her leg to the ankle, does not involve the foot. Worse when standing. Denies foot drop. No numb/tingle in other extremities. LUE has a painful, itchy, red lesion. She reports that it is is warm to the touch and denies any exudate. Denies any preceding lesion or insect bit.    She has taken multiple pregnancy tests at home and the last 1-2 have been positive. LMP 9/3.    Assessment and Plan  MDM Anaiz Qazi is a 38 y.o. H5E9787 at [redacted]w[redacted]d, 03/25/2025, by Last Menstrual Period, who presents to the MAU for LLE pain and LUE lesion. .  Ddx: piriformis syndrome/sciatica, other radiculopathy. LUE: insect bite, abscess, cellulitis. Based on LMP, US  today would be too early to confirm viable pregnancy. Chief complaints today have been addressed. Encouraged patient to f/u OP with OB provider for pregnancy confirmation and prenatal care. Upreg here was positive, so her home meds were adjusted to be safer during first trimester, however I have concerns about her cardiovascular risk with these changes. I encouraged her to contact her cardiologist, Dr. Sheena,  to let her know that she is pregnant and seek her advice. I also sent a staff message to Dr. Sheena myself. PNV sent to pharamcy.    Pt feeling reassured exam and provocative testing. Patient feels she can perform stretches and exercises at home. Topical abx sent for LUE, no  fluctance, systemic symptoms, or spreading erythema to suggest cellulitis or spreading infection.    1. Less than [redacted] weeks gestation of pregnancy (Primary)   2. Sciatica of left side   3. Induration, skin   Results pending at the time of DC: none Dispo: DC home in stable condition with return precautions discussed and included in AVS.   Geralds Danny, DO FMOB Fellow, Faculty Practice [facility], Center for [facility]    Electronically signed by Danny Geralds, DO at 07/23/2024  9:31 AM Electronically signed by Danny Geralds, DO at 07/23/2024  9:32 AM  Reviewed the above information with pt and advised per documentation she should discontinue isosorbide , rosuvastatin  and valsartan .  Pt states understanding but reports since she has discontinued these medications she is now having chest pain.  Advised I will forward to Dr Sheena for her review and possible replacements/treatment for her chest discomfort.  Pt is aware she will be contacted with further instructions after Dr Sheena has reviewed.

## 2024-07-23 NOTE — Telephone Encounter (Signed)
 Pt c/o medication issue:  1. Name of Medication:  Isosorbide  30mg  Rosuvastatin  20 mg Valsartan  80mg   2. How are you currently taking this medication (dosage and times per day)?   3. Are you having a reaction (difficulty breathing--STAT)?   4. What is your medication issue? Patient states she went to the ER last night and she found out she is pregnant and they told her to stop taking the three above medications because it's not good for the baby.

## 2024-07-24 ENCOUNTER — Other Ambulatory Visit (HOSPITAL_COMMUNITY): Payer: Self-pay

## 2024-07-24 ENCOUNTER — Ambulatory Visit: Payer: Self-pay

## 2024-07-25 ENCOUNTER — Encounter: Payer: Self-pay | Admitting: *Deleted

## 2024-07-29 ENCOUNTER — Ambulatory Visit: Attending: Cardiology | Admitting: Cardiology

## 2024-07-29 ENCOUNTER — Other Ambulatory Visit (HOSPITAL_COMMUNITY): Payer: Self-pay

## 2024-07-29 ENCOUNTER — Encounter: Payer: Self-pay | Admitting: Cardiology

## 2024-07-29 VITALS — BP 164/88 | HR 90 | Ht 62.0 in | Wt 207.0 lb

## 2024-07-29 DIAGNOSIS — I2510 Atherosclerotic heart disease of native coronary artery without angina pectoris: Secondary | ICD-10-CM | POA: Diagnosis present

## 2024-07-29 DIAGNOSIS — E119 Type 2 diabetes mellitus without complications: Secondary | ICD-10-CM | POA: Insufficient documentation

## 2024-07-29 DIAGNOSIS — Z794 Long term (current) use of insulin: Secondary | ICD-10-CM | POA: Diagnosis present

## 2024-07-29 DIAGNOSIS — Z3A01 Less than 8 weeks gestation of pregnancy: Secondary | ICD-10-CM | POA: Diagnosis present

## 2024-07-29 DIAGNOSIS — E782 Mixed hyperlipidemia: Secondary | ICD-10-CM | POA: Insufficient documentation

## 2024-07-29 MED ORDER — ISOSORBIDE MONONITRATE ER 60 MG PO TB24
60.0000 mg | ORAL_TABLET | Freq: Every day | ORAL | 3 refills | Status: AC
Start: 2024-07-29 — End: 2024-10-27

## 2024-07-29 MED ORDER — BLOOD PRESSURE MONITOR AUTOMAT DEVI
1.0000 [IU] | Freq: Once | 0 refills | Status: AC
  Filled 2024-07-29: qty 1, 30d supply, fill #0

## 2024-07-29 MED ORDER — CARVEDILOL 25 MG PO TABS
25.0000 mg | ORAL_TABLET | Freq: Two times a day (BID) | ORAL | 3 refills | Status: AC
Start: 2024-07-29 — End: 2024-10-27

## 2024-07-29 NOTE — Patient Instructions (Addendum)
 Medication Instructions:  Your physician has recommended you make the following change in your medication:  INCREASE: Imdur  60 mg once daily INCREASE: Coreg  25 mg twice daily  *If you need a refill on your cardiac medications before your next appointment, please call your pharmacy*   Follow-Up: At [facility], you and your health needs are our priority.  As part of our continuing mission to provide you with exceptional heart care, our providers are all part of one team.  This team includes your primary Cardiologist (physician) and Advanced Practice Providers or APPs (Physician Assistants and Nurse Practitioners) who all work together to provide you with the care you need, when you need it.  Your next appointment:    10/21 at 1:40p  Provider:   Kardie Tobb, DO     HOW TO TAKE YOUR BLOOD PRESSURE: Rest 5 minutes before taking your blood pressure. Don't smoke or drink caffeinated beverages for at least 30 minutes before. Take your blood pressure before (not after) you eat. Sit comfortably with your back supported and both feet on the floor (don't cross your legs). Elevate your arm to heart level on a table or a desk. Use the proper sized cuff. It should fit smoothly and snugly around your bare upper arm. There should be enough room to slip a fingertip under the cuff. The bottom edge of the cuff should be 1 inch above the crease of the elbow. Ideally, take 3 measurements at one sitting and record the average.
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
## 2024-08-04 ENCOUNTER — Other Ambulatory Visit (HOSPITAL_COMMUNITY): Payer: Self-pay

## 2024-08-05 ENCOUNTER — Ambulatory Visit: Admitting: Cardiology

## 2024-08-06 ENCOUNTER — Encounter: Payer: Self-pay | Admitting: *Deleted

## 2024-08-07 ENCOUNTER — Ambulatory Visit: Admitting: Cardiology

## 2024-08-08 ENCOUNTER — Other Ambulatory Visit (HOSPITAL_COMMUNITY): Payer: Self-pay

## 2024-08-12 ENCOUNTER — Encounter: Payer: Self-pay | Admitting: *Deleted

## 2024-08-13 ENCOUNTER — Telehealth: Payer: Self-pay

## 2024-08-14 ENCOUNTER — Ambulatory Visit: Attending: Cardiology | Admitting: Cardiology

## 2024-08-14 VITALS — Ht 62.0 in | Wt 209.7 lb

## 2024-08-14 DIAGNOSIS — E119 Type 2 diabetes mellitus without complications: Secondary | ICD-10-CM | POA: Diagnosis not present

## 2024-08-14 DIAGNOSIS — Z3A01 Less than 8 weeks gestation of pregnancy: Secondary | ICD-10-CM | POA: Diagnosis not present

## 2024-08-14 DIAGNOSIS — Z794 Long term (current) use of insulin: Secondary | ICD-10-CM | POA: Diagnosis present

## 2024-08-14 DIAGNOSIS — I2510 Atherosclerotic heart disease of native coronary artery without angina pectoris: Secondary | ICD-10-CM | POA: Diagnosis not present

## 2024-08-14 DIAGNOSIS — O10919 Unspecified pre-existing hypertension complicating pregnancy, unspecified trimester: Secondary | ICD-10-CM | POA: Insufficient documentation

## 2024-08-14 NOTE — Patient Instructions (Addendum)
 Medication Instructions:  Your physician recommends that you continue on your current medications as directed. Please refer to the Current Medication list given to you today.  Please start Imdur  60 mg daily  *If you need a refill on your cardiac medications before your next appointment, please call your pharmacy*  Follow-Up: At [facility], you and your health needs are our priority.  As part of our continuing mission to provide you with exceptional heart care, our providers are all part of one team.  This team includes your primary Cardiologist (physician) and Advanced Practice Providers or APPs (Physician Assistants and Nurse Practitioners) who all work together to provide you with the care you need, when you need it.  Your next appointment:    11/14 with [place] SQUIBB 12/5 with Kardie Tobb, DO

## 2024-08-14 NOTE — Telephone Encounter (Signed)
 Pt scheduled

## 2024-08-17 ENCOUNTER — Inpatient Hospital Stay (HOSPITAL_COMMUNITY)
Admission: AD | Admit: 2024-08-17 | Discharge: 2024-08-17 | Discharge status: Against Medical Advice | Attending: Obstetrics and Gynecology | Admitting: Obstetrics and Gynecology

## 2024-08-17 ENCOUNTER — Other Ambulatory Visit: Payer: Self-pay

## 2024-08-17 ENCOUNTER — Telehealth: Payer: Self-pay | Admitting: Family Medicine

## 2024-08-17 ENCOUNTER — Inpatient Hospital Stay (HOSPITAL_COMMUNITY)

## 2024-08-17 ENCOUNTER — Encounter: Payer: Self-pay | Admitting: Family Medicine

## 2024-08-17 DIAGNOSIS — E119 Type 2 diabetes mellitus without complications: Secondary | ICD-10-CM | POA: Diagnosis not present

## 2024-08-17 DIAGNOSIS — O034 Incomplete spontaneous abortion without complication: Secondary | ICD-10-CM | POA: Diagnosis not present

## 2024-08-17 DIAGNOSIS — I252 Old myocardial infarction: Secondary | ICD-10-CM | POA: Diagnosis not present

## 2024-08-17 DIAGNOSIS — Z8673 Personal history of transient ischemic attack (TIA), and cerebral infarction without residual deficits: Secondary | ICD-10-CM | POA: Insufficient documentation

## 2024-08-17 DIAGNOSIS — Z5329 Procedure and treatment not carried out because of patient's decision for other reasons: Secondary | ICD-10-CM | POA: Insufficient documentation

## 2024-08-17 DIAGNOSIS — I10 Essential (primary) hypertension: Secondary | ICD-10-CM | POA: Insufficient documentation

## 2024-08-17 DIAGNOSIS — O209 Hemorrhage in early pregnancy, unspecified: Secondary | ICD-10-CM | POA: Diagnosis present

## 2024-08-17 DIAGNOSIS — Z3A08 8 weeks gestation of pregnancy: Secondary | ICD-10-CM

## 2024-08-17 LAB — CBC WITH DIFFERENTIAL/PLATELET
Abs Immature Granulocytes: 0.01 K/uL (ref 0.00–0.07)
Basophils Absolute: 0.1 K/uL (ref 0.0–0.1)
Basophils Relative: 1 %
Eosinophils Absolute: 0.1 K/uL (ref 0.0–0.5)
Eosinophils Relative: 3 %
HCT: 25.9 % — ABNORMAL LOW (ref 36.0–46.0)
Hemoglobin: 8.7 g/dL — ABNORMAL LOW (ref 12.0–15.0)
Immature Granulocytes: 0 %
Lymphocytes Relative: 17 %
Lymphs Abs: 0.8 K/uL (ref 0.7–4.0)
MCH: 27.2 pg (ref 26.0–34.0)
MCHC: 33.6 g/dL (ref 30.0–36.0)
MCV: 80.9 fL (ref 80.0–100.0)
Monocytes Absolute: 0.4 K/uL (ref 0.1–1.0)
Monocytes Relative: 8 %
Neutro Abs: 3.3 K/uL (ref 1.7–7.7)
Neutrophils Relative %: 71 %
Platelets: 201 K/uL (ref 150–400)
RBC: 3.2 MIL/uL — ABNORMAL LOW (ref 3.87–5.11)
RDW: 14.4 % (ref 11.5–15.5)
WBC: 4.7 K/uL (ref 4.0–10.5)
nRBC: 0 % (ref 0.0–0.2)

## 2024-08-17 LAB — HCG, QUANTITATIVE, PREGNANCY: hCG, Beta Chain, Quant, S: 25091 m[IU]/mL — ABNORMAL HIGH (ref <5)

## 2024-08-17 NOTE — Telephone Encounter (Signed)
 Patient returned phone call regarding MAU results.   Verified patient identity using two identifiers. Reviewed US  results showing failed pregnancy and CBC with Hgb 8.7 (anemia). Reviewed that Hgb level limits to two management options: expectant management and D&C. Reviewed risk and benefits of each, all questions answered. Patient would like to proceed with D&C.  Message sent to surgery scheduler to schedule D&C within the next couple of weeks. Patient aware to expect call from office to schedule follow up visit as well as a second call to schedule the D&C.
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
## 2024-08-17 NOTE — MAU Note (Signed)
Not in lobby

## 2024-08-17 NOTE — MAU Note (Signed)
 Joann Meyers is a 38 y.o. at [redacted]w[redacted]d here in MAU reporting: she's having moderate VB that began last night.  Reports last intercourse was 3 days ago.  Also reports she's having abdominal cramping.    LMP: 06/18/2024 Onset of complaint: last night Pain score: 10 Vitals:   08/17/24 1720  BP: (!) 146/67  Pulse: 90  Resp: 18  Temp: 98.3 F (36.8 C)  SpO2: 98%     FHT: NA  Lab orders placed from triage: UA

## 2024-08-18 ENCOUNTER — Inpatient Hospital Stay (HOSPITAL_COMMUNITY)
Admission: AD | Admit: 2024-08-18 | Discharge: 2024-08-18 | Disposition: A | Discharge status: Home / Self Care | Attending: Obstetrics and Gynecology | Admitting: Obstetrics and Gynecology

## 2024-08-18 ENCOUNTER — Encounter (HOSPITAL_COMMUNITY): Payer: Self-pay | Admitting: Obstetrics and Gynecology

## 2024-08-18 DIAGNOSIS — R1030 Lower abdominal pain, unspecified: Secondary | ICD-10-CM | POA: Diagnosis present

## 2024-08-18 DIAGNOSIS — I252 Old myocardial infarction: Secondary | ICD-10-CM | POA: Insufficient documentation

## 2024-08-18 DIAGNOSIS — Z7984 Long term (current) use of oral hypoglycemic drugs: Secondary | ICD-10-CM | POA: Insufficient documentation

## 2024-08-18 DIAGNOSIS — N939 Abnormal uterine and vaginal bleeding, unspecified: Secondary | ICD-10-CM | POA: Insufficient documentation

## 2024-08-18 DIAGNOSIS — Z3A01 Less than 8 weeks gestation of pregnancy: Secondary | ICD-10-CM | POA: Diagnosis not present

## 2024-08-18 DIAGNOSIS — E119 Type 2 diabetes mellitus without complications: Secondary | ICD-10-CM | POA: Insufficient documentation

## 2024-08-18 DIAGNOSIS — Z8673 Personal history of transient ischemic attack (TIA), and cerebral infarction without residual deficits: Secondary | ICD-10-CM | POA: Diagnosis not present

## 2024-08-18 DIAGNOSIS — Z98891 History of uterine scar from previous surgery: Secondary | ICD-10-CM | POA: Diagnosis not present

## 2024-08-18 DIAGNOSIS — Z794 Long term (current) use of insulin: Secondary | ICD-10-CM | POA: Diagnosis not present

## 2024-08-18 DIAGNOSIS — I10 Essential (primary) hypertension: Secondary | ICD-10-CM | POA: Diagnosis not present

## 2024-08-18 DIAGNOSIS — R569 Unspecified convulsions: Secondary | ICD-10-CM | POA: Insufficient documentation

## 2024-08-18 DIAGNOSIS — O039 Complete or unspecified spontaneous abortion without complication: Secondary | ICD-10-CM

## 2024-08-18 LAB — CBC WITH DIFFERENTIAL/PLATELET
Abs Immature Granulocytes: 0.02 K/uL (ref 0.00–0.07)
Basophils Absolute: 0 K/uL (ref 0.0–0.1)
Basophils Relative: 1 %
Eosinophils Absolute: 0.2 K/uL (ref 0.0–0.5)
Eosinophils Relative: 2 %
HCT: 25 % — ABNORMAL LOW (ref 36.0–46.0)
Hemoglobin: 8.5 g/dL — ABNORMAL LOW (ref 12.0–15.0)
Immature Granulocytes: 0 %
Lymphocytes Relative: 12 %
Lymphs Abs: 0.8 K/uL (ref 0.7–4.0)
MCH: 27.3 pg (ref 26.0–34.0)
MCHC: 34 g/dL (ref 30.0–36.0)
MCV: 80.4 fL (ref 80.0–100.0)
Monocytes Absolute: 0.6 K/uL (ref 0.1–1.0)
Monocytes Relative: 9 %
Neutro Abs: 4.8 K/uL (ref 1.7–7.7)
Neutrophils Relative %: 76 %
Platelets: 192 K/uL (ref 150–400)
RBC: 3.11 MIL/uL — ABNORMAL LOW (ref 3.87–5.11)
RDW: 14.7 % (ref 11.5–15.5)
WBC: 6.3 K/uL (ref 4.0–10.5)
nRBC: 0 % (ref 0.0–0.2)

## 2024-08-18 MED ORDER — IBUPROFEN 800 MG PO TABS: 800.0000 mg | ORAL_TABLET | Freq: Three times a day (TID) | ORAL | 3 refills | Status: DC | PRN

## 2024-08-18 MED ORDER — IBUPROFEN 800 MG PO TABS
800.0000 mg | ORAL_TABLET | Freq: Three times a day (TID) | ORAL | 3 refills | Status: DC | PRN
Start: 2024-08-18 — End: 2024-08-18

## 2024-08-18 MED ORDER — ONDANSETRON 4 MG PO TBDP: 4.0000 mg | ORAL_TABLET | Freq: Four times a day (QID) | ORAL | 0 refills | Status: AC | PRN

## 2024-08-18 MED ORDER — ACETAMINOPHEN 500 MG PO TABS
1000.0000 mg | ORAL_TABLET | Freq: Once | ORAL | Status: AC
  Administered 2024-08-18: 1000 mg via ORAL
  Filled 2024-08-18: qty 2

## 2024-08-18 MED ORDER — IBUPROFEN 800 MG PO TABS: 400.0000 mg | ORAL_TABLET | Freq: Once | ORAL | Status: DC

## 2024-08-18 MED ORDER — ACETAMINOPHEN 500 MG PO TABS: 1000.0000 mg | ORAL_TABLET | Freq: Four times a day (QID) | ORAL | 0 refills | Status: AC | PRN

## 2024-08-18 MED ORDER — OXYCODONE HCL 5 MG PO TABS: 5.0000 mg | ORAL_TABLET | ORAL | 0 refills | Status: DC | PRN

## 2024-08-18 MED ORDER — IBUPROFEN 800 MG PO TABS
800.0000 mg | ORAL_TABLET | Freq: Once | ORAL | Status: AC
  Administered 2024-08-18: 800 mg via ORAL
  Filled 2024-08-18: qty 1
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
## 2024-08-18 NOTE — Discharge Instructions (Addendum)
 I am so sorry that this is happening. You may experience many different emotions while you grieve, and it is normal to have emotional stress after a pregnancy loss. Emotional recovery can take longer than physical recovery. There is nothing that you did or did not do to cause this to happen; it is not your fault. Please reach out if you need support, we are here for you. You can also join a group to help work through your grief: [email or web address]  EARLY PREGNANCY LOSS Once the egg is fertilized with the sperm and begins to develop, it attaches to the lining of the uterus. This early pregnancy tissue may not develop into an embryo (the beginning stage of a baby). Sometimes an embryo does develop but does not continue to grow. These problems can be seen on ultrasound.  About 1 in 4 women will have a pregnancy loss in her lifetime.  One in five pregnancies is found to be an early pregnancy failure.    MANAGING EARLY PREGNANCY LOSS The decision as to how to proceed after being diagnosed with and early pregnancy failure is an individual one. There is no right or wrong answer; you can make the choice that works for you. There are several options: 1) EXPECTANT MANAGEMENT: This is where you wait for 2 weeks to see if the body passes the pregnancy on its own You may choose to wait, in which case your own body may complete the passing of the abnormal early pregnancy on its own in about 2-4 weeks Your bleeding may be heavy at times If you decide you would like to have Cytotec please call the office and we will send this to the pharmacy for you.  There is a small possibility that you may need surgery if the bleeding is too much or not all of the pregnancy has passed. 2) SURGERY (D&C): this is a surgical procedure to remove the pregnancy from the  uterus Procedure over in 1 day Requires being put to sleep Bleeding may be light Possible problems during surgery, including injury to womb (uterus)  You have decided to do SURGERY (DILATION&CURETTAGE). The surgery coordinator will contact you to schedule the procedure.  WHAT TO EXPECT AS THE PREGNANCY PASSES: Cramping   Bleeding Headaches   Dizziness To help with these symptoms, prescriptions for three medications. You can take ibuprofen  and Tylenol  for most pain, but use the oxycodone  if needed for severe pain not controlled with ibuprofen  and Tylenol . I would recommend starting a combination that works well is to take three regular strength ibuprofen  (600 mg Total) and then 2 hours later take one Extra strength acetaminophen  (500 mg total). Then as you need to take additional doses, continue to alternate every 2 hours. You should be repeating each medicine about 4 hours after the last dose. You may take the Zofran  for any nausea.   AFTER THE PREGNANCY PASSES: Everybody will feel differently after the early pregnancy passes.  You may have soreness or cramps for a day or two.  You may have soreness or cramps for day or two.   You may have light bleeding for up to 2 weeks.  You may be as active as you feel like being. You should have a follow-up exam with your OBGYN in about 2 weeks to ensure you are healing properly. Your next period is expected to start again about 4-6 weeks afterward. It is possible to get pregnant soon after a loss.  Reasons to return to MAU at [facility]: If you have heavier bleeding that soaks through more that 2 full-size pads per hour for an hour or more If you bleed so much that you feel like you might pass out or you do pass out If you feel lightheaded or dizzy If you have significant abdominal pain that is not improved with Tylenol  1000 mg every 8 hours as needed for pain If you develop a fever > 100.5  If you have  foul-smelling vaginal discharge

## 2024-08-18 NOTE — MAU Note (Signed)
 Joann Meyers is a 38 y.o. at [redacted]w[redacted]d here in MAU reporting:   Arrived by EMS   Pt was here in MAU earlier.Pt states she was told that she was having a miscarriage. Pt reports golf size blood clots coming out and has soaked two pads since she left MAU. Pt states her pain is 10/10 lower abdominal cramping that comes and goes it feels like ctxs.     Vitals:   08/18/24 0227  BP: (!) 146/74  Pulse: 91  Resp: 19  Temp: 98.8 F (37.1 C)  SpO2: 97%

## 2024-08-19 ENCOUNTER — Telehealth: Payer: Self-pay

## 2024-08-19 NOTE — Telephone Encounter (Signed)
 I called patient to schedule surgery. Patient would like procedure scheduled on 08/22/24 at [facility] at 11:30 am. I provided pre-op instructions and surgery details by phone. Patient agreed to arrive at 0930 on 08/22/24.
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
## 2024-08-21 ENCOUNTER — Encounter (HOSPITAL_COMMUNITY): Payer: Self-pay | Admitting: Family Medicine

## 2024-08-21 ENCOUNTER — Ambulatory Visit (INDEPENDENT_AMBULATORY_CARE_PROVIDER_SITE_OTHER): Admitting: Obstetrics and Gynecology

## 2024-08-21 ENCOUNTER — Other Ambulatory Visit: Payer: Self-pay

## 2024-08-21 ENCOUNTER — Encounter: Payer: Self-pay | Admitting: Obstetrics and Gynecology

## 2024-08-21 VITALS — BP 140/75 | HR 89 | Wt 205.7 lb

## 2024-08-21 DIAGNOSIS — Z3A00 Weeks of gestation of pregnancy not specified: Secondary | ICD-10-CM

## 2024-08-21 DIAGNOSIS — O034 Incomplete spontaneous abortion without complication: Secondary | ICD-10-CM

## 2024-08-21 MED ORDER — DOXYCYCLINE HYCLATE 100 MG IV SOLR
200.0000 mg | INTRAVENOUS | Status: AC
  Administered 2024-08-22: 200 mg via INTRAVENOUS
  Filled 2024-08-21: qty 200

## 2024-08-21 NOTE — Progress Notes (Signed)
 SDW call  Patient was given pre-op instructions over the phone. Patient verbalized understanding of instructions provided.     PCP - Dr. Raguel Blush Cardiologist - Dr. Dub Huntsman, LOV 08/14/2024 Pulmonary:    PPM/ICD - denies Device Orders - na Rep Notified - na   Chest x-ray - 04/26/2024 EKG -  04/29/2024 Stress Test - ECHO - 12/07/2023 Cardiac Cath - 12/07/2023  Sleep Study/sleep apnea/CPAP: denies  Type II diabetic. A1C 7.6 on 01/10/2024. States she is out of test strips and has not checked her blood sugar in days Fasting Blood sugar range: 130's when she was checking it How often check sugars: na Metformin  hold DOS Novolog , zero units, 1/2 of correction dose if >220 Lantus , 5 units the night before and the morning of surgery which is 50% of her regular dose   Blood Thinner Instructions: denies Aspirin  Instructions:continue per cardiology   ERAS Protcol - NPO   Anesthesia review: Yes. Seizures, stroke, DM, carotid artery dissection   Patient denies shortness of breath, fever, cough and chest pain over the phone call  Your procedure is scheduled on Friday August 22, 2024  Report to [facility] Main Entrance A at  1030  A.M., then check in with the Admitting office.  Call this number if you have problems the morning of surgery:  [phone]   If you have any questions prior to your surgery date call [phone]: Open Monday-Friday 8am-4pm If you experience any cold or flu symptoms such as cough, fever, chills, shortness of breath, etc. between now and your scheduled surgery, please notify us  at the above number    Remember:  Do not eat or drink after midnight the night before your surgery  Take these medicines the morning of surgery with A SIP OF WATER :  ASA, amlodipine , carvedilol , imdur , zoloft   As needed: Tylenol , zofran   As of today, STOP taking any Aleve , Naproxen , Ibuprofen , Motrin , Advil , Goody's, BC's, all herbal medications, fish oil, and all  vitamins.

## 2024-08-21 NOTE — Progress Notes (Signed)
 Obstetrics and Gynecology Visit Return Patient Evaluation  Appointment Date: 08/21/2024  Primary Care Provider: Tanda Bleacher  OBGYN Clinic: [facility] for [facility]   Chief Complaint: appt made after 11/2 MAU visit, per Dr. Nicholaus  History of Present Illness:  Joann Meyers is a 38 y.o. 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 Given anemia, D&C recommended, which is scheduled for tomorrow  Interval History: Since that time, she states that she had 4 pads of bleeding yesterday and still having some today.  Pt confirms aware of d&c for tomorrow and how to manage her medications  Review of Systems:  as noted in the History of Present Illness.  Patient Active Problem List   Diagnosis Date Noted   Hypertensive urgency 12/07/2023   HTN (hypertension) 03/06/2023   NSTEMI (non-ST elevated myocardial infarction) (HCC) 03/05/2023   Anemia 02/01/2022   BMI 38.0-38.9,adult 11/15/2021   Type 2 diabetes mellitus without complication, without long-term current use of insulin  (HCC) 11/15/2021   Red Chart Rounds Patient 10/20/2021   Rubella non-immune status, antepartum 09/16/2021   History of severe preeclampsia, prior pregnancy, currently pregnant 09/15/2021   Postoperative anemia due to acute blood loss 08/06/2020   Benign gestational thrombocytopenia in second trimester 07/15/2020   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 Current Outpatient Medications  Medication Sig Dispense Refill   acetaminophen  (TYLENOL ) 500 MG tablet Take 2 tablets (1,000 mg total) by mouth every 6 (six) hours as needed for mild pain (pain score 1-3) or moderate pain (pain score 4-6). 30 tablet 0   amLODipine  (NORVASC ) 10 MG tablet Take 1 tablet (10  mg total) by mouth daily. 90 tablet 1   Blood Glucose Monitoring Suppl (BLOOD GLUCOSE MONITOR SYSTEM) w/Device KIT Use to montior blood sugar levels every morning, afternoon and evening. 1 kit 0   carvedilol  (COREG ) 25 MG tablet Take 1 tablet (25 mg total) by mouth 2 (two) times daily. 180 tablet 3   ibuprofen  (ADVIL ) 800 MG tablet Take 1 tablet (800 mg total) by mouth every 8 (eight) hours as needed for moderate pain (pain score 4-6), mild pain (pain score 1-3) or cramping. 30 tablet 3   insulin  aspart (NOVOLOG ) 100 UNIT/ML FlexPen Inject 10 Units into the skin 3 (three) times daily with meals. Please give 30 day supply (Patient taking differently: Inject 10 Units into the skin 2 (two) times daily with a meal. Please give 30 day supply) 15 mL 11   insulin  glargine (LANTUS ) 100 UNIT/ML Solostar Pen Inject 20 Units into the skin 2 (two) times daily. Take 20 units in the AM and 20 units in the PM. Space out by 12 hours. (Patient taking differently: Inject 10 Units into the skin every 12 (twelve) hours.) 15 mL 1   Insulin  Pen Needle 31G X 8 MM MISC Use as directed 200 each 1   isosorbide  mononitrate (IMDUR ) 60 MG 24 hr tablet Take 1 tablet (60 mg total) by mouth daily. 90 tablet 3   metFORMIN  (GLUCOPHAGE ) 1000 MG tablet Take 1,000 mg by mouth 2 (two) times daily.     ondansetron  (ZOFRAN -ODT) 4 MG disintegrating tablet Take 1 tablet (4 mg total) by mouth every 6 (six) hours as needed for nausea. 20 tablet 0   sertraline  (ZOLOFT ) 50 MG tablet Take  1 tablet (50 mg total) by mouth daily. 90 tablet 0   aspirin  EC 81 MG tablet Take 1 tablet (81 mg total) by mouth daily. Swallow whole. 120 tablet 12   Blood Pressure Monitoring (BLOOD PRESSURE CUFF) MISC Use as directed to monitor blood pressure (Patient not taking: Reported on 08/21/2024) 1 each 0   No current facility-administered medications for this visit.    Allergies: is allergic to morphine  and codeine, shellfish allergy, tuna [fish allergy], feraheme   [ferumoxytol ], fish protein-containing drug products, heparin , latex, and porcine (pork) protein-containing drug products.  Physical Exam:  BP (!) 140/75   Pulse 89   Wt 205 lb 11.2 oz (93.3 kg)   LMP 06/18/2024   BMI 37.62 kg/m  Body mass index is 37.62 kg/m. General appearance: Well nourished, well developed female in no acute distress.  Abdomen: diffusely non tender to palpation, non distended, and no masses, hernias Neuro/Psych:  Normal mood and affect.    Bedside u/s with obvious GS but thickened lining with some vascularity  Assessment: incomplete AB  Plan:  Recommend keeping d&c for tomorrow. Patient with significant cardiac history and has seen Dr. Sheena. Can touch base with her tomorrow re: when to restart plavix . Message sent to her and operating surgeon for tomorrow  Return in about 2 weeks (around 09/04/2024) for in person, md or app.  Future Appointments  Date Time Provider Department Center  08/27/2024  2:15 PM [facility] INTAKE [facility]  08/29/2024  2:20 PM Tobb, Kardie, DO [facility] None  09/19/2024 10:40 AM Tobb, Kardie, DO [facility]  09/29/2024  4:00 PM Tanda Bleacher, MD [facility] [address]    Bebe Izell Raddle MD Attending Center for [facility])
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
## 2024-08-22 ENCOUNTER — Encounter (HOSPITAL_COMMUNITY): Admission: RE | Disposition: A | Payer: Self-pay | Source: Home / Self Care | Attending: Family Medicine

## 2024-08-22 ENCOUNTER — Ambulatory Visit (HOSPITAL_COMMUNITY)
Admission: RE | Admit: 2024-08-22 | Discharge: 2024-08-22 | Disposition: A | Discharge status: Home / Self Care | Attending: Family Medicine | Admitting: Family Medicine

## 2024-08-22 ENCOUNTER — Encounter (HOSPITAL_COMMUNITY): Payer: Self-pay | Admitting: Physician Assistant

## 2024-08-22 ENCOUNTER — Encounter (HOSPITAL_COMMUNITY): Payer: Self-pay | Admitting: Family Medicine

## 2024-08-22 ENCOUNTER — Ambulatory Visit (HOSPITAL_COMMUNITY): Payer: Self-pay | Admitting: Physician Assistant

## 2024-08-22 DIAGNOSIS — O021 Missed abortion: Secondary | ICD-10-CM

## 2024-08-22 DIAGNOSIS — Z7984 Long term (current) use of oral hypoglycemic drugs: Secondary | ICD-10-CM | POA: Insufficient documentation

## 2024-08-22 DIAGNOSIS — O034 Incomplete spontaneous abortion without complication: Secondary | ICD-10-CM

## 2024-08-22 DIAGNOSIS — I10 Essential (primary) hypertension: Secondary | ICD-10-CM | POA: Diagnosis not present

## 2024-08-22 DIAGNOSIS — Z7982 Long term (current) use of aspirin: Secondary | ICD-10-CM | POA: Insufficient documentation

## 2024-08-22 DIAGNOSIS — Z955 Presence of coronary angioplasty implant and graft: Secondary | ICD-10-CM | POA: Insufficient documentation

## 2024-08-22 DIAGNOSIS — I252 Old myocardial infarction: Secondary | ICD-10-CM | POA: Insufficient documentation

## 2024-08-22 DIAGNOSIS — Z3A08 8 weeks gestation of pregnancy: Secondary | ICD-10-CM | POA: Diagnosis not present

## 2024-08-22 DIAGNOSIS — Z3A09 9 weeks gestation of pregnancy: Secondary | ICD-10-CM

## 2024-08-22 DIAGNOSIS — O0289 Other abnormal products of conception: Secondary | ICD-10-CM

## 2024-08-22 DIAGNOSIS — I2510 Atherosclerotic heart disease of native coronary artery without angina pectoris: Secondary | ICD-10-CM | POA: Diagnosis not present

## 2024-08-22 DIAGNOSIS — E119 Type 2 diabetes mellitus without complications: Secondary | ICD-10-CM | POA: Insufficient documentation

## 2024-08-22 DIAGNOSIS — Z794 Long term (current) use of insulin: Secondary | ICD-10-CM | POA: Insufficient documentation

## 2024-08-22 DIAGNOSIS — Z8673 Personal history of transient ischemic attack (TIA), and cerebral infarction without residual deficits: Secondary | ICD-10-CM | POA: Insufficient documentation

## 2024-08-22 DIAGNOSIS — Z79899 Other long term (current) drug therapy: Secondary | ICD-10-CM | POA: Insufficient documentation

## 2024-08-22 HISTORY — PX: DILATION AND EVACUATION: SHX1459

## 2024-08-22 LAB — BASIC METABOLIC PANEL WITH GFR
Anion gap: 13 (ref 5–15)
BUN: 23 mg/dL — ABNORMAL HIGH (ref 6–20)
CO2: 19 mmol/L — ABNORMAL LOW (ref 22–32)
Calcium: 8.5 mg/dL — ABNORMAL LOW (ref 8.9–10.3)
Chloride: 102 mmol/L (ref 98–111)
Creatinine, Ser: 1 mg/dL (ref 0.44–1.00)
GFR, Estimated: >60 mL/min (ref >60)
Glucose, Bld: 320 mg/dL — ABNORMAL HIGH (ref 70–99)
Potassium: 3.7 mmol/L (ref 3.5–5.1)
Sodium: 134 mmol/L — ABNORMAL LOW (ref 135–145)

## 2024-08-22 LAB — GLUCOSE, CAPILLARY
Glucose-Capillary: 352 mg/dL — ABNORMAL HIGH (ref 70–99)
Glucose-Capillary: 233 mg/dL — ABNORMAL HIGH (ref 70–99)
Glucose-Capillary: 200 mg/dL — ABNORMAL HIGH (ref 70–99)

## 2024-08-22 LAB — CBC
HCT: 28 % — ABNORMAL LOW (ref 36.0–46.0)
Hemoglobin: 9.3 g/dL — ABNORMAL LOW (ref 12.0–15.0)
MCH: 27 pg (ref 26.0–34.0)
MCHC: 33.2 g/dL (ref 30.0–36.0)
MCV: 81.4 fL (ref 80.0–100.0)
Platelets: 182 K/uL (ref 150–400)
RBC: 3.44 MIL/uL — ABNORMAL LOW (ref 3.87–5.11)
RDW: 14.4 % (ref 11.5–15.5)
WBC: 4.2 K/uL (ref 4.0–10.5)
nRBC: 0 % (ref 0.0–0.2)

## 2024-08-22 LAB — TYPE AND SCREEN
ABO/RH(D): O POS
Antibody Screen: NEGATIVE

## 2024-08-22 SURGERY — DILATION AND EVACUATION, UTERUS
Anesthesia: General | Site: "Vagina "

## 2024-08-22 MED ORDER — ACETAMINOPHEN 10 MG/ML IV SOLN
1000.0000 mg | Freq: Once | INTRAVENOUS | Status: DC | PRN
  Administered 2024-08-22: 1000 mg via INTRAVENOUS

## 2024-08-22 MED ORDER — ACETAMINOPHEN 10 MG/ML IV SOLN
INTRAVENOUS | Status: AC
  Filled 2024-08-22: qty 100

## 2024-08-22 MED ORDER — OXYCODONE HCL 5 MG/5ML PO SOLN: 5.0000 mg | Freq: Once | ORAL | Status: AC | PRN

## 2024-08-22 MED ORDER — ORAL CARE MOUTH RINSE
15.0000 mL | Freq: Once | OROMUCOSAL | Status: AC
Start: 2024-08-22 — End: 2024-08-22

## 2024-08-22 MED ORDER — INSULIN ASPART 100 UNIT/ML IJ SOLN
INTRAMUSCULAR | Status: AC
  Administered 2024-08-22: 10 [IU] via SUBCUTANEOUS
  Filled 2024-08-22: qty 10

## 2024-08-22 MED ORDER — FENTANYL CITRATE (PF) 100 MCG/2ML IJ SOLN
INTRAMUSCULAR | Status: AC
  Filled 2024-08-22: qty 2

## 2024-08-22 MED ORDER — BUPIVACAINE-EPINEPHRINE (PF) 0.25% -1:200000 IJ SOLN
INTRAMUSCULAR | Status: AC
Start: 2024-08-22 — End: 2024-08-22
  Filled 2024-08-22: qty 30

## 2024-08-22 MED ORDER — DROPERIDOL 2.5 MG/ML IJ SOLN
0.6250 mg | Freq: Once | INTRAMUSCULAR | Status: AC | PRN
Start: 2024-08-22 — End: 2024-08-22
  Administered 2024-08-22: 0.625 mg via INTRAVENOUS

## 2024-08-22 MED ORDER — OXYCODONE HCL 5 MG PO TABS
ORAL_TABLET | ORAL | Status: AC
  Filled 2024-08-22: qty 1

## 2024-08-22 MED ORDER — MISOPROSTOL 200 MCG PO TABS
ORAL_TABLET | ORAL | Status: AC
Start: 2024-08-22 — End: 2024-08-22
  Filled 2024-08-22: qty 5

## 2024-08-22 MED ORDER — POVIDONE-IODINE 10 % EX SWAB
2.0000 | Freq: Once | CUTANEOUS | Status: AC
  Administered 2024-08-22: 2 via TOPICAL

## 2024-08-22 MED ORDER — CHLORHEXIDINE GLUCONATE 0.12 % MT SOLN
15.0000 mL | Freq: Once | OROMUCOSAL | Status: AC
  Administered 2024-08-22: 15 mL via OROMUCOSAL
  Filled 2024-08-22: qty 15

## 2024-08-22 MED ORDER — CARVEDILOL 25 MG PO TABS
25.0000 mg | ORAL_TABLET | Freq: Once | ORAL | Status: AC
  Filled 2024-08-22: qty 1

## 2024-08-22 MED ORDER — FENTANYL CITRATE (PF) 100 MCG/2ML IJ SOLN: 25.0000 ug | INTRAMUSCULAR | Status: DC | PRN

## 2024-08-22 MED ORDER — FENTANYL CITRATE (PF) 50 MCG/ML IJ SOSY
25.0000 ug | PREFILLED_SYRINGE | Freq: Once | INTRAMUSCULAR | Status: AC
  Administered 2024-08-22: 25 ug via INTRAVENOUS
  Filled 2024-08-22: qty 0.5

## 2024-08-22 MED ORDER — MIDAZOLAM HCL (PF) 2 MG/2ML IJ SOLN
INTRAMUSCULAR | Status: DC | PRN
  Administered 2024-08-22: .5 mg via INTRAVENOUS

## 2024-08-22 MED ORDER — PROPOFOL 10 MG/ML IV BOLUS
INTRAVENOUS | Status: AC
Start: 2024-08-22 — End: 2024-08-22
  Filled 2024-08-22: qty 20

## 2024-08-22 MED ORDER — FENTANYL CITRATE (PF) 250 MCG/5ML IJ SOLN
INTRAMUSCULAR | Status: AC
  Filled 2024-08-22: qty 5

## 2024-08-22 MED ORDER — OXYCODONE HCL 5 MG PO TABS: 5.0000 mg | ORAL_TABLET | ORAL | 0 refills | Status: AC | PRN

## 2024-08-22 MED ORDER — DROPERIDOL 2.5 MG/ML IJ SOLN
INTRAMUSCULAR | Status: AC
Start: 2024-08-22 — End: 2024-08-22
  Filled 2024-08-22: qty 2

## 2024-08-22 MED ORDER — CARVEDILOL 12.5 MG PO TABS
ORAL_TABLET | ORAL | Status: AC
  Administered 2024-08-22: 25 mg via ORAL
  Filled 2024-08-22: qty 2

## 2024-08-22 MED ORDER — IBUPROFEN 800 MG PO TABS: 800.0000 mg | ORAL_TABLET | Freq: Three times a day (TID) | ORAL | 0 refills | Status: AC | PRN

## 2024-08-22 MED ORDER — OXYCODONE HCL 5 MG PO TABS
5.0000 mg | ORAL_TABLET | Freq: Once | ORAL | Status: AC | PRN
  Administered 2024-08-22: 5 mg via ORAL

## 2024-08-22 MED ORDER — SCOPOLAMINE 1 MG/3DAYS TD PT72
MEDICATED_PATCH | TRANSDERMAL | Status: AC
  Filled 2024-08-22: qty 1

## 2024-08-22 MED ORDER — FENTANYL CITRATE (PF) 250 MCG/5ML IJ SOLN
INTRAMUSCULAR | Status: DC | PRN
  Administered 2024-08-22: 25 ug via INTRAVENOUS
  Administered 2024-08-22: 50 ug via INTRAVENOUS

## 2024-08-22 MED ORDER — DEXAMETHASONE SOD PHOSPHATE PF 10 MG/ML IJ SOLN
INTRAMUSCULAR | Status: DC | PRN
  Administered 2024-08-22: 4 mg via INTRAVENOUS

## 2024-08-22 MED ORDER — SCOPOLAMINE 1 MG/3DAYS TD PT72
1.0000 | MEDICATED_PATCH | TRANSDERMAL | Status: DC
  Administered 2024-08-22: 1 mg via TRANSDERMAL

## 2024-08-22 MED ORDER — MIDAZOLAM HCL 2 MG/2ML IJ SOLN
INTRAMUSCULAR | Status: AC
Start: 2024-08-22 — End: 2024-08-22
  Filled 2024-08-22: qty 2

## 2024-08-22 MED ORDER — LACTATED RINGERS IV SOLN: INTRAVENOUS | Status: DC

## 2024-08-22 MED ORDER — PHENYLEPHRINE 80 MCG/ML (10ML) SYRINGE FOR IV PUSH (FOR BLOOD PRESSURE SUPPORT)
PREFILLED_SYRINGE | INTRAVENOUS | Status: DC | PRN
  Administered 2024-08-22 (×3): 40 ug via INTRAVENOUS

## 2024-08-22 MED ORDER — ONDANSETRON HCL 4 MG/2ML IJ SOLN
INTRAMUSCULAR | Status: DC | PRN
  Administered 2024-08-22: 4 mg via INTRAVENOUS

## 2024-08-22 MED ORDER — METHYLERGONOVINE MALEATE 0.2 MG/ML IJ SOLN
INTRAMUSCULAR | Status: AC
Start: 2024-08-22 — End: 2024-08-22
  Filled 2024-08-22: qty 1

## 2024-08-22 MED ORDER — INSULIN ASPART 100 UNIT/ML IJ SOLN
10.0000 [IU] | Freq: Once | INTRAMUSCULAR | Status: AC
  Filled 2024-08-22: qty 0.1

## 2024-08-22 SURGICAL SUPPLY — 12 items
GLOVE BIOGEL PI IND STRL 7.0 (GLOVE) ×3 IMPLANT
GLOVE BIOGEL PI IND STRL 7.5 (GLOVE) ×3 IMPLANT
GLOVE ECLIPSE 7.5 STRL STRAW (GLOVE) ×3 IMPLANT
GOWN STRL REUS W/TWL LRG LVL3 (GOWN DISPOSABLE) ×6 IMPLANT
KIT BERKELEY 1ST TRIMESTER 3/8 (MISCELLANEOUS) ×3 IMPLANT
PACK VAGINAL MINOR WOMEN LF (CUSTOM PROCEDURE TRAY) ×3 IMPLANT
PAD OB MATERNITY 4.3X12.25 (PERSONAL CARE ITEMS) ×3 IMPLANT
PAD PREP 24X48 CUFFED NSTRL (MISCELLANEOUS) ×3 IMPLANT
SET BERKELEY SUCTION TUBING (SUCTIONS) ×3 IMPLANT
SOLN 0.9% NACL POUR BTL 1000ML (IV SOLUTION) ×3 IMPLANT
TOWEL OR 17X24 6PK STRL BLUE (TOWEL DISPOSABLE) ×6 IMPLANT
VACURETTE 10 RIGID CVD (CANNULA) IMPLANT

## 2024-08-22 NOTE — Progress Notes (Signed)
 Pt's CBG 352. 10 units of Novolog  is given per dr. Tilford.  Pt's BP 166/77, HR 89. Per pt she did not have her Carvedilol  this am. Per dr. Tilford to give.

## 2024-08-22 NOTE — Anesthesia Procedure Notes (Signed)
 Procedure Name: LMA Insertion Date/Time: 08/22/2024 1:17 PM  Performed by: Vertie Arthea RAMAN, CRNAPre-anesthesia Checklist: Patient identified, Emergency Drugs available, Suction available and Patient being monitored Patient Re-evaluated:Patient Re-evaluated prior to induction Oxygen Delivery Method: Circle System Utilized Preoxygenation: Pre-oxygenation with 100% oxygen Induction Type: IV induction Ventilation: Mask ventilation without difficulty LMA: LMA inserted LMA Size: 4.0 Number of attempts: 1 Airway Equipment and Method: Bite block Placement Confirmation: positive ETCO2 Tube secured with: Tape Dental Injury: Teeth and Oropharynx as per pre-operative assessment

## 2024-08-22 NOTE — Transfer of Care (Signed)
 Immediate Anesthesia Transfer of Care Note  Patient: Joann Meyers  Procedure(s) Performed: DILATION AND EVACUATION, UTERUS (Vagina )  Patient Location: PACU  Anesthesia Type:General  Level of Consciousness: awake, alert , and oriented  Airway & Oxygen Therapy: Patient Spontanous Breathing and Patient connected to nasal cannula oxygen  Post-op Assessment: Report given to RN and Post -op Vital signs reviewed and stable  Post vital signs: Reviewed and stable  Last Vitals:  Vitals Value Taken Time  BP 144/76 08/22/24 14:00  Temp    Pulse 66 08/22/24 14:02  Resp 18 08/22/24 14:02  SpO2 97 % 08/22/24 14:02  Vitals shown include unfiled device data.  Last Pain:  Vitals:   08/22/24 1248  TempSrc:   PainSc: 10-Worst pain ever         Complications: No notable events documented.

## 2024-08-22 NOTE — Anesthesia Postprocedure Evaluation (Signed)
 Anesthesia Post Note  Patient: Joann Meyers  Procedure(s) Performed: DILATION AND EVACUATION, UTERUS (Vagina )     Patient location during evaluation: PACU Anesthesia Type: General Level of consciousness: awake and alert Pain management: pain level controlled Vital Signs Assessment: post-procedure vital signs reviewed and stable Respiratory status: spontaneous breathing, nonlabored ventilation, respiratory function stable and patient connected to nasal cannula oxygen Cardiovascular status: blood pressure returned to baseline and stable Postop Assessment: no apparent nausea or vomiting Anesthetic complications: no   No notable events documented.  Last Vitals:  Vitals:   08/22/24 1545 08/22/24 1600  BP: 112/72 110/74  Pulse: 61 60  Resp: 12 12  Temp:  36.9 C  SpO2: 95% 94%    Last Pain:  Vitals:   08/22/24 1600  TempSrc:   PainSc: Asleep                 Franky JONETTA Bald

## 2024-08-22 NOTE — Op Note (Signed)
 D&C Operative Note   08/22/2024  PRE-OP DIAGNOSIS: non-viable pregnancy, retained products of conception   POST-OP DIAGNOSIS: the same  SURGEON: Surgeons and Role:    * Lola Donnice HERO, MD - Primary  ASSISTANT: none  PROCEDURE:  Suction dilation and curettage  ANESTHESIA: general  ESTIMATED BLOOD LOSS: 100 mL  DRAINS: none  TOTAL IV FLUIDS: 250 cc  SPECIMENS: products of conception to pathology  VTE PROPHYLAXIS: SCDs to the bilateral lower extremities  ANTIBIOTICS: Doxycycline 200 mg IV  COMPLICATIONS: none  DISPOSITION: PACU - hemodynamically stable.  CONDITION: stable  BLOOD TYPE: O POS. Rhogam given:not applicable  FINDINGS: Exam under anesthesia revealed 8 week sized uterus with no masses and bilateral adnexa without masses or fullness. Necrotic appearing products of conception were seen, with gritty texture in all four quadrants.  PROCEDURE IN DETAIL:  After informed consent was obtained, the patient was taken to the operating room where anesthesia was obtained without difficulty. The patient was positioned in the dorsal lithotomy position in [place] stirrups. The patient was examined under anesthesia, with the above noted findings.  The bi-valved speculum was placed inside the patient's vagina, and the the anterior lip of the cervix was seen and grasped with the tenaculum. The cervix was progressively dilated to a 31 French-Pratt dilator.  The suction was then calibrated to and connected to the number 10 cannula, and three passes were made with progressively less tissue removed. A gentle curettage was done at the end and yield no products of conception. The suction was then done one more time to remove any remaining curettage material. Excellent hemostasis was noted, and all instruments were removed, with excellent hemostasis noted throughout.  She was then taken out of dorsal lithotomy. The patient tolerated the procedure well.  Sponge, lap and instrument counts  were correct x2.  The patient was taken to recovery room in excellent condition.   Donnice HERO Lola, MD, MPH, FAAFP Attending Family Medicine Physician, [facility] for [facility], [facility]
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
## 2024-08-23 ENCOUNTER — Encounter (HOSPITAL_COMMUNITY): Payer: Self-pay | Admitting: Family Medicine

## 2024-08-26 LAB — SURGICAL PATHOLOGY

## 2024-08-27 ENCOUNTER — Telehealth

## 2024-08-29 ENCOUNTER — Ambulatory Visit: Admitting: Cardiology

## 2024-09-16 ENCOUNTER — Encounter: Payer: Self-pay | Admitting: *Deleted

## 2024-09-18 ENCOUNTER — Other Ambulatory Visit: Payer: Self-pay

## 2024-09-18 ENCOUNTER — Encounter: Payer: Self-pay | Admitting: Obstetrics and Gynecology

## 2024-09-18 ENCOUNTER — Ambulatory Visit: Admitting: Obstetrics and Gynecology

## 2024-09-18 VITALS — BP 140/83 | HR 88 | Wt 204.8 lb

## 2024-09-18 DIAGNOSIS — R110 Nausea: Secondary | ICD-10-CM

## 2024-09-18 DIAGNOSIS — Z3201 Encounter for pregnancy test, result positive: Secondary | ICD-10-CM

## 2024-09-18 DIAGNOSIS — O034 Incomplete spontaneous abortion without complication: Secondary | ICD-10-CM
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
## 2024-09-19 ENCOUNTER — Ambulatory Visit: Attending: Cardiology | Admitting: Cardiology

## 2024-09-19 ENCOUNTER — Encounter: Payer: Self-pay | Admitting: Cardiology

## 2024-09-19 ENCOUNTER — Ambulatory Visit: Payer: Self-pay | Admitting: Obstetrics and Gynecology

## 2024-09-19 VITALS — BP 150/78 | HR 83 | Ht 62.0 in | Wt 206.6 lb

## 2024-09-19 DIAGNOSIS — E782 Mixed hyperlipidemia: Secondary | ICD-10-CM

## 2024-09-19 DIAGNOSIS — E119 Type 2 diabetes mellitus without complications: Secondary | ICD-10-CM

## 2024-09-19 DIAGNOSIS — I2510 Atherosclerotic heart disease of native coronary artery without angina pectoris: Secondary | ICD-10-CM

## 2024-09-19 DIAGNOSIS — I10 Essential (primary) hypertension: Secondary | ICD-10-CM

## 2024-09-19 LAB — BETA HCG QUANT (REF LAB): hCG Quant: 1 m[IU]/mL

## 2024-09-19 MED ORDER — ROSUVASTATIN CALCIUM 20 MG PO TABS: 20.0000 mg | ORAL_TABLET | Freq: Every day | ORAL | 3 refills | Status: AC

## 2024-09-19 MED ORDER — OLMESARTAN MEDOXOMIL 20 MG PO TABS: 20.0000 mg | ORAL_TABLET | Freq: Every day | ORAL | 3 refills | Status: AC

## 2024-09-19 NOTE — Patient Instructions (Addendum)
 Medication Instructions:  Your physician has recommended you make the following change in your medication:  START: Olmesartan  20 mg once daily  START: Crestor  20 mg once daily *If you need a refill on your cardiac medications before your next appointment, please call your pharmacy*   Follow-Up: At [facility], you and your health needs are our priority.  As part of our continuing mission to provide you with exceptional heart care, our providers are all part of one team.  This team includes your primary Cardiologist (physician) and Advanced Practice Providers or APPs (Physician Assistants and Nurse Practitioners) who all work together to provide you with the care you need, when you need it.  Your next appointment:   2-3 week(s)  Provider:   Lum Louis, NP or [place] SQUIBB then, Kardie Tobb, DO will plan to see you again in 12 week(s).
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
## 2024-09-29 ENCOUNTER — Encounter: Payer: Self-pay | Admitting: Family Medicine

## 2024-09-29 ENCOUNTER — Ambulatory Visit: Admitting: Family Medicine

## 2024-09-29 VITALS — BP 109/67 | HR 73 | Ht 62.0 in | Wt 208.0 lb

## 2024-09-29 DIAGNOSIS — I10 Essential (primary) hypertension: Secondary | ICD-10-CM

## 2024-09-29 DIAGNOSIS — E785 Hyperlipidemia, unspecified: Secondary | ICD-10-CM

## 2024-09-29 DIAGNOSIS — E1165 Type 2 diabetes mellitus with hyperglycemia: Secondary | ICD-10-CM

## 2024-09-29 LAB — POCT GLYCOSYLATED HEMOGLOBIN (HGB A1C): HbA1c, POC (controlled diabetic range): 9.5 % — AB (ref 0.0–7.0)

## 2024-09-29 MED ORDER — ASPIRIN 81 MG PO TBEC: 81.0000 mg | DELAYED_RELEASE_TABLET | Freq: Every day | ORAL | 12 refills | Status: AC

## 2024-09-30 ENCOUNTER — Ambulatory Visit: Payer: Self-pay | Admitting: Family Medicine

## 2024-10-01 ENCOUNTER — Encounter: Payer: Self-pay | Admitting: Family Medicine
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
## 2024-10-23 ENCOUNTER — Ambulatory Visit: Payer: Self-pay | Admitting: Pharmacist

## 2024-12-17 ENCOUNTER — Ambulatory Visit: Admitting: Cardiology

## 2025-01-28 ENCOUNTER — Ambulatory Visit: Payer: Self-pay | Admitting: Family Medicine
